# Patient Record
Sex: Female | Born: 1940 | Race: White | Hispanic: No | Marital: Married | Smoking: Former smoker
Health system: Southern US, Community
[De-identification: ages and names within clinical notes are randomized; demographics above are authoritative.]

## PROBLEM LIST (undated history)

## (undated) DIAGNOSIS — Z85819 Personal history of malignant neoplasm of unspecified site of lip, oral cavity, and pharynx: Secondary | ICD-10-CM

## (undated) DIAGNOSIS — T7840XA Allergy, unspecified, initial encounter: Secondary | ICD-10-CM

## (undated) DIAGNOSIS — M1710 Unilateral primary osteoarthritis, unspecified knee: Secondary | ICD-10-CM

## (undated) DIAGNOSIS — M1990 Unspecified osteoarthritis, unspecified site: Secondary | ICD-10-CM

## (undated) DIAGNOSIS — Z860100 Personal history of colon polyps, unspecified: Secondary | ICD-10-CM

## (undated) DIAGNOSIS — M179 Osteoarthritis of knee, unspecified: Secondary | ICD-10-CM

## (undated) DIAGNOSIS — Z8619 Personal history of other infectious and parasitic diseases: Secondary | ICD-10-CM

## (undated) DIAGNOSIS — C801 Malignant (primary) neoplasm, unspecified: Secondary | ICD-10-CM

## (undated) HISTORY — DX: Personal history of malignant neoplasm of unspecified site of lip, oral cavity, and pharynx: Z85.819

## (undated) HISTORY — DX: Personal history of other infectious and parasitic diseases: Z86.19

## (undated) HISTORY — DX: Unspecified osteoarthritis, unspecified site: M19.90

## (undated) HISTORY — PX: EYE SURGERY: SHX253

## (undated) HISTORY — DX: Personal history of colonic polyps: Z86.010

## (undated) HISTORY — PX: APPENDECTOMY: SHX54

## (undated) HISTORY — PX: TUBAL LIGATION: SHX77

## (undated) HISTORY — PX: DILATION AND CURETTAGE OF UTERUS: SHX78

## (undated) HISTORY — PX: JOINT REPLACEMENT: SHX530

## (undated) HISTORY — PX: KNEE ARTHROSCOPY W/ MENISCAL REPAIR: SHX1877

## (undated) HISTORY — PX: FRACTURE SURGERY: SHX138

## (undated) HISTORY — DX: Allergy, unspecified, initial encounter: T78.40XA

## (undated) HISTORY — DX: Personal history of colon polyps, unspecified: Z86.0100

---

## 1999-02-09 ENCOUNTER — Other Ambulatory Visit: Admission: RE | Admit: 1999-02-09 | Discharge: 1999-02-09 | Payer: Self-pay | Admitting: Obstetrics and Gynecology

## 2000-02-11 ENCOUNTER — Encounter: Payer: Self-pay | Admitting: Obstetrics and Gynecology

## 2000-02-11 ENCOUNTER — Encounter: Admission: RE | Admit: 2000-02-11 | Discharge: 2000-02-11 | Payer: Self-pay | Admitting: Obstetrics and Gynecology

## 2001-03-05 ENCOUNTER — Encounter: Admission: RE | Admit: 2001-03-05 | Discharge: 2001-03-05 | Payer: Self-pay | Admitting: Obstetrics and Gynecology

## 2001-03-05 ENCOUNTER — Encounter: Payer: Self-pay | Admitting: Obstetrics and Gynecology

## 2001-03-05 ENCOUNTER — Other Ambulatory Visit: Admission: RE | Admit: 2001-03-05 | Discharge: 2001-03-05 | Payer: Self-pay | Admitting: Obstetrics and Gynecology

## 2002-03-24 ENCOUNTER — Encounter: Payer: Self-pay | Admitting: Obstetrics and Gynecology

## 2002-03-24 ENCOUNTER — Other Ambulatory Visit: Admission: RE | Admit: 2002-03-24 | Discharge: 2002-03-24 | Payer: Self-pay | Admitting: Obstetrics and Gynecology

## 2002-03-24 ENCOUNTER — Encounter: Admission: RE | Admit: 2002-03-24 | Discharge: 2002-03-24 | Payer: Self-pay | Admitting: Obstetrics and Gynecology

## 2002-03-29 ENCOUNTER — Encounter: Admission: RE | Admit: 2002-03-29 | Discharge: 2002-03-29 | Payer: Self-pay | Admitting: Obstetrics and Gynecology

## 2002-03-29 ENCOUNTER — Encounter: Payer: Self-pay | Admitting: Obstetrics and Gynecology

## 2003-04-06 ENCOUNTER — Encounter: Payer: Self-pay | Admitting: Obstetrics and Gynecology

## 2003-04-06 ENCOUNTER — Encounter: Admission: RE | Admit: 2003-04-06 | Discharge: 2003-04-06 | Payer: Self-pay | Admitting: Obstetrics and Gynecology

## 2003-04-06 ENCOUNTER — Other Ambulatory Visit: Admission: RE | Admit: 2003-04-06 | Discharge: 2003-04-06 | Payer: Self-pay | Admitting: Obstetrics and Gynecology

## 2004-04-19 ENCOUNTER — Encounter: Admission: RE | Admit: 2004-04-19 | Discharge: 2004-04-19 | Payer: Self-pay | Admitting: Obstetrics and Gynecology

## 2004-06-08 ENCOUNTER — Encounter: Admission: RE | Admit: 2004-06-08 | Discharge: 2004-06-08 | Payer: Self-pay | Admitting: Obstetrics and Gynecology

## 2005-04-26 ENCOUNTER — Encounter: Admission: RE | Admit: 2005-04-26 | Discharge: 2005-04-26 | Payer: Self-pay | Admitting: Obstetrics and Gynecology

## 2006-05-02 ENCOUNTER — Encounter: Admission: RE | Admit: 2006-05-02 | Discharge: 2006-05-02 | Payer: Self-pay | Admitting: Obstetrics and Gynecology

## 2007-05-19 ENCOUNTER — Encounter: Admission: RE | Admit: 2007-05-19 | Discharge: 2007-05-19 | Payer: Self-pay | Admitting: Obstetrics and Gynecology

## 2008-05-25 ENCOUNTER — Encounter: Admission: RE | Admit: 2008-05-25 | Discharge: 2008-05-25 | Payer: Self-pay | Admitting: Obstetrics and Gynecology

## 2008-06-10 ENCOUNTER — Encounter: Admission: RE | Admit: 2008-06-10 | Discharge: 2008-06-10 | Payer: Self-pay | Admitting: Obstetrics and Gynecology

## 2009-06-09 ENCOUNTER — Encounter: Admission: RE | Admit: 2009-06-09 | Discharge: 2009-06-09 | Payer: Self-pay | Admitting: Obstetrics and Gynecology

## 2010-06-12 ENCOUNTER — Encounter: Admission: RE | Admit: 2010-06-12 | Discharge: 2010-06-12 | Payer: Self-pay | Admitting: Obstetrics and Gynecology

## 2010-06-15 ENCOUNTER — Ambulatory Visit (HOSPITAL_COMMUNITY): Admission: RE | Admit: 2010-06-15 | Discharge: 2010-06-15 | Payer: Self-pay | Admitting: Emergency Medicine

## 2010-11-26 ENCOUNTER — Other Ambulatory Visit: Payer: Self-pay

## 2010-11-26 DIAGNOSIS — D499 Neoplasm of unspecified behavior of unspecified site: Secondary | ICD-10-CM

## 2010-11-28 ENCOUNTER — Ambulatory Visit
Admission: RE | Admit: 2010-11-28 | Discharge: 2010-11-28 | Disposition: A | Discharge status: Home / Self Care | Payer: Medicare Other | Source: Ambulatory Visit

## 2010-11-28 DIAGNOSIS — D499 Neoplasm of unspecified behavior of unspecified site: Secondary | ICD-10-CM

## 2010-11-28 MED ORDER — IOHEXOL 300 MG/ML  SOLN
75.0000 mL | Freq: Once | INTRAMUSCULAR | Status: AC | PRN
  Administered 2010-11-28: 75 mL via INTRAVENOUS

## 2010-12-06 HISTORY — PX: EXCISION OF ORAL TUMOR: SHX6269

## 2010-12-18 ENCOUNTER — Encounter (HOSPITAL_COMMUNITY)
Admission: RE | Admit: 2010-12-18 | Discharge: 2010-12-18 | Disposition: A | Discharge status: Home / Self Care | Payer: Medicare Other | Source: Ambulatory Visit | Attending: Otolaryngology | Admitting: Otolaryngology

## 2010-12-18 DIAGNOSIS — Z01812 Encounter for preprocedural laboratory examination: Secondary | ICD-10-CM | POA: Insufficient documentation

## 2010-12-18 LAB — BASIC METABOLIC PANEL
BUN: 13 mg/dL (ref 6–23)
Chloride: 103 mEq/L (ref 96–112)
GFR calc Af Amer: >60 mL/min (ref >60)
GFR calc non Af Amer: >60 mL/min (ref >60)
Potassium: 4.1 mEq/L (ref 3.5–5.1)

## 2010-12-18 LAB — CBC
HCT: 40.8 % (ref 36.0–46.0)
Hemoglobin: 13.7 g/dL (ref 12.0–15.0)
MCHC: 33.6 g/dL (ref 30.0–36.0)
MCV: 87.6 fL (ref 78.0–100.0)

## 2010-12-18 LAB — SURGICAL PCR SCREEN: Staphylococcus aureus: NEGATIVE

## 2010-12-20 LAB — POCT I-STAT, CHEM 8
Calcium, Ion: 1.19 mmol/L (ref 1.12–1.32)
Creatinine, Ser: 0.8 mg/dL (ref 0.4–1.2)
HCT: 49 % — ABNORMAL HIGH (ref 36.0–46.0)
Potassium: 3.4 mEq/L — ABNORMAL LOW (ref 3.5–5.1)
Sodium: 139 mEq/L (ref 135–145)
TCO2: 29 mmol/L (ref 0–100)

## 2010-12-26 ENCOUNTER — Observation Stay (HOSPITAL_COMMUNITY)
Admission: RE | Admit: 2010-12-26 | Discharge: 2010-12-27 | Disposition: A | Discharge status: Home / Self Care | Payer: Medicare Other | Source: Ambulatory Visit | Attending: Otolaryngology | Admitting: Otolaryngology

## 2010-12-26 ENCOUNTER — Other Ambulatory Visit: Payer: Self-pay | Admitting: Otolaryngology

## 2010-12-26 DIAGNOSIS — Z87891 Personal history of nicotine dependence: Secondary | ICD-10-CM | POA: Insufficient documentation

## 2010-12-26 DIAGNOSIS — Z01812 Encounter for preprocedural laboratory examination: Secondary | ICD-10-CM | POA: Insufficient documentation

## 2010-12-26 DIAGNOSIS — C030 Malignant neoplasm of upper gum: Principal | ICD-10-CM | POA: Insufficient documentation

## 2011-01-04 ENCOUNTER — Observation Stay (HOSPITAL_COMMUNITY)
Admission: RE | Admit: 2011-01-04 | Discharge: 2011-01-04 | Disposition: A | Discharge status: Home / Self Care | Payer: Medicare Other | Source: Ambulatory Visit | Attending: Otolaryngology | Admitting: Otolaryngology

## 2011-01-04 ENCOUNTER — Other Ambulatory Visit: Payer: Self-pay | Admitting: Otolaryngology

## 2011-01-04 DIAGNOSIS — Z87891 Personal history of nicotine dependence: Secondary | ICD-10-CM | POA: Insufficient documentation

## 2011-01-04 DIAGNOSIS — Z01812 Encounter for preprocedural laboratory examination: Secondary | ICD-10-CM | POA: Insufficient documentation

## 2011-01-04 DIAGNOSIS — C030 Malignant neoplasm of upper gum: Principal | ICD-10-CM | POA: Insufficient documentation

## 2011-01-04 LAB — CBC
HCT: 38.9 % (ref 36.0–46.0)
Hemoglobin: 13.2 g/dL (ref 12.0–15.0)
Platelets: 161 10*3/uL (ref 150–400)
RBC: 4.41 MIL/uL (ref 3.87–5.11)
RDW: 12.6 % (ref 11.5–15.5)

## 2011-02-04 NOTE — Op Note (Signed)
[name], [name]                ACCOUNT NO.:  [id number]  MEDICAL RECORD NO.:  [id number]           PATIENT TYPE:  O  LOCATION:  SDSC                         FACILITY:  [facility]  PHYSICIAN:  Kinnie Scales. Annalee Genta, M.D.DATE OF BIRTH:  20-Oct-1940  DATE OF PROCEDURE:  01/04/2011 DATE OF DISCHARGE:                              OPERATIVE REPORT   PREOPERATIVE DIAGNOSES: 1. Right maxillary alveolar carcinoma. 2. Status post incomplete excision.  POSTOPERATIVE DIAGNOSES: 1. Right maxillary alveolar carcinoma. 2. Status post incomplete excision.  INDICATION FOR SURGERY: 1. Right maxillary alveolar carcinoma. 2. Status post incomplete excision.  SURGICAL PROCEDURE:  Excision, right alveolar carcinoma.  ANESTHESIA:  General endotracheal.  COMPLICATIONS:  None.  ESTIMATED BLOOD LOSS:  Approximately 50 mL.  The patient was transferred from the operating room to the recovery room in stable condition.  BRIEF HISTORY:  The patient is an otherwise healthy 70 year old white female who is a nonsmoker who noted a soft tissue mass involving the right maxillary alveolus underlying her upper denture.  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  Concerns were raised regarding possible squamous cell carcinoma and given her history and physical findings, CT scan was obtained, which showed no evidence of bone erosion.  I recommended to undertake excision of the mass under general anesthesia, which was performed on December 26, 2010.  The patient was stable and doing well postoperatively.  The pathology for resected tissue showed positive margins and given her history and examination with a final diagnosis of squamous cell carcinoma, I recommended that we undertake re-excision of the previous resection site under general anesthesia.  The risks, benefits, and possible complications of the procedure were discussed in detail with the patient and her husband. They understood and concurred to  our plan for surgery, which was scheduled on January 04, 2011.  PROCEDURE:  The patient was brought to the operating room at [facility], placed in supine position on the operating table.  General endotracheal anesthesia was established without difficulty.  When the patient was adequately anesthetized, she was positioned on the operating table, and prepped and draped in the sterile fashion.  Oral cavity was examined and the previous excision site was carefully visualized.  There was early healing after approximately 9 days.  There was no evidence of additional ulcer or tumor at the margins on gross visual inspection. Wide excision around the area was then undertaken with approximately 1 cm margins around the entire excision site.  The margins were then sent to Pathology for frozen section analysis, which was negative for any evidence of carcinoma.  The patient's wound was thoroughly irrigated and hemostasis was obtained with Bovie electrocautery.  The previously elevated buccal flap was taken down and readvanced and sutured into position with 4-0 Vicryl suture in interrupted horizontal mattress fashion as well as interrupted simple sutures along the margin.  The patient's oral cavity was then irrigated and suctioned.  Orogastric tube was passed.  She was awakened from anesthetic, extubated, and then transferred from the operating room to the recovery room in stable condition.  There were no complications and blood loss  was approximately 50 mL.          ______________________________ Kinnie Scales. Annalee Genta, M.D.     DLS/MEDQ  D:  04/54/0981  T:  01/04/2011  Job:  [id number]  Electronically Signed by Osborn Coho M.D. on 02/04/2011 10:18:09 AM

## 2011-02-04 NOTE — Op Note (Signed)
NAMEBENNIE, [name]                ACCOUNT NO.:  [id number]  MEDICAL RECORD NO.:  [id number]           PATIENT TYPE:  O  LOCATION:  5114                         FACILITY:  [facility]  PHYSICIAN:  Kinnie Scales. Annalee Genta, M.D.DATE OF BIRTH:  31-May-1941  DATE OF PROCEDURE:  12/26/2010 DATE OF DISCHARGE:                              OPERATIVE REPORT   REFERRING PHYSICIAN:  Gwendlyn Deutscher, DDS  LOCATION:  [facility] OR.  PREOPERATIVE DIAGNOSIS:  Right maxillary alveolar lesion.  POSTOPERATIVE DIAGNOSIS:  Right maxillary alveolar lesion.  INDICATIONS FOR SURGERY:  Right maxillary alveolar lesion.  SURGICAL PROCEDURES: 1. A 2 x 4-cm soft tissue resection of alveolar lesion. 2. Right maxillary alveoloplasty. 3. Local buccal rotation flap.  SURGEON:  Kinnie Scales. Annalee Genta, MD  ANESTHESIA:  General.  COMPLICATIONS:  None.  ESTIMATED BLOOD LOSS:  Minimal.  The patient was transferred from the operating room to recovery room in stable condition.  BRIEF HISTORY:  The patient is a 70 year old white female referred by Dr. Manson Passey on the Oral Surgery Service for evaluation of a soft tissue lesion involving the right maxillary alveolar mucosa.  The patient had undergone complete dental extraction and wore an upper and lower denture plate.  Under the upper denture along the alveolar ridge, the patient was noted to have a 1-cm ulcerated-appearing mass.  Biopsy of the mass was consistent with dysplastic changes.  No evidence of invasive carcinoma.  Given the patient's history, examination, and physical findings, CT scan of the area was obtained.  There is no evidence of bone erosion and no lymphadenopathy.  Given her history and findings, I recommended wide local excision of the lesion under anesthesia with reconstruction.  The risks, benefits, and possible complications of the procedure were discussed in detail with the patient and her family. They understood and concurred with our  plan for surgery which was scheduled at [facility] OR with intended overnight observation.  PROCEDURE:  The patient was brought to the operating room on December 26, 2010, placed in supine position on the operating table.  General endotracheal anesthesia was established without difficulty.  When the patient was adequately anesthetized, she was positioned on the operating table and prepped and draped in sterile fashion.  She was injected with local anesthetic consisting of 1% lidocaine and 1:100,000 solution of epinephrine which was injected in the oral cavity at the proposed excision site as well as in the right postauricular crease with intention of a possible full-thickness skin graft harvested from the postauricular sulcus.  The patient was then prepped and draped in a sterile fashion, positioned, and surgery proceeded.  With the patient prepared for surgery, the oral cavity was inspected. An area of 0.5-cm demarcation around the mucosal lesion was carefully inscribed in the mucosa.  Then using Bovie electrocautery, resection was undertaken creating an approximately 2 x 4-cm defect along the right alveolus.  The overlying soft tissue, including the lesion, was then resected in its entirety and marked and then sent to Pathology for gross microscopic evaluation.  The underlying alveolus was then carefully inspected.  There was an area of bony  dehiscence with intact underlying maxillary sinus mucosa.  Using a diamond bur on a dental drill, an alveoloplasty was then performed.  The alveolar ridge was taken down from anterior to posterior in order to allow closure and to remove bone underlying the lesion to reduce the risk of possible recurrent tumor. With the bony ridge removed, a Kerrison rongeur was then used to remove a small amount of underlying the bone adjacent to the dehiscing aspect of the maxilla.  There is no evidence of bone erosion or abnormal bony tissue in this  area but given the overlying lesion, I opted to remove an additional portion of bone.  With the wound thoroughly dissected and area of concern addressed, a local regional flap was then carefully created, the periosteum overlying the alveolus was elevated with a Therapist, nutritional on the lateral superior buccal wall at the buccal sulcus.  A soft tissue flap was developed using Bovie electrocautery and blunt and sharp dissection.  The flap was then rotated into position with closure beginning with interrupted 3-0 Vicryl suture on a tapered needle to create horizontal mattressing sutures to approximate the medial alveolar mucosa to the buccal flap.  The skin edges were then closed with interrupted 3-0 Vicryl suture along the same closure line creating excellent closure of the defect as well as protecting the area of maxillary bony dehiscence.  The patient's oral cavity and oropharynx were irrigated and suctioned.  Orogastric tube was passed, stomach contents aspirated.  She was then awakened from her anesthetic, extubated, and was transferred from the operating room to recovery room in stable condition.  There were no complications and blood loss was minimal.          ______________________________ Kinnie Scales. Annalee Genta, M.D.     DLS/MEDQ  D:  95/62/1308  T:  12/27/2010  Job:  [id number]  cc:   Grant Ruts., D.D.S.  Electronically Signed by Osborn Coho M.D. on 02/04/2011 10:17:37 AM

## 2011-05-20 ENCOUNTER — Other Ambulatory Visit: Payer: Self-pay | Admitting: Obstetrics and Gynecology

## 2011-05-20 DIAGNOSIS — Z1231 Encounter for screening mammogram for malignant neoplasm of breast: Secondary | ICD-10-CM

## 2011-06-21 ENCOUNTER — Ambulatory Visit
Admission: RE | Admit: 2011-06-21 | Discharge: 2011-06-21 | Disposition: A | Discharge status: Home / Self Care | Payer: Medicare Other | Source: Ambulatory Visit | Attending: Obstetrics and Gynecology | Admitting: Obstetrics and Gynecology

## 2011-06-21 DIAGNOSIS — Z1231 Encounter for screening mammogram for malignant neoplasm of breast: Secondary | ICD-10-CM

## 2011-06-28 NOTE — Discharge Summary (Signed)
  NAMEVILMA, [name]                ACCOUNT NO.:  [id number]  MEDICAL RECORD NO.:  [id number]  LOCATION:  5125                         FACILITY:  [facility]  PHYSICIAN:  Kinnie Scales. Annalee Genta, M.D.DATE OF BIRTH:  [birth year]  DATE OF ADMISSION:  01/04/2011 DATE OF DISCHARGE:  01/04/2011                              DISCHARGE SUMMARY   ADMISSION DIAGNOSIS:  Right maxillary alveolar squamous cell carcinoma.  DISCHARGE DIAGNOSIS:  Right maxillary alveolar squamous cell carcinoma.  SURGICAL PROCEDURE:  Re-excision of right maxillary alveolar squamous cell carcinoma.  CONDITION ON DISCHARGE:  The patient is discharged home in stable condition.  DISCHARGE MEDICATIONS:  As indicated.  FOLLOWUP:  The patient [name] follow up as an outpatient in 10 days for recheck.  BRIEF HISTORY:  The patient is a 70 year old female who underwent excision of a right maxillary alveolar mass on December 26, 2010. Pathology for resected tissue showed invasive squamous cell carcinoma and there were concerns regarding positive margins.  Given the patient's history and examination, I recommended repeating the surgical excision with wider margins and local reconstruction.  Risks, benefits, and possible complications of these procedures were discussed and the plan was to perform the surgery, observe the patient overnight, and discharge the following day.  HOSPITAL COURSE:  The patient was admitted to the ENT service under Dr. Thurmon Fair care on December 25, 2010, taken to the operating room and a wide local excision of the previous surgical site was undertaken.  She tolerated the surgical procedure without difficulty and was transferred from the operating room to the recovery room and from the recovery to unit 5100 for postoperative care.  The patient was stable on the first postoperative morning tolerating oral diet and medications and was discharged home in stable condition.  No complications.  No blood loss. The  patient was stable.          ______________________________ Kinnie Scales. Annalee Genta, M.D.     DLS/MEDQ  D:  16/07/9603  T:  04/30/2011  Job:  [id number]  Electronically Signed by Osborn Coho M.D. on 06/28/2011 12:05:05 PM

## 2011-09-16 ENCOUNTER — Ambulatory Visit (INDEPENDENT_AMBULATORY_CARE_PROVIDER_SITE_OTHER): Payer: Medicare Other

## 2011-09-16 DIAGNOSIS — R059 Cough, unspecified: Secondary | ICD-10-CM

## 2011-09-16 DIAGNOSIS — R05 Cough: Secondary | ICD-10-CM

## 2011-09-16 DIAGNOSIS — J40 Bronchitis, not specified as acute or chronic: Secondary | ICD-10-CM

## 2011-09-23 ENCOUNTER — Ambulatory Visit (INDEPENDENT_AMBULATORY_CARE_PROVIDER_SITE_OTHER): Payer: Medicare Other

## 2011-09-23 DIAGNOSIS — J40 Bronchitis, not specified as acute or chronic: Secondary | ICD-10-CM

## 2011-09-23 DIAGNOSIS — J329 Chronic sinusitis, unspecified: Secondary | ICD-10-CM

## 2011-09-23 DIAGNOSIS — H6120 Impacted cerumen, unspecified ear: Secondary | ICD-10-CM

## 2011-12-14 ENCOUNTER — Ambulatory Visit (INDEPENDENT_AMBULATORY_CARE_PROVIDER_SITE_OTHER): Payer: Medicare Other | Admitting: Family Medicine

## 2011-12-14 VITALS — BP 133/78 | HR 73 | Temp 98.1°F | Resp 16 | Ht 61.5 in | Wt 168.0 lb

## 2011-12-14 DIAGNOSIS — R05 Cough: Secondary | ICD-10-CM

## 2011-12-14 DIAGNOSIS — R059 Cough, unspecified: Secondary | ICD-10-CM

## 2011-12-14 MED ORDER — PREDNISONE 20 MG PO TABS: ORAL_TABLET | ORAL | Status: AC

## 2011-12-14 MED ORDER — HYDROCODONE-HOMATROPINE 5-1.5 MG/5ML PO SYRP: 5.0000 mL | ORAL_SOLUTION | Freq: Three times a day (TID) | ORAL | Status: AC | PRN

## 2011-12-14 NOTE — Progress Notes (Signed)
  Subjective:    Patient ID: [name], female    DOB: 1941-05-11, 71 y.o.   MRN: [id number]  HPI 71 yo female presenting with cough for 1 week.  Worsening.  Dry.  Feels tickly throat.  Some nasal discharge/stuffiness - clear.  No ear pain, no fever.  Cough same day and night.  Does keep her awake some.     Review of Systems    Negative except as per HPI  Objective:   Physical Exam  Constitutional: She appears well-developed. No distress.  HENT:  Right Ear: Tympanic membrane, external ear and ear canal normal. Tympanic membrane is not injected, not scarred, not perforated, not erythematous, not retracted and not bulging.  Left Ear: Tympanic membrane, external ear and ear canal normal. Tympanic membrane is not injected, not scarred, not perforated, not erythematous, not retracted and not bulging.  Nose: No mucosal edema or rhinorrhea. Right sinus exhibits no maxillary sinus tenderness and no frontal sinus tenderness. Left sinus exhibits no maxillary sinus tenderness and no frontal sinus tenderness.  Mouth/Throat: Uvula is midline, oropharynx is clear and moist and mucous membranes are normal. No oropharyngeal exudate or tonsillar abscesses.  Cardiovascular: Normal rate, regular rhythm, normal heart sounds and intact distal pulses.   No murmur heard. Pulmonary/Chest: Effort normal and breath sounds normal. No respiratory distress. She has no wheezes. She has no rales.  Lymphadenopathy:       Head (right side): No submandibular and no preauricular adenopathy present.       Head (left side): No submandibular and no preauricular adenopathy present.       Right cervical: No superficial cervical and no posterior cervical adenopathy present.      Left cervical: No superficial cervical and no posterior cervical adenopathy present.       Right: No supraclavicular adenopathy present.       Left: No supraclavicular adenopathy present.  Skin: Skin is warm and dry.          Assessment &  Plan:  Cough/early bronchitis - pred taper and hycodan.

## 2011-12-22 ENCOUNTER — Ambulatory Visit (INDEPENDENT_AMBULATORY_CARE_PROVIDER_SITE_OTHER): Payer: Medicare Other | Admitting: Family Medicine

## 2011-12-22 VITALS — BP 142/77 | HR 79 | Temp 98.0°F | Resp 16 | Ht 61.5 in | Wt 168.0 lb

## 2011-12-22 DIAGNOSIS — J309 Allergic rhinitis, unspecified: Secondary | ICD-10-CM | POA: Insufficient documentation

## 2011-12-22 DIAGNOSIS — J3489 Other specified disorders of nose and nasal sinuses: Secondary | ICD-10-CM

## 2011-12-22 DIAGNOSIS — R0981 Nasal congestion: Secondary | ICD-10-CM

## 2011-12-22 DIAGNOSIS — N393 Stress incontinence (female) (male): Secondary | ICD-10-CM

## 2011-12-22 MED ORDER — MOMETASONE FURO-FORMOTEROL FUM 200-5 MCG/ACT IN AERO: 2.0000 | INHALATION_SPRAY | Freq: Two times a day (BID) | RESPIRATORY_TRACT | Status: DC

## 2011-12-22 MED ORDER — ESOMEPRAZOLE MAGNESIUM 40 MG PO CPDR: 40.0000 mg | DELAYED_RELEASE_CAPSULE | Freq: Every day | ORAL | Status: DC

## 2011-12-22 MED ORDER — HYDROCODONE-HOMATROPINE 5-1.5 MG/5ML PO SYRP: 5.0000 mL | ORAL_SOLUTION | Freq: Three times a day (TID) | ORAL | Status: DC | PRN

## 2011-12-22 NOTE — Progress Notes (Signed)
71 yo woman with recurrent dry hacky cough last December, and again last week.  It improved a bit with the prednisone (which keeps her awake), but the symptoms are coming back again.  Cough is nonproductive.  No wheezing or h/o asthma.  Remote h/o smoking many years ago.  1 year ago she had an oral cancer removed last March 2012 (Dr. 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   HEENT:  Edentulous upper, normal post pharynx Neck:  No adenopathy or thyromeg, supple Chest:  Clear to auscultation   A: persistent cough  P:  Nexium 40 qd x 7 Dulera 200 mg bid Hydromet q6 h prn

## 2011-12-24 ENCOUNTER — Other Ambulatory Visit: Payer: Self-pay

## 2011-12-24 MED ORDER — ESOMEPRAZOLE MAGNESIUM 40 MG PO CPDR: 40.0000 mg | DELAYED_RELEASE_CAPSULE | Freq: Every day | ORAL | Status: DC

## 2011-12-27 ENCOUNTER — Telehealth: Payer: Self-pay

## 2011-12-27 NOTE — Telephone Encounter (Signed)
Pt states she has white spots in her mouth. Is it from the medicines?  Please call

## 2011-12-28 ENCOUNTER — Ambulatory Visit (INDEPENDENT_AMBULATORY_CARE_PROVIDER_SITE_OTHER): Payer: Medicare Other | Admitting: Family Medicine

## 2011-12-28 VITALS — BP 157/81 | HR 82 | Temp 98.6°F | Resp 20 | Ht 61.5 in | Wt 170.0 lb

## 2011-12-28 DIAGNOSIS — B370 Candidal stomatitis: Secondary | ICD-10-CM

## 2011-12-28 MED ORDER — NYSTATIN 100000 UNIT/ML MT SUSP: 500000.0000 [IU] | Freq: Four times a day (QID) | OROMUCOSAL | Status: AC

## 2011-12-28 NOTE — Progress Notes (Signed)
  Subjective:    Patient ID: [name], female    DOB: 1941/05/20, 71 y.o.   MRN: [id number]  HPI 71yo female with mouth sores. Thursday felt rough, yesterday noticed white spots.  They are sore.  No pain to swallow.  Using dulera BID but not rinsing mouth.    Seen 3/9 and 3/17 for cough.  First visit given pred taper and hycodan.  Second given dulera and hydromet. Also, Nexium.  Cough is better   Review of Systems Negative except as per HPI     Objective:   Physical Exam  Constitutional: She appears well-developed.  Pulmonary/Chest: Effort normal.  Neurological: She is alert.   Tongue and oral mucosa with white plaques.  Can be scraped with tongue depressor.        Assessment & Plan:  Thrush from inhaled corticosteroid.  Nystation solution.  Rinse and spit after dulera use.

## 2011-12-28 NOTE — Telephone Encounter (Signed)
PT CAME IN TO BE SEEN °

## 2011-12-31 ENCOUNTER — Other Ambulatory Visit: Payer: Self-pay | Admitting: Family Medicine

## 2011-12-31 NOTE — Telephone Encounter (Signed)
Rx called into [facility] in [place], [place]

## 2011-12-31 NOTE — Telephone Encounter (Signed)
Pts husband called stating pt is not getting any better with her bronchitis. States every time she finishes her cough syrup (hydrocodone) the cough comes back. States she coughs a lot at night. States he's called [facility] multiple times today to get a refill sent to [place] and doesn't believe they are sending it. I explained our refill policy to pts husband based on our flow of pts and he states she needs something today. I offered our hours for re-evaluation if shes not getting any better.   [facility] in [place] on [address]  [phone]    [phone]  bf

## 2012-04-30 ENCOUNTER — Encounter: Payer: Self-pay | Admitting: Emergency Medicine

## 2012-05-13 ENCOUNTER — Other Ambulatory Visit: Payer: Self-pay | Admitting: Obstetrics and Gynecology

## 2012-05-13 DIAGNOSIS — Z1231 Encounter for screening mammogram for malignant neoplasm of breast: Secondary | ICD-10-CM

## 2012-05-13 DIAGNOSIS — Z780 Asymptomatic menopausal state: Secondary | ICD-10-CM

## 2012-05-23 ENCOUNTER — Ambulatory Visit (INDEPENDENT_AMBULATORY_CARE_PROVIDER_SITE_OTHER): Payer: Medicare Other | Admitting: Emergency Medicine

## 2012-05-23 VITALS — BP 154/80 | HR 67 | Temp 97.5°F | Resp 18 | Ht 61.75 in | Wt 170.6 lb

## 2012-05-23 DIAGNOSIS — IMO0001 Reserved for inherently not codable concepts without codable children: Secondary | ICD-10-CM

## 2012-05-23 DIAGNOSIS — K219 Gastro-esophageal reflux disease without esophagitis: Secondary | ICD-10-CM

## 2012-05-23 DIAGNOSIS — J309 Allergic rhinitis, unspecified: Secondary | ICD-10-CM

## 2012-05-23 DIAGNOSIS — N3281 Overactive bladder: Secondary | ICD-10-CM

## 2012-05-23 DIAGNOSIS — Z9109 Other allergy status, other than to drugs and biological substances: Secondary | ICD-10-CM

## 2012-05-23 DIAGNOSIS — N318 Other neuromuscular dysfunction of bladder: Secondary | ICD-10-CM

## 2012-05-23 DIAGNOSIS — B351 Tinea unguium: Secondary | ICD-10-CM

## 2012-05-23 LAB — POCT CBC
Granulocyte percent: 56.7 %G (ref 37–80)
MID (cbc): 0.4 (ref 0–0.9)
MPV: 8.1 fL (ref 0–99.8)
POC MID %: 7.3 %M (ref 0–12)
Platelet Count, POC: 211 10*3/uL (ref 142–424)
RBC: 4.95 M/uL (ref 4.04–5.48)

## 2012-05-23 LAB — LIPID PANEL
HDL: 54 mg/dL (ref >39)
LDL Cholesterol: 140 mg/dL — ABNORMAL HIGH (ref 0–99)

## 2012-05-23 LAB — COMPREHENSIVE METABOLIC PANEL
ALT: 24 U/L (ref 0–35)
AST: 22 U/L (ref 0–37)
Albumin: 4.7 g/dL (ref 3.5–5.2)
Calcium: 10.2 mg/dL (ref 8.4–10.5)
Chloride: 102 mEq/L (ref 96–112)
Creat: 0.67 mg/dL (ref 0.50–1.10)
Potassium: 4.1 mEq/L (ref 3.5–5.3)
Sodium: 140 mEq/L (ref 135–145)
Total Protein: 7.3 g/dL (ref 6.0–8.3)

## 2012-05-23 MED ORDER — CICLOPIROX 8 % EX SOLN: Freq: Every day | CUTANEOUS | Status: AC

## 2012-05-23 NOTE — Progress Notes (Signed)
  Subjective:    Patient ID: [name], female    DOB: 11/20/1940, 71 y.o.   MRN: [id number]  HPI patient enters with problems with both her great toenail is ingrown on the past few months. History this patient is a history of alveolar carcinoma. She'll surgery by Dr. Annalee Genta and has been having regular checks for this. He underwent colonoscopy by Dr. Audley Hose in 2011 and from the report it appears she needs repeat colonoscopy in 5 years.    Review of Systems     Objective:   Physical Exam  Constitutional: She appears well-developed.  HENT:  Head: Atraumatic.  Eyes: Pupils are equal, round, and reactive to light.  Neck: No thyromegaly present.  Cardiovascular: Normal rate and regular rhythm.   Pulmonary/Chest: Effort normal and breath sounds normal. No respiratory distress. She has no wheezes.  Abdominal: Soft. She exhibits no distension. There is no tenderness. There is no rebound.   Results for orders placed in visit on 05/23/12  POCT SKIN KOH      Component Value Range   Skin KOH, POC Negative    POCT CBC      Component Value Range   WBC 5.8  4.6 - 10.2 K/uL   Lymph, poc 2.1  0.6 - 3.4   POC LYMPH PERCENT 36.0  10 - 50 %L   MID (cbc) 0.4  0 - 0.9   POC MID % 7.3  0 - 12 %M   POC Granulocyte 3.3  2 - 6.9   Granulocyte percent 56.7  37 - 80 %G   RBC 4.95  4.04 - 5.48 M/uL   Hemoglobin 14.4  12.2 - 16.2 g/dL   HCT, POC 95.2  84.1 - 47.9 %   MCV 94.1  80 - 97 fL   MCH, POC 29.1  27 - 31.2 pg   MCHC 30.9 (*) 31.8 - 35.4 g/dL   RDW, POC 32.4     Platelet Count, POC 211  142 - 424 K/uL   MPV 8.1  0 - 99.8 fL         Assessment & Plan:    Will  Go ahead and check routine labs. No change in meds.

## 2012-05-23 NOTE — Addendum Note (Signed)
Addended by: Lesle Chris A on: 05/23/2012 08:49 AM   Modules accepted: Orders

## 2012-05-23 NOTE — Progress Notes (Signed)
  Subjective:    Patient ID: [name], female    DOB: April 07, 1941, 71 y.o.   MRN: [id number]  HPI patient has noticed over the last few weeks discoloration of both of her great toenails. She also is noticing that she appears to be losing nail itself.    Review of Systems     Objective:   Physical Exam physical exam reveals discoloration over the nails of both great toes. These have the appearance of onychomycosis Results for orders placed in visit on 05/23/12  POCT SKIN KOH      Component Value Range   Skin KOH, POC Negative                   Since I did not see any fungus on KOH I think we should treat this with Penlac she is advised this may take up to a year to resolve.

## 2012-05-23 NOTE — Patient Instructions (Signed)
Ringworm, Nail  A fungal infection of the nail (tinea unguium/onychomycosis) is common. It is common as the visible part of the nail is composed of dead cells which have no blood supply to help prevent infection. It occurs because fungi are everywhere and will pick any opportunity to grow on any dead material.  Because nails are very slow growing they require up to 2 years of treatment with anti-fungal medications. The entire nail back to the base is infected. This includes approximately ? of the nail which you cannot see.  If your caregiver has prescribed a medication by mouth, take it every day and as directed. No progress will be seen for at least 6 to 9 months. Do not be disappointed! Because fungi live on dead cells with little or no exposure to blood supply, medication delivery to the infection is slow; thus the cure is slow. It is also why you can observe no progress in the first 6 months. The nail becoming cured is the base of the nail, as it has the blood supply. Topical medication such as creams and ointments are usually not effective. Important in successful treatment of nail fungus is closely following the medication regimen that your doctor prescribes.  Sometimes you and your caregiver may elect to speed up this process by surgical removal of all the nails. Even this may still require 6 to 9 months of additional oral medications.  See your caregiver as directed. Remember there will be no visible improvement for at least 6 months. See your caregiver sooner if other signs of infection (redness and swelling) develop.  Document Released: 09/20/2000 Document Revised: 09/12/2011 Document Reviewed: 11/29/2008  [employer] Patient Information 2012 [employer].

## 2012-06-22 ENCOUNTER — Ambulatory Visit
Admission: RE | Admit: 2012-06-22 | Discharge: 2012-06-22 | Disposition: A | Discharge status: Home / Self Care | Payer: Medicare Other | Source: Ambulatory Visit | Attending: Obstetrics and Gynecology | Admitting: Obstetrics and Gynecology

## 2012-06-22 ENCOUNTER — Ambulatory Visit: Payer: Medicare Other

## 2012-06-22 DIAGNOSIS — Z780 Asymptomatic menopausal state: Secondary | ICD-10-CM

## 2012-06-22 DIAGNOSIS — Z1231 Encounter for screening mammogram for malignant neoplasm of breast: Secondary | ICD-10-CM

## 2012-09-19 ENCOUNTER — Ambulatory Visit (INDEPENDENT_AMBULATORY_CARE_PROVIDER_SITE_OTHER): Payer: Medicare Other | Admitting: Emergency Medicine

## 2012-09-19 VITALS — BP 111/74 | HR 80 | Temp 98.3°F | Resp 18 | Wt 171.0 lb

## 2012-09-19 DIAGNOSIS — M79609 Pain in unspecified limb: Secondary | ICD-10-CM

## 2012-09-19 DIAGNOSIS — M79676 Pain in unspecified toe(s): Secondary | ICD-10-CM

## 2012-09-19 DIAGNOSIS — E785 Hyperlipidemia, unspecified: Secondary | ICD-10-CM | POA: Insufficient documentation

## 2012-09-19 LAB — LIPID PANEL
Cholesterol: 170 mg/dL (ref 0–200)
LDL Cholesterol: 95 mg/dL (ref 0–99)
VLDL: 26 mg/dL (ref 0–40)

## 2012-09-19 NOTE — Progress Notes (Signed)
  Subjective:    Patient ID: [name], female    DOB: 25-Jul-1941, 71 y.o.   MRN: [id number]  HPI patient here to followup high cholesterol. She's been treating this with diet. She does not 1 take medications for this. She's also asking about phentermine for diet advised her that at her age that would not be a safe medication for her to take. She also has been having some pain in the lateral portion of her right great toe    Review of Systems     Objective:   Physical Exam patient is alert cooperative not in any distress. Examination of the right great toe reveals some mild tenderness laterally. There is no redness. Pulses are 2+ and symmetrical. 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 I do not want to put her on Lamisil since she is 87. I also told her I cannot write for him to me because of her age. She is advised to sign up with [employer] continue her exercise program and we will do a lipid panel today to check on the status of her cholesterol.

## 2012-09-20 ENCOUNTER — Telehealth: Payer: Self-pay

## 2012-09-20 NOTE — Telephone Encounter (Signed)
Pt calling for lab results  Received message yesterday  Best: [phone]  bf

## 2012-09-21 NOTE — Telephone Encounter (Signed)
Lab still trying to contact her.

## 2012-09-22 ENCOUNTER — Encounter: Payer: Self-pay | Admitting: Family Medicine

## 2012-09-22 DIAGNOSIS — Z85819 Personal history of malignant neoplasm of unspecified site of lip, oral cavity, and pharynx: Secondary | ICD-10-CM | POA: Insufficient documentation

## 2012-10-13 ENCOUNTER — Telehealth: Payer: Self-pay

## 2012-10-13 NOTE — Telephone Encounter (Signed)
Last two office notes and labs sent to surgical center thru Epic.

## 2012-10-13 NOTE — Telephone Encounter (Signed)
SYLVIA FROM [facility] STATES THEY NEED LAST LABS/EKG/NOTES FAXED ON PT PLEASE FAX TO [phone] AND THE PHONE NUMBER  IS [phone]

## 2013-01-18 ENCOUNTER — Ambulatory Visit (INDEPENDENT_AMBULATORY_CARE_PROVIDER_SITE_OTHER): Payer: Medicare Other | Admitting: Emergency Medicine

## 2013-01-18 VITALS — BP 151/79 | HR 80 | Temp 98.5°F | Resp 16 | Ht 61.0 in | Wt 173.0 lb

## 2013-01-18 DIAGNOSIS — S8012XA Contusion of left lower leg, initial encounter: Secondary | ICD-10-CM

## 2013-01-18 DIAGNOSIS — S8010XA Contusion of unspecified lower leg, initial encounter: Secondary | ICD-10-CM

## 2013-01-18 NOTE — Progress Notes (Signed)
[facility] [address] [place] [address] [phone]  Date:  01/18/2013   Name:  [name]   DOB:  Dec 10, 1940   MRN:  [id number]  PCP:  Lucilla Edin, MD    Chief Complaint: Rash   History of Present Illness:  [name] is a 72 y.o. very pleasant female patient who presents with the following:  Noticed a contusion on the back of her left lower leg above the ankle.  No recollection of injury.  Denies pain.  Not pruritic.  No fever or chills.  No infestation.  No improvement with over the counter medications or other home remedies. Denies other complaint or health concern today.   Patient Active Problem List  Diagnosis  . 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 Chronic nasal congestion  . Hyperlipidemia  . Carcinoma of nasal cavity    History reviewed. No pertinent past medical history.  Past Surgical History  Procedure Laterality Date  . Appendectomy    . Knee arthroscopy w/ meniscal repair Left     History  Substance Use Topics  . Smoking status: Former Smoker    Quit date: 12/28/1986  . Smokeless tobacco: Not on file  . Alcohol Use: Not on file    No family history on file.  No Known Allergies  Medication list has been reviewed and updated.  Current Outpatient Prescriptions on File Prior to Visit  Medication Sig Dispense Refill  . calcium carbonate 200 MG capsule Take 600 mg by mouth 2 (two) times daily with a meal.      . fluticasone (FLONASE) 50 MCG/ACT nasal spray Place 2 sprays into the nose daily.      . Mometasone Furo-Formoterol Fum 200-5 MCG/ACT AERO Inhale 2 puffs into the lungs 2 (two) times daily.  1 Inhaler  3  . Multiple Vitamins-Minerals (MULTIVITAMIN WITH MINERALS) tablet Take 1 tablet by mouth daily.      Marland Kitchen oxybutynin (DITROPAN) 5 MG tablet Take 5 mg by mouth 3 (three) times daily.      Marland Kitchen HYDROcodone-homatropine (HYCODAN) 5-1.5 MG/5ML syrup TAKE ONE TEASPOONFUL BY MOUTH EVERY 8 HOURS AS NEEDED FOR COUGH   120 mL  0   No current facility-administered medications on file prior to visit.    Review of Systems:  As per HPI, otherwise negative.    Physical Examination: Filed Vitals:   01/18/13 1928  BP: 151/79  Pulse: 80  Temp: 98.5 F (36.9 C)  Resp: 16   Filed Vitals:   01/18/13 1928  Height: 5\' 1"  (1.549 m)  Weight: 173 lb (78.472 kg)   Body mass index is 32.7 kg/(m^2). Ideal Body Weight: Weight in (lb) to have BMI = 25: 132   GEN: WDWN, NAD, Non-toxic, Alert & Oriented x 3 HEENT: Atraumatic, Normocephalic.  Ears and Nose: No external deformity. EXTR: No clubbing/cyanosis/edema NEURO: Normal gait.  PSYCH: Normally interactive. Conversant. Not depressed or anxious appearing.  Calm demeanor.  Left leg:  Contusion posterior lower leg   Assessment and Plan: Contusion leg Elevate Follow up as needed   Signed,  Phillips Odor, MD

## 2013-01-18 NOTE — Patient Instructions (Signed)

## 2013-05-27 ENCOUNTER — Other Ambulatory Visit: Payer: Self-pay

## 2013-05-27 DIAGNOSIS — Z1231 Encounter for screening mammogram for malignant neoplasm of breast: Secondary | ICD-10-CM

## 2013-06-23 ENCOUNTER — Ambulatory Visit
Admission: RE | Admit: 2013-06-23 | Discharge: 2013-06-23 | Disposition: A | Discharge status: Home / Self Care | Payer: Medicare Other | Source: Ambulatory Visit

## 2013-06-23 DIAGNOSIS — Z1231 Encounter for screening mammogram for malignant neoplasm of breast: Secondary | ICD-10-CM

## 2013-06-28 ENCOUNTER — Ambulatory Visit (INDEPENDENT_AMBULATORY_CARE_PROVIDER_SITE_OTHER): Payer: Medicare Other | Admitting: Emergency Medicine

## 2013-06-28 VITALS — BP 148/78 | HR 93 | Temp 97.8°F | Resp 16 | Ht 61.75 in | Wt 168.6 lb

## 2013-06-28 DIAGNOSIS — E785 Hyperlipidemia, unspecified: Secondary | ICD-10-CM

## 2013-06-28 DIAGNOSIS — Z23 Encounter for immunization: Secondary | ICD-10-CM

## 2013-06-28 DIAGNOSIS — C069 Malignant neoplasm of mouth, unspecified: Secondary | ICD-10-CM

## 2013-06-28 LAB — POCT CBC
HCT, POC: 47 % (ref 37.7–47.9)
Hemoglobin: 15.1 g/dL (ref 12.2–16.2)
Lymph, poc: 2.8 (ref 0.6–3.4)
MCH, POC: 30 pg (ref 27–31.2)
MCHC: 32.1 g/dL (ref 31.8–35.4)
MCV: 93.3 fL (ref 80–97)
POC Granulocyte: 2.9 (ref 2–6.9)
POC LYMPH PERCENT: 44.4 %L (ref 10–50)
RDW, POC: 14.3 %
WBC: 6.2 10*3/uL (ref 4.6–10.2)

## 2013-06-28 LAB — LIPID PANEL
Cholesterol: 188 mg/dL (ref 0–200)
HDL: 44 mg/dL (ref >39)
LDL Cholesterol: 107 mg/dL — ABNORMAL HIGH (ref 0–99)
Total CHOL/HDL Ratio: 4.3 Ratio
Triglycerides: 185 mg/dL — ABNORMAL HIGH (ref <150)
VLDL: 37 mg/dL (ref 0–40)

## 2013-06-28 LAB — COMPREHENSIVE METABOLIC PANEL
Alkaline Phosphatase: 71 U/L (ref 39–117)
BUN: 16 mg/dL (ref 6–23)
CO2: 25 mEq/L (ref 19–32)
Creat: 0.64 mg/dL (ref 0.50–1.10)
Glucose, Bld: 98 mg/dL (ref 70–99)
Sodium: 138 mEq/L (ref 135–145)
Total Bilirubin: 0.4 mg/dL (ref 0.3–1.2)

## 2013-06-28 NOTE — Progress Notes (Signed)
  Subjective:    Patient ID: [name], female    DOB: [birth year], 72 y.o.   MRN: [id number]  HPI patient in followup hyperlipidemia. 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 She sees a Dr. Manson Passey who is an oral surgeon and had surgery by Dr. Annalee Genta. She does have an inflamed area on her bottom lip that she is concerned about his been present the last few weeks. She has not noticed any swelling in her neck or other problems    Review of Systems     Objective:   Physical Exam HEENT exam reveals dental implants the area where she had her mouth cancer removed is healing well there is a whitish area on the bottom lip with some surrounding erythema her neck is otherwise supple her chest is clear to auscultation and percussion  Results for orders placed in visit on 06/28/13  POCT CBC      Result Value Range   WBC 6.2  4.6 - 10.2 K/uL   Lymph, poc 2.8  0.6 - 3.4   POC LYMPH PERCENT 44.4  10 - 50 %L   MID (cbc) 0.5  0 - 0.9   POC MID % 8.5  0 - 12 %M   POC Granulocyte 2.9  2 - 6.9   Granulocyte percent 47.1  37 - 80 %G   RBC 5.04  4.04 - 5.48 M/uL   Hemoglobin 15.1  12.2 - 16.2 g/dL   HCT, POC 40.9  81.1 - 47.9 %   MCV 93.3  80 - 97 fL   MCH, POC 30.0  27 - 31.2 pg   MCHC 32.1  31.8 - 35.4 g/dL   RDW, POC 91.4     Platelet Count, POC 140 (*) 142 - 424 K/uL   MPV 9.3  0 - 99.8 fL        Assessment & Plan:  Routine labs were done today referral made to Dr. 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

## 2013-08-18 ENCOUNTER — Encounter: Payer: Self-pay | Admitting: Radiology

## 2013-08-24 ENCOUNTER — Telehealth: Payer: Self-pay

## 2013-08-24 NOTE — Telephone Encounter (Signed)
Patient has questions about when her next colonscopy should be, her insurance is saying it is time for her but the last time she saw one of our doctors they said it was not time please call her at [phone]

## 2013-08-25 ENCOUNTER — Encounter: Payer: Self-pay | Admitting: Radiology

## 2013-08-25 NOTE — Telephone Encounter (Signed)
She states [facility] has told her it was 2011. She is not due until 2016. Have abstracted.

## 2013-08-25 NOTE — Telephone Encounter (Signed)
Dr Cleta Alberts had me send letter, we had notice from insurance company colonoscopy due.

## 2013-09-25 ENCOUNTER — Ambulatory Visit (INDEPENDENT_AMBULATORY_CARE_PROVIDER_SITE_OTHER): Payer: Medicare Other | Admitting: Family Medicine

## 2013-09-25 VITALS — BP 122/66 | HR 106 | Temp 99.8°F | Resp 16 | Ht 62.0 in | Wt 128.0 lb

## 2013-09-25 DIAGNOSIS — J209 Acute bronchitis, unspecified: Secondary | ICD-10-CM

## 2013-09-25 MED ORDER — HYDROCODONE-HOMATROPINE 5-1.5 MG/5ML PO SYRP: 5.0000 mL | ORAL_SOLUTION | Freq: Four times a day (QID) | ORAL | Status: DC | PRN

## 2013-09-25 MED ORDER — AZITHROMYCIN 250 MG PO TABS: ORAL_TABLET | ORAL | Status: DC

## 2013-09-25 NOTE — Progress Notes (Signed)
This chart was scribed for Angela Sidle, MD by Arlan Organ, ED Scribe. This patient was seen in room Room 5 and the patient's care was started 10:07 AM.    WELLS GERDEMAN MRN: [id number], DOB: November 18, 1940, 72 y.o. Date of Encounter: 09/25/2013, 10:00 AM  Primary Physician: Lucilla Edin, MD  Chief Complaint:  Chief Complaint  Patient presents with  . Cough    x 3 days    HPI: 72 y.o. year old female presents with a 2 day history of gradual onset, unchanged, constant dry cough. Mild sinus pressure. She has tried OTC cold preps without success. No GI complaints. Appetite normal. She denies fever or chills. She denies any sick contacts, recent antibiotics, or recent travels. She denies leg trauma or sedentary periods. Denies h/o cancer. She denies any tobacco use. She denies a h/o asthma.  History reviewed. No pertinent past medical history.   Home Meds: Prior to Admission medications   Medication Sig Start Date End Date Taking? Authorizing Provider  calcium carbonate 200 MG capsule Take 600 mg by mouth 2 (two) times daily with a meal.   Yes Historical Provider, MD  fluticasone (FLONASE) 50 MCG/ACT nasal spray Place 2 sprays into the nose daily.   Yes Historical Provider, MD  Multiple Vitamins-Minerals (MULTIVITAMIN WITH MINERALS) tablet Take 1 tablet by mouth daily.   Yes Historical Provider, MD  oxybutynin (DITROPAN) 5 MG tablet Take 5 mg by mouth 3 (three) times daily.   Yes Historical Provider, MD  HYDROcodone-homatropine (HYCODAN) 5-1.5 MG/5ML syrup TAKE ONE TEASPOONFUL BY MOUTH EVERY 8 HOURS AS NEEDED FOR COUGH 12/31/11   Ryan M Dunn, PA-C  Mometasone Furo-Formoterol Fum 200-5 MCG/ACT AERO Inhale 2 puffs into the lungs 2 (two) times daily. 12/22/11   Angela Sidle, MD    Allergies: No Known Allergies  History   Social History  . Marital Status: Married    Spouse Name: N/A    Number of Children: N/A  . Years of Education: N/A   Occupational History  . Not on file.    Social History Main Topics  . Smoking status: Former Smoker    Quit date: 12/28/1986  . Smokeless tobacco: Not on file  . Alcohol Use: Not on file  . Drug Use: Not on file  . Sexual Activity: Not on file   Other Topics Concern  . Not on file   Social History Narrative  . No narrative on file     Review of Systems: Constitutional: negative for chills, fever, night sweats or weight changes Cardiovascular: negative for chest pain or palpitations Respiratory: negative for hemoptysis, wheezing, or shortness of breath Abdominal: negative for abdominal pain, nausea, vomiting or diarrhea Dermatological: negative for rash Neurologic: negative for headache   Physical Exam: Blood pressure 122/66, pulse 106, temperature 99.8 F (37.7 C), temperature source Oral, resp. rate 16, height 5\' 2"  (1.575 m), weight 128 lb (58.06 kg), SpO2 96.00%., Body mass index is 23.41 kg/(m^2). General: Well developed, well nourished, in no acute distress. Head: Normocephalic, atraumatic, eyes without discharge, sclera non-icteric, nares are congested. Bilateral auditory canals clear, TM's are without perforation, pearly grey with reflective cone of light bilaterally. No sinus TTP. Oral cavity moist, dentition normal. Posterior pharynx with post nasal drip and mild erythema. No peritonsillar abscess or tonsillar exudate. Neck: Supple. No thyromegaly. Full ROM. No lymphadenopathy. Lungs: Coarse breath sounds bilaterally without wheezes, rales, or rhonchi. Breathing is unlabored.  Heart: RRR with S1 S2. No murmurs, rubs, or gallops appreciated. Msk:  Strength and tone normal for age. Extremities: No clubbing or cyanosis. No edema. Neuro: Alert and oriented X 3. Moves all extremities spontaneously. CNII-XII grossly in tact. Psych:  Responds to questions appropriately with a normal affect.    ASSESSMENT AND PLAN:  72 y.o. year old female with bronchitis. Acute bronchitis - Plan: azithromycin (ZITHROMAX Z-PAK)  250 MG tablet, HYDROcodone-homatropine (HYCODAN) 5-1.5 MG/5ML syrup   - -Tylenol/Motrin prn -Rest/fluids -RTC precautions -RTC 3-5 days if no improvement  Signed, Angela Sidle, MD 09/25/2013 10:00 AM

## 2013-09-25 NOTE — Patient Instructions (Signed)

## 2014-02-25 ENCOUNTER — Encounter: Payer: Self-pay | Admitting: Podiatry

## 2014-02-25 ENCOUNTER — Ambulatory Visit (INDEPENDENT_AMBULATORY_CARE_PROVIDER_SITE_OTHER): Payer: Medicare HMO | Admitting: Podiatry

## 2014-02-25 VITALS — BP 121/75 | HR 81 | Resp 16

## 2014-02-25 DIAGNOSIS — L600 Ingrowing nail: Secondary | ICD-10-CM

## 2014-02-25 NOTE — Progress Notes (Signed)
Subjective:     Patient ID: [name], female   DOB: 11-Oct-1940, 73 y.o.   MRN: [id number]  HPI patient presents with a painful loose hallux nail right of approximate 8 month duration with some vague history of trauma   Review of Systems     Objective:   Physical Exam Neurovascular status intact with patient well oriented x3 and found to have a painful hallux nailbeds thickened and loose and gets sore as it   presses against the nailbed Assessment:     Damage hallux nail right with chronic discomfort associated with it    Plan:     Reviewed condition and recommended removal of the nail permanently. Patient wants this procedure and explained the procedure and risk and today infiltrated 60 mg Xylocaine Marcaine mixture and applied sterile prep to the nail. I then removed the nail plate exposed the matrix and applied chemical phenol 5 applications 30 seconds followed by alcohol lavaged and sterile dressing. Gave instructions on soaks and reappoint as needed

## 2014-02-25 NOTE — Progress Notes (Signed)
Right great toenail has a fungus , it gets sore some

## 2014-02-25 NOTE — Patient Instructions (Signed)

## 2014-03-08 ENCOUNTER — Ambulatory Visit: Payer: Medicare Other | Admitting: Podiatry

## 2014-03-29 ENCOUNTER — Ambulatory Visit (INDEPENDENT_AMBULATORY_CARE_PROVIDER_SITE_OTHER): Payer: Medicare HMO | Admitting: Podiatry

## 2014-03-29 VITALS — BP 128/72 | HR 82 | Resp 16

## 2014-03-29 DIAGNOSIS — L03039 Cellulitis of unspecified toe: Secondary | ICD-10-CM

## 2014-03-29 NOTE — Progress Notes (Signed)
Subjective:     Patient ID: [name], female   DOB: 1941-05-19, 73 y.o.   MRN: [id number]  HPI patient presents stating my right big toenail has some redness and I was concerned it was infected. It was fixed around one month ago   Review of Systems     Objective:   Physical Exam Neurovascular status is intact with no change in health history and noted to have a localized erythema edema in the proximal nail fold medial side with minimal discomfort when pressed. No proximal edema erythema or drainage is noted    Assessment:     Paronychia infection right hallux localized in nature    Plan:     Instructed on continuing soaks and allowing it to air dry a night with bandage usage during the day. If it should change at all we may need to consider antibiotics hold off at the current time

## 2014-06-09 ENCOUNTER — Other Ambulatory Visit: Payer: Self-pay

## 2014-06-09 DIAGNOSIS — Z1231 Encounter for screening mammogram for malignant neoplasm of breast: Secondary | ICD-10-CM

## 2014-06-14 ENCOUNTER — Other Ambulatory Visit: Payer: Self-pay | Admitting: Obstetrics and Gynecology

## 2014-06-14 DIAGNOSIS — Z780 Asymptomatic menopausal state: Secondary | ICD-10-CM

## 2014-07-06 ENCOUNTER — Ambulatory Visit
Admission: RE | Admit: 2014-07-06 | Discharge: 2014-07-06 | Disposition: A | Discharge status: Home / Self Care | Payer: Medicare HMO | Source: Ambulatory Visit | Attending: Obstetrics and Gynecology | Admitting: Obstetrics and Gynecology

## 2014-07-06 ENCOUNTER — Ambulatory Visit
Admission: RE | Admit: 2014-07-06 | Discharge: 2014-07-06 | Disposition: A | Discharge status: Home / Self Care | Payer: Medicare HMO | Source: Ambulatory Visit

## 2014-07-06 DIAGNOSIS — Z1231 Encounter for screening mammogram for malignant neoplasm of breast: Secondary | ICD-10-CM

## 2014-07-06 DIAGNOSIS — Z780 Asymptomatic menopausal state: Secondary | ICD-10-CM

## 2014-07-13 ENCOUNTER — Ambulatory Visit (INDEPENDENT_AMBULATORY_CARE_PROVIDER_SITE_OTHER): Payer: Medicare HMO | Admitting: Emergency Medicine

## 2014-07-13 VITALS — BP 126/78 | HR 98 | Temp 98.2°F | Resp 17 | Ht 62.0 in | Wt 168.0 lb

## 2014-07-13 DIAGNOSIS — E785 Hyperlipidemia, unspecified: Secondary | ICD-10-CM

## 2014-07-13 DIAGNOSIS — Z23 Encounter for immunization: Secondary | ICD-10-CM

## 2014-07-13 LAB — COMPREHENSIVE METABOLIC PANEL
ALK PHOS: 68 U/L (ref 39–117)
ALT: 18 U/L (ref 0–35)
AST: 17 U/L (ref 0–37)
Albumin: 4 g/dL (ref 3.5–5.2)
BILIRUBIN TOTAL: 0.5 mg/dL (ref 0.2–1.2)
BUN: 17 mg/dL (ref 6–23)
CO2: 25 mEq/L (ref 19–32)
Calcium: 9.3 mg/dL (ref 8.4–10.5)
Chloride: 105 mEq/L (ref 96–112)
Creat: 0.63 mg/dL (ref 0.50–1.10)
Glucose, Bld: 106 mg/dL — ABNORMAL HIGH (ref 70–99)
Potassium: 4.2 mEq/L (ref 3.5–5.3)
Sodium: 140 mEq/L (ref 135–145)
TOTAL PROTEIN: 6.8 g/dL (ref 6.0–8.3)

## 2014-07-13 LAB — POCT CBC
Granulocyte percent: 61.4 %G (ref 37–80)
HCT, POC: 47.3 % (ref 37.7–47.9)
HEMOGLOBIN: 15.3 g/dL (ref 12.2–16.2)
LYMPH, POC: 3 (ref 0.6–3.4)
MCH, POC: 29.5 pg (ref 27–31.2)
MCHC: 32.3 g/dL (ref 31.8–35.4)
MCV: 91.2 fL (ref 80–97)
MID (cbc): 0.4 (ref 0–0.9)
MPV: 7.4 fL (ref 0–99.8)
POC Granulocyte: 5.4 (ref 2–6.9)
POC LYMPH %: 33.7 % (ref 10–50)
POC MID %: 4.9 %M (ref 0–12)
Platelet Count, POC: 188 10*3/uL (ref 142–424)
RBC: 5.18 M/uL (ref 4.04–5.48)
RDW, POC: 14.6 %
WBC: 8.8 10*3/uL (ref 4.6–10.2)

## 2014-07-13 LAB — LIPID PANEL
Cholesterol: 189 mg/dL (ref 0–200)
HDL: 61 mg/dL (ref >39)
LDL Cholesterol: 104 mg/dL — ABNORMAL HIGH (ref 0–99)
TRIGLYCERIDES: 122 mg/dL (ref <150)
Total CHOL/HDL Ratio: 3.1 Ratio
VLDL: 24 mg/dL (ref 0–40)

## 2014-07-13 NOTE — Progress Notes (Signed)
Subjective:    Patient ID: [name], female    DOB: [birth year], 73 y.o.   MRN: [id number] This chart was scribed for Arlyss Queen, MD by Marti Sleigh, Medical Scribe. This patient was seen in Room 11 and the patient's care was started at 8:46 AM.  HPI HPI Comments: [name] is a 73 y.o. female who presents to [facility] needing annual blood work and immunizations. Pt states she wants a pneumonia shot, a flu shot and lab work. Pt states she is experiencing back problems, but she is treated at [facility] by Dr. Ernestina Patches and is satisfied with her care. Pt denies CP, SOB, digestive issues or any other health problems. Pt states that she has a past hx of mouth cancer, and sees Dr. Wilburn Cornelia every 6 months for a checkup. Pt states she has her bi-annual check up in two weeks.    Review of Systems  Constitutional: Negative for appetite change and fatigue.  HENT: Negative for congestion, ear discharge and sinus pressure.   Eyes: Negative for discharge.  Respiratory: Negative for cough.   Cardiovascular: Negative for chest pain.  Gastrointestinal: Negative for abdominal pain and diarrhea.  Genitourinary: Negative for frequency and hematuria.  Musculoskeletal: Positive for back pain.  Skin: Negative for rash.  Neurological: Negative for seizures and headaches.  Psychiatric/Behavioral: Negative for hallucinations.       Objective:   Physical Exam  Constitutional: She is oriented to person, place, and time. She appears well-developed and well-nourished.  HENT:  Head: Normocephalic and atraumatic.  Eyes: Conjunctivae and EOM are normal. No scleral icterus.  Neck: Neck supple. No thyromegaly present.  Cardiovascular: Normal rate and regular rhythm.  Exam reveals no gallop and no friction rub.   No murmur heard. Pulmonary/Chest: No stridor. She has no wheezes. She has no rales. She exhibits no tenderness.  Abdominal: She exhibits no distension. There is no tenderness. There is no  rebound.  Musculoskeletal: Normal range of motion. She exhibits no edema.  Lymphadenopathy:    She has no cervical adenopathy.  Neurological: She is alert and oriented to person, place, and time. She exhibits normal muscle tone. Coordination normal.  Skin: No rash noted. No erythema.  Psychiatric: She has a normal mood and affect. Her behavior is normal.    Results for orders placed in visit on 07/13/14  POCT CBC      Result Value Ref Range   WBC 8.8  4.6 - 10.2 K/uL   Lymph, poc 3.0  0.6 - 3.4   POC LYMPH PERCENT 33.7  10 - 50 %L   MID (cbc) 0.4  0 - 0.9   POC MID % 4.9  0 - 12 %M   POC Granulocyte 5.4  2 - 6.9   Granulocyte percent 61.4  37 - 80 %G   RBC 5.18  4.04 - 5.48 M/uL   Hemoglobin 15.3  12.2 - 16.2 g/dL   HCT, POC 47.3  37.7 - 47.9 %   MCV 91.2  80 - 97 fL   MCH, POC 29.5  27 - 31.2 pg   MCHC 32.3  31.8 - 35.4 g/dL   RDW, POC 14.6     Platelet Count, POC 188  142 - 424 K/uL   MPV 7.4  0 - 99.8 fL        Assessment & Plan:  Routine labs were done. She will continue her close followup with Dr. Wilburn Cornelia. No change in medications. She was given a flu shot  and Prevnar vaccine. I personally performed the services described in this documentation, which was scribed in my presence. The recorded information has been reviewed and is accurate.

## 2014-07-27 ENCOUNTER — Telehealth: Payer: Self-pay

## 2014-07-27 NOTE — Telephone Encounter (Signed)
Pt called in and states her PNA vaccine is not showing up on my chart. Pt also wants [place] to contact Dr Ronita Hipps [facility] and have them fax over her tetnus result so we can enter it into her my chart also  . Thank you

## 2014-08-23 ENCOUNTER — Telehealth: Payer: Self-pay

## 2014-08-23 NOTE — Telephone Encounter (Signed)
Spoke to pt. She did not understand a couple of issues on her problem list. I tried my best to clarify why those issues were on her problem list. She seemed to understand.

## 2014-08-23 NOTE — Telephone Encounter (Signed)
Contacted IT and they stated that prevnar 13 is not listed yet and is unable to note that in chart.  They are working on this issue.  Called Dr. Ellwood Sayers left message for them to send over tetanus

## 2014-08-23 NOTE — Telephone Encounter (Signed)
Patient's husband called. States he and his wife were looking on mychart and they want to know where some of the information came from. Please return call and advise. CB # [phone]

## 2014-11-09 ENCOUNTER — Other Ambulatory Visit: Payer: Self-pay | Admitting: Orthopaedic Surgery

## 2014-11-09 DIAGNOSIS — M25562 Pain in left knee: Secondary | ICD-10-CM

## 2014-11-19 ENCOUNTER — Ambulatory Visit
Admission: RE | Admit: 2014-11-19 | Discharge: 2014-11-19 | Disposition: A | Discharge status: Home / Self Care | Payer: Medicare HMO | Source: Ambulatory Visit | Attending: Orthopaedic Surgery | Admitting: Orthopaedic Surgery

## 2014-11-19 DIAGNOSIS — M25562 Pain in left knee: Secondary | ICD-10-CM

## 2015-02-10 ENCOUNTER — Ambulatory Visit (INDEPENDENT_AMBULATORY_CARE_PROVIDER_SITE_OTHER): Payer: PPO | Admitting: Family Medicine

## 2015-02-10 VITALS — BP 126/74 | HR 81 | Temp 98.1°F | Resp 18 | Ht 62.0 in | Wt 174.4 lb

## 2015-02-10 DIAGNOSIS — R238 Other skin changes: Secondary | ICD-10-CM

## 2015-02-10 DIAGNOSIS — R059 Cough, unspecified: Secondary | ICD-10-CM

## 2015-02-10 DIAGNOSIS — R05 Cough: Secondary | ICD-10-CM | POA: Diagnosis not present

## 2015-02-10 DIAGNOSIS — R233 Spontaneous ecchymoses: Secondary | ICD-10-CM

## 2015-02-10 LAB — POCT CBC
Granulocyte percent: 55.5 %G (ref 37–80)
HCT, POC: 45.4 % (ref 37.7–47.9)
Hemoglobin: 14.7 g/dL (ref 12.2–16.2)
LYMPH, POC: 2.5 (ref 0.6–3.4)
MCH: 28.9 pg (ref 27–31.2)
MCHC: 32.4 g/dL (ref 31.8–35.4)
MCV: 89.3 fL (ref 80–97)
MID (CBC): 0.4 (ref 0–0.9)
MPV: 7.3 fL (ref 0–99.8)
PLATELET COUNT, POC: 220 10*3/uL (ref 142–424)
POC Granulocyte: 3.6 (ref 2–6.9)
POC LYMPH PERCENT: 38.5 %L (ref 10–50)
POC MID %: 6 % (ref 0–12)
RBC: 5.08 M/uL (ref 4.04–5.48)
RDW, POC: 14.6 %
WBC: 6.4 10*3/uL (ref 4.6–10.2)

## 2015-02-10 MED ORDER — BENZONATATE 100 MG PO CAPS: 100.0000 mg | ORAL_CAPSULE | Freq: Three times a day (TID) | ORAL | Status: DC | PRN

## 2015-02-10 MED ORDER — MONTELUKAST SODIUM 10 MG PO TABS: 10.0000 mg | ORAL_TABLET | Freq: Every day | ORAL | Status: DC

## 2015-02-10 NOTE — Patient Instructions (Signed)
Your blood counts are normal- I think that the aspirin is probably causing your bruising but it is not anything to worry about unless it gets worse For your cough, use the tessalon perles as needed and the singulair once a day.   Let me know if you are not improving over the next week or so- Sooner if worse.    Stop using the singulair in about one month and see if your cough returns.

## 2015-02-10 NOTE — Progress Notes (Signed)
[facility] [address], [place] [address] [phone]  Date:  02/10/2015   Name:  [name]   DOB:  18-Dec-1940   MRN:  [id number]  PCP:  Jenny Reichmann, MD    Chief Complaint: Cough   History of Present Illness:  [name] is a 74 y.o. very pleasant female patient who presents with the following:  Here today with a cough "off an on for 3-4 weeks but worse the last few days."  The cough is non- productive.   No fever, chills or aches.  OW she feels pretty well No ST, she has a mild runny nose.  No GI symptoms The cough can be worse when she is exposed to pollen.   She does use flonase spray She has noted easier bruising mostly on her arms for the last year or so.  She takes a baby asa daily but no other blood thinner medications.  She has not noted any other bleeding   Patient Active Problem List   Diagnosis Date Noted  . Carcinoma of nasal cavity 09/22/2012  . Hyperlipidemia 09/19/2012  . 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 Chronic nasal congestion 12/22/2011    History reviewed. No pertinent past medical history.  Past Surgical History  Procedure Laterality Date  . Appendectomy    . Knee arthroscopy w/ meniscal repair Left     History  Substance Use Topics  . Smoking status: Former Smoker    Quit date: 12/28/1986  . Smokeless tobacco: Not on file  . Alcohol Use: Not on file    History reviewed. No pertinent family history.  No Known Allergies  Medication list has been reviewed and updated.  Current Outpatient Prescriptions on File Prior to Visit  Medication Sig Dispense Refill  . aspirin 81 MG tablet Take 81 mg by mouth daily.    . calcium carbonate 200 MG capsule Take 600 mg by mouth 2 (two) times daily with a meal.    . chlorhexidine (PERIDEX) 0.12 % solution     . fluticasone (FLONASE) 50 MCG/ACT nasal spray Place 2 sprays into the nose daily.    . Multiple Vitamins-Minerals (MULTIVITAMIN WITH  MINERALS) tablet Take 1 tablet by mouth daily.    Marland Kitchen oxybutynin (DITROPAN) 5 MG tablet Take 5 mg by mouth 2 (two) times daily.     . traMADol (ULTRAM) 50 MG tablet Take by mouth every 6 (six) hours as needed.    . Mometasone Furo-Formoterol Fum 200-5 MCG/ACT AERO Inhale 2 puffs into the lungs 2 (two) times daily. (Patient not taking: Reported on 02/10/2015) 1 Inhaler 3   No current facility-administered medications on file prior to visit.    Review of Systems:  As per HPI- otherwise negative.   Physical Examination: Filed Vitals:   02/10/15 1305  BP: 126/74  Pulse: 81  Temp: 98.1 F (36.7 C)  Resp: 18   Filed Vitals:   02/10/15 1305  Height: 5\' 2"  (1.575 m)  Weight: 174 lb 5.8 oz (79.089 kg)   Body mass index is 31.88 kg/(m^2). Ideal Body Weight: Weight in (lb) to have BMI = 25: 136.4  GEN: WDWN, NAD, Non-toxic, A & O x 3, oberweight, looks well HEENT: Atraumatic, Normocephalic. Neck supple. No masses, No LAD.  Bilateral TM wnl, oropharynx normal.  PEERL,EOMI.   Ears and Nose: No external deformity. CV: RRR, No M/G/R. No JVD. No thrill. No extra heart sounds. PULM: CTA B, no wheezes, crackles, rhonchi.  No retractions. No resp. distress. No accessory muscle use. EXTR: No c/c/e NEURO Normal gait.  PSYCH: Normally interactive. Conversant. Not depressed or anxious appearing.  Calm demeanor.  Small bruise on her right arm  Results for orders placed or performed in visit on 02/10/15  POCT CBC  Result Value Ref Range   WBC 6.4 4.6 - 10.2 K/uL   Lymph, poc 2.5 0.6 - 3.4   POC LYMPH PERCENT 38.5 10 - 50 %L   MID (cbc) 0.4 0 - 0.9   POC MID % 6.0 0 - 12 %M   POC Granulocyte 3.6 2 - 6.9   Granulocyte percent 55.5 37 - 80 %G   RBC 5.08 4.04 - 5.48 M/uL   Hemoglobin 14.7 12.2 - 16.2 g/dL   HCT, POC 45.4 37.7 - 47.9 %   MCV 89.3 80 - 97 fL   MCH, POC 28.9 27 - 31.2 pg   MCHC 32.4 31.8 - 35.4 g/dL   RDW, POC 14.6 %   Platelet Count, POC 220 142 - 424 K/uL   MPV 7.3 0 - 99.8 fL     Assessment and Plan: Cough - Plan: benzonatate (TESSALON) 100 MG capsule, montelukast (SINGULAIR) 10 MG tablet  Easy bruising - Plan: POCT CBC  Suspect her cough is due to allergies or another benign process, not infection.   Tessalon perles prn, singulair as needed reasured that aspirin therapy can trigger bruising but OW no sign of concern  Signed Lamar Blinks, MD

## 2015-02-13 ENCOUNTER — Telehealth: Payer: Self-pay

## 2015-02-13 DIAGNOSIS — R053 Chronic cough: Secondary | ICD-10-CM

## 2015-02-13 DIAGNOSIS — R05 Cough: Secondary | ICD-10-CM

## 2015-02-13 NOTE — Telephone Encounter (Signed)
Pt seen Friday and given singulair which she takes at bedtime. She sleeps pretty good, after getting past a coughing spell when she first lays down, but cough during the day is still bad. States tessalon perles do not help cough.

## 2015-02-13 NOTE — Telephone Encounter (Signed)
Pt states she is worse on the new medications she was prescribed,please advise   Best phone for pt i [place])

## 2015-02-14 MED ORDER — DOXYCYCLINE HYCLATE 100 MG PO CAPS: 100.0000 mg | ORAL_CAPSULE | Freq: Two times a day (BID) | ORAL | Status: DC

## 2015-02-14 MED ORDER — ACETAMINOPHEN-CODEINE #3 300-30 MG PO TABS: 1.0000 | ORAL_TABLET | Freq: Four times a day (QID) | ORAL | Status: DC | PRN

## 2015-02-14 NOTE — Telephone Encounter (Signed)
Pt calling again very upset that we haven't responded to her message from yesterday. Can we do something to help her?

## 2015-02-14 NOTE — Telephone Encounter (Signed)
Todd forwarded message to Dr Lorelei Pont. Per Sherren Mocha, call pt in the mean time and make sure she is taking flonase, tessalon perles bid and an singulair. I spoke with pt-says she is taking all of this. Will wait on Dr Copland's response.

## 2015-02-14 NOTE — Telephone Encounter (Signed)
Called and spoke with pt.  Except for the cough she really feels ok.  She has been coughing now for about a month Will rx doxycycline.  For her cough will use a tylenol #3 as needed- she will not combine this with the tramadol that she uses on occasion.  Apologized for not responding to her sooner- I just received her message today

## 2015-02-25 ENCOUNTER — Ambulatory Visit (INDEPENDENT_AMBULATORY_CARE_PROVIDER_SITE_OTHER): Payer: PPO | Admitting: Family Medicine

## 2015-02-25 VITALS — BP 120/80 | HR 74 | Temp 98.4°F | Ht 62.5 in | Wt 174.0 lb

## 2015-02-25 DIAGNOSIS — J209 Acute bronchitis, unspecified: Secondary | ICD-10-CM | POA: Diagnosis not present

## 2015-02-25 MED ORDER — HYDROCODONE-HOMATROPINE 5-1.5 MG/5ML PO SYRP: 5.0000 mL | ORAL_SOLUTION | Freq: Three times a day (TID) | ORAL | Status: DC | PRN

## 2015-02-25 MED ORDER — PREDNISONE 20 MG PO TABS: ORAL_TABLET | ORAL | Status: DC

## 2015-02-25 NOTE — Patient Instructions (Signed)

## 2015-02-25 NOTE — Progress Notes (Signed)
° °  Subjective:  This chart was scribed for Robyn Haber, MD by Leandra Kern, Medical Scribe. This patient was seen in Room 14 and the patient's care was started at 11:18 AM.   Patient ID: [name], female    DOB: June 04, 1941, 74 y.o.   MRN: [id number]  HPI HPI Comments: [name] is a 74 y.o. female who [facility] complaining of intermittent cough, gradual onset few weeks ago.  Patient reports being on a previous medication that was not effective for her. Therefore she came back to [facility] about two weeks ago, and she was prescribed Tessalon and Singular, she had mild relief from that. However, she still suffers symptoms of that. She denies vomiting. No PMHx of Asthma.  She indicates still doing physical exercise such as work around the house and the yard.  Patient has a previous occupation of being a Regulatory affairs officer.    Review of Systems  Respiratory: Positive for cough.   Gastrointestinal: Negative for vomiting.      Objective:   Physical Exam  Constitutional: She is oriented to person, place, and time. She appears well-developed and well-nourished. No distress.  HENT:  Head: Normocephalic and atraumatic.  Mouth/Throat: Oropharynx is clear and moist. No oropharyngeal exudate.  Eyes: Pupils are equal, round, and reactive to light.  Neck: Neck supple.  Cardiovascular: Normal rate.   Pulmonary/Chest: Effort normal.  Musculoskeletal: She exhibits no edema.  Neurological: She is alert and oriented to person, place, and time. No cranial nerve deficit.  Skin: Skin is warm and dry. No rash noted.  Psychiatric: She has a normal mood and affect. Her behavior is normal.  Nursing note and vitals reviewed.     Assessment & Plan:   This chart was scribed in my presence and reviewed by me personally.    ICD-9-CM ICD-10-CM   1. Acute bronchitis, unspecified organism 466.0 J20.9 predniSONE (DELTASONE) 20 MG tablet     HYDROcodone-homatropine (HYCODAN)  5-1.5 MG/5ML syrup     Signed, Robyn Haber, MD

## 2015-02-28 ENCOUNTER — Encounter: Payer: Self-pay | Admitting: Family Medicine

## 2015-06-16 ENCOUNTER — Other Ambulatory Visit: Payer: Self-pay

## 2015-06-16 DIAGNOSIS — Z1231 Encounter for screening mammogram for malignant neoplasm of breast: Secondary | ICD-10-CM

## 2015-07-03 ENCOUNTER — Ambulatory Visit (INDEPENDENT_AMBULATORY_CARE_PROVIDER_SITE_OTHER): Payer: PPO | Admitting: Emergency Medicine

## 2015-07-03 ENCOUNTER — Ambulatory Visit (INDEPENDENT_AMBULATORY_CARE_PROVIDER_SITE_OTHER): Payer: PPO

## 2015-07-03 VITALS — BP 138/68 | HR 87 | Temp 97.5°F | Resp 16 | Ht 62.0 in | Wt 146.0 lb

## 2015-07-03 DIAGNOSIS — M545 Low back pain, unspecified: Secondary | ICD-10-CM

## 2015-07-03 DIAGNOSIS — Z23 Encounter for immunization: Secondary | ICD-10-CM

## 2015-07-03 DIAGNOSIS — E785 Hyperlipidemia, unspecified: Secondary | ICD-10-CM

## 2015-07-03 LAB — LIPID PANEL
CHOL/HDL RATIO: 3.2 ratio (ref <=5.0)
Cholesterol: 161 mg/dL (ref 125–200)
HDL: 51 mg/dL (ref >=46)
LDL CALC: 90 mg/dL (ref <130)
Triglycerides: 101 mg/dL (ref <150)
VLDL: 20 mg/dL (ref <30)

## 2015-07-03 LAB — COMPLETE METABOLIC PANEL WITH GFR
ALT: 13 U/L (ref 6–29)
AST: 15 U/L (ref 10–35)
Albumin: 4.1 g/dL (ref 3.6–5.1)
Alkaline Phosphatase: 65 U/L (ref 33–130)
BUN: 18 mg/dL (ref 7–25)
CHLORIDE: 103 mmol/L (ref 98–110)
CO2: 29 mmol/L (ref 20–31)
CREATININE: 0.72 mg/dL (ref 0.60–0.93)
Calcium: 9.2 mg/dL (ref 8.6–10.4)
GFR, Est African American: >89 mL/min (ref >=60)
GFR, Est Non African American: 83 mL/min (ref >=60)
Glucose, Bld: 112 mg/dL — ABNORMAL HIGH (ref 65–99)
Potassium: 4.4 mmol/L (ref 3.5–5.3)
Sodium: 141 mmol/L (ref 135–146)
Total Bilirubin: 0.6 mg/dL (ref 0.2–1.2)
Total Protein: 6.6 g/dL (ref 6.1–8.1)

## 2015-07-03 LAB — POCT CBC
Granulocyte percent: 54.4 %G (ref 37–80)
HCT, POC: 46.8 % (ref 37.7–47.9)
Hemoglobin: 14.9 g/dL (ref 12.2–16.2)
Lymph, poc: 2.3 (ref 0.6–3.4)
MCH: 27.7 pg (ref 27–31.2)
MCHC: 31.8 g/dL (ref 31.8–35.4)
MCV: 87.3 fL (ref 80–97)
MID (cbc): 0.4 (ref 0–0.9)
MPV: 7.3 fL (ref 0–99.8)
PLATELET COUNT, POC: 208 10*3/uL (ref 142–424)
POC Granulocyte: 3.3 (ref 2–6.9)
POC LYMPH PERCENT: 39.1 %L (ref 10–50)
POC MID %: 6.5 % (ref 0–12)
RBC: 5.36 M/uL (ref 4.04–5.48)
RDW, POC: 14 %
WBC: 6 10*3/uL (ref 4.6–10.2)

## 2015-07-03 LAB — POCT URINALYSIS DIP (MANUAL ENTRY)
BILIRUBIN UA: NEGATIVE
BILIRUBIN UA: NEGATIVE
Glucose, UA: NEGATIVE
NITRITE UA: NEGATIVE
PH UA: 5.5
Protein Ur, POC: NEGATIVE
SPEC GRAV UA: 1.025
Urobilinogen, UA: 0.2

## 2015-07-03 LAB — POC MICROSCOPIC URINALYSIS (UMFC)

## 2015-07-03 MED ORDER — TRAMADOL HCL 50 MG PO TABS: 50.0000 mg | ORAL_TABLET | Freq: Four times a day (QID) | ORAL | Status: DC | PRN

## 2015-07-03 NOTE — Progress Notes (Signed)
This chart was scribed for Nena Jordan, MD by [facility], medical scribe at [facility].The patient was seen in exam room 10 and the patient's care was started at 8:32 AM.  Chief Complaint:  Chief Complaint  Patient presents with  . Back Pain    LBP  . Flu Vaccine  . blood work   HPI: [name] is a 74 y.o. female who reports to [facility] today complaining of back pain. No known injury. Also wants yearly blood work and her flu shot today. Hx of mouth cancer, diagnosed 4-5 years ago. Followed by Dr. Wilburn Cornelia. Seen every 6 months for this.  History reviewed. No pertinent past medical history. Past Surgical History  Procedure Laterality Date  . Appendectomy    . Knee arthroscopy w/ meniscal repair Left    Social History   Social History  . Marital Status: Married    Spouse Name: N/A  . Number of Children: N/A  . Years of Education: N/A   Social History Main Topics  . Smoking status: Former Smoker    Quit date: 12/28/1986  . Smokeless tobacco: None  . Alcohol Use: None  . Drug Use: None  . Sexual Activity: Not Asked   Other Topics Concern  . None   Social History Narrative   History reviewed. No pertinent family history. No Known Allergies Prior to Admission medications   Medication Sig Start Date End Date Taking? Authorizing Provider  aspirin 81 MG tablet Take 81 mg by mouth daily.   Yes Historical Provider, MD  calcium carbonate 200 MG capsule Take 600 mg by mouth 2 (two) times daily with a meal.   Yes Historical Provider, MD  chlorhexidine (PERIDEX) 0.12 % solution  01/24/14  Yes Historical Provider, MD  fluticasone (FLONASE) 50 MCG/ACT nasal spray Place 2 sprays into the nose daily.   Yes Historical Provider, MD  Multiple Vitamins-Minerals (MULTIVITAMIN WITH MINERALS) tablet Take 1 tablet by mouth daily.   Yes Historical Provider, MD  oxybutynin (DITROPAN) 5 MG tablet Take 5 mg by mouth 2 (two) times daily.    Yes Historical Provider, MD    traMADol (ULTRAM) 50 MG tablet Take by mouth every 6 (six) hours as needed.   Yes Historical Provider, MD  acetaminophen-codeine (TYLENOL #3) 300-30 MG per tablet Take 1 tablet by mouth every 6 (six) hours as needed for moderate pain. Or cough Patient not taking: Reported on 07/03/2015 02/14/15   Darreld Mclean, MD  benzonatate (TESSALON) 100 MG capsule Take 1 capsule (100 mg total) by mouth 3 (three) times daily as needed for cough. Patient not taking: Reported on 02/25/2015 02/10/15   Darreld Mclean, MD  HYDROcodone-homatropine (HYCODAN) 5-1.5 MG/5ML syrup Take 5 mLs by mouth every 8 (eight) hours as needed for cough. Patient not taking: Reported on 07/03/2015 02/25/15   Robyn Haber, MD  Mometasone Furo-Formoterol Fum 200-5 MCG/ACT AERO Inhale 2 puffs into the lungs 2 (two) times daily. Patient not taking: Reported on 07/03/2015 12/22/11   Robyn Haber, MD  montelukast (SINGULAIR) 10 MG tablet Take 1 tablet (10 mg total) by mouth at bedtime. Patient not taking: Reported on 07/03/2015 02/10/15   Darreld Mclean, MD  predniSONE (DELTASONE) 20 MG tablet Two daily with food Patient not taking: Reported on 07/03/2015 02/25/15   Robyn Haber, MD    ROS: The patient denies fevers, chills, night sweats, unintentional weight loss, chest pain, palpitations, wheezing, dyspnea on exertion, nausea, vomiting, abdominal pain, dysuria,  hematuria, melena, numbness, weakness, or tingling.  All other systems have been reviewed and were otherwise negative with the exception of those mentioned in the HPI and as above.    PHYSICAL EXAM: Filed Vitals:   07/03/15 0821  BP: 138/68  Pulse: 87  Temp: 97.5 F (36.4 C)  Resp: 16   Body mass index is 26.7 kg/(m^2).  General: Alert, no acute distress HEENT:  Normocephalic, atraumatic, oropharynx patent. Upper and lower dentures  Eye: EOMI, [facility] Cardiovascular:  Regular rate and rhythm, no rubs murmurs or gallops.  No Carotid bruits, radial pulse intact.  No pedal edema.  Respiratory: Clear to auscultation bilaterally.  No wheezes, rales, or rhonchi.  No cyanosis, no use of accessory musculature Abdominal: No organomegaly, abdomen is soft and non-tender, positive bowel sounds.  No masses. Musculoskeletal: Gait intact. No edema, tenderness Skin: No rashes. Neurologic: Facial musculature symmetric. Psychiatric: Patient acts appropriately throughout our interaction. Lymphatic: No cervical or submandibular lymphadenopathy  LABS: Results for orders placed or performed in visit on 02/10/15  POCT CBC  Result Value Ref Range   WBC 6.4 4.6 - 10.2 K/uL   Lymph, poc 2.5 0.6 - 3.4   POC LYMPH PERCENT 38.5 10 - 50 %L   MID (cbc) 0.4 0 - 0.9   POC MID % 6.0 0 - 12 %M   POC Granulocyte 3.6 2 - 6.9   Granulocyte percent 55.5 37 - 80 %G   RBC 5.08 4.04 - 5.48 M/uL   Hemoglobin 14.7 12.2 - 16.2 g/dL   HCT, POC 45.4 37.7 - 47.9 %   MCV 89.3 80 - 97 fL   MCH, POC 28.9 27 - 31.2 pg   MCHC 32.4 31.8 - 35.4 g/dL   RDW, POC 14.6 %   Platelet Count, POC 220 142 - 424 K/uL   MPV 7.3 0 - 99.8 fL   EKG/XRAY:   Primary read interpreted by Dr. [facility]lene Farrier at [facility]. There is vascular calcification. There is osteoporosis noted. There are no bony lesions identified.  ASSESSMENT/PLAN: I did give her some Ultram. She has taken it before for knee problems. Routine labs were done. She had a bone density study done a year ago which was normal. Gross sideeffects, risk and benefits, and alternatives of medications d/w patient. Patient is aware that all medications have potential sideeffects and we are unable to predict every sideeffect or drug-drug interaction that may occur.  By signing my name below, I, Nadim Abuhashem, attest that this documentation has been prepared under the direction and in the presence of Nena Jordan, MD.  Electronically Signed: Lora Havens, medical scribe. 07/03/2015, 8:29 AM.  Arlyss Queen MD 07/03/2015 8:29 AM

## 2015-07-03 NOTE — Patient Instructions (Signed)
Back Pain, Adult Low back pain is very common. About 1 in 5 people have back pain.The cause of low back pain is rarely dangerous. The pain often gets better over time.About half of people with a sudden onset of back pain feel better in just 2 weeks. About 8 in 10 people feel better by 6 weeks.  CAUSES Some common causes of back pain include:  Strain of the muscles or ligaments supporting the spine.  Wear and tear (degeneration) of the spinal discs.  Arthritis.  Direct injury to the back. DIAGNOSIS Most of the time, the direct cause of low back pain is not known.However, back pain can be treated effectively even when the exact cause of the pain is unknown.Answering your caregiver's questions about your overall health and symptoms is one of the most accurate ways to make sure the cause of your pain is not dangerous. If your caregiver needs more information, he or she may order lab work or imaging tests (X-rays or MRIs).However, even if imaging tests show changes in your back, this usually does not require surgery. HOME CARE INSTRUCTIONS For many people, back pain returns.Since low back pain is rarely dangerous, it is often a condition that people can learn to manageon their own.   Remain active. It is stressful on the back to sit or stand in one place. Do not sit, drive, or stand in one place for more than 30 minutes at a time. Take short walks on level surfaces as soon as pain allows.Try to increase the length of time you walk each day.  Do not stay in bed.Resting more than 1 or 2 days can delay your recovery.  Do not avoid exercise or work.Your body is made to move.It is not dangerous to be active, even though your back may hurt.Your back will likely heal faster if you return to being active before your pain is gone.  Pay attention to your body when you bend and lift. Many people have less discomfortwhen lifting if they bend their knees, keep the load close to their bodies,and  avoid twisting. Often, the most comfortable positions are those that put less stress on your recovering back.  Find a comfortable position to sleep. Use a firm mattress and lie on your side with your knees slightly bent. If you lie on your back, put a pillow under your knees.  Only take over-the-counter or prescription medicines as directed by your caregiver. Over-the-counter medicines to reduce pain and inflammation are often the most helpful.Your caregiver may prescribe muscle relaxant drugs.These medicines help dull your pain so you can more quickly return to your normal activities and healthy exercise.  Put ice on the injured area.  Put ice in a plastic bag.  Place a towel between your skin and the bag.  Leave the ice on for 15-20 minutes, 03-04 times a day for the first 2 to 3 days. After that, ice and heat may be alternated to reduce pain and spasms.  Ask your caregiver about trying back exercises and gentle massage. This may be of some benefit.  Avoid feeling anxious or stressed.Stress increases muscle tension and can worsen back pain.It is important to recognize when you are anxious or stressed and learn ways to manage it.Exercise is a great option. SEEK MEDICAL CARE IF:  You have pain that is not relieved with rest or medicine.  You have pain that does not improve in 1 week.  You have new symptoms.  You are generally not feeling well. SEEK   IMMEDIATE MEDICAL CARE IF:   You have pain that radiates from your back into your legs.  You develop new bowel or bladder control problems.  You have unusual weakness or numbness in your arms or legs.  You develop nausea or vomiting.  You develop abdominal pain.  You feel faint. Document Released: 09/23/2005 Document Revised: 03/24/2012 Document Reviewed: 01/25/2014 ExitCare Patient Information 2015 [employer]. This information is not intended to replace advice given to you by your health care provider. Make sure you  discuss any questions you have with your health care provider.  

## 2015-07-05 ENCOUNTER — Ambulatory Visit (INDEPENDENT_AMBULATORY_CARE_PROVIDER_SITE_OTHER): Payer: PPO | Admitting: Emergency Medicine

## 2015-07-05 VITALS — BP 122/72 | HR 85 | Temp 98.3°F | Resp 17 | Ht 61.5 in | Wt 145.0 lb

## 2015-07-05 DIAGNOSIS — R002 Palpitations: Secondary | ICD-10-CM

## 2015-07-05 LAB — POCT CBC
Granulocyte percent: 60.8 %G (ref 37–80)
HEMATOCRIT: 45.3 % (ref 37.7–47.9)
HEMOGLOBIN: 14.2 g/dL (ref 12.2–16.2)
LYMPH, POC: 1.8 (ref 0.6–3.4)
MCH: 27.4 pg (ref 27–31.2)
MCHC: 31.5 g/dL — AB (ref 31.8–35.4)
MCV: 87.2 fL (ref 80–97)
MID (cbc): 0.3 (ref 0–0.9)
MPV: 7.3 fL (ref 0–99.8)
POC GRANULOCYTE: 3.3 (ref 2–6.9)
POC LYMPH PERCENT: 33.2 %L (ref 10–50)
POC MID %: 6 %M (ref 0–12)
Platelet Count, POC: 211 10*3/uL (ref 142–424)
RBC: 5.19 M/uL (ref 4.04–5.48)
RDW, POC: 13.9 %
WBC: 5.5 10*3/uL (ref 4.6–10.2)

## 2015-07-05 LAB — BASIC METABOLIC PANEL WITH GFR
BUN: 11 mg/dL (ref 7–25)
CHLORIDE: 102 mmol/L (ref 98–110)
CO2: 29 mmol/L (ref 20–31)
CREATININE: 0.6 mg/dL (ref 0.60–0.93)
Calcium: 9.6 mg/dL (ref 8.6–10.4)
GFR, Est Non African American: >89 mL/min (ref >=60)
Glucose, Bld: 110 mg/dL — ABNORMAL HIGH (ref 65–99)
Potassium: 4.2 mmol/L (ref 3.5–5.3)
SODIUM: 140 mmol/L (ref 135–146)

## 2015-07-05 LAB — MAGNESIUM: MAGNESIUM: 1.9 mg/dL (ref 1.5–2.5)

## 2015-07-05 NOTE — Patient Instructions (Signed)
Palpitations °A palpitation is the feeling that your heartbeat is irregular or is faster than normal. It may feel like your heart is fluttering or skipping a beat. Palpitations are usually not a serious problem. However, in some cases, you may need further medical evaluation. °CAUSES  °Palpitations can be caused by: °· Smoking. °· Caffeine or other stimulants, such as diet pills or energy drinks. °· Alcohol. °· Stress and anxiety. °· Strenuous physical activity. °· Fatigue. °· Certain medicines. °· Heart disease, especially if you have a history of irregular heart rhythms (arrhythmias), such as atrial fibrillation, atrial flutter, or supraventricular tachycardia. °· An improperly working pacemaker or defibrillator. °DIAGNOSIS  °To find the cause of your palpitations, your health care Hieu Herms will take your medical history and perform a physical exam. Your health care Ramello Cordial may also have you take a test called an ambulatory electrocardiogram (ECG). An ECG records your heartbeat patterns over a 24-hour period. You may also have other tests, such as: °· Transthoracic echocardiogram (TTE). During echocardiography, sound waves are used to evaluate how blood flows through your heart. °· Transesophageal echocardiogram (TEE). °· Cardiac monitoring. This allows your health care Twain Stenseth to monitor your heart rate and rhythm in real time. °· Holter monitor. This is a portable device that records your heartbeat and can help diagnose heart arrhythmias. It allows your health care Hussein Macdougal to track your heart activity for several days, if needed. °· Stress tests by exercise or by giving medicine that makes the heart beat faster. °TREATMENT  °Treatment of palpitations depends on the cause of your symptoms and can vary greatly. Most cases of palpitations do not require any treatment other than time, relaxation, and monitoring your symptoms. Other causes, such as atrial fibrillation, atrial flutter, or supraventricular  tachycardia, usually require further treatment. °HOME CARE INSTRUCTIONS  °· Avoid: °¨ Caffeinated coffee, tea, soft drinks, diet pills, and energy drinks. °¨ Chocolate. °¨ Alcohol. °· Stop smoking if you smoke. °· Reduce your stress and anxiety. Things that can help you relax include: °¨ A method of controlling things in your body, such as your heartbeats, with your mind (biofeedback). °¨ Yoga. °¨ Meditation. °¨ Physical activity such as swimming, jogging, or walking. °· Get plenty of rest and sleep. °SEEK MEDICAL CARE IF:  °· You continue to have a fast or irregular heartbeat beyond 24 hours. °· Your palpitations occur more often. °SEEK IMMEDIATE MEDICAL CARE IF: °· You have chest pain or shortness of breath. °· You have a severe headache. °· You feel dizzy or you faint. °MAKE SURE YOU: °· Understand these instructions. °· Will watch your condition. °· Will get help right away if you are not doing well or get worse. °Document Released: 09/20/2000 Document Revised: 09/28/2013 Document Reviewed: 11/22/2011 °ExitCare® Patient Information ©2015 [employer]. This information is not intended to replace advice given to you by your health care Taaliyah Delpriore. Make sure you discuss any questions you have with your health care Jaylen Knope. ° °

## 2015-07-05 NOTE — Progress Notes (Addendum)
This chart was scribed for Darlyne Russian, MD by Ladene Artist, ED Scribe. The patient was seen in room 9. Patient's care was started at 8:41 AM.  Chief Complaint:  Chief Complaint  Patient presents with  . Palpitations    HPI: [name] is a 74 y.o. female who reports to [facility] today complaining of heart palpations yesterday. Pt states that she had a colonoscopy done yesterday and the physician noticed irregular skipped beats. She was advised to follow-up with her PCP for an EKG. Pt states that she did not notice heart palpitations or any other symptoms. No h/o cardiac related illnesses or BP medications. She denies caffeine consumption. Pt's BP on examination is 138/76 on the left.   No past medical history on file. Past Surgical History  Procedure Laterality Date  . Appendectomy    . Knee arthroscopy w/ meniscal repair Left    Social History   Social History  . Marital Status: Married    Spouse Name: N/A  . Number of Children: N/A  . Years of Education: N/A   Social History Main Topics  . Smoking status: Former Smoker    Quit date: 12/28/1986  . Smokeless tobacco: None  . Alcohol Use: None  . Drug Use: None  . Sexual Activity: Not Asked   Other Topics Concern  . None   Social History Narrative   No family history on file. No Known Allergies Prior to Admission medications   Medication Sig Start Date End Date Taking? Authorizing Provider  acetaminophen-codeine (TYLENOL #3) 300-30 MG per tablet Take 1 tablet by mouth every 6 (six) hours as needed for moderate pain. Or cough 02/14/15  Yes Gay Filler Copland, MD  aspirin 81 MG tablet Take 81 mg by mouth daily.   Yes Historical Provider, MD  calcium carbonate 200 MG capsule Take 600 mg by mouth 2 (two) times daily with a meal.   Yes Historical Provider, MD  chlorhexidine (PERIDEX) 0.12 % solution  01/24/14  Yes Historical Provider, MD  fluticasone (FLONASE) 50 MCG/ACT nasal spray Place 2 sprays into the nose daily.    Yes Historical Provider, MD  Multiple Vitamins-Minerals (MULTIVITAMIN WITH MINERALS) tablet Take 1 tablet by mouth daily.   Yes Historical Provider, MD  oxybutynin (DITROPAN) 5 MG tablet Take 5 mg by mouth 2 (two) times daily.    Yes Historical Provider, MD  traMADol (ULTRAM) 50 MG tablet Take 1 tablet (50 mg total) by mouth every 6 (six) hours as needed. 07/03/15  Yes Darlyne Russian, MD     ROS: The patient denies fevers, chills, night sweats, unintentional weight loss, chest pain, wheezing, dyspnea on exertion, nausea, vomiting, abdominal pain, dysuria, hematuria, melena, numbness, weakness, or tingling. + palpitations  All other systems have been reviewed and were otherwise negative with the exception of those mentioned in the HPI and as above.    PHYSICAL EXAM: Filed Vitals:   07/05/15 0825  BP: 122/72  Pulse: 85  Temp: 98.3 F (36.8 C)  Resp: 17   Body mass index is 26.96 kg/(m^2).   BP on examination: 138/76 on the left   General: Alert, no acute distress HEENT:  Normocephalic, atraumatic, oropharynx patent. Eye: EOMI, [facility] Cardiovascular: Fairly frequent skipped beats. Regular rhythm. No rubs, murmurs or gallops.  No Carotid bruits, radial pulse intact. No pedal edema.  Respiratory: Clear to auscultation bilaterally.  No wheezes, rales, or rhonchi.  No cyanosis, no use of accessory musculature Abdominal: No organomegaly, abdomen is  soft and non-tender, positive bowel sounds.  No masses. Musculoskeletal: Gait intact. No edema, tenderness Skin: No rashes. Neurologic: Facial musculature symmetric. Psychiatric: Patient acts appropriately throughout our interaction. Lymphatic: No cervical or submandibular lymphadenopathy  LABS:   EKG/XRAY:   Primary read interpreted by Dr. Everlene Farrier at [facility]. EKG is normal sinus rhythm.   ASSESSMENT/PLan By signing my name below, I, Essence Howell, attest that this documentation has been prepared under the direction and in the presence of  Darlyne Russian, MD. Electronically Signed: Ladene Artist, Medical Scribe 07/05/2015 at 8:41 AM. Baseline EKG is normal. Referral made to cardiology. I asked her to go by the GI office and get me copies of the strips where they saw her cardiac irregularity.I personally performed the services described in this documentation, which was scribed in my presence. The recorded information has been reviewed and is accurate.    Gross sideeffects, risk and benefits, and alternatives of medications d/w patient. Patient is aware that all medications have potential sideeffects and we are unable to predict every sideeffect or drug-drug interaction that may occur.  Arlyss Queen MD 07/05/2015 8:41 AM

## 2015-07-06 ENCOUNTER — Encounter: Payer: Self-pay | Admitting: Family Medicine

## 2015-07-11 ENCOUNTER — Encounter: Payer: Self-pay | Admitting: Emergency Medicine

## 2015-07-12 ENCOUNTER — Telehealth: Payer: Self-pay

## 2015-07-12 NOTE — Telephone Encounter (Signed)
Her last blood sugar was 110. We can associate a order for test strips and a glucometer with hyperglycemia. Please place that order.

## 2015-07-12 NOTE — Telephone Encounter (Signed)
Pt was seen recently and told that her blood sugar was 150 but when she got her avs and the copy of her labwork in the mail it was listed as 110  She is wanting to know can dr Everlene Farrier prescribe patient a meter and test strips so that she can monitor the levels

## 2015-07-13 ENCOUNTER — Ambulatory Visit: Payer: PPO

## 2015-07-13 MED ORDER — BLOOD GLUCOSE METER KIT: PACK | Status: DC

## 2015-07-13 NOTE — Telephone Encounter (Signed)
Done

## 2015-07-19 ENCOUNTER — Ambulatory Visit
Admission: RE | Admit: 2015-07-19 | Discharge: 2015-07-19 | Disposition: A | Discharge status: Home / Self Care | Payer: PPO | Source: Ambulatory Visit

## 2015-07-19 DIAGNOSIS — Z1231 Encounter for screening mammogram for malignant neoplasm of breast: Secondary | ICD-10-CM

## 2015-08-04 ENCOUNTER — Other Ambulatory Visit: Payer: Self-pay | Admitting: Urgent Care

## 2015-08-04 NOTE — Progress Notes (Signed)
Thank you! I had gotten a correspondence for Dr. Everlene Farrier recommending to proceed with this. Good to know it's done.

## 2015-08-04 NOTE — Progress Notes (Signed)
Please check with patient to see if she has referral to cardiology for atrial tachycardia as seen by Dr. Irven Shelling office. Correspondence was sent to Dr. Everlene Farrier recently. Thank you!

## 2015-08-04 NOTE — Progress Notes (Signed)
[name],   Yes she has been to the cardiology office, her last visit there was last Thursday or Friday.

## 2015-09-07 ENCOUNTER — Encounter: Payer: Self-pay | Admitting: *Deleted

## 2015-10-11 DIAGNOSIS — M25561 Pain in right knee: Secondary | ICD-10-CM | POA: Diagnosis not present

## 2015-11-09 ENCOUNTER — Other Ambulatory Visit: Payer: Self-pay | Admitting: Orthopaedic Surgery

## 2015-11-09 DIAGNOSIS — M25561 Pain in right knee: Secondary | ICD-10-CM

## 2015-11-15 ENCOUNTER — Ambulatory Visit
Admission: RE | Admit: 2015-11-15 | Discharge: 2015-11-15 | Disposition: A | Discharge status: Home / Self Care | Payer: PPO | Source: Ambulatory Visit | Attending: Orthopaedic Surgery | Admitting: Orthopaedic Surgery

## 2015-11-15 DIAGNOSIS — S83241A Other tear of medial meniscus, current injury, right knee, initial encounter: Secondary | ICD-10-CM | POA: Diagnosis not present

## 2015-11-15 DIAGNOSIS — M25561 Pain in right knee: Secondary | ICD-10-CM

## 2015-11-20 DIAGNOSIS — M25561 Pain in right knee: Secondary | ICD-10-CM | POA: Diagnosis not present

## 2015-11-30 DIAGNOSIS — S83241D Other tear of medial meniscus, current injury, right knee, subsequent encounter: Secondary | ICD-10-CM | POA: Diagnosis not present

## 2015-11-30 DIAGNOSIS — G8918 Other acute postprocedural pain: Secondary | ICD-10-CM | POA: Diagnosis not present

## 2015-11-30 DIAGNOSIS — S83241A Other tear of medial meniscus, current injury, right knee, initial encounter: Secondary | ICD-10-CM | POA: Diagnosis not present

## 2015-12-29 DIAGNOSIS — H2513 Age-related nuclear cataract, bilateral: Secondary | ICD-10-CM | POA: Diagnosis not present

## 2016-01-23 ENCOUNTER — Ambulatory Visit (INDEPENDENT_AMBULATORY_CARE_PROVIDER_SITE_OTHER): Payer: PPO | Admitting: Family Medicine

## 2016-01-23 ENCOUNTER — Ambulatory Visit (INDEPENDENT_AMBULATORY_CARE_PROVIDER_SITE_OTHER): Payer: PPO

## 2016-01-23 VITALS — BP 124/70 | HR 74 | Temp 97.9°F | Resp 18 | Ht 62.0 in | Wt 153.0 lb

## 2016-01-23 DIAGNOSIS — M5442 Lumbago with sciatica, left side: Secondary | ICD-10-CM

## 2016-01-23 DIAGNOSIS — M545 Low back pain: Secondary | ICD-10-CM | POA: Diagnosis not present

## 2016-01-23 MED ORDER — CYCLOBENZAPRINE HCL 5 MG PO TABS: 5.0000 mg | ORAL_TABLET | Freq: Every day | ORAL | Status: DC

## 2016-01-23 MED ORDER — DICLOFENAC SODIUM 75 MG PO TBEC: 75.0000 mg | DELAYED_RELEASE_TABLET | Freq: Two times a day (BID) | ORAL | Status: DC

## 2016-01-23 NOTE — Progress Notes (Signed)
This is 75 year old woman who comes in with left lower back, left buttock, and left thigh pain that began last Friday. She was exiting her car and as she twisted she felt a sudden pain which is really been present since then. She's had no giving way of her left leg but the pain is been rather persistent.  Patient's had no fever, no urinary symptoms, and no bowel symptoms. She's had sciatica in her right leg but never in her left leg.  Objective:BP 124/70 mmHg  Pulse 74  Temp(Src) 97.9 F (36.6 C) (Oral)  Resp 18  Ht 5\' 2"  (1.575 m)  Wt 153 lb (69.4 kg)  BMI 27.98 kg/m2  SpO2 98% Patient alert and cooperative sitting in exam room. She is in no acute distress Straight-leg raising on the left is normal Palpation of the entire left flank and left lumbar area is nontender. There is no scoliosis or bony abnormality noted, there is no rash on her back. 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 There is a calcified aorta which is not enlarged.  Assessment: Patient has symptoms consistent with sciatica syndrome. This is not severe, but it obviously is bothering her.  Left-sided low back pain with left-sided sciatica - Plan: DG Lumbar Spine 2-3 Views, diclofenac (VOLTAREN) 75 MG EC tablet, cyclobenzaprine (FLEXERIL) 5 MG tablet  Signed, Carola Frost.D.

## 2016-01-23 NOTE — Patient Instructions (Addendum)
Your x-ray shows very mild arthritis but no obvious disc disease or osteoporosis. Therefore we are going with a strong anti-inflammatory and muscle relaxer at night. If you're not getting better in 3 days, please return and let us reexamine situation    IF you received an x-ray today, you will receive an invoice from [facility]. Please contact [facility] at [phone] with questions or concerns regarding your invoice.   IF you received labwork today, you will receive an invoice from [employer]. Please contact [facility] at [phone] with questions or concerns regarding your invoice.   Our billing staff will not be able to assist you with questions regarding bills from these companies.  You will be contacted with the lab results as soon as they are available. The fastest way to get your results is to activate your My Chart account. Instructions are located on the last page of this paperwork. If you have not heard from [place] regarding the results in 2 weeks, please contact this office.

## 2016-02-06 DIAGNOSIS — J302 Other seasonal allergic rhinitis: Secondary | ICD-10-CM | POA: Diagnosis not present

## 2016-02-06 DIAGNOSIS — Z85819 Personal history of malignant neoplasm of unspecified site of lip, oral cavity, and pharynx: Secondary | ICD-10-CM | POA: Diagnosis not present

## 2016-02-19 DIAGNOSIS — M7051 Other bursitis of knee, right knee: Secondary | ICD-10-CM | POA: Diagnosis not present

## 2016-02-24 ENCOUNTER — Ambulatory Visit (INDEPENDENT_AMBULATORY_CARE_PROVIDER_SITE_OTHER): Payer: PPO | Admitting: Family Medicine

## 2016-02-24 VITALS — BP 134/66 | HR 86 | Temp 98.7°F | Resp 16 | Ht 62.0 in | Wt 149.0 lb

## 2016-02-24 DIAGNOSIS — H6123 Impacted cerumen, bilateral: Secondary | ICD-10-CM

## 2016-02-24 DIAGNOSIS — R059 Cough, unspecified: Secondary | ICD-10-CM

## 2016-02-24 DIAGNOSIS — R05 Cough: Secondary | ICD-10-CM | POA: Diagnosis not present

## 2016-02-24 MED ORDER — AZITHROMYCIN 250 MG PO TABS: ORAL_TABLET | ORAL | Status: DC

## 2016-02-24 MED ORDER — HYDROCODONE-HOMATROPINE 5-1.5 MG/5ML PO SYRP: 5.0000 mL | ORAL_SOLUTION | Freq: Three times a day (TID) | ORAL | Status: DC | PRN

## 2016-02-24 NOTE — Patient Instructions (Addendum)

## 2016-02-24 NOTE — Progress Notes (Signed)
This is a 75 year old woman who comes in with 3 days of cough which is nonproductive. She's had no fever. She's had no sore throat.  In addition patient has some decreased hearing with history of cerumen impaction.  Objective:BP 134/66 mmHg  Pulse 86  Temp(Src) 98.7 F (37.1 C)  Resp 16  Ht 5\' 2"  (1.575 m)  Wt 149 lb (67.586 kg)  BMI 27.25 kg/m2  SpO2 98% Chest exam shows coarse breath sounds on expiration Heart: Regular no murmur HEENT: Unremarkable except for bilateral cerumen impaction  The ears were irrigated and patient experienced some pain with her right ear irrigated. Most of the wax was removed from both ears.  Assessment:Cough - Plan: HYDROcodone-homatropine (HYCODAN) 5-1.5 MG/5ML syrup, azithromycin (ZITHROMAX) 250 MG tablet  Cerumen impaction, bilateral - Plan: Ear wax removal  Carola Frost.D.

## 2016-02-26 ENCOUNTER — Ambulatory Visit (INDEPENDENT_AMBULATORY_CARE_PROVIDER_SITE_OTHER): Payer: PPO | Admitting: Family Medicine

## 2016-02-26 ENCOUNTER — Encounter: Payer: Self-pay | Admitting: Family Medicine

## 2016-02-26 VITALS — BP 140/60 | HR 76 | Temp 97.6°F | Resp 16 | Wt 150.2 lb

## 2016-02-26 DIAGNOSIS — H6123 Impacted cerumen, bilateral: Secondary | ICD-10-CM

## 2016-02-26 NOTE — Progress Notes (Signed)
This a 75 year old woman that was in 2 days ago for a cerumen impaction. She had some pain in the right ear so he stopped lavaging the ear and told her to use Debrox. She followed directions but ended up having a stopped up ear. She has no pain now but can't hear out of her right ear.  Objective:BP 140/60 mmHg  Pulse 76  Temp(Src) 97.6 F (36.4 C) (Oral)  Resp 16  Wt 150 lb 3.2 oz (68.13 kg)  SpO2 99% I lavaging the ear clear of cerumen and she can hear now.  Assessment: Cerumen impaction, right ear-resolved  Signed, Carola Frost.D.

## 2016-04-10 DIAGNOSIS — M25561 Pain in right knee: Secondary | ICD-10-CM | POA: Diagnosis not present

## 2016-04-16 DIAGNOSIS — M25561 Pain in right knee: Secondary | ICD-10-CM | POA: Diagnosis not present

## 2016-04-18 DIAGNOSIS — M25561 Pain in right knee: Secondary | ICD-10-CM | POA: Diagnosis not present

## 2016-04-22 DIAGNOSIS — M25561 Pain in right knee: Secondary | ICD-10-CM | POA: Diagnosis not present

## 2016-04-25 DIAGNOSIS — M25561 Pain in right knee: Secondary | ICD-10-CM | POA: Diagnosis not present

## 2016-04-29 DIAGNOSIS — M25561 Pain in right knee: Secondary | ICD-10-CM | POA: Diagnosis not present

## 2016-05-01 DIAGNOSIS — M25561 Pain in right knee: Secondary | ICD-10-CM | POA: Diagnosis not present

## 2016-05-03 DIAGNOSIS — M545 Low back pain: Secondary | ICD-10-CM | POA: Diagnosis not present

## 2016-05-06 DIAGNOSIS — M25561 Pain in right knee: Secondary | ICD-10-CM | POA: Diagnosis not present

## 2016-05-09 DIAGNOSIS — M25561 Pain in right knee: Secondary | ICD-10-CM | POA: Diagnosis not present

## 2016-05-15 DIAGNOSIS — M25561 Pain in right knee: Secondary | ICD-10-CM | POA: Diagnosis not present

## 2016-05-17 DIAGNOSIS — M25561 Pain in right knee: Secondary | ICD-10-CM | POA: Diagnosis not present

## 2016-06-07 ENCOUNTER — Telehealth: Payer: Self-pay

## 2016-06-07 NOTE — Telephone Encounter (Signed)
Patient stated she received a message through [facility] stating she need a Shingles vaccine. Patient stated she got her shingles vaccine at [facility] in [place]. They are going fax over a copy of the vaccine.

## 2016-06-07 NOTE — Telephone Encounter (Signed)
Medical records updated with [employer]

## 2016-06-18 ENCOUNTER — Other Ambulatory Visit: Payer: Self-pay | Admitting: Obstetrics and Gynecology

## 2016-06-18 DIAGNOSIS — Z1231 Encounter for screening mammogram for malignant neoplasm of breast: Secondary | ICD-10-CM

## 2016-06-24 ENCOUNTER — Ambulatory Visit (INDEPENDENT_AMBULATORY_CARE_PROVIDER_SITE_OTHER): Payer: PPO | Admitting: Emergency Medicine

## 2016-06-24 VITALS — BP 124/80 | HR 70 | Temp 98.9°F | Resp 18 | Ht 62.0 in | Wt 160.0 lb

## 2016-06-24 DIAGNOSIS — R252 Cramp and spasm: Secondary | ICD-10-CM

## 2016-06-24 DIAGNOSIS — E785 Hyperlipidemia, unspecified: Secondary | ICD-10-CM

## 2016-06-24 DIAGNOSIS — Z23 Encounter for immunization: Secondary | ICD-10-CM | POA: Diagnosis not present

## 2016-06-24 DIAGNOSIS — Z0000 Encounter for general adult medical examination without abnormal findings: Secondary | ICD-10-CM

## 2016-06-24 LAB — POCT CBC
Granulocyte percent: 57.2 %G (ref 37–80)
HCT, POC: 40 % (ref 37.7–47.9)
Hemoglobin: 14.4 g/dL (ref 12.2–16.2)
LYMPH, POC: 1.9 (ref 0.6–3.4)
MCH: 31.1 pg (ref 27–31.2)
MCHC: 35.9 g/dL — AB (ref 31.8–35.4)
MCV: 86.6 fL (ref 80–97)
MID (CBC): 0.6 (ref 0–0.9)
MPV: 7.1 fL (ref 0–99.8)
PLATELET COUNT, POC: 163 10*3/uL (ref 142–424)
POC GRANULOCYTE: 3.3 (ref 2–6.9)
POC LYMPH PERCENT: 32.8 %L (ref 10–50)
POC MID %: 10 % (ref 0–12)
RBC: 4.62 M/uL (ref 4.04–5.48)
RDW, POC: 13.1 %
WBC: 5.7 10*3/uL (ref 4.6–10.2)

## 2016-06-24 LAB — COMPLETE METABOLIC PANEL WITH GFR
ALT: 14 U/L (ref 6–29)
AST: 19 U/L (ref 10–35)
Albumin: 4.2 g/dL (ref 3.6–5.1)
Alkaline Phosphatase: 64 U/L (ref 33–130)
BILIRUBIN TOTAL: 0.4 mg/dL (ref 0.2–1.2)
BUN: 17 mg/dL (ref 7–25)
CALCIUM: 9.5 mg/dL (ref 8.6–10.4)
CO2: 26 mmol/L (ref 20–31)
CREATININE: 0.59 mg/dL — AB (ref 0.60–0.93)
Chloride: 102 mmol/L (ref 98–110)
GFR, Est Non African American: >89 mL/min (ref >=60)
Glucose, Bld: 98 mg/dL (ref 65–99)
Potassium: 4.2 mmol/L (ref 3.5–5.3)
Sodium: 139 mmol/L (ref 135–146)
TOTAL PROTEIN: 6.7 g/dL (ref 6.1–8.1)

## 2016-06-24 LAB — LIPID PANEL
CHOLESTEROL: 178 mg/dL (ref 125–200)
HDL: 59 mg/dL (ref >=46)
LDL CALC: 98 mg/dL (ref <130)
TRIGLYCERIDES: 104 mg/dL (ref <150)
Total CHOL/HDL Ratio: 3 Ratio (ref <=5.0)
VLDL: 21 mg/dL (ref <30)

## 2016-06-24 LAB — POCT URINALYSIS DIP (MANUAL ENTRY)
BILIRUBIN UA: NEGATIVE
BILIRUBIN UA: NEGATIVE
Blood, UA: NEGATIVE
Glucose, UA: NEGATIVE
LEUKOCYTES UA: NEGATIVE
Nitrite, UA: NEGATIVE
Protein Ur, POC: NEGATIVE
Spec Grav, UA: 1.015
Urobilinogen, UA: 0.2
pH, UA: 6.5

## 2016-06-24 LAB — MAGNESIUM: Magnesium: 1.9 mg/dL (ref 1.5–2.5)

## 2016-06-24 NOTE — Patient Instructions (Addendum)
   IF you received an x-ray today, you will receive an invoice from [facility]. Please contact [facility] at [phone] with questions or concerns regarding your invoice.   IF you received labwork today, you will receive an invoice from [facility]. Please contact [facility] at [phone] with questions or concerns regarding your invoice.   Our billing staff will not be able to assist you with questions regarding bills from these companies.  You will be contacted with the lab results as soon as they are available. The fastest way to get your results is to activate your My Chart account. Instructions are located on the last page of this paperwork. If you have not heard from us regarding the results in 2 weeks, please contact this office.    Influenza (Flu) Vaccine (Inactivated or Recombinant):  1. Why get vaccinated? Influenza ("flu") is a contagious disease that spreads around the [place] every year, usually between October and May. Flu is caused by influenza viruses, and is spread mainly by coughing, sneezing, and close contact. Anyone can get flu. Flu strikes suddenly and can last several days. Symptoms vary by age, but can include:  fever/chills  sore throat  muscle aches  fatigue  cough  headache  runny or stuffy nose Flu can also lead to pneumonia and blood infections, and cause diarrhea and seizures in children. If you have a medical condition, such as heart or lung disease, flu can make it worse. Flu is more dangerous for some people. Infants and young children, people 65 years of age and older, pregnant women, and people with certain health conditions or a weakened immune system are at greatest risk. Each year thousands of people in the [place] die from flu, and many more are hospitalized. Flu vaccine can:  keep you from getting flu,  make flu less severe if you do get it, and  keep you from spreading flu to  your family and other people. 2. Inactivated and recombinant flu vaccines A dose of flu vaccine is recommended every flu season. Children 6 months through 8 years of age may need two doses during the same flu season. Everyone else needs only one dose each flu season. Some inactivated flu vaccines contain a very small amount of a mercury-based preservative called thimerosal. Studies have not shown thimerosal in vaccines to be harmful, but flu vaccines that do not contain thimerosal are available. There is no live flu virus in flu shots. They cannot cause the flu. There are many flu viruses, and they are always changing. Each year a new flu vaccine is made to protect against three or four viruses that are likely to cause disease in the upcoming flu season. But even when the vaccine doesn't exactly match these viruses, it may still provide some protection. Flu vaccine cannot prevent:  flu that is caused by a virus not covered by the vaccine, or  illnesses that look like flu but are not. It takes about 2 weeks for protection to develop after vaccination, and protection lasts through the flu season. 3. Some people should not get this vaccine Tell the person who is giving you the vaccine:  If you have any severe, life-threatening allergies. If you ever had a life-threatening allergic reaction after a dose of flu vaccine, or have a severe allergy to any part of this vaccine, you may be advised not to get vaccinated. Most, but not all, types of flu vaccine contain a small amount of egg protein.    If you ever had Guillain-Barre Syndrome (also called GBS). Some people with a history of GBS should not get this vaccine. This should be discussed with your doctor.  If you are not feeling well. It is usually okay to get flu vaccine when you have a mild illness, but you might be asked to come back when you feel better. 4. Risks of a vaccine reaction With any medicine, including vaccines, there is a chance of  reactions. These are usually mild and go away on their own, but serious reactions are also possible. Most people who get a flu shot do not have any problems with it. Minor problems following a flu shot include:  soreness, redness, or swelling where the shot was given  hoarseness  sore, red or itchy eyes  cough  fever  aches  headache  itching  fatigue If these problems occur, they usually begin soon after the shot and last 1 or 2 days. More serious problems following a flu shot can include the following:  There may be a small increased risk of Guillain-Barre Syndrome (GBS) after inactivated flu vaccine. This risk has been estimated at 1 or 2 additional cases per million people vaccinated. This is much lower than the risk of severe complications from flu, which can be prevented by flu vaccine.  Young children who get the flu shot along with pneumococcal vaccine (PCV13) and/or DTaP vaccine at the same time might be slightly more likely to have a seizure caused by fever. Ask your doctor for more information. Tell your doctor if a child who is getting flu vaccine has ever had a seizure. Problems that could happen after any injected vaccine:  People sometimes faint after a medical procedure, including vaccination. Sitting or lying down for about 15 minutes can help prevent fainting, and injuries caused by a fall. Tell your doctor if you feel dizzy, or have vision changes or ringing in the ears.  Some people get severe pain in the shoulder and have difficulty moving the arm where a shot was given. This happens very rarely.  Any medication can cause a severe allergic reaction. Such reactions from a vaccine are very rare, estimated at about 1 in a million doses, and would happen within a few minutes to a few hours after the vaccination. As with any medicine, there is a very remote chance of a vaccine causing a serious injury or death. The safety of vaccines is always being monitored. For  more information, visit: www.cdc.gov/vaccinesafety/ 5. What if there is a serious reaction? What should I look for?  Look for anything that concerns you, such as signs of a severe allergic reaction, very high fever, or unusual behavior. Signs of a severe allergic reaction can include hives, swelling of the face and throat, difficulty breathing, a fast heartbeat, dizziness, and weakness. These would start a few minutes to a few hours after the vaccination. What should I do?  If you think it is a severe allergic reaction or other emergency that can't wait, call 9-1-1 and get the person to the nearest hospital. Otherwise, call your doctor.  Reactions should be reported to the Vaccine Adverse Event Reporting System (VAERS). Your doctor should file this report, or you can do it yourself through the VAERS web site at www.vaers.hhs.gov, or by calling 1-800-822-7967. VAERS does not give medical advice. 6. The National Vaccine Injury Compensation Program The National Vaccine Injury Compensation Program (VICP) is a federal program that was created to compensate people who may have been   injured by certain vaccines. Persons who believe they may have been injured by a vaccine can learn about the program and about filing a claim by calling 1-800-338-2382 or visiting the VICP website at www.hrsa.gov/vaccinecompensation. There is a time limit to file a claim for compensation. 7. How can I learn more?  Ask your healthcare provider. He or she can give you the vaccine package insert or suggest other sources of information.  Call your local or state health department.  Contact the Centers for Disease Control and Prevention (CDC):  Call 1-800-232-4636 (1-800-CDC-INFO) or  Visit CDC's website at www.cdc.gov/flu Vaccine Information Statement Inactivated Influenza Vaccine (05/13/2014)   This information is not intended to replace advice given to you by your health care provider. Make sure you discuss any  questions you have with your health care provider.   Document Released: 07/18/2006 Document Revised: 10/14/2014 Document Reviewed: 05/16/2014 Elsevier Interactive Patient Education 2016 [employer] Menopause is a normal process in which your reproductive ability comes to an end. This process happens gradually over a span of months to years, usually between the ages of 48 and 55. Menopause is complete when you have missed 12 consecutive menstrual periods. It is important to talk with your health care provider about some of the most common conditions that affect postmenopausal women, such as heart disease, cancer, and bone loss (osteoporosis). Adopting a healthy lifestyle and getting preventive care can help to promote your health and wellness. Those actions can also lower your chances of developing some of these common conditions. WHAT SHOULD I KNOW ABOUT MENOPAUSE? During menopause, you may experience a number of symptoms, such as:  Moderate-to-severe hot flashes.  Night sweats.  Decrease in sex drive.  Mood swings.  Headaches.  Tiredness.  Irritability.  Memory problems.  Insomnia. Choosing to treat or not to treat menopausal changes is an individual decision that you make with your health care provider. WHAT SHOULD I KNOW ABOUT HORMONE REPLACEMENT THERAPY AND SUPPLEMENTS? Hormone therapy products are effective for treating symptoms that are associated with menopause, such as hot flashes and night sweats. Hormone replacement carries certain risks, especially as you become older. If you are thinking about using estrogen or estrogen with progestin treatments, discuss the benefits and risks with your health care provider. WHAT SHOULD I KNOW ABOUT HEART DISEASE AND STROKE? Heart disease, heart attack, and stroke become more likely as you age. This may be due, in part, to the hormonal changes that your body experiences during menopause. These can affect how your body processes dietary  fats, triglycerides, and cholesterol. Heart attack and stroke are both medical emergencies. There are many things that you can do to help prevent heart disease and stroke:  Have your blood pressure checked at least every 1-2 years. High blood pressure causes heart disease and increases the risk of stroke.  If you are 55-79 years old, ask your health care provider if you should take aspirin to prevent a heart attack or a stroke.  Do not use any tobacco products, including cigarettes, chewing tobacco, or electronic cigarettes. If you need help quitting, ask your health care provider.  It is important to eat a healthy diet and maintain a healthy weight.  Be sure to include plenty of vegetables, fruits, low-fat dairy products, and lean protein.  Avoid eating foods that are high in solid fats, added sugars, or salt (sodium).  Get regular exercise. This is one of the most important things that you can do for your health.  Try to   exercise for at least 150 minutes each week. The type of exercise that you do should increase your heart rate and make you sweat. This is known as moderate-intensity exercise.  Try to do strengthening exercises at least twice each week. Do these in addition to the moderate-intensity exercise.  Know your numbers.Ask your health care provider to check your cholesterol and your blood glucose. Continue to have your blood tested as directed by your health care provider. WHAT SHOULD I KNOW ABOUT CANCER SCREENING? There are several types of cancer. Take the following steps to reduce your risk and to catch any cancer development as early as possible. Breast Cancer  Practice breast self-awareness.  This means understanding how your breasts normally appear and feel.  It also means doing regular breast self-exams. Let your health care provider know about any changes, no matter how small.  If you are 40 or older, have a clinician do a breast exam (clinical breast exam or CBE)  every year. Depending on your age, family history, and medical history, it may be recommended that you also have a yearly breast X-ray (mammogram).  If you have a family history of breast cancer, talk with your health care provider about genetic screening.  If you are at high risk for breast cancer, talk with your health care provider about having an MRI and a mammogram every year.  Breast cancer (BRCA) gene test is recommended for women who have family members with BRCA-related cancers. Results of the assessment will determine the need for genetic counseling and BRCA1 and for BRCA2 testing. BRCA-related cancers include these types:  Breast. This occurs in males or females.  Ovarian.  Tubal. This may also be called fallopian tube cancer.  Cancer of the abdominal or pelvic lining (peritoneal cancer).  Prostate.  Pancreatic. Cervical, Uterine, and Ovarian Cancer Your health care provider may recommend that you be screened regularly for cancer of the pelvic organs. These include your ovaries, uterus, and vagina. This screening involves a pelvic exam, which includes checking for microscopic changes to the surface of your cervix (Pap test).  For women ages 21-65, health care providers may recommend a pelvic exam and a Pap test every three years. For women ages 30-65, they may recommend the Pap test and pelvic exam, combined with testing for human papilloma virus (HPV), every five years. Some types of HPV increase your risk of cervical cancer. Testing for HPV may also be done on women of any age who have unclear Pap test results.  Other health care providers may not recommend any screening for nonpregnant women who are considered low risk for pelvic cancer and have no symptoms. Ask your health care provider if a screening pelvic exam is right for you.  If you have had past treatment for cervical cancer or a condition that could lead to cancer, you need Pap tests and screening for cancer for at  least 20 years after your treatment. If Pap tests have been discontinued for you, your risk factors (such as having a new sexual partner) need to be reassessed to determine if you should start having screenings again. Some women have medical problems that increase the chance of getting cervical cancer. In these cases, your health care provider may recommend that you have screening and Pap tests more often.  If you have a family history of uterine cancer or ovarian cancer, talk with your health care provider about genetic screening.  If you have vaginal bleeding after reaching menopause, tell your health care   provider.  There are currently no reliable tests available to screen for ovarian cancer. Lung Cancer Lung cancer screening is recommended for adults 55-80 years old who are at high risk for lung cancer because of a history of smoking. A yearly low-dose CT scan of the lungs is recommended if you:  Currently smoke.  Have a history of at least 30 pack-years of smoking and you currently smoke or have quit within the past 15 years. A pack-year is smoking an average of one pack of cigarettes per day for one year. Yearly screening should:  Continue until it has been 15 years since you quit.  Stop if you develop a health problem that would prevent you from having lung cancer treatment. Colorectal Cancer  This type of cancer can be detected and can often be prevented.  Routine colorectal cancer screening usually begins at age 50 and continues through age 75.  If you have risk factors for colon cancer, your health care provider may recommend that you be screened at an earlier age.  If you have a family history of colorectal cancer, talk with your health care provider about genetic screening.  Your health care provider may also recommend using home test kits to check for hidden blood in your stool.  A small camera at the end of a tube can be used to examine your colon directly (sigmoidoscopy  or colonoscopy). This is done to check for the earliest forms of colorectal cancer.  Direct examination of the colon should be repeated every 5-10 years until age 75. However, if early forms of precancerous polyps or small growths are found or if you have a family history or genetic risk for colorectal cancer, you may need to be screened more often. Skin Cancer  Check your skin from head to toe regularly.  Monitor any moles. Be sure to tell your health care provider:  About any new moles or changes in moles, especially if there is a change in a mole's shape or color.  If you have a mole that is larger than the size of a pencil eraser.  If any of your family members has a history of skin cancer, especially at a young age, talk with your health care provider about genetic screening.  Always use sunscreen. Apply sunscreen liberally and repeatedly throughout the day.  Whenever you are outside, protect yourself by wearing long sleeves, pants, a wide-brimmed hat, and sunglasses. WHAT SHOULD I KNOW ABOUT OSTEOPOROSIS? Osteoporosis is a condition in which bone destruction happens more quickly than new bone creation. After menopause, you may be at an increased risk for osteoporosis. To help prevent osteoporosis or the bone fractures that can happen because of osteoporosis, the following is recommended:  If you are 19-50 years old, get at least 1,000 mg of calcium and at least 600 mg of vitamin D per day.  If you are older than age 50 but younger than age 70, get at least 1,200 mg of calcium and at least 600 mg of vitamin D per day.  If you are older than age 70, get at least 1,200 mg of calcium and at least 800 mg of vitamin D per day. Smoking and excessive alcohol intake increase the risk of osteoporosis. Eat foods that are rich in calcium and vitamin D, and do weight-bearing exercises several times each week as directed by your health care provider. WHAT SHOULD I KNOW ABOUT HOW MENOPAUSE AFFECTS  MY MENTAL HEALTH? Depression may occur at any age, but it is   more common as you become older. Common symptoms of depression include:  Low or sad mood.  Changes in sleep patterns.  Changes in appetite or eating patterns.  Feeling an overall lack of motivation or enjoyment of activities that you previously enjoyed.  Frequent crying spells. Talk with your health care provider if you think that you are experiencing depression. WHAT SHOULD I KNOW ABOUT IMMUNIZATIONS? It is important that you get and maintain your immunizations. These include:  Tetanus, diphtheria, and pertussis (Tdap) booster vaccine.  Influenza every year before the flu season begins.  Pneumonia vaccine.  Shingles vaccine. Your health care provider may also recommend other immunizations.   This information is not intended to replace advice given to you by your health care provider. Make sure you discuss any questions you have with your health care provider.   Document Released: 11/15/2005 Document Revised: 10/14/2014 Document Reviewed: 05/26/2014 Elsevier Interactive Patient Education 2016 [employer]  

## 2016-06-24 NOTE — Progress Notes (Addendum)
Patient ID: [name], female   DOB: 11-Aug-1941, 75 y.o.   MRN: [id number]    By signing my name below, I, Essence Howell, attest that this documentation has been prepared under the direction and in the presence of Darlyne Russian, MD Electronically Signed: Ladene Artist, ED Scribe 06/24/2016 at 8:20 AM.  Chief Complaint:  Chief Complaint  Patient presents with  . Annual Exam    HPI: [name] is a 75 y.o. female who reports to [facility] today for an annual exam. Pt states that she has been doing well overall. She has followed up with ENT every 6 months-once a year; states that it has been 5 years since her CA. Pt's colonoscopy revealed benign polyps which were removed; she is scheduled to return in 5 years. Pt's GYN is Brien Few, MD. Pt does not follow-up with dermatology. She denies h/o skin CA or suspicious lesions.   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 She states that she has had a tear repaired in both knees by [name]. Pt walks and rides a stationary bicycle for exercise. She reports intermittent leg cramping in the mornings and expresses concerns regarding this, however, she states that she has not experienced leg cramping within the past 2-3 weeks.   Immunizations Pt has had the shingles vaccine several years ago. She agreed to receive the influenza vaccine today.   History reviewed. No pertinent past medical history. Past Surgical History:  Procedure Laterality Date  . APPENDECTOMY    . KNEE ARTHROSCOPY W/ MENISCAL REPAIR Left    Social History   Social History  . Marital status: Married    Spouse name: N/A  . Number of children: N/A  . Years of education: N/A   Social History Main Topics  . Smoking status: Former Smoker    Quit date: 12/28/1986  . Smokeless tobacco: Never Used  . Alcohol use None  . Drug use: Unknown  . Sexual activity: Not Asked   Other Topics Concern  . None   Social History  Narrative  . None   History reviewed. No pertinent family history. No Known Allergies Prior to Admission medications   Medication Sig Start Date End Date Taking? Authorizing Provider  aspirin 81 MG tablet Take 81 mg by mouth daily.   Yes Historical Provider, MD  calcium carbonate 200 MG capsule Take 600 mg by mouth 2 (two) times daily with a meal.   Yes Historical Provider, MD  fluticasone (FLONASE) 50 MCG/ACT nasal spray Place 2 sprays into the nose daily.   Yes Historical Provider, MD  meloxicam (MOBIC) 7.5 MG tablet Take 7.5 mg by mouth 2 (two) times daily.   Yes Historical Provider, MD  Multiple Vitamins-Minerals (MULTIVITAMIN WITH MINERALS) tablet Take 1 tablet by mouth daily.   Yes Historical Provider, MD  oxybutynin (DITROPAN) 5 MG tablet Take 5 mg by mouth 2 (two) times daily.    Yes Historical Provider, MD  blood glucose meter kit and supplies Dispense based on patient and insurance preference. Use up to four times daily as directed. (FOR ICD-10  R73.9). Patient not taking: Reported on 06/24/2016 07/13/15   Darlyne Russian, MD  chlorhexidine (PERIDEX) 0.12 % solution Reported on 02/24/2016 01/24/14   Historical Provider, MD    ROS: The patient denies fevers, chills, night sweats, unintentional weight loss, chest pain, palpitations, wheezing, dyspnea on exertion, nausea, vomiting, abdominal pain, dysuria, hematuria, melena, numbness, weakness, or  tingling.  All other systems have been reviewed and were otherwise negative with the exception of those mentioned in the HPI and as above.    PHYSICAL EXAM: Vitals:   06/24/16 0816  BP: 124/80  Pulse: 70  Resp: 18  Temp: 98.9 F (37.2 C)   Body mass index is 29.26 kg/m.   General: Alert, no acute distress HEENT:  Normocephalic, atraumatic, oropharynx patent. Upper and lower dentures.  Eye: Juliette Mangle [facility] Cardiovascular:  Regular rate and rhythm, no rubs murmurs or gallops.  No Carotid bruits, radial pulse intact. No pedal edema.    Respiratory: Clear to auscultation bilaterally.  No wheezes, rales, or rhonchi.  No cyanosis, no use of accessory musculature Abdominal: No organomegaly, abdomen is soft and non-tender, positive bowel sounds.  No masses. GU: Breast and pelvic exam deferred, performed by Dr. Ronita Hipps.  Musculoskeletal: Gait intact. No edema. Pt reports that she intermittently experiences LE cramping.  Skin: No rashes. Neurologic: Facial musculature symmetric. Psychiatric: Patient acts appropriately throughout our interaction. Lymphatic: No cervical or submandibular lymphadenopathy  LABS: Results for orders placed or performed in visit on 06/24/16  POCT CBC  Result Value Ref Range   WBC 5.7 4.6 - 10.2 K/uL   Lymph, poc 1.9 0.6 - 3.4   POC LYMPH PERCENT 32.8 10 - 50 %L   MID (cbc) 0.6 0 - 0.9   POC MID % 10.0 0 - 12 %M   POC Granulocyte 3.3 2 - 6.9   Granulocyte percent 57.2 37 - 80 %G   RBC 4.62 4.04 - 5.48 M/uL   Hemoglobin 14.4 12.2 - 16.2 g/dL   HCT, POC 40.0 37.7 - 47.9 %   MCV 86.6 80 - 97 fL   MCH, POC 31.1 27 - 31.2 pg   MCHC 35.9 (A) 31.8 - 35.4 g/dL   RDW, POC 13.1 %   Platelet Count, POC 163 142 - 424 K/uL   MPV 7.1 0 - 99.8 fL  POCT urinalysis dipstick  Result Value Ref Range   Color, UA yellow yellow   Clarity, UA clear clear   Glucose, UA negative negative   Bilirubin, UA negative negative   Ketones, POC UA negative negative   Spec Grav, UA 1.015    Blood, UA negative negative   pH, UA 6.5    Protein Ur, POC negative negative   Urobilinogen, UA 0.2    Nitrite, UA Negative Negative   Leukocytes, UA Negative Negative    EKG/XRAY:   Primary read interpreted by Dr. Everlene Farrier at [facility].   ASSESSMENT/PLAN: Patient maintains a healthy line style. She is physically active does not smoke or drink and exercises regularly. She does have some trouble with night cramps. Which have a magnesium, calcium, potassium level.Marland Kitchen She will follow-up at [facility] office. She states she has had  the pneumonia 23 vaccine but it is not documented she states she also has had the shingles vaccine and this is not documented. We'll continue to try and get documentation for this. She was given her flu shot today.   Gross sideeffects, risk and benefits, and alternatives of medications d/w patient. Patient is aware that all medications have potential sideeffects and we are unable to predict every sideeffect or drug-drug interaction that may occur.  Arlyss Queen MD 06/24/2016 8:19 AM

## 2016-07-10 ENCOUNTER — Ambulatory Visit (INDEPENDENT_AMBULATORY_CARE_PROVIDER_SITE_OTHER): Payer: PPO | Admitting: Physician Assistant

## 2016-07-10 DIAGNOSIS — M7051 Other bursitis of knee, right knee: Secondary | ICD-10-CM

## 2016-07-11 ENCOUNTER — Encounter: Payer: Self-pay | Admitting: Internal Medicine

## 2016-07-11 ENCOUNTER — Ambulatory Visit (INDEPENDENT_AMBULATORY_CARE_PROVIDER_SITE_OTHER): Payer: PPO | Admitting: Internal Medicine

## 2016-07-11 DIAGNOSIS — M170 Bilateral primary osteoarthritis of knee: Secondary | ICD-10-CM | POA: Diagnosis not present

## 2016-07-11 DIAGNOSIS — J301 Allergic rhinitis due to pollen: Secondary | ICD-10-CM

## 2016-07-11 DIAGNOSIS — N393 Stress incontinence (female) (male): Secondary | ICD-10-CM

## 2016-07-11 DIAGNOSIS — Z85819 Personal history of malignant neoplasm of unspecified site of lip, oral cavity, and pharynx: Secondary | ICD-10-CM | POA: Diagnosis not present

## 2016-07-11 NOTE — Assessment & Plan Note (Signed)
Continue Ditropan Will monitor

## 2016-07-11 NOTE — Assessment & Plan Note (Signed)
Continue Diclofenac Will monitor

## 2016-07-11 NOTE — Progress Notes (Signed)
HPI  Pt presents to the clinic today to establish care and for management of the conditions listed below. She is transferring care from Dr. Everlene Farrier.  Arthritis: Mainly in her knees. She takes Diclofenac with good relief.  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 She did not have to have chemo or radiation. She follows with ENT, Dr. Wilburn Cornelia.  Seasonal Allergies: Worse in fall. She takes Flonase daily with good relief.  OAB: She takes Ditropan with fair relief.  Flu: 06/2016 Tetanus: 07/2012 Pneumovax: 2008 Prevnar: 07/2014 Zostovax: 2009 Pap smear: no longer screening Mammogram: 07/2015 Dexa: 2015, normal Colonoscopy: 07/2015 at [place] Screening: 11/2015, [place] Dentist: biannually  Past Medical History:  Diagnosis Date  . Arthritis   . History of chicken pox   . History of colon polyps   . History of mouth cancer     Current Outpatient Prescriptions  Medication Sig Dispense Refill  . aspirin 81 MG tablet Take 81 mg by mouth daily.    . Calcium Carb-Cholecalciferol (CALCIUM 600/VITAMIN D3) 600-800 MG-UNIT TABS Take 2 capsules by mouth daily.    . diclofenac (VOLTAREN) 75 MG EC tablet Take 1 tablet by mouth 2 (two) times daily.    . fluticasone (FLONASE) 50 MCG/ACT nasal spray Place 2 sprays into the nose daily.    . Glucosamine-Chondroit-Vit C-Mn (GLUCOSAMINE 1500 COMPLEX) CAPS Take 2 capsules by mouth daily.    . Multiple Vitamins-Minerals (MULTIVITAMIN WITH MINERALS) tablet Take 1 tablet by mouth daily.    Marland Kitchen oxybutynin (DITROPAN) 5 MG tablet Take 5 mg by mouth 2 (two) times daily.      No current facility-administered medications for this visit.     No Known Allergies  No family history on file.  Social History   Social History  . Marital status: Married    Spouse name: N/A  . Number of children: N/A  . Years of education: N/A   Occupational History  . Not on file.   Social History Main Topics  . Smoking status: Former  Smoker    Quit date: 12/28/1986  . Smokeless tobacco: Never Used  . Alcohol use Not on file  . Drug use: Unknown  . Sexual activity: Not on file   Other Topics Concern  . Not on file   Social History Narrative  . No narrative on file    ROS:  Constitutional: Denies fever, malaise, fatigue, headache or abrupt weight changes.  HEENT: Denies eye pain, eye redness, ear pain, ringing in the ears, wax buildup, runny nose, nasal congestion, bloody nose, or sore throat. Respiratory: Denies difficulty breathing, shortness of breath, cough or sputum production.   Cardiovascular: Denies chest pain, chest tightness, palpitations or swelling in the hands or feet.  Gastrointestinal: Denies abdominal pain, bloating, constipation, diarrhea or blood in the stool.  GU: Denies frequency, urgency, pain with urination, blood in urine, odor or discharge. Musculoskeletal: Pt reports bilateral knee pain. Denies decrease in range of motion, difficulty with gait, muscle pain or joint swelling.  Skin: Denies redness, rashes, lesions or ulcercations.  Neurological: Denies dizziness, difficulty with memory, difficulty with speech or problems with balance and coordination.  Psych: Denies anxiety, depression, SI/HI.  No other specific complaints in a complete review of systems (except as listed in HPI above).  PE:  BP 120/78   Pulse 74   Temp 97.8 F (36.6 C) (Oral)   Ht 5\' 2"  (1.575 m)   Wt 161 lb 8 oz (73.3 kg)  SpO2 97%   BMI 29.54 kg/m  Wt Readings from Last 3 Encounters:  07/11/16 161 lb 8 oz (73.3 kg)  06/24/16 160 lb (72.6 kg)  02/26/16 150 lb 3.2 oz (68.1 kg)    General: Appears her stated age, well developed, well nourished in NAD. HEENT: Throat/Mouth: Teeth present, mucosa pink and moist, no lesions or ulcerations noted.   Cardiovascular: Normal rate and rhythm. S1,S2 noted.  No murmur, rubs or gallops noted.  Pulmonary/Chest: Normal effort and positive vesicular breath sounds. No  respiratory distress. No wheezes, rales or ronchi noted.  Musculoskeletal:  No signs of joint swelling. No difficulty with gait.  Neurological: Alert and oriented.  Psychiatric: Mood and affect normal. Behavior is normal. Judgment and thought content normal.     BMET    Component Value Date/Time   NA 139 06/24/2016 0853   K 4.2 06/24/2016 0853   CL 102 06/24/2016 0853   CO2 26 06/24/2016 0853   GLUCOSE 98 06/24/2016 0853   BUN 17 06/24/2016 0853   CREATININE 0.59 (L) 06/24/2016 0853   CALCIUM 9.5 06/24/2016 0853   GFRNONAA >89 06/24/2016 0853   GFRAA >89 06/24/2016 0853    Lipid Panel     Component Value Date/Time   CHOL 178 06/24/2016 0853   TRIG 104 06/24/2016 0853   HDL 59 06/24/2016 0853   CHOLHDL 3.0 06/24/2016 0853   VLDL 21 06/24/2016 0853   LDLCALC 98 06/24/2016 0853    CBC    Component Value Date/Time   WBC 5.7 06/24/2016 0909   WBC 5.0 01/04/2011 0641   RBC 4.62 06/24/2016 0909   RBC 4.41 01/04/2011 0641   HGB 14.4 06/24/2016 0909   HGB 13.2 01/04/2011 0641   HCT 40.0 06/24/2016 0909   HCT 38.9 01/04/2011 0641   PLT 161 01/04/2011 0641   MCV 86.6 06/24/2016 0909   MCH 31.1 06/24/2016 0909   MCH 29.9 01/04/2011 0641   MCHC 35.9 (A) 06/24/2016 0909   MCHC 33.9 01/04/2011 0641   RDW 12.6 01/04/2011 0641    Hgb A1C No results found for: HGBA1C   Assessment and Plan:  RTC in 1 year for your annual exam Webb Silversmith, NP

## 2016-07-11 NOTE — Assessment & Plan Note (Signed)
Continue daily Flonase

## 2016-07-11 NOTE — Assessment & Plan Note (Signed)
No reoccurrence  Will monitor 

## 2016-07-11 NOTE — Patient Instructions (Signed)
Generic Knee Exercises EXERCISES RANGE OF MOTION (ROM) AND STRETCHING EXERCISES These exercises may help you when beginning to rehabilitate your injury. Your symptoms may resolve with or without further involvement from your physician, physical therapist, or athletic trainer. While completing these exercises, remember:   Restoring tissue flexibility helps normal motion to return to the joints. This allows healthier, less painful movement and activity.  An effective stretch should be held for at least 30 seconds.  A stretch should never be painful. You should only feel a gentle lengthening or release in the stretched tissue. STRETCH - Knee Extension, Prone  Lie on your stomach on a firm surface, such as a bed or countertop. Place your right / left knee and leg just beyond the edge of the surface. You may wish to place a towel under the far end of your right / left thigh for comfort.  Relax your leg muscles and allow gravity to straighten your knee. Your clinician may advise you to add an ankle weight if more resistance is helpful for you.  You should feel a stretch in the back of your right / left knee. Hold this position for __________ seconds. Repeat __________ times. Complete this stretch __________ times per day. * Your physician, physical therapist, or athletic trainer may ask you to add ankle weight to enhance your stretch.  RANGE OF MOTION - Knee Flexion, Active  Lie on your back with both knees straight. (If this causes back discomfort, bend your opposite knee, placing your foot flat on the floor.)  Slowly slide your heel back toward your buttocks until you feel a gentle stretch in the front of your knee or thigh.  Hold for __________ seconds. Slowly slide your heel back to the starting position. Repeat __________ times. Complete this exercise __________ times per day.  STRETCH - Quadriceps, Prone   Lie on your stomach on a firm surface, such as a bed or padded floor.  Bend your  right / left knee and grasp your ankle. If you are unable to reach your ankle or pant leg, use a belt around your foot to lengthen your reach.  Gently pull your heel toward your buttocks. Your knee should not slide out to the side. You should feel a stretch in the front of your thigh and/or knee.  Hold this position for __________ seconds. Repeat __________ times. Complete this stretch __________ times per day.  STRETCH - Hamstrings, Supine   Lie on your back. Loop a belt or towel over the ball of your right / left foot.  Straighten your right / left knee and slowly pull on the belt to raise your leg. Do not allow the right / left knee to bend. Keep your opposite leg flat on the floor.  Raise the leg until you feel a gentle stretch behind your right / left knee or thigh. Hold this position for __________ seconds. Repeat __________ times. Complete this stretch __________ times per day.  STRENGTHENING EXERCISES These exercises may help you when beginning to rehabilitate your injury. They may resolve your symptoms with or without further involvement from your physician, physical therapist, or athletic trainer. While completing these exercises, remember:   Muscles can gain both the endurance and the strength needed for everyday activities through controlled exercises.  Complete these exercises as instructed by your physician, physical therapist, or athletic trainer. Progress the resistance and repetitions only as guided.  You may experience muscle soreness or fatigue, but the pain or discomfort you are trying to   eliminate should never worsen during these exercises. If this pain does worsen, stop and make certain you are following the directions exactly. If the pain is still present after adjustments, discontinue the exercise until you can discuss the trouble with your clinician. STRENGTH - Quadriceps, Isometrics  Lie on your back with your right / left leg extended and your opposite knee  bent.  Gradually tense the muscles in the front of your right / left thigh. You should see either your knee cap slide up toward your hip or increased dimpling just above the knee. This motion will push the back of the knee down toward the floor/mat/bed on which you are lying.  Hold the muscle as tight as you can without increasing your pain for __________ seconds.  Relax the muscles slowly and completely in between each repetition. Repeat __________ times. Complete this exercise __________ times per day.  STRENGTH - Quadriceps, Short Arcs   Lie on your back. Place a __________ inch towel roll under your knee so that the knee slightly bends.  Raise only your lower leg by tightening the muscles in the front of your thigh. Do not allow your thigh to rise.  Hold this position for __________ seconds. Repeat __________ times. Complete this exercise __________ times per day.  OPTIONAL ANKLE WEIGHTS: Begin with ____________________, but DO NOT exceed ____________________. Increase in 1 pound/0.5 kilogram increments.  STRENGTH - Quadriceps, Straight Leg Raises  Quality counts! Watch for signs that the quadriceps muscle is working to insure you are strengthening the correct muscles and not "cheating" by substituting with healthier muscles.  Lay on your back with your right / left leg extended and your opposite knee bent.  Tense the muscles in the front of your right / left thigh. You should see either your knee cap slide up or increased dimpling just above the knee. Your thigh may even quiver.  Tighten these muscles even more and raise your leg 4 to 6 inches off the floor. Hold for __________ seconds.  Keeping these muscles tense, lower your leg.  Relax the muscles slowly and completely in between each repetition. Repeat __________ times. Complete this exercise __________ times per day.  STRENGTH - Hamstring, Curls  Lay on your stomach with your legs extended. (If you lay on a bed, your feet  may hang over the edge.)  Tighten the muscles in the back of your thigh to bend your right / left knee up to 90 degrees. Keep your hips flat on the bed/floor.  Hold this position for __________ seconds.  Slowly lower your leg back to the starting position. Repeat __________ times. Complete this exercise __________ times per day.  OPTIONAL ANKLE WEIGHTS: Begin with ____________________, but DO NOT exceed ____________________. Increase in 1 pound/0.5 kilogram increments.  STRENGTH - Quadriceps, Squats  Stand in a door frame so that your feet and knees are in line with the frame.  Use your hands for balance, not support, on the frame.  Slowly lower your weight, bending at the hips and knees. Keep your lower legs upright so that they are parallel with the door frame. Squat only within the range that does not increase your knee pain. Never let your hips drop below your knees.  Slowly return upright, pushing with your legs, not pulling with your hands. Repeat __________ times. Complete this exercise __________ times per day.  STRENGTH - Quadriceps, Wall Slides  Follow guidelines for form closely. Increased knee pain often results from poorly placed feet or knees.    Lean against a smooth wall or door and walk your feet out 18-24 inches. Place your feet hip-width apart.  Slowly slide down the wall or door until your knees bend __________ degrees.* Keep your knees over your heels, not your toes, and in line with your hips, not falling to either side.  Hold for __________ seconds. Stand up to rest for __________ seconds in between each repetition. Repeat __________ times. Complete this exercise __________ times per day. * Your physician, physical therapist, or athletic trainer will alter this angle based on your symptoms and progress.   This information is not intended to replace advice given to you by your health care provider. Make sure you discuss any questions you have with your health care  provider.   Document Released: 08/07/2005 Document Revised: 10/14/2014 Document Reviewed: 01/05/2009 Elsevier Interactive Patient Education 2016 [employer] Inc[employer]  

## 2016-07-19 ENCOUNTER — Ambulatory Visit
Admission: RE | Admit: 2016-07-19 | Discharge: 2016-07-19 | Disposition: A | Discharge status: Home / Self Care | Payer: PPO | Source: Ambulatory Visit | Attending: Obstetrics and Gynecology | Admitting: Obstetrics and Gynecology

## 2016-07-19 DIAGNOSIS — Z1231 Encounter for screening mammogram for malignant neoplasm of breast: Secondary | ICD-10-CM | POA: Diagnosis not present

## 2016-07-22 ENCOUNTER — Ambulatory Visit: Payer: PPO | Admitting: Internal Medicine

## 2016-08-12 DIAGNOSIS — Z6829 Body mass index (BMI) 29.0-29.9, adult: Secondary | ICD-10-CM | POA: Diagnosis not present

## 2016-08-12 DIAGNOSIS — Z01419 Encounter for gynecological examination (general) (routine) without abnormal findings: Secondary | ICD-10-CM | POA: Diagnosis not present

## 2016-08-17 DIAGNOSIS — K5900 Constipation, unspecified: Secondary | ICD-10-CM | POA: Diagnosis not present

## 2016-08-17 DIAGNOSIS — K120 Recurrent oral aphthae: Secondary | ICD-10-CM | POA: Diagnosis not present

## 2016-08-17 DIAGNOSIS — R682 Dry mouth, unspecified: Secondary | ICD-10-CM | POA: Diagnosis not present

## 2016-08-21 ENCOUNTER — Other Ambulatory Visit: Payer: Self-pay | Admitting: Obstetrics and Gynecology

## 2016-08-21 DIAGNOSIS — E2839 Other primary ovarian failure: Secondary | ICD-10-CM

## 2016-08-27 ENCOUNTER — Ambulatory Visit
Admission: RE | Admit: 2016-08-27 | Discharge: 2016-08-27 | Disposition: A | Discharge status: Home / Self Care | Payer: PPO | Source: Ambulatory Visit | Attending: Obstetrics and Gynecology | Admitting: Obstetrics and Gynecology

## 2016-08-27 DIAGNOSIS — Z780 Asymptomatic menopausal state: Secondary | ICD-10-CM | POA: Diagnosis not present

## 2016-08-27 DIAGNOSIS — E2839 Other primary ovarian failure: Secondary | ICD-10-CM

## 2016-08-27 DIAGNOSIS — Z13820 Encounter for screening for osteoporosis: Secondary | ICD-10-CM | POA: Diagnosis not present

## 2016-09-02 DIAGNOSIS — K1370 Unspecified lesions of oral mucosa: Secondary | ICD-10-CM | POA: Insufficient documentation

## 2016-09-02 DIAGNOSIS — Z85819 Personal history of malignant neoplasm of unspecified site of lip, oral cavity, and pharynx: Secondary | ICD-10-CM | POA: Diagnosis not present

## 2016-11-08 DIAGNOSIS — Z85819 Personal history of malignant neoplasm of unspecified site of lip, oral cavity, and pharynx: Secondary | ICD-10-CM | POA: Diagnosis not present

## 2016-11-08 DIAGNOSIS — K1370 Unspecified lesions of oral mucosa: Secondary | ICD-10-CM | POA: Diagnosis not present

## 2016-11-08 DIAGNOSIS — R040 Epistaxis: Secondary | ICD-10-CM | POA: Insufficient documentation

## 2016-12-25 ENCOUNTER — Other Ambulatory Visit (INDEPENDENT_AMBULATORY_CARE_PROVIDER_SITE_OTHER): Payer: Self-pay

## 2016-12-25 ENCOUNTER — Ambulatory Visit (INDEPENDENT_AMBULATORY_CARE_PROVIDER_SITE_OTHER): Payer: PPO | Admitting: Orthopaedic Surgery

## 2016-12-25 DIAGNOSIS — G5603 Carpal tunnel syndrome, bilateral upper limbs: Secondary | ICD-10-CM

## 2016-12-25 DIAGNOSIS — G8929 Other chronic pain: Secondary | ICD-10-CM

## 2016-12-25 DIAGNOSIS — M25561 Pain in right knee: Secondary | ICD-10-CM | POA: Diagnosis not present

## 2016-12-25 DIAGNOSIS — M79642 Pain in left hand: Principal | ICD-10-CM

## 2016-12-25 DIAGNOSIS — M79641 Pain in right hand: Secondary | ICD-10-CM

## 2016-12-25 MED ORDER — METHYLPREDNISOLONE ACETATE 40 MG/ML IJ SUSP
40.0000 mg | INTRAMUSCULAR | Status: AC | PRN
  Administered 2016-12-25: 40 mg via INTRA_ARTICULAR

## 2016-12-25 NOTE — Progress Notes (Signed)
Office Visit Note   Patient: [name]           Date of Birth: December 16, 1940           MRN: [id number] Visit Date: 12/25/2016              Requested by: Jearld Fenton, NP [place], [place] [address] PCP: Webb Silversmith, NP   Assessment & Plan: Visit Diagnoses:  1. Chronic pain of right knee   2. Bilateral carpal tunnel syndrome     Plan: Per her request we did place a steroid injection in her right knee and she tolerated this well. She would like bilateral nerve conduction studies to assess whether or not she is developing carpal tunnel syndrome and I do feel that this is helpful with determining the best treatment plan for her. We will see her back after the nerve conduction studies.  Follow-Up Instructions: Return in about 4 weeks (around 01/22/2017).   Orders:  Orders Placed This Encounter  Procedures  . Large Joint Injection/Arthrocentesis   No orders of the defined types were placed in this encounter.     Procedures: Large Joint Inj Date/Time: 12/25/2016 9:24 AM Performed by: Mcarthur Rossetti Authorized by: Mcarthur Rossetti   Location:  Knee Site:  R knee Ultrasound Guidance: No   Fluoroscopic Guidance: No   Arthrogram: No   Medications:  40 mg methylPREDNISolone acetate 40 MG/ML     Clinical Data: No additional findings.   Subjective: No chief complaint on file. The patient is well-known to me. She does have chronic right knee issues and has had an arthroscopic intervention. She does not have severe arthritis the knee but is been enough pain over the years to get a steroid injection from time to time. It's been a long time since she's had a injection in her knee. She like to have a steroid injection today. She also has another chief complaint of bilateral hand numbness and tingling with left worse than right. She points to the thumb index and middle fingers on both hands as source of the numbness and tingling. There is  occasional pain with this as well. Is recently started to wake up occasionally at night. It mainly occurs during the day. She does a lot of needlepoint and crocheting as well. She denies any diabetes history and denies any history of peripheral neuropathy in the past. She denies any neck pain. She denies any weakness.  HPI  Review of Systems She currently denies any active medical problems. She denies any headache, shortness of breath, chest pain, fever, chills, nausea, vomiting.  Objective: Vital Signs: There were no vitals taken for this visit.  Physical Exam She is alert and oriented 3 and in no acute distress Ortho Exam Examination of her right knee shows no significant effusion. She has a slight medial joint line tenderness and some global pain but good range of motion. Her knee is ligamentously stable. She has a negative McMurray exam. Examination of both upper extremities showed just slightly weak tension grip strength. She has subjective numbness in the median nerve distribution. She does not have a true Phalen's or Tinel's sign. Specialty Comments:  No specialty comments available.  Imaging: No results found.   PMFS History: Patient Active Problem List   Diagnosis Date Noted  . 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 Chronic pain of right knee 12/25/2016  . Degenerative arthritis of knee, bilateral 07/11/2016  . 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 Allergic rhinitis 12/22/2011   Past Medical History:  Diagnosis Date  . Arthritis   . History of chicken pox   . History of colon polyps   . History of mouth cancer     No family history on file.  Past Surgical History:  Procedure Laterality Date  . APPENDECTOMY    . KNEE ARTHROSCOPY W/ MENISCAL REPAIR Left    Social History   Occupational History  . Not on file.   Social History Main Topics  . Smoking status: Former Smoker    Quit date: 12/28/1986  .  Smokeless tobacco: Never Used  . Alcohol use No  . Drug use: No  . Sexual activity: Not Currently

## 2017-01-02 ENCOUNTER — Ambulatory Visit (INDEPENDENT_AMBULATORY_CARE_PROVIDER_SITE_OTHER): Payer: PPO | Admitting: Physical Medicine and Rehabilitation

## 2017-01-02 ENCOUNTER — Encounter (INDEPENDENT_AMBULATORY_CARE_PROVIDER_SITE_OTHER): Payer: Self-pay | Admitting: Physical Medicine and Rehabilitation

## 2017-01-02 DIAGNOSIS — R202 Paresthesia of skin: Secondary | ICD-10-CM | POA: Diagnosis not present

## 2017-01-02 NOTE — Progress Notes (Addendum)
[name] - 76 y.o. female MRN [id number]  Date of birth: 1941/07/02  Office Visit Note: Visit Date: 01/02/2017 PCP: Webb Silversmith, NP Referred by: Jearld Fenton, NP  Subjective: Chief Complaint  Patient presents with  . Right Hand - Numbness  . Left Hand - Numbness   HPI: [name] is a very pleasant 76 year old right-hand dominant female with chronic worsening pain numbness and tingling in both hands particularly left worse than right. She endorses more the numbness and tingling in the second third and fourth digits on the left hand. She reports that now the symptoms are constant in the used to come and go. She does use a lot of knitting and she's having difficulty at this point gripping the needle. She does report weakness. She has not had prior electrodiagnostic studies. She's had no recent cervical spine MRI. She doesn't really endorse pain down the arms.    ROS Otherwise per HPI.  Assessment & Plan: Visit Diagnoses:  1. Paresthesia of skin     Plan: No additional findings.  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 The bilateral amplitude loss of the median motor conduction measured at the APB could be technical artifact.   There is no significant electrodiagnostic evidence of any other focal nerve entrapment, brachial plexopathy, cervical radiculopathy or generalized peripheral neuropathy.   **As you know, this particular electrodiagnostic study cannot rule out chemical radiculitis or sensory only radiculopathy.  Recommendations: 1.  Follow-up with referring physician. 2.  Continue current management of symptoms. Diagnostic carpal tunnel injection on left may help diagnostically. May want to consider cervical MRI if felt to be radicular.     Meds & Orders: No orders of the defined types were placed in this encounter.   Orders Placed This  Encounter  Procedures  . NCV with EMG (electromyography)    Follow-up: Return for [name] follow-up.   Procedures: No procedures performed  EMG & NCV Findings: Evaluation of the left median motor and the right median motor nerves showed reduced amplitude (L2.6, R3.7 mV).  The right median (across palm) sensory nerve showed prolonged distal peak latency (Wrist, 3.8 ms) and prolonged distal peak latency (Palm, 2.1 ms).  All remaining nerves (as indicated in the following tables) were within normal limits.  Left vs. Right side comparison data for the ulnar motor nerve indicates abnormal L-R velocity difference (B Elbow-Wrist, 11 m/s).  All remaining left vs. right side differences were within normal limits.    All examined muscles (as indicated in the following table) showed no evidence of electrical instability.    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 The bilateral amplitude loss of the median motor conduction measured at the APB could be technical artifact.   There is no significant electrodiagnostic evidence of any other focal nerve entrapment, brachial plexopathy, cervical radiculopathy or generalized peripheral neuropathy.   **As you know, this particular electrodiagnostic study cannot rule out chemical radiculitis or sensory only radiculopathy.  Recommendations: 1.  Follow-up with referring physician. 2.  Continue current management of symptoms. Diagnostic carpal tunnel injection on left may help diagnostically. May want to consider cervical MRI if felt to be radicular.     Nerve Conduction Studies Anti Sensory Summary Table   Stim Site NR Peak (ms) Norm Peak (ms) P-T Amp (V)  Norm P-T Amp Site1 Site2 Delta-P (ms) Dist (cm) Vel (m/s) Norm Vel (m/s)  Left Median Acr Palm Anti Sensory (2nd Digit)  31.9C  Wrist    3.2 <3.6 39.1 >10 Wrist Palm 1.4 0.0      Palm    1.8 <2.0 47.4         Right Median Acr Palm Anti Sensory (2nd Digit)  32C  Wrist    *3.8 <3.6 29.3 >10 Wrist Palm 1.7 0.0    Palm    *2.1 <2.0 34.4         Left Radial Anti Sensory (Base 1st Digit)  31.7C  Wrist    2.2 <3.1 41.7  Wrist Base 1st Digit 2.2 0.0    Right Radial Anti Sensory (Base 1st Digit)  31.9C  Wrist    1.9 <3.1 37.9  Wrist Base 1st Digit 1.9 0.0    Left Ulnar Anti Sensory (5th Digit)  31.8C  Wrist    3.3 <3.7 22.9 >15.0 Wrist 5th Digit 3.3 14.0 42 >38  Right Ulnar Anti Sensory (5th Digit)  31.9C  Wrist    3.5 <3.7 18.2 >15.0 Wrist 5th Digit 3.5 14.0 40 >38   Motor Summary Table   Stim Site NR Onset (ms) Norm Onset (ms) O-P Amp (mV) Norm O-P Amp Site1 Site2 Delta-0 (ms) Dist (cm) Vel (m/s) Norm Vel (m/s)  Left Median Motor (Abd Poll Brev)  31.8C  Wrist    3.5 <4.2 *2.6 >5 Elbow Wrist 3.7 19.5 53 >50  Elbow    7.2  2.8         Right Median Motor (Abd Poll Brev)  31.9C  Wrist    3.7 <4.2 *3.7 >5 Elbow Wrist 3.6 19.0 53 >50  Elbow    7.3  3.7         Left Ulnar Motor (Abd Dig Min)  32.1C  Wrist    2.9 <4.2 11.0 >3 B Elbow Wrist 2.6 18.0 69 >53  B Elbow    5.5  10.5  A Elbow B Elbow 1.2 10.0 83 >53  A Elbow    6.7  9.4         Right Ulnar Motor (Abd Dig Min)  31.7C  Wrist    2.4 <4.2 11.6 >3 B Elbow Wrist 3.3 19.0 58 >53  B Elbow    5.7  8.7  A Elbow B Elbow 0.9 9.0 100 >53  A Elbow    6.6  8.5          EMG   Side Muscle Nerve Root Ins Act Fibs Psw Amp Dur Poly Recrt Int Fraser Din Comment  Left Abd Poll Brev Median C8-T1 Nml Nml Nml Nml Nml 0 Nml Nml   Left 1stDorInt Ulnar C8-T1 Nml Nml Nml Nml Nml 0 Nml Nml   Left PronatorTeres Median C6-7 Nml Nml Nml Nml Nml 0 Nml Nml   Left Biceps Musculocut C5-6 Nml Nml Nml Nml Nml 0 Nml Nml   Left Deltoid Axillary C5-6 Nml Nml Nml Nml Nml 0 Nml Nml     Nerve Conduction Studies Anti Sensory Left/Right Comparison   Stim Site L Lat (ms) R Lat (ms) L-R Lat (ms) L Amp (V) R Amp (V) L-R Amp (%) Site1 Site2 L Vel  (m/s) R Vel (m/s) L-R Vel (m/s)  Median Acr Palm Anti Sensory (2nd Digit)  31.9C  Wrist 3.2 *3.8 0.6 39.1 29.3 25.1 Wrist Palm     Palm 1.8 *2.1 0.3 47.4 34.4 27.4  Radial Anti Sensory (Base 1st Digit)  31.7C  Wrist 2.2 1.9 0.3 41.7 37.9 9.1 Wrist Base 1st Digit     Ulnar Anti Sensory (5th Digit)  31.8C  Wrist 3.3 3.5 0.2 22.9 18.2 20.5 Wrist 5th Digit 42 40 2   Motor Left/Right Comparison   Stim Site L Lat (ms) R Lat (ms) L-R Lat (ms) L Amp (mV) R Amp (mV) L-R Amp (%) Site1 Site2 L Vel (m/s) R Vel (m/s) L-R Vel (m/s)  Median Motor (Abd Poll Brev)  31.8C  Wrist 3.5 3.7 0.2 *2.6 *3.7 29.7 Elbow Wrist 53 53 0  Elbow 7.2 7.3 0.1 2.8 3.7 24.3       Ulnar Motor (Abd Dig Min)  32.1C  Wrist 2.9 2.4 0.5 11.0 11.6 5.2 B Elbow Wrist 69 58 *11  B Elbow 5.5 5.7 0.2 10.5 8.7 17.1 A Elbow B Elbow 83 100 17  A Elbow 6.7 6.6 0.1 9.4 8.5 9.6             Clinical History: No specialty comments available.  She reports that she quit smoking about 30 years ago. She has never used smokeless tobacco. No results for input(s): HGBA1C, LABURIC in the last 8760 hours.  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 There is no swelling, color changes, allodynia or dystrophic changes. There is 5 out of 5 strength in the bilateral wrist extension, finger abduction and long finger flexion. There is a negative Tinel's test at the bilateral wrist and elbow. There is a negative Hoffmann's test bilaterally.    Ortho Exam Imaging: No results found.  Past Medical/Family/Surgical/Social History: Medications & Allergies reviewed per EMR Patient Active Problem List   Diagnosis Date Noted  . 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 Chronic pain of right knee 12/25/2016  . Degenerative  arthritis of knee, bilateral 07/11/2016  . 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 Allergic rhinitis 12/22/2011   Past Medical History:  Diagnosis Date  . Arthritis   . History of chicken pox   . History of colon polyps   . History of mouth cancer    History reviewed. No pertinent family history. Past Surgical History:  Procedure Laterality Date  . APPENDECTOMY    . KNEE ARTHROSCOPY W/ MENISCAL REPAIR Left    Social History   Occupational History  . Not on file.   Social History Main Topics  . Smoking status: Former Smoker    Quit date: 12/28/1986  . Smokeless tobacco: Never Used  . Alcohol use No  . Drug use: No  . Sexual activity: Not Currently

## 2017-01-07 ENCOUNTER — Encounter (INDEPENDENT_AMBULATORY_CARE_PROVIDER_SITE_OTHER): Payer: Self-pay | Admitting: Physical Medicine and Rehabilitation

## 2017-01-07 NOTE — Procedures (Signed)
EMG & NCV Findings: Evaluation of the left median motor and the right median motor nerves showed reduced amplitude (L2.6, R3.7 mV).  The right median (across palm) sensory nerve showed prolonged distal peak latency (Wrist, 3.8 ms) and prolonged distal peak latency (Palm, 2.1 ms).  All remaining nerves (as indicated in the following tables) were within normal limits.  Left vs. Right side comparison data for the ulnar motor nerve indicates abnormal L-R velocity difference (B Elbow-Wrist, 11 m/s).  All remaining left vs. right side differences were within normal limits.    All examined muscles (as indicated in the following table) showed no evidence of electrical instability.    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 The bilateral amplitude loss of the median motor conduction measured at the APB could be technical artifact.   There is no significant electrodiagnostic evidence of any other focal nerve entrapment, brachial plexopathy, cervical radiculopathy or generalized peripheral neuropathy.   **As you know, this particular electrodiagnostic study cannot rule out chemical radiculitis or sensory only radiculopathy.  Recommendations: 1.  Follow-up with referring physician. 2.  Continue current management of symptoms. Diagnostic carpal tunnel injection on left may help diagnostically. May want to consider cervical MRI if felt to be radicular.     Nerve Conduction Studies Anti Sensory Summary Table   Stim Site NR Peak (ms) Norm Peak (ms) P-T Amp (V) Norm P-T Amp Site1 Site2 Delta-P (ms) Dist (cm) Vel (m/s) Norm Vel (m/s)  Left Median Acr Palm Anti Sensory (2nd Digit)  31.9C  Wrist    3.2 <3.6 39.1 >10 Wrist Palm 1.4 0.0    Palm    1.8 <2.0 47.4         Right Median Acr Palm Anti Sensory (2nd Digit)  32C  Wrist    *3.8 <3.6 29.3 >10 Wrist Palm 1.7 0.0    Palm    *2.1 <2.0  34.4         Left Radial Anti Sensory (Base 1st Digit)  31.7C  Wrist    2.2 <3.1 41.7  Wrist Base 1st Digit 2.2 0.0    Right Radial Anti Sensory (Base 1st Digit)  31.9C  Wrist    1.9 <3.1 37.9  Wrist Base 1st Digit 1.9 0.0    Left Ulnar Anti Sensory (5th Digit)  31.8C  Wrist    3.3 <3.7 22.9 >15.0 Wrist 5th Digit 3.3 14.0 42 >38  Right Ulnar Anti Sensory (5th Digit)  31.9C  Wrist    3.5 <3.7 18.2 >15.0 Wrist 5th Digit 3.5 14.0 40 >38   Motor Summary Table   Stim Site NR Onset (ms) Norm Onset (ms) O-P Amp (mV) Norm O-P Amp Site1 Site2 Delta-0 (ms) Dist (cm) Vel (m/s) Norm Vel (m/s)  Left Median Motor (Abd Poll Brev)  31.8C  Wrist    3.5 <4.2 *2.6 >5 Elbow Wrist 3.7 19.5 53 >50  Elbow    7.2  2.8         Right Median Motor (Abd Poll Brev)  31.9C  Wrist    3.7 <4.2 *3.7 >5 Elbow Wrist 3.6 19.0 53 >50  Elbow    7.3  3.7         Left Ulnar Motor (Abd Dig Min)  32.1C  Wrist    2.9 <4.2 11.0 >3 B Elbow Wrist 2.6 18.0 69 >53  B Elbow    5.5  10.5  A  Elbow B Elbow 1.2 10.0 83 >53  A Elbow    6.7  9.4         Right Ulnar Motor (Abd Dig Min)  31.7C  Wrist    2.4 <4.2 11.6 >3 B Elbow Wrist 3.3 19.0 58 >53  B Elbow    5.7  8.7  A Elbow B Elbow 0.9 9.0 100 >53  A Elbow    6.6  8.5          EMG   Side Muscle Nerve Root Ins Act Fibs Psw Amp Dur Poly Recrt Int Fraser Din Comment  Left Abd Poll Brev Median C8-T1 Nml Nml Nml Nml Nml 0 Nml Nml   Left 1stDorInt Ulnar C8-T1 Nml Nml Nml Nml Nml 0 Nml Nml   Left PronatorTeres Median C6-7 Nml Nml Nml Nml Nml 0 Nml Nml   Left Biceps Musculocut C5-6 Nml Nml Nml Nml Nml 0 Nml Nml   Left Deltoid Axillary C5-6 Nml Nml Nml Nml Nml 0 Nml Nml     Nerve Conduction Studies Anti Sensory Left/Right Comparison   Stim Site L Lat (ms) R Lat (ms) L-R Lat (ms) L Amp (V) R Amp (V) L-R Amp (%) Site1 Site2 L Vel (m/s) R Vel (m/s) L-R Vel (m/s)  Median Acr Palm Anti Sensory (2nd Digit)  31.9C  Wrist 3.2 *3.8 0.6 39.1 29.3 25.1 Wrist Palm     Palm 1.8 *2.1 0.3 47.4  34.4 27.4       Radial Anti Sensory (Base 1st Digit)  31.7C  Wrist 2.2 1.9 0.3 41.7 37.9 9.1 Wrist Base 1st Digit     Ulnar Anti Sensory (5th Digit)  31.8C  Wrist 3.3 3.5 0.2 22.9 18.2 20.5 Wrist 5th Digit 42 40 2   Motor Left/Right Comparison   Stim Site L Lat (ms) R Lat (ms) L-R Lat (ms) L Amp (mV) R Amp (mV) L-R Amp (%) Site1 Site2 L Vel (m/s) R Vel (m/s) L-R Vel (m/s)  Median Motor (Abd Poll Brev)  31.8C  Wrist 3.5 3.7 0.2 *2.6 *3.7 29.7 Elbow Wrist 53 53 0  Elbow 7.2 7.3 0.1 2.8 3.7 24.3       Ulnar Motor (Abd Dig Min)  32.1C  Wrist 2.9 2.4 0.5 11.0 11.6 5.2 B Elbow Wrist 69 58 *11  B Elbow 5.5 5.7 0.2 10.5 8.7 17.1 A Elbow B Elbow 83 100 17  A Elbow 6.7 6.6 0.1 9.4 8.5 9.6

## 2017-01-13 DIAGNOSIS — H2513 Age-related nuclear cataract, bilateral: Secondary | ICD-10-CM | POA: Diagnosis not present

## 2017-01-28 ENCOUNTER — Ambulatory Visit (INDEPENDENT_AMBULATORY_CARE_PROVIDER_SITE_OTHER): Payer: PPO | Admitting: Orthopaedic Surgery

## 2017-01-28 DIAGNOSIS — G5601 Carpal tunnel syndrome, right upper limb: Secondary | ICD-10-CM | POA: Diagnosis not present

## 2017-01-28 DIAGNOSIS — G8929 Other chronic pain: Secondary | ICD-10-CM

## 2017-01-28 DIAGNOSIS — M25561 Pain in right knee: Secondary | ICD-10-CM | POA: Diagnosis not present

## 2017-01-28 NOTE — Progress Notes (Signed)
The patient is here for follow-up regarding her chronic right knee pain as well as her bilateral upper extremity nerve conduction studies to rule out carpal tunnel syndrome. Her numbness is not daily and it's worse on the right side but sometimes he can dorsal left side. The injection I provided in her right knee helped minimally. We have performed. Surgery on that right knee before and found the cartilage to have minimal degenerative changes and she has significant meniscal tear but is to continue to bother her and the family.  On examination right knee is no effusion her pain is on the medial joint line. There is some slight dorsal from crepitation. Surgery hands go she's got good grip strength and some slight numbness in the median her to nerve distribution on the right but not left. There is no muscle atrophy and she had excellent pinch strength as well.  The nerve conduction studies do not show any evidence of carpal tunnel syndrome on the left side but it did show mild carpal tunnel the right side.  Further treatment for carpal tunnel right now it is recommended that her carpal tunnel wrist splint for the right side and she agrees with this as well. She does not want any other intervention of her knee for but I recommended likely a partial knee replacement. When we see her back in 3 months for repeat exam like a standing AP and lateral of her right knee.

## 2017-03-07 ENCOUNTER — Ambulatory Visit (INDEPENDENT_AMBULATORY_CARE_PROVIDER_SITE_OTHER): Payer: PPO | Admitting: Internal Medicine

## 2017-03-07 ENCOUNTER — Encounter: Payer: Self-pay | Admitting: Internal Medicine

## 2017-03-07 VITALS — BP 126/70 | HR 75 | Temp 98.1°F | Resp 18 | Wt 157.0 lb

## 2017-03-07 DIAGNOSIS — J988 Other specified respiratory disorders: Secondary | ICD-10-CM

## 2017-03-07 DIAGNOSIS — B9789 Other viral agents as the cause of diseases classified elsewhere: Secondary | ICD-10-CM | POA: Diagnosis not present

## 2017-03-07 MED ORDER — HYDROCODONE-HOMATROPINE 5-1.5 MG/5ML PO SYRP: 5.0000 mL | ORAL_SOLUTION | Freq: Every evening | ORAL | 0 refills | Status: DC | PRN

## 2017-03-07 NOTE — Progress Notes (Signed)
   Subjective:    Patient ID: [name], female    DOB: 11-28-1940, 76 y.o.   MRN: [id number]  HPI Here due to cough  Started bad about 4 days ago No fever Cough is dry No SOB or wheezing No congestion or rhinorrhea No ear pain Slight irritated throat from coughing  Robitussin helps minimally Some trouble sleeping  Current Outpatient Prescriptions on File Prior to Visit  Medication Sig Dispense Refill  . aspirin 81 MG tablet Take 81 mg by mouth daily.    . Calcium Carb-Cholecalciferol (CALCIUM 600/VITAMIN D3) 600-800 MG-UNIT TABS Take 2 capsules by mouth daily.    . diclofenac (VOLTAREN) 75 MG EC tablet Take 1 tablet by mouth 2 (two) times daily.    . fluticasone (FLONASE) 50 MCG/ACT nasal spray Place 2 sprays into the nose daily.    . Glucosamine-Chondroit-Vit C-Mn (GLUCOSAMINE 1500 COMPLEX) CAPS Take 2 capsules by mouth daily.    . Multiple Vitamins-Minerals (MULTIVITAMIN WITH MINERALS) tablet Take 1 tablet by mouth daily.    Marland Kitchen oxybutynin (DITROPAN) 5 MG tablet Take 5 mg by mouth 2 (two) times daily.      No current facility-administered medications on file prior to visit.     No Known Allergies  Past Medical History:  Diagnosis Date  . Arthritis   . History of chicken pox   . History of colon polyps   . History of mouth cancer     Past Surgical History:  Procedure Laterality Date  . APPENDECTOMY    . KNEE ARTHROSCOPY W/ MENISCAL REPAIR Left     No family history on file.  Social History   Social History  . Marital status: Married    Spouse name: N/A  . Number of children: N/A  . Years of education: N/A   Occupational History  . Not on file.   Social History Main Topics  . Smoking status: Former Smoker    Quit date: 12/28/1986  . Smokeless tobacco: Never Used  . Alcohol use No  . Drug use: No  . Sexual activity: Not Currently   Other Topics Concern  . Not on file   Social History Narrative  . No narrative on file   Review of Systems No  heartburn or dysphagia No rash Appetite is okay No N/V    Objective:   Physical Exam  Constitutional: She appears well-nourished. No distress.  HENT:  Mouth/Throat: Oropharynx is clear and moist. No oropharyngeal exudate.  No sinus tenderness TMs normal Slight nasal inflammation  Neck: Neck supple. No thyromegaly present.  Pulmonary/Chest: Effort normal and breath sounds normal. No respiratory distress. She has no wheezes. She has no rales.  Lymphadenopathy:    She has no cervical adenopathy.  Skin: No rash noted.          Assessment & Plan:

## 2017-03-07 NOTE — Assessment & Plan Note (Signed)
Mostly bronchitis Discussed viral etiology Discussed supportive care---nighttime cough syrup Rx If worsening next week, would try empiric antibiotic

## 2017-04-29 ENCOUNTER — Encounter (INDEPENDENT_AMBULATORY_CARE_PROVIDER_SITE_OTHER): Payer: Self-pay | Admitting: Orthopaedic Surgery

## 2017-04-29 ENCOUNTER — Ambulatory Visit (INDEPENDENT_AMBULATORY_CARE_PROVIDER_SITE_OTHER): Payer: PPO | Admitting: Orthopaedic Surgery

## 2017-04-29 ENCOUNTER — Ambulatory Visit (INDEPENDENT_AMBULATORY_CARE_PROVIDER_SITE_OTHER): Payer: PPO

## 2017-04-29 VITALS — Ht 62.0 in | Wt 157.0 lb

## 2017-04-29 DIAGNOSIS — G8929 Other chronic pain: Secondary | ICD-10-CM

## 2017-04-29 DIAGNOSIS — M25561 Pain in right knee: Secondary | ICD-10-CM

## 2017-04-29 MED ORDER — DICLOFENAC SODIUM 75 MG PO TBEC: 75.0000 mg | DELAYED_RELEASE_TABLET | Freq: Two times a day (BID) | ORAL | 3 refills | Status: DC | PRN

## 2017-04-29 MED ORDER — METHYLPREDNISOLONE ACETATE 40 MG/ML IJ SUSP
40.0000 mg | INTRAMUSCULAR | Status: AC | PRN
  Administered 2017-04-29: 40 mg via INTRA_ARTICULAR

## 2017-04-29 MED ORDER — LIDOCAINE HCL 1 % IJ SOLN
3.0000 mL | INTRAMUSCULAR | Status: AC | PRN
  Administered 2017-04-29: 3 mL

## 2017-04-29 NOTE — Progress Notes (Signed)
Office Visit Note   Patient: [name]           Date of Birth: 1940/12/23           MRN: [id number] Visit Date: 04/29/2017              Requested by: Jearld Fenton, NP [address] [place], [place] [address] PCP: Jearld Fenton, NP   Assessment & Plan: Visit Diagnoses:  1. Chronic pain of right knee     Plan: I spoke with her about at least trying one more steroid injection in her right knee. We'll also resume her diclofenac as an at anti-inflammatory. She is agreeable to trying this but is not sure what other surgical intervention could help other than potentially a partial knee replacement. We'll see her back in a month and see how she's tolerated the injection she doing overall.  Follow-Up Instructions: Return in about 4 weeks (around 05/27/2017).   Orders:  Orders Placed This Encounter  Procedures  . Large Joint Injection/Arthrocentesis  . XR Knee 1-2 Views Right   Meds ordered this encounter  Medications  . diclofenac (VOLTAREN) 75 MG EC tablet    Sig: Take 1 tablet (75 mg total) by mouth 2 (two) times daily between meals as needed.    Dispense:  60 tablet    Refill:  3      Procedures: Large Joint Inj Date/Time: 04/29/2017 8:38 AM Performed by: Mcarthur Rossetti Authorized by: Mcarthur Rossetti   Location:  Knee Site:  R knee Ultrasound Guidance: No   Fluoroscopic Guidance: No   Arthrogram: No   Medications:  3 mL lidocaine 1 %; 40 mg methylPREDNISolone acetate 40 MG/ML     Clinical Data: No additional findings.   Subjective: Chief Complaint  Patient presents with  . Right Knee - Follow-up  The patient comes in today with an acute flareup of right knee pain on top of chronic knee pain. We performed an arthroscopic intervention of this right knee in February 2017 and found a horizontal needle meniscal tear with minimal arthritic changes in the knee. She 76 years old says she still has pain weightbearing on occasion on the  medial aspect of her knee. There is no locking catching but it is painful. She is not had injection a long period time she says the last injection did not help.  HPI  Review of Systems She currently denies any headache, chest pain, shortness of breath, fever, chills, nausea, vomiting  Objective: Vital Signs: Ht 5\' 2"  (1.575 m)   Wt 157 lb (71.2 kg)   BMI 28.72 kg/m   Physical Exam Examination shows that she is alert right 3 in no acute distress Ortho Exam Examination of her right knee shows no significant varus malalignment. Her knee is ligamentous is stable full range of motion no effusion. She does have medial joint line tenderness. There is no lateral tenderness and no patellofemoral crepitation. Specialty Comments:  No specialty comments available.  Imaging: Xr Knee 1-2 Views Right  Result Date: 04/29/2017 2 views of the right knee show no acute findings. The alignment is neutral and there is no significant arthritic changes with well-maintained joint space.    PMFS History: Patient Active Problem List   Diagnosis Date Noted  . Viral respiratory infection 03/07/2017  . Carpal tunnel syndrome, right upper limb 01/28/2017  . 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 Chronic pain of right knee 12/25/2016  . Degenerative arthritis of  knee, bilateral 07/11/2016  . 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 Allergic rhinitis 12/22/2011   Past Medical History:  Diagnosis Date  . Arthritis   . History of chicken pox   . History of colon polyps   . History of mouth cancer     No family history on file.  Past Surgical History:  Procedure Laterality Date  . APPENDECTOMY    . KNEE ARTHROSCOPY W/ MENISCAL REPAIR Left    Social History   Occupational History  . Not on file.   Social History Main Topics  . Smoking status: Former Smoker    Quit date: 12/28/1986  . Smokeless tobacco: Never Used  . Alcohol use No  .  Drug use: No  . Sexual activity: Not Currently

## 2017-05-01 ENCOUNTER — Telehealth (INDEPENDENT_AMBULATORY_CARE_PROVIDER_SITE_OTHER): Payer: Self-pay | Admitting: Orthopaedic Surgery

## 2017-05-01 NOTE — Telephone Encounter (Signed)
Patient called advised if she can take tumeric along with all the other vitamins she taking. The number to contact patient is [phone]

## 2017-05-01 NOTE — Telephone Encounter (Signed)
That we'll be fine for her to take the Tumeric along with her other vitamins. No problem at all

## 2017-05-01 NOTE — Telephone Encounter (Signed)
Patient aware of the below message  

## 2017-05-26 DIAGNOSIS — Z85819 Personal history of malignant neoplasm of unspecified site of lip, oral cavity, and pharynx: Secondary | ICD-10-CM | POA: Diagnosis not present

## 2017-06-02 ENCOUNTER — Encounter (INDEPENDENT_AMBULATORY_CARE_PROVIDER_SITE_OTHER): Payer: Self-pay | Admitting: Orthopaedic Surgery

## 2017-06-02 ENCOUNTER — Ambulatory Visit (INDEPENDENT_AMBULATORY_CARE_PROVIDER_SITE_OTHER): Payer: PPO | Admitting: Orthopaedic Surgery

## 2017-06-02 DIAGNOSIS — M1711 Unilateral primary osteoarthritis, right knee: Secondary | ICD-10-CM | POA: Diagnosis not present

## 2017-06-02 DIAGNOSIS — M25561 Pain in right knee: Secondary | ICD-10-CM

## 2017-06-02 DIAGNOSIS — G8929 Other chronic pain: Secondary | ICD-10-CM | POA: Diagnosis not present

## 2017-06-02 NOTE — Progress Notes (Signed)
Office Visit Note   Patient: [name]           Date of Birth: 07/12/1941           MRN: [id number] Visit Date: 06/02/2017              Requested by: Jearld Fenton, NP [address] [place], [place] [address] PCP: Jearld Fenton, NP   Assessment & Plan: Visit Diagnoses:  1. Chronic pain of right knee   2. Unilateral primary osteoarthritis, right knee     Plan: We did talk again about a partial knee replacement however she is hesitant to do so since she is managing the pain and is not a significant experience daily. I agree with holding off until all conservative treatment options fail. She has a vacation the last week September where she'll be on a long bus trip and doing a lot of walking so she would like to have a steroid injection before then. She is not a diabetic and I agree with see her in 3 weeks to place a steroid injection in her right knee joint. All questions were encouraged and answered.  Follow-Up Instructions: Return in about 3 weeks (around 06/23/2017).   Orders:  No orders of the defined types were placed in this encounter.  No orders of the defined types were placed in this encounter.     Procedures: No procedures performed   Clinical Data: No additional findings.   Subjective: Chief Complaint  Patient presents with  . Right Knee - Follow-up  The patient is well-known to me. She has moderate arthritis and pain of her right knee just in the medial compartment. We last placed injection in the knee sometime month or two ago said only helped for about 2 weeks but it didn't hel long-term. She says her pain is not every day. It is mainly activity-related pain. She denies any locking catching.  HPI  Review of Systems She denies any headache, chest pain, shortness of breath, fever, chills, nausea, vomiting.  Objective: Vital Signs: There were no vitals taken for this visit.  Physical Exam She is alert and oriented 3 and in no acute  distress Ortho Exam Examination of her right knee shows only medial joint line tenderness with mild varus malalignment is easily correctable. Her knee feels ligaments stable. She has full range of motion of the knee. There is a minimal effusion. Specialty Comments:  No specialty comments available.  Imaging: No results found.   PMFS History: Patient Active Problem List   Diagnosis Date Noted  . Viral respiratory infection 03/07/2017  . Carpal tunnel syndrome, right upper limb 01/28/2017  . 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 Chronic pain of right knee 12/25/2016  . Degenerative arthritis of knee, bilateral 07/11/2016  . 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 Allergic rhinitis 12/22/2011   Past Medical History:  Diagnosis Date  . Arthritis   . History of chicken pox   . History of colon polyps   . History of mouth cancer     No family history on file.  Past Surgical History:  Procedure Laterality Date  . APPENDECTOMY    . KNEE ARTHROSCOPY W/ MENISCAL REPAIR Left    Social History   Occupational History  . Not on file.   Social History Main Topics  . Smoking status: Former Smoker    Quit date: 12/28/1986  . Smokeless tobacco: Never Used  .  Alcohol use No  . Drug use: No  . Sexual activity: Not Currently

## 2017-06-17 ENCOUNTER — Other Ambulatory Visit: Payer: Self-pay | Admitting: Obstetrics and Gynecology

## 2017-06-17 DIAGNOSIS — Z1231 Encounter for screening mammogram for malignant neoplasm of breast: Secondary | ICD-10-CM

## 2017-06-24 ENCOUNTER — Ambulatory Visit (INDEPENDENT_AMBULATORY_CARE_PROVIDER_SITE_OTHER): Payer: PPO | Admitting: Orthopaedic Surgery

## 2017-06-24 DIAGNOSIS — M1711 Unilateral primary osteoarthritis, right knee: Secondary | ICD-10-CM | POA: Diagnosis not present

## 2017-06-24 DIAGNOSIS — M25561 Pain in right knee: Secondary | ICD-10-CM

## 2017-06-24 DIAGNOSIS — G8929 Other chronic pain: Secondary | ICD-10-CM

## 2017-06-24 MED ORDER — METHYLPREDNISOLONE ACETATE 40 MG/ML IJ SUSP
40.0000 mg | INTRAMUSCULAR | Status: AC | PRN
  Administered 2017-06-24: 40 mg via INTRA_ARTICULAR

## 2017-06-24 MED ORDER — LIDOCAINE HCL 1 % IJ SOLN
3.0000 mL | INTRAMUSCULAR | Status: AC | PRN
  Administered 2017-06-24: 3 mL

## 2017-06-24 NOTE — Progress Notes (Signed)
   Procedure Note  Patient: [name]             Date of Birth: 05/31/1941           MRN: [id number]             Visit Date: 06/24/2017  Procedures: Visit Diagnoses: Chronic pain of right knee  Unilateral primary osteoarthritis, right knee  Large Joint Inj Date/Time: 06/24/2017 10:12 AM Performed by: Mcarthur Rossetti Authorized by: Mcarthur Rossetti   Location:  Knee Site:  R knee Ultrasound Guidance: No   Fluoroscopic Guidance: No   Arthrogram: No   Medications:  3 mL lidocaine 1 %; 40 mg methylPREDNISolone acetate 40 MG/ML   The patient comes in today for steroid injection in her right knee. She wants to consider partial knee replacement surgery potentially in the wintertime but she like to have still a steroid injection just to temporize things. Nothing else is change in her medical status. She still has medial joint line tenderness of the knee and known medial compartment arthritis. She understands fully the risks and benefits of injections and tolerated the injection well without any difficulty at all. Examination of her knee shows no effusion with excellent range of motion. The knee is ligamentously stable. She has medial joint line tenderness. She has a mild varus mild alignment which is easily correctable. She is overall alert and oriented and in no acute distress denies any fever chills. She tolerated the injection well. She will call if he gets closer the point where she wants to consider another injection or consider knee replacement surgery which would be a partial knee replacement.

## 2017-07-14 ENCOUNTER — Encounter: Payer: Self-pay | Admitting: Internal Medicine

## 2017-07-14 ENCOUNTER — Ambulatory Visit (INDEPENDENT_AMBULATORY_CARE_PROVIDER_SITE_OTHER): Payer: PPO | Admitting: Internal Medicine

## 2017-07-14 VITALS — BP 128/70 | HR 74 | Temp 97.8°F | Ht 62.0 in | Wt 158.8 lb

## 2017-07-14 DIAGNOSIS — Z23 Encounter for immunization: Secondary | ICD-10-CM | POA: Diagnosis not present

## 2017-07-14 DIAGNOSIS — Z0000 Encounter for general adult medical examination without abnormal findings: Secondary | ICD-10-CM | POA: Diagnosis not present

## 2017-07-14 NOTE — Addendum Note (Signed)
Addended by: Emelia Salisbury C on: 07/14/2017 03:42 PM   Modules accepted: Orders

## 2017-07-14 NOTE — Patient Instructions (Signed)
Health Maintenance for Postmenopausal Women Menopause is a normal process in which your reproductive ability comes to an end. This process happens gradually over a span of months to years, usually between the ages of 22 and 9. Menopause is complete when you have missed 12 consecutive menstrual periods. It is important to talk with your health care provider about some of the most common conditions that affect postmenopausal women, such as heart disease, cancer, and bone loss (osteoporosis). Adopting a healthy lifestyle and getting preventive care can help to promote your health and wellness. Those actions can also lower your chances of developing some of these common conditions. What should I know about menopause? During menopause, you may experience a number of symptoms, such as:  Moderate-to-severe hot flashes.  Night sweats.  Decrease in sex drive.  Mood swings.  Headaches.  Tiredness.  Irritability.  Memory problems.  Insomnia.  Choosing to treat or not to treat menopausal changes is an individual decision that you make with your health care provider. What should I know about hormone replacement therapy and supplements? Hormone therapy products are effective for treating symptoms that are associated with menopause, such as hot flashes and night sweats. Hormone replacement carries certain risks, especially as you become older. If you are thinking about using estrogen or estrogen with progestin treatments, discuss the benefits and risks with your health care provider. What should I know about heart disease and stroke? Heart disease, heart attack, and stroke become more likely as you age. This may be due, in part, to the hormonal changes that your body experiences during menopause. These can affect how your body processes dietary fats, triglycerides, and cholesterol. Heart attack and stroke are both medical emergencies. There are many things that you can do to help prevent heart disease  and stroke:  Have your blood pressure checked at least every 1-2 years. High blood pressure causes heart disease and increases the risk of stroke.  If you are 53-22 years old, ask your health care provider if you should take aspirin to prevent a heart attack or a stroke.  Do not use any tobacco products, including cigarettes, chewing tobacco, or electronic cigarettes. If you need help quitting, ask your health care provider.  It is important to eat a healthy diet and maintain a healthy weight. ? Be sure to include plenty of vegetables, fruits, low-fat dairy products, and lean protein. ? Avoid eating foods that are high in solid fats, added sugars, or salt (sodium).  Get regular exercise. This is one of the most important things that you can do for your health. ? Try to exercise for at least 150 minutes each week. The type of exercise that you do should increase your heart rate and make you sweat. This is known as moderate-intensity exercise. ? Try to do strengthening exercises at least twice each week. Do these in addition to the moderate-intensity exercise.  Know your numbers.Ask your health care provider to check your cholesterol and your blood glucose. Continue to have your blood tested as directed by your health care provider.  What should I know about cancer screening? There are several types of cancer. Take the following steps to reduce your risk and to catch any cancer development as early as possible. Breast Cancer  Practice breast self-awareness. ? This means understanding how your breasts normally appear and feel. ? It also means doing regular breast self-exams. Let your health care provider know about any changes, no matter how small.  If you are 40  or older, have a clinician do a breast exam (clinical breast exam or CBE) every year. Depending on your age, family history, and medical history, it may be recommended that you also have a yearly breast X-ray (mammogram).  If you  have a family history of breast cancer, talk with your health care provider about genetic screening.  If you are at high risk for breast cancer, talk with your health care provider about having an MRI and a mammogram every year.  Breast cancer (BRCA) gene test is recommended for women who have family members with BRCA-related cancers. Results of the assessment will determine the need for genetic counseling and BRCA1 and for BRCA2 testing. BRCA-related cancers include these types: ? Breast. This occurs in males or females. ? Ovarian. ? Tubal. This may also be called fallopian tube cancer. ? Cancer of the abdominal or pelvic lining (peritoneal cancer). ? Prostate. ? Pancreatic.  Cervical, Uterine, and Ovarian Cancer Your health care provider may recommend that you be screened regularly for cancer of the pelvic organs. These include your ovaries, uterus, and vagina. This screening involves a pelvic exam, which includes checking for microscopic changes to the surface of your cervix (Pap test).  For women ages 21-65, health care providers may recommend a pelvic exam and a Pap test every three years. For women ages 79-65, they may recommend the Pap test and pelvic exam, combined with testing for human papilloma virus (HPV), every five years. Some types of HPV increase your risk of cervical cancer. Testing for HPV may also be done on women of any age who have unclear Pap test results.  Other health care providers may not recommend any screening for nonpregnant women who are considered low risk for pelvic cancer and have no symptoms. Ask your health care provider if a screening pelvic exam is right for you.  If you have had past treatment for cervical cancer or a condition that could lead to cancer, you need Pap tests and screening for cancer for at least 20 years after your treatment. If Pap tests have been discontinued for you, your risk factors (such as having a new sexual partner) need to be  reassessed to determine if you should start having screenings again. Some women have medical problems that increase the chance of getting cervical cancer. In these cases, your health care provider may recommend that you have screening and Pap tests more often.  If you have a family history of uterine cancer or ovarian cancer, talk with your health care provider about genetic screening.  If you have vaginal bleeding after reaching menopause, tell your health care provider.  There are currently no reliable tests available to screen for ovarian cancer.  Lung Cancer Lung cancer screening is recommended for adults 69-62 years old who are at high risk for lung cancer because of a history of smoking. A yearly low-dose CT scan of the lungs is recommended if you:  Currently smoke.  Have a history of at least 30 pack-years of smoking and you currently smoke or have quit within the past 15 years. A pack-year is smoking an average of one pack of cigarettes per day for one year.  Yearly screening should:  Continue until it has been 15 years since you quit.  Stop if you develop a health problem that would prevent you from having lung cancer treatment.  Colorectal Cancer  This type of cancer can be detected and can often be prevented.  Routine colorectal cancer screening usually begins at  age 42 and continues through age 45.  If you have risk factors for colon cancer, your health care provider may recommend that you be screened at an earlier age.  If you have a family history of colorectal cancer, talk with your health care provider about genetic screening.  Your health care provider may also recommend using home test kits to check for hidden blood in your stool.  A small camera at the end of a tube can be used to examine your colon directly (sigmoidoscopy or colonoscopy). This is done to check for the earliest forms of colorectal cancer.  Direct examination of the colon should be repeated every  5-10 years until age 71. However, if early forms of precancerous polyps or small growths are found or if you have a family history or genetic risk for colorectal cancer, you may need to be screened more often.  Skin Cancer  Check your skin from head to toe regularly.  Monitor any moles. Be sure to tell your health care provider: ? About any new moles or changes in moles, especially if there is a change in a mole's shape or color. ? If you have a mole that is larger than the size of a pencil eraser.  If any of your family members has a history of skin cancer, especially at a young age, talk with your health care provider about genetic screening.  Always use sunscreen. Apply sunscreen liberally and repeatedly throughout the day.  Whenever you are outside, protect yourself by wearing long sleeves, pants, a wide-brimmed hat, and sunglasses.  What should I know about osteoporosis? Osteoporosis is a condition in which bone destruction happens more quickly than new bone creation. After menopause, you may be at an increased risk for osteoporosis. To help prevent osteoporosis or the bone fractures that can happen because of osteoporosis, the following is recommended:  If you are 46-71 years old, get at least 1,000 mg of calcium and at least 600 mg of vitamin D per day.  If you are older than age 55 but younger than age 65, get at least 1,200 mg of calcium and at least 600 mg of vitamin D per day.  If you are older than age 54, get at least 1,200 mg of calcium and at least 800 mg of vitamin D per day.  Smoking and excessive alcohol intake increase the risk of osteoporosis. Eat foods that are rich in calcium and vitamin D, and do weight-bearing exercises several times each week as directed by your health care provider. What should I know about how menopause affects my mental health? Depression may occur at any age, but it is more common as you become older. Common symptoms of depression  include:  Low or sad mood.  Changes in sleep patterns.  Changes in appetite or eating patterns.  Feeling an overall lack of motivation or enjoyment of activities that you previously enjoyed.  Frequent crying spells.  Talk with your health care provider if you think that you are experiencing depression. What should I know about immunizations? It is important that you get and maintain your immunizations. These include:  Tetanus, diphtheria, and pertussis (Tdap) booster vaccine.  Influenza every year before the flu season begins.  Pneumonia vaccine.  Shingles vaccine.  Your health care provider may also recommend other immunizations. This information is not intended to replace advice given to you by your health care provider. Make sure you discuss any questions you have with your health care provider. Document Released: 11/15/2005  Document Revised: 04/12/2016 Document Reviewed: 06/27/2015 Elsevier Interactive Patient Education  2018 [employer]  

## 2017-07-14 NOTE — Progress Notes (Signed)
HPI:  Pt presents to the clinic today for her Medicare Wellness Exam.  Past Medical History:  Diagnosis Date  . Arthritis   . History of chicken pox   . History of colon polyps   . History of mouth cancer     Current Outpatient Prescriptions  Medication Sig Dispense Refill  . aspirin 81 MG tablet Take 81 mg by mouth daily.    . Calcium Carb-Cholecalciferol (CALCIUM 600/VITAMIN D3) 600-800 MG-UNIT TABS Take 2 capsules by mouth daily.    . diclofenac (VOLTAREN) 75 MG EC tablet Take 1 tablet (75 mg total) by mouth 2 (two) times daily between meals as needed. 60 tablet 3  . fluticasone (FLONASE) 50 MCG/ACT nasal spray Place 2 sprays into the nose daily.    . Glucosamine-Chondroit-Vit C-Mn (GLUCOSAMINE 1500 COMPLEX) CAPS Take 2 capsules by mouth daily.    Marland Kitchen HYDROcodone-homatropine (HYCODAN) 5-1.5 MG/5ML syrup Take 5 mLs by mouth at bedtime as needed for cough. 120 mL 0  . Multiple Vitamins-Minerals (MULTIVITAMIN WITH MINERALS) tablet Take 1 tablet by mouth daily.    Marland Kitchen oxybutynin (DITROPAN) 5 MG tablet Take 5 mg by mouth 2 (two) times daily.      No current facility-administered medications for this visit.     No Known Allergies  No family history on file.  Social History   Social History  . Marital status: Married    Spouse name: N/A  . Number of children: N/A  . Years of education: N/A   Occupational History  . Not on file.   Social History Main Topics  . Smoking status: Former Smoker    Quit date: 12/28/1986  . Smokeless tobacco: Never Used  . Alcohol use No  . Drug use: No  . Sexual activity: Not Currently   Other Topics Concern  . Not on file   Social History Narrative  . No narrative on file    Hospitiliaztions: None  Health Maintenance:    Flu: 06/2016  Tetanus: 07/2012  Pneumovax: 07/2007  Prevnar: 07/2014  Zostavax: 07/2008  Mammogram: scheduled 07/2017, last 07/2016  Pap Smear: no longer screening  Bone Density: 08/2016  Colon Screening:  10/2009  Eye Doctor: annually  Dental Exam: annually   Providers:   PCP: Webb Silversmith, NP-C  Orthopedist: Dr. Rush Farmer  ENT: Dr. Wilburn Cornelia    I have personally reviewed and have noted:  1. The patient's medical and social history 2. Their use of alcohol, tobacco or illicit drugs 3. Their current medications and supplements 4. The patient's functional ability including ADL's, fall risks, home safety risks and hearing or visual impairment. 5. Diet and physical activities 6. Evidence for depression or mood disorder  Subjective:   Review of Systems:   Constitutional: Denies fever, malaise, fatigue, headache or abrupt weight changes.  HEENT: Denies eye pain, eye redness, ear pain, ringing in the ears, wax buildup, runny nose, nasal congestion, bloody nose, or sore throat. Respiratory: Denies difficulty breathing, shortness of breath, cough or sputum production.   Cardiovascular: Denies chest pain, chest tightness, palpitations or swelling in the hands or feet.  Gastrointestinal: Denies abdominal pain, bloating, constipation, diarrhea or blood in the stool.  GU: Pt reports urge incontinence. Denies urgency, frequency, pain with urination, burning sensation, blood in urine, odor or discharge. Musculoskeletal: Pt reports right knee pain. Denies decrease in range of motion, difficulty with gait, muscle pain or joint swelling.  Skin: Denies redness, rashes, lesions or ulcercations.  Neurological: Denies dizziness, difficulty with memory, difficulty with speech  or problems with balance and coordination.  Psych: Denies anxiety, depression, SI/HI.  No other specific complaints in a complete review of systems (except as listed in HPI above).  Objective:  PE:   BP 128/70   Pulse 74   Temp 97.8 F (36.6 C) (Oral)   Ht 5\' 2"  (1.575 m)   Wt 158 lb 12 oz (72 kg)   BMI 29.04 kg/m   Wt Readings from Last 3 Encounters:  04/29/17 157 lb (71.2 kg)  03/07/17 157 lb (71.2 kg)  07/11/16 161  lb 8 oz (73.3 kg)    General: Appears her stated age, well developed, well nourished in NAD. Skin: Warm, dry and intact.  HEENT: Head: normal shape and size; Throat/Mouth: TDentures present, mucosa pink and moist, no exudate, lesions or ulcerations noted.  Neck: Neck supple, trachea midline. No masses, lumps or thyromegaly present.  Cardiovascular: Normal rate and rhythm. S1,S2 noted.  No murmur, rubs or gallops noted. No JVD or BLE edema. No carotid bruits noted. Pulmonary/Chest: Normal effort and positive vesicular breath sounds. No respiratory distress. No wheezes, rales or ronchi noted.  Abdomen: Soft and nontender. Normal bowel sounds. No distention or masses noted.  Musculoskeletal: Strength 5/5 BUE/BLE. No signs of joint swelling.  Neurological: Alert and oriented. Cranial nerves II-XII grossly intact. Coordination normal.  Psychiatric: Mood and affect normal. Behavior is normal. Judgment and thought content normal.     BMET    Component Value Date/Time   NA 139 06/24/2016 0853   K 4.2 06/24/2016 0853   CL 102 06/24/2016 0853   CO2 26 06/24/2016 0853   GLUCOSE 98 06/24/2016 0853   BUN 17 06/24/2016 0853   CREATININE 0.59 (L) 06/24/2016 0853   CALCIUM 9.5 06/24/2016 0853   GFRNONAA >89 06/24/2016 0853   GFRAA >89 06/24/2016 0853    Lipid Panel     Component Value Date/Time   CHOL 178 06/24/2016 0853   TRIG 104 06/24/2016 0853   HDL 59 06/24/2016 0853   CHOLHDL 3.0 06/24/2016 0853   VLDL 21 06/24/2016 0853   LDLCALC 98 06/24/2016 0853    CBC    Component Value Date/Time   WBC 5.7 06/24/2016 0909   WBC 5.0 01/04/2011 0641   RBC 4.62 06/24/2016 0909   RBC 4.41 01/04/2011 0641   HGB 14.4 06/24/2016 0909   HGB 13.2 01/04/2011 0641   HCT 40.0 06/24/2016 0909   HCT 38.9 01/04/2011 0641   PLT 161 01/04/2011 0641   MCV 86.6 06/24/2016 0909   MCH 31.1 06/24/2016 0909   MCH 29.9 01/04/2011 0641   MCHC 35.9 (A) 06/24/2016 0909   MCHC 33.9 01/04/2011 0641   RDW  12.6 01/04/2011 0641    Hgb A1C No results found for: HGBA1C    Assessment and Plan:   Medicare Annual Wellness Visit:  Diet: She does eat meat. She consumes fruits and veggies daily. She occassionally eats fried foods. She drinks mostly water. Physical activity: She is walking daily. Depression/mood screen: Negative Hearing: Intact to whispered voice Visual acuity: Grossly normal, performs annual eye exam  ADLs: Capable Fall risk: None Home safety: Good Cognitive evaluation: Intact to orientation, naming, recall and repetition EOL planning: Adv directives, full code/ I agree  Preventative Medicine: Flu shot today. Tetanus, pneumovax, prevnar and zostovax UTD. Mammogram scheduled. She no longer wants cervical cancer screening. Colon screening and bone density UTD. Encouraged her to consume a balanced diet and exercise regimen. Advised her to see an eye doctor and dentist annually.  Will check CBC, CMET, Lipid and Vit D today.   Next appointment: 1 year, Medicare Wellness Exam   Webb Silversmith, NP

## 2017-07-15 LAB — COMPREHENSIVE METABOLIC PANEL
ALBUMIN: 4.2 g/dL (ref 3.5–5.2)
ALK PHOS: 58 U/L (ref 39–117)
ALT: 15 U/L (ref 0–35)
AST: 17 U/L (ref 0–37)
BILIRUBIN TOTAL: 0.5 mg/dL (ref 0.2–1.2)
BUN: 18 mg/dL (ref 6–23)
CALCIUM: 9.9 mg/dL (ref 8.4–10.5)
CO2: 29 mEq/L (ref 19–32)
CREATININE: 0.74 mg/dL (ref 0.40–1.20)
Chloride: 101 mEq/L (ref 96–112)
GFR: 81.11 mL/min (ref >60.00)
Glucose, Bld: 91 mg/dL (ref 70–99)
Potassium: 4.5 mEq/L (ref 3.5–5.1)
Sodium: 139 mEq/L (ref 135–145)
TOTAL PROTEIN: 7.1 g/dL (ref 6.0–8.3)

## 2017-07-15 LAB — LIPID PANEL
CHOL/HDL RATIO: 3
CHOLESTEROL: 192 mg/dL (ref 0–200)
HDL: 57.1 mg/dL (ref >39.00)
LDL Cholesterol: 115 mg/dL — ABNORMAL HIGH (ref 0–99)
NonHDL: 134.89
TRIGLYCERIDES: 101 mg/dL (ref 0.0–149.0)
VLDL: 20.2 mg/dL (ref 0.0–40.0)

## 2017-07-15 LAB — CBC
HCT: 43.9 % (ref 36.0–46.0)
Hemoglobin: 14.6 g/dL (ref 12.0–15.0)
MCHC: 33.2 g/dL (ref 30.0–36.0)
MCV: 90.7 fl (ref 78.0–100.0)
PLATELETS: 187 10*3/uL (ref 150.0–400.0)
RBC: 4.84 Mil/uL (ref 3.87–5.11)
RDW: 13.4 % (ref 11.5–15.5)
WBC: 6.3 10*3/uL (ref 4.0–10.5)

## 2017-07-15 LAB — VITAMIN D 25 HYDROXY (VIT D DEFICIENCY, FRACTURES): VITD: 58.14 ng/mL (ref 30.00–100.00)

## 2017-07-21 ENCOUNTER — Ambulatory Visit
Admission: RE | Admit: 2017-07-21 | Discharge: 2017-07-21 | Disposition: A | Discharge status: Home / Self Care | Payer: PPO | Source: Ambulatory Visit | Attending: Obstetrics and Gynecology | Admitting: Obstetrics and Gynecology

## 2017-07-21 DIAGNOSIS — Z1231 Encounter for screening mammogram for malignant neoplasm of breast: Secondary | ICD-10-CM

## 2017-07-22 ENCOUNTER — Telehealth (INDEPENDENT_AMBULATORY_CARE_PROVIDER_SITE_OTHER): Payer: Self-pay | Admitting: Orthopaedic Surgery

## 2017-07-22 NOTE — Telephone Encounter (Signed)
Do you want to see her in office or just go ahead and schedule

## 2017-07-22 NOTE — Telephone Encounter (Signed)
Patient called stating that she is ready to discuss surgery for her knee.  CB#[phone].  Thank you.

## 2017-07-28 ENCOUNTER — Ambulatory Visit (INDEPENDENT_AMBULATORY_CARE_PROVIDER_SITE_OTHER): Payer: PPO | Admitting: Physician Assistant

## 2017-07-28 ENCOUNTER — Encounter (INDEPENDENT_AMBULATORY_CARE_PROVIDER_SITE_OTHER): Payer: Self-pay | Admitting: Physician Assistant

## 2017-07-28 ENCOUNTER — Ambulatory Visit (INDEPENDENT_AMBULATORY_CARE_PROVIDER_SITE_OTHER): Payer: PPO

## 2017-07-28 DIAGNOSIS — M545 Low back pain, unspecified: Secondary | ICD-10-CM | POA: Insufficient documentation

## 2017-07-28 DIAGNOSIS — M1711 Unilateral primary osteoarthritis, right knee: Secondary | ICD-10-CM

## 2017-07-28 MED ORDER — BACLOFEN 10 MG PO TABS: 10.0000 mg | ORAL_TABLET | Freq: Three times a day (TID) | ORAL | 1 refills | Status: DC

## 2017-07-28 NOTE — Progress Notes (Signed)
Office Visit Note   Patient: [name]           Date of Birth: 1940/12/24           MRN: [id number] Visit Date: 07/28/2017              Requested by: Jearld Fenton, NP [address] [place], [place] [address] PCP: Jearld Fenton, NP   Assessment & Plan: Visit Diagnoses:  1. Acute left-sided low back pain without sciatica   2. Unilateral primary osteoarthritis, right knee     Plan: We will place her on baclofen and she will begin taking her diclofenac again.  Offered formal therapy for her back she declines.  Therefore back exercises were reviewed on handouts which were given for her to take home.  In regards to her knee we will set her up for a right partial knee replacement.  Questions encouraged and answered at length.  Risk reviewed with the patient.  Follow-Up Instructions: No Follow-up on file.   Orders:  Orders Placed This Encounter  Procedures  . XR Lumbar Spine 2-3 Views   Meds ordered this encounter  Medications  . baclofen (LIORESAL) 10 MG tablet    Sig: Take 1 tablet (10 mg total) by mouth 3 (three) times daily.    Dispense:  30 each    Refill:  1      Procedures: No procedures performed   Clinical Data: No additional findings.   Subjective: Chief Complaint  Patient presents with  . Lower Back - Pain    HPI Ms. Parkey is well-known to [name] service returns today due to right knee pain.  She states the injection on 06/24/2017 only gave her relief for a couple of days.  She is now ready to discuss surgery on her right knee.  She has medial compartmental arthritis of the right knee.  Pain is interfering with her daily activities and keeping her from doing activities that she enjoys.. She also is having back pain for the last 2 days.  Pains lower left back region.  She states she is having spasms.  No radicular symptoms or waking pain.  She did take some baclofen she had from previous back pain that has helped some.  She has not been on  her diclofenac she is unsure if she could take that with the muscle relaxant. Review of Systems Please see HPI otherwise negative Objective: Vital Signs: There were no vitals taken for this visit.  Physical Exam  Constitutional: She is oriented to person, place, and time. She appears well-developed and well-nourished. No distress.  Pulmonary/Chest: Effort normal.  Neurological: She is alert and oriented to person, place, and time.  Skin: She is not diaphoretic.  Psychiatric: She has a normal mood and affect.    Ortho Exam Right knee tenderness along the medial joint line.  No instability with valgus varus stressing.  She has full range of motion at the knee.  No effusion abnormal warmth or erythema. Specialty Comments:  No specialty comments available.  Imaging: Xr Lumbar Spine 2-3 Views  Result Date: 07/28/2017 Lumbar spine AP and lateral: No acute fracture.  Disc space well maintained.  No spondylolisthesis.  Positive for facet arthritic changes.  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  Arthrosclerosis of the aorta is noted.    PMFS History: Patient Active Problem List   Diagnosis Date Noted  . Acute left-sided low back pain without sciatica 07/28/2017  . 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  Degenerative arthritis of knee, bilateral 07/11/2016  . 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 Allergic rhinitis 12/22/2011   Past Medical History:  Diagnosis Date  . Arthritis   . History of chicken pox   . History of colon polyps   . History of mouth cancer     No family history on file.  Past Surgical History:  Procedure Laterality Date  . APPENDECTOMY    . KNEE ARTHROSCOPY W/ MENISCAL REPAIR Left    Social History   Occupational History  . Not on file.   Social History Main Topics  . Smoking status: Former Smoker    Quit date: 12/28/1986  . Smokeless tobacco: Never Used  . Alcohol use No  . Drug use: No  . Sexual activity: Not Currently

## 2017-08-04 ENCOUNTER — Encounter (INDEPENDENT_AMBULATORY_CARE_PROVIDER_SITE_OTHER): Payer: Self-pay | Admitting: Physician Assistant

## 2017-08-13 DIAGNOSIS — N3941 Urge incontinence: Secondary | ICD-10-CM | POA: Diagnosis not present

## 2017-08-27 DIAGNOSIS — N3941 Urge incontinence: Secondary | ICD-10-CM | POA: Diagnosis not present

## 2017-09-04 ENCOUNTER — Other Ambulatory Visit (INDEPENDENT_AMBULATORY_CARE_PROVIDER_SITE_OTHER): Payer: Self-pay | Admitting: Physician Assistant

## 2017-09-09 ENCOUNTER — Other Ambulatory Visit: Payer: Self-pay

## 2017-09-09 ENCOUNTER — Encounter (HOSPITAL_COMMUNITY)
Admission: RE | Admit: 2017-09-09 | Discharge: 2017-09-09 | Disposition: A | Discharge status: Home / Self Care | Payer: PPO | Source: Ambulatory Visit | Attending: Orthopaedic Surgery | Admitting: Orthopaedic Surgery

## 2017-09-09 ENCOUNTER — Encounter (HOSPITAL_COMMUNITY): Payer: Self-pay

## 2017-09-09 DIAGNOSIS — M1711 Unilateral primary osteoarthritis, right knee: Secondary | ICD-10-CM | POA: Diagnosis not present

## 2017-09-09 DIAGNOSIS — Z01818 Encounter for other preprocedural examination: Secondary | ICD-10-CM | POA: Diagnosis not present

## 2017-09-09 HISTORY — DX: Unilateral primary osteoarthritis, unspecified knee: M17.10

## 2017-09-09 HISTORY — DX: Malignant (primary) neoplasm, unspecified: C80.1

## 2017-09-09 HISTORY — DX: Osteoarthritis of knee, unspecified: M17.9

## 2017-09-09 LAB — BASIC METABOLIC PANEL
Anion gap: 9 (ref 5–15)
BUN: 16 mg/dL (ref 6–20)
CHLORIDE: 100 mmol/L — AB (ref 101–111)
CO2: 26 mmol/L (ref 22–32)
CREATININE: 0.81 mg/dL (ref 0.44–1.00)
Calcium: 9.6 mg/dL (ref 8.9–10.3)
GFR calc Af Amer: >60 mL/min (ref >60)
GFR calc non Af Amer: >60 mL/min (ref >60)
GLUCOSE: 106 mg/dL — AB (ref 65–99)
Potassium: 3.9 mmol/L (ref 3.5–5.1)
SODIUM: 135 mmol/L (ref 135–145)

## 2017-09-09 LAB — SURGICAL PCR SCREEN
MRSA, PCR: NEGATIVE
STAPHYLOCOCCUS AUREUS: NEGATIVE

## 2017-09-09 LAB — CBC
HCT: 41.5 % (ref 36.0–46.0)
Hemoglobin: 13.9 g/dL (ref 12.0–15.0)
MCH: 30.1 pg (ref 26.0–34.0)
MCHC: 33.5 g/dL (ref 30.0–36.0)
MCV: 89.8 fL (ref 78.0–100.0)
PLATELETS: 176 10*3/uL (ref 150–400)
RBC: 4.62 MIL/uL (ref 3.87–5.11)
RDW: 13.2 % (ref 11.5–15.5)
WBC: 7.2 10*3/uL (ref 4.0–10.5)

## 2017-09-09 NOTE — Pre-Procedure Instructions (Signed)
[name]  09/09/2017      [facility] - [place], [place] - [address] [place] [address] Phone: [phone] Fax: [phone]    Your procedure is scheduled on Tuesday 09/16/2017.  Report to [facility] Admitting at 1:00 P.M.  Call this number if you have problems the morning of surgery:  [phone]   Remember:  Do not eat food or drink liquids after midnight.   Continue all medications as directed by your physician except follow these medication instructions before surgery below   Take these medicines the morning of surgery with A SIP OF WATER: tolterodine (Detrol)  7 days prior to surgery STOP taking any Diclofenac (Voltaren), Turmeric, Aspirin (unless otherwise instructed by your surgeon), Aleve, Naproxen, Ibuprofen, Motrin, Advil, Goody's, BC's, all herbal medications, fish oil, and all vitamins.    Do not wear jewelry, make-up or nail polish.  Do not wear lotions, powders, or perfumes, or deodorant.  Do not shave 48 hours prior to surgery.    Do not bring valuables to the hospital.  [facility] is not responsible for any belongings or valuables.  Contacts, eyeglasses, dentures or bridgework may not be worn into surgery.  Leave your suitcase in the car.  After surgery it may be brought to your room.  For patients admitted to the hospital, discharge time will be determined by your treatment team.  Patients discharged the day of surgery will not be allowed to drive home.   Name and phone number of your driver:    Special instructions:   [facility]- Preparing For Surgery  Before surgery, you can play an important role. Because skin is not sterile, your skin needs to be as free of germs as possible. You can reduce the number of germs on your skin by washing with CHG (chlorahexidine gluconate) Soap before surgery.  CHG is an antiseptic cleaner which kills germs and bonds with the skin to continue  killing germs even after washing.  Please do not use if you have an allergy to CHG or antibacterial soaps. If your skin becomes reddened/irritated stop using the CHG.  Do not shave (including legs and underarms) for at least 48 hours prior to first CHG shower. It is OK to shave your face.  Please follow these instructions carefully.   1. Shower the NIGHT BEFORE SURGERY and the MORNING OF SURGERY with CHG.   2. If you chose to wash your hair, wash your hair first as usual with your normal shampoo.  3. After you shampoo, rinse your hair and body thoroughly to remove the shampoo.  4. Use CHG as you would any other liquid soap. You can apply CHG directly to the skin and wash gently with a scrungie or a clean washcloth.   5. Apply the CHG Soap to your body ONLY FROM THE NECK DOWN.  Do not use on open wounds or open sores. Avoid contact with your eyes, ears, mouth and genitals (private parts). Wash Face and genitals (private parts)  with your normal soap.  6. Wash thoroughly, paying special attention to the area where your surgery will be performed.  7. Thoroughly rinse your body with warm water from the neck down.  8. DO NOT shower/wash with your normal soap after using and rinsing off the CHG Soap.  9. Pat yourself dry with a CLEAN TOWEL.  10. Wear CLEAN PAJAMAS to bed the night before surgery, wear comfortable clothes  the morning of surgery  11. Place CLEAN SHEETS on your bed the night of your first shower and DO NOT SLEEP WITH PETS.    Day of Surgery: Shower as stated above. Do not apply any deodorants/lotions. Please wear clean clothes to the hospital/surgery center.      Please read over the following fact sheets that you were given.

## 2017-09-09 NOTE — Progress Notes (Signed)
PCP - Dr. Costella Hatcher  Cardiologist - Denies  Chest x-ray - Denies  EKG - Denies  Stress Test - 07/2015, per pt  ECHO - 07/27/15 (E)  Cardiac Cath - Denies  Sleep Study - No CPAP - None  LABS- Pending CBC, BMP   Pt denies having chest pain, sob, or fever at this time. All instructions explained to the pt, with a verbal understanding of the material. Pt agrees to go over the instructions while at home for a better understanding. The opportunity to ask questions was provided.

## 2017-09-15 MED ORDER — CEFAZOLIN SODIUM-DEXTROSE 2-4 GM/100ML-% IV SOLN
2.0000 g | INTRAVENOUS | Status: AC
  Administered 2017-09-16: 2 g via INTRAVENOUS
  Filled 2017-09-15 (×2): qty 100

## 2017-09-16 ENCOUNTER — Observation Stay (HOSPITAL_COMMUNITY)
Admission: RE | Admit: 2017-09-16 | Discharge: 2017-09-17 | Disposition: A | Discharge status: Home / Self Care | Payer: PPO | Source: Ambulatory Visit | Attending: Orthopaedic Surgery | Admitting: Orthopaedic Surgery

## 2017-09-16 ENCOUNTER — Ambulatory Visit (HOSPITAL_COMMUNITY): Payer: PPO | Admitting: Anesthesiology

## 2017-09-16 ENCOUNTER — Encounter (HOSPITAL_COMMUNITY)
Admission: RE | Disposition: A | Discharge status: Home / Self Care | Payer: Self-pay | Source: Ambulatory Visit | Attending: Orthopaedic Surgery

## 2017-09-16 ENCOUNTER — Encounter (HOSPITAL_COMMUNITY): Payer: Self-pay

## 2017-09-16 ENCOUNTER — Other Ambulatory Visit: Payer: Self-pay

## 2017-09-16 ENCOUNTER — Observation Stay (HOSPITAL_COMMUNITY): Payer: PPO

## 2017-09-16 DIAGNOSIS — G5603 Carpal tunnel syndrome, bilateral upper limbs: Secondary | ICD-10-CM | POA: Diagnosis not present

## 2017-09-16 DIAGNOSIS — M1711 Unilateral primary osteoarthritis, right knee: Secondary | ICD-10-CM | POA: Diagnosis not present

## 2017-09-16 DIAGNOSIS — M170 Bilateral primary osteoarthritis of knee: Secondary | ICD-10-CM | POA: Diagnosis not present

## 2017-09-16 DIAGNOSIS — Z79899 Other long term (current) drug therapy: Secondary | ICD-10-CM | POA: Insufficient documentation

## 2017-09-16 DIAGNOSIS — Z87891 Personal history of nicotine dependence: Secondary | ICD-10-CM | POA: Insufficient documentation

## 2017-09-16 DIAGNOSIS — Z7982 Long term (current) use of aspirin: Secondary | ICD-10-CM | POA: Insufficient documentation

## 2017-09-16 DIAGNOSIS — Z96651 Presence of right artificial knee joint: Secondary | ICD-10-CM

## 2017-09-16 DIAGNOSIS — Z471 Aftercare following joint replacement surgery: Secondary | ICD-10-CM | POA: Diagnosis not present

## 2017-09-16 DIAGNOSIS — G8918 Other acute postprocedural pain: Secondary | ICD-10-CM | POA: Diagnosis not present

## 2017-09-16 DIAGNOSIS — J309 Allergic rhinitis, unspecified: Secondary | ICD-10-CM | POA: Diagnosis not present

## 2017-09-16 HISTORY — PX: PARTIAL KNEE ARTHROPLASTY: SHX2174

## 2017-09-16 SURGERY — ARTHROPLASTY, KNEE, UNICOMPARTMENTAL
Anesthesia: Spinal | Laterality: Right

## 2017-09-16 MED ORDER — PROPOFOL 10 MG/ML IV BOLUS
INTRAVENOUS | Status: AC
  Filled 2017-09-16: qty 20

## 2017-09-16 MED ORDER — FENTANYL CITRATE (PF) 100 MCG/2ML IJ SOLN
INTRAMUSCULAR | Status: DC | PRN
  Administered 2017-09-16 (×2): 25 ug via INTRAVENOUS

## 2017-09-16 MED ORDER — DIPHENHYDRAMINE HCL 12.5 MG/5ML PO ELIX
12.5000 mg | ORAL_SOLUTION | ORAL | Status: DC | PRN
Start: 2017-09-16 — End: 2017-09-17
  Administered 2017-09-17: 25 mg via ORAL
  Filled 2017-09-16: qty 10

## 2017-09-16 MED ORDER — DEXAMETHASONE SODIUM PHOSPHATE 10 MG/ML IJ SOLN
INTRAMUSCULAR | Status: AC
  Filled 2017-09-16: qty 1

## 2017-09-16 MED ORDER — PROMETHAZINE HCL 25 MG/ML IJ SOLN
6.2500 mg | INTRAMUSCULAR | Status: DC | PRN
Start: 2017-09-16 — End: 2017-09-16

## 2017-09-16 MED ORDER — FENTANYL CITRATE (PF) 250 MCG/5ML IJ SOLN
INTRAMUSCULAR | Status: AC
  Filled 2017-09-16: qty 5

## 2017-09-16 MED ORDER — ACETAMINOPHEN 650 MG RE SUPP: 650.0000 mg | RECTAL | Status: DC | PRN

## 2017-09-16 MED ORDER — ONDANSETRON HCL 4 MG PO TABS: 4.0000 mg | ORAL_TABLET | Freq: Four times a day (QID) | ORAL | Status: DC | PRN

## 2017-09-16 MED ORDER — ONDANSETRON HCL 4 MG/2ML IJ SOLN: 4.0000 mg | Freq: Four times a day (QID) | INTRAMUSCULAR | Status: DC | PRN

## 2017-09-16 MED ORDER — METOCLOPRAMIDE HCL 5 MG/ML IJ SOLN: 5.0000 mg | Freq: Three times a day (TID) | INTRAMUSCULAR | Status: DC | PRN

## 2017-09-16 MED ORDER — HYDROMORPHONE HCL 1 MG/ML IJ SOLN: 1.0000 mg | INTRAMUSCULAR | Status: DC | PRN

## 2017-09-16 MED ORDER — DOCUSATE SODIUM 100 MG PO CAPS
100.0000 mg | ORAL_CAPSULE | Freq: Two times a day (BID) | ORAL | Status: DC
  Administered 2017-09-16 – 2017-09-17 (×2): 100 mg via ORAL
  Filled 2017-09-16 (×2): qty 1

## 2017-09-16 MED ORDER — ALUM & MAG HYDROXIDE-SIMETH 200-200-20 MG/5ML PO SUSP: 30.0000 mL | ORAL | Status: DC | PRN

## 2017-09-16 MED ORDER — SODIUM CHLORIDE 0.9 % IV SOLN
INTRAVENOUS | Status: DC
  Administered 2017-09-16: 18:00:00 via INTRAVENOUS

## 2017-09-16 MED ORDER — METOCLOPRAMIDE HCL 5 MG PO TABS: 5.0000 mg | ORAL_TABLET | Freq: Three times a day (TID) | ORAL | Status: DC | PRN

## 2017-09-16 MED ORDER — HYDROCODONE-ACETAMINOPHEN 5-325 MG PO TABS
1.0000 | ORAL_TABLET | ORAL | Status: DC | PRN
  Administered 2017-09-16: 2 via ORAL
  Administered 2017-09-17: 1 via ORAL
  Filled 2017-09-16: qty 2
  Filled 2017-09-16: qty 1

## 2017-09-16 MED ORDER — METHOCARBAMOL 1000 MG/10ML IJ SOLN: 500.0000 mg | Freq: Four times a day (QID) | INTRAVENOUS | Status: DC | PRN

## 2017-09-16 MED ORDER — LIDOCAINE 2% (20 MG/ML) 5 ML SYRINGE
INTRAMUSCULAR | Status: AC
  Filled 2017-09-16: qty 5

## 2017-09-16 MED ORDER — ONDANSETRON HCL 4 MG/2ML IJ SOLN
INTRAMUSCULAR | Status: AC
  Filled 2017-09-16: qty 2

## 2017-09-16 MED ORDER — CALCIUM CARB-CHOLECALCIFEROL 600-800 MG-UNIT PO TABS: 1.0000 | ORAL_TABLET | Freq: Two times a day (BID) | ORAL | Status: DC

## 2017-09-16 MED ORDER — SODIUM CHLORIDE 0.9 % IR SOLN
Status: DC | PRN
  Administered 2017-09-16: 3000 mL

## 2017-09-16 MED ORDER — PHENYLEPHRINE HCL 10 MG/ML IJ SOLN
INTRAMUSCULAR | Status: DC | PRN
  Administered 2017-09-16: 30 ug/min via INTRAVENOUS

## 2017-09-16 MED ORDER — DEXMEDETOMIDINE HCL 200 MCG/2ML IV SOLN
INTRAVENOUS | Status: DC | PRN
  Administered 2017-09-16: 8 ug via INTRAVENOUS

## 2017-09-16 MED ORDER — MEPERIDINE HCL 25 MG/ML IJ SOLN: 6.2500 mg | INTRAMUSCULAR | Status: DC | PRN

## 2017-09-16 MED ORDER — PROPOFOL 500 MG/50ML IV EMUL
INTRAVENOUS | Status: DC | PRN
  Administered 2017-09-16: 75 ug/kg/min via INTRAVENOUS

## 2017-09-16 MED ORDER — SUGAMMADEX SODIUM 500 MG/5ML IV SOLN
INTRAVENOUS | Status: AC
  Filled 2017-09-16: qty 5

## 2017-09-16 MED ORDER — SUCCINYLCHOLINE CHLORIDE 200 MG/10ML IV SOSY
PREFILLED_SYRINGE | INTRAVENOUS | Status: AC
  Filled 2017-09-16: qty 10

## 2017-09-16 MED ORDER — OXYCODONE HCL 5 MG PO TABS
10.0000 mg | ORAL_TABLET | ORAL | Status: DC | PRN
  Administered 2017-09-16 – 2017-09-17 (×3): 10 mg via ORAL
  Filled 2017-09-16 (×4): qty 2

## 2017-09-16 MED ORDER — LIDOCAINE 2% (20 MG/ML) 5 ML SYRINGE
INTRAMUSCULAR | Status: DC | PRN
  Administered 2017-09-16: 40 mg via INTRAVENOUS

## 2017-09-16 MED ORDER — LACTATED RINGERS IV SOLN
INTRAVENOUS | Status: DC
  Administered 2017-09-16 (×2): via INTRAVENOUS

## 2017-09-16 MED ORDER — ACETAMINOPHEN 325 MG PO TABS
650.0000 mg | ORAL_TABLET | ORAL | Status: DC | PRN
  Filled 2017-09-16: qty 2

## 2017-09-16 MED ORDER — ROPIVACAINE HCL 7.5 MG/ML IJ SOLN
INTRAMUSCULAR | Status: DC | PRN
  Administered 2017-09-16: 20 mL via PERINEURAL

## 2017-09-16 MED ORDER — PHENOL 1.4 % MT LIQD: 1.0000 | OROMUCOSAL | Status: DC | PRN

## 2017-09-16 MED ORDER — METHOCARBAMOL 500 MG PO TABS
500.0000 mg | ORAL_TABLET | Freq: Four times a day (QID) | ORAL | Status: DC | PRN
  Administered 2017-09-16 – 2017-09-17 (×2): 500 mg via ORAL
  Filled 2017-09-16 (×2): qty 1

## 2017-09-16 MED ORDER — ONDANSETRON HCL 4 MG/2ML IJ SOLN
INTRAMUSCULAR | Status: DC | PRN
  Administered 2017-09-16: 4 mg via INTRAVENOUS

## 2017-09-16 MED ORDER — FENTANYL CITRATE (PF) 100 MCG/2ML IJ SOLN: 25.0000 ug | INTRAMUSCULAR | Status: DC | PRN

## 2017-09-16 MED ORDER — FLUTICASONE PROPIONATE 50 MCG/ACT NA SUSP: 2.0000 | Freq: Every day | NASAL | Status: DC

## 2017-09-16 MED ORDER — MIDAZOLAM HCL 2 MG/2ML IJ SOLN
INTRAMUSCULAR | Status: AC
  Administered 2017-09-16: 1 mg
  Filled 2017-09-16: qty 2

## 2017-09-16 MED ORDER — ADULT MULTIVITAMIN W/MINERALS CH
1.0000 | ORAL_TABLET | Freq: Every day | ORAL | Status: DC
  Administered 2017-09-16 – 2017-09-17 (×2): 1 via ORAL
  Filled 2017-09-16 (×2): qty 1

## 2017-09-16 MED ORDER — OXYBUTYNIN CHLORIDE ER 5 MG PO TB24: 5.0000 mg | ORAL_TABLET | Freq: Every day | ORAL | Status: DC

## 2017-09-16 MED ORDER — DEXAMETHASONE SODIUM PHOSPHATE 10 MG/ML IJ SOLN
INTRAMUSCULAR | Status: DC | PRN
  Administered 2017-09-16: 10 mg via INTRAVENOUS

## 2017-09-16 MED ORDER — FENTANYL CITRATE (PF) 100 MCG/2ML IJ SOLN
INTRAMUSCULAR | Status: AC
  Administered 2017-09-16: 50 ug
  Filled 2017-09-16: qty 2

## 2017-09-16 MED ORDER — LACTATED RINGERS IV SOLN: INTRAVENOUS | Status: DC

## 2017-09-16 MED ORDER — CHLORHEXIDINE GLUCONATE 4 % EX LIQD: 60.0000 mL | Freq: Once | CUTANEOUS | Status: DC

## 2017-09-16 MED ORDER — MENTHOL 3 MG MT LOZG: 1.0000 | LOZENGE | OROMUCOSAL | Status: DC | PRN

## 2017-09-16 MED ORDER — BUPIVACAINE IN DEXTROSE 0.75-8.25 % IT SOLN
INTRATHECAL | Status: DC | PRN
  Administered 2017-09-16: 1.6 mL via INTRATHECAL

## 2017-09-16 MED ORDER — PHENYLEPHRINE HCL 10 MG/ML IJ SOLN
INTRAMUSCULAR | Status: AC
  Filled 2017-09-16: qty 1

## 2017-09-16 MED ORDER — ASPIRIN EC 325 MG PO TBEC
325.0000 mg | DELAYED_RELEASE_TABLET | Freq: Two times a day (BID) | ORAL | Status: DC
  Administered 2017-09-16 – 2017-09-17 (×2): 325 mg via ORAL
  Filled 2017-09-16 (×2): qty 1

## 2017-09-16 MED ORDER — 0.9 % SODIUM CHLORIDE (POUR BTL) OPTIME
TOPICAL | Status: DC | PRN
  Administered 2017-09-16: 1000 mL

## 2017-09-16 MED ORDER — CEFAZOLIN SODIUM-DEXTROSE 1-4 GM/50ML-% IV SOLN
1.0000 g | Freq: Four times a day (QID) | INTRAVENOUS | Status: AC
  Administered 2017-09-16 – 2017-09-17 (×2): 1 g via INTRAVENOUS
  Filled 2017-09-16 (×4): qty 50

## 2017-09-16 SURGICAL SUPPLY — 68 items
BAG BILE T-TUBES STRL (MISCELLANEOUS) IMPLANT
BANDAGE ACE 6X5 VEL STRL LF (GAUZE/BANDAGES/DRESSINGS) ×2 IMPLANT
BANDAGE ESMARK 6X9 LF (GAUZE/BANDAGES/DRESSINGS) ×1 IMPLANT
BENZOIN TINCTURE PRP APPL 2/3 (GAUZE/BANDAGES/DRESSINGS) ×2 IMPLANT
BNDG ESMARK 6X9 LF (GAUZE/BANDAGES/DRESSINGS) ×2
BONE CEMENT PALACOSE (Cement) ×4 IMPLANT
BOWL SMART MIX CTS (DISPOSABLE) ×2 IMPLANT
CAPT KNEE PARTIAL 2 ×2 IMPLANT
CEMENT BONE PALACOSE (Cement) ×2 IMPLANT
CLSR STERI-STRIP ANTIMIC 1/2X4 (GAUZE/BANDAGES/DRESSINGS) ×2 IMPLANT
COVER SURGICAL LIGHT HANDLE (MISCELLANEOUS) ×2 IMPLANT
CUFF TOURNIQUET SINGLE 34IN LL (TOURNIQUET CUFF) ×2 IMPLANT
CUFF TOURNIQUET SINGLE 44IN (TOURNIQUET CUFF) IMPLANT
DRAPE HALF SHEET 40X57 (DRAPES) ×2 IMPLANT
DRAPE INCISE IOBAN 66X45 STRL (DRAPES) IMPLANT
DRAPE ORTHO SPLIT 77X108 STRL (DRAPES) ×2
DRAPE SURG ORHT 6 SPLT 77X108 (DRAPES) ×2 IMPLANT
DRAPE U-SHAPE 47X51 STRL (DRAPES) ×2 IMPLANT
DRSG PAD ABDOMINAL 8X10 ST (GAUZE/BANDAGES/DRESSINGS) ×2 IMPLANT
DURAPREP 26ML APPLICATOR (WOUND CARE) ×2 IMPLANT
ELECT CAUTERY BLADE 6.4 (BLADE) ×2 IMPLANT
ELECT REM PT RETURN 9FT ADLT (ELECTROSURGICAL) ×2
ELECTRODE REM PT RTRN 9FT ADLT (ELECTROSURGICAL) ×1 IMPLANT
FACESHIELD WRAPAROUND (MASK) ×4 IMPLANT
GAUZE SPONGE 4X4 12PLY STRL (GAUZE/BANDAGES/DRESSINGS) ×2 IMPLANT
GAUZE XEROFORM 1X8 LF (GAUZE/BANDAGES/DRESSINGS) ×2 IMPLANT
GLOVE BIOGEL PI IND STRL 8 (GLOVE) ×2 IMPLANT
GLOVE BIOGEL PI INDICATOR 8 (GLOVE) ×2
GLOVE ORTHO TXT STRL SZ7.5 (GLOVE) ×2 IMPLANT
GLOVE SURG ORTHO 8.0 STRL STRW (GLOVE) ×2 IMPLANT
GOWN STRL REUS W/ TWL LRG LVL3 (GOWN DISPOSABLE) ×2 IMPLANT
GOWN STRL REUS W/ TWL XL LVL3 (GOWN DISPOSABLE) ×2 IMPLANT
GOWN STRL REUS W/TWL LRG LVL3 (GOWN DISPOSABLE) ×2
GOWN STRL REUS W/TWL XL LVL3 (GOWN DISPOSABLE) ×2
HANDPIECE INTERPULSE COAX TIP (DISPOSABLE) ×1
IMMOBILIZER KNEE 22 UNIV (SOFTGOODS) ×2 IMPLANT
KIT BASIN OR (CUSTOM PROCEDURE TRAY) ×2 IMPLANT
KIT ROOM TURNOVER OR (KITS) ×2 IMPLANT
LEGGING LITHOTOMY PAIR STRL (DRAPES) ×2 IMPLANT
MANIFOLD NEPTUNE II (INSTRUMENTS) ×2 IMPLANT
NDL SAFETY ECLIPSE 18X1.5 (NEEDLE) ×1 IMPLANT
NEEDLE HYPO 18GX1.5 SHARP (NEEDLE) ×1
NS IRRIG 1000ML POUR BTL (IV SOLUTION) ×2 IMPLANT
PACK BLADE SAW RECIP 70 3 PT (BLADE) ×2 IMPLANT
PACK TOTAL JOINT (CUSTOM PROCEDURE TRAY) ×2 IMPLANT
PAD ARMBOARD 7.5X6 YLW CONV (MISCELLANEOUS) ×2 IMPLANT
PADDING CAST ABS 6INX4YD NS (CAST SUPPLIES) ×1
PADDING CAST ABS COTTON 6X4 NS (CAST SUPPLIES) ×1 IMPLANT
PADDING CAST COTTON 6X4 STRL (CAST SUPPLIES) ×2 IMPLANT
SET HNDPC FAN SPRY TIP SCT (DISPOSABLE) ×1 IMPLANT
STAPLER VISISTAT 35W (STAPLE) IMPLANT
STRIP CLOSURE SKIN 1/2X4 (GAUZE/BANDAGES/DRESSINGS) ×2 IMPLANT
SUCTION FRAZIER HANDLE 10FR (MISCELLANEOUS) ×1
SUCTION TUBE FRAZIER 10FR DISP (MISCELLANEOUS) ×1 IMPLANT
SUT MNCRL AB 4-0 PS2 18 (SUTURE) ×2 IMPLANT
SUT VIC AB 0 CT1 27 (SUTURE) ×1
SUT VIC AB 0 CT1 27XBRD ANBCTR (SUTURE) ×1 IMPLANT
SUT VIC AB 1 CT1 27 (SUTURE) ×1
SUT VIC AB 1 CT1 27XBRD ANBCTR (SUTURE) ×1 IMPLANT
SUT VIC AB 2-0 CT1 27 (SUTURE) ×1
SUT VIC AB 2-0 CT1 TAPERPNT 27 (SUTURE) ×1 IMPLANT
SYR 50ML LL SCALE MARK (SYRINGE) ×2 IMPLANT
TOWEL OR 17X24 6PK STRL BLUE (TOWEL DISPOSABLE) ×2 IMPLANT
TOWEL OR 17X26 10 PK STRL BLUE (TOWEL DISPOSABLE) ×2 IMPLANT
TRAY CATH 16FR W/PLASTIC CATH (SET/KITS/TRAYS/PACK) IMPLANT
TRAY FOLEY W/METER SILVER 16FR (SET/KITS/TRAYS/PACK) ×2 IMPLANT
WATER STERILE IRR 1000ML POUR (IV SOLUTION) IMPLANT
WRAP KNEE MAXI GEL POST OP (GAUZE/BANDAGES/DRESSINGS) ×2 IMPLANT

## 2017-09-16 NOTE — Transfer of Care (Signed)
Immediate Anesthesia Transfer of Care Note  Patient: ISEBELLA UPSHUR  Procedure(s) Performed: RIGHT UNICOMPARTMENTAL KNEE (Right )  Patient Location: PACU  Anesthesia Type:Spinal and MAC combined with regional for post-op pain  Level of Consciousness: awake, alert  and oriented  Airway & Oxygen Therapy: Patient Spontanous Breathing  Post-op Assessment: Report given to RN and Post -op Vital signs reviewed and stable  Post vital signs: Reviewed and stable  Last Vitals:  Vitals:   09/16/17 1225 09/16/17 1648  BP: (!) 161/67 (!) 122/49  Pulse: 80 73  Resp: 20 19  Temp: 36.6 C (!) 36.3 C  SpO2: 98% 96%    Last Pain:  Vitals:   09/16/17 1225  TempSrc: Oral      Patients Stated Pain Goal: 3 (59/16/38 4665)  Complications: No apparent anesthesia complications

## 2017-09-16 NOTE — Anesthesia Preprocedure Evaluation (Addendum)
Anesthesia Evaluation  Patient identified by MRN, date of birth, ID band Patient awake    Reviewed: Allergy & Precautions, NPO status , Patient's Chart, lab work & pertinent test results  Airway Mallampati: II   Neck ROM: Full    Dental  (+) Edentulous Upper, Teeth Intact   Pulmonary neg pulmonary ROS, former smoker,    breath sounds clear to auscultation       Cardiovascular negative cardio ROS   Rhythm:Regular Rate:Normal     Neuro/Psych negative neurological ROS  negative psych ROS   GI/Hepatic negative GI ROS, Neg liver ROS,   Endo/Other  negative endocrine ROS  Renal/GU negative Renal ROS  negative genitourinary   Musculoskeletal  (+) Arthritis ,   Abdominal   Peds negative pediatric ROS (+)  Hematology negative hematology ROS (+)   Anesthesia Other Findings   Reproductive/Obstetrics negative OB ROS                            Anesthesia Physical Anesthesia Plan  ASA: II  Anesthesia Plan: Spinal   Post-op Pain Management:  Regional for Post-op pain   Induction:   PONV Risk Score and Plan: 2 and Treatment may vary due to age or medical condition  Airway Management Planned: Natural Airway and Simple Face Mask  Additional Equipment:   Intra-op Plan:   Post-operative Plan:   Informed Consent: I have reviewed the patients History and Physical, chart, labs and discussed the procedure including the risks, benefits and alternatives for the proposed anesthesia with the patient or authorized representative who has indicated his/her understanding and acceptance.     Plan Discussed with: CRNA  Anesthesia Plan Comments:        Anesthesia Quick Evaluation

## 2017-09-16 NOTE — Op Note (Signed)
NAMEBELKYS, HENAULT                ACCOUNT NO.:  [id number]  MEDICAL RECORD NO.:  [id number]  LOCATION:  5N14C                        FACILITY:  [facility]  PHYSICIAN:  Lind Guest. Ninfa Linden, M.D.DATE OF BIRTH:  05-08-1941  DATE OF PROCEDURE:  09/16/2017 DATE OF DISCHARGE:                              OPERATIVE REPORT   PREOPERATIVE DIAGNOSIS:  Right knee medial compartment osteoarthritis and degenerative joint disease.  POSTOPERATIVE DIAGNOSIS:  Right knee medial compartment osteoarthritis and degenerative joint disease.  PROCEDURE:  Right partial/unicompartmental knee arthroplasty.  IMPLANTS:  Oxford unicompartmental knee system with size small femur, size B right tibial tray, size 3 mobile bearing polyethylene insert.  SURGEON:  Lind Guest. Ninfa Linden, M.D.  ASSISTANT:  Erskine Emery, PA-C.  ANESTHESIA: 1. Right lower extremity adductor canal block. 2. Spinal.  TOURNIQUET TIME:  Under 1 hour.  ANTIBIOTICS:  IV Ancef 2 g.  BLOOD LOSS:  Minimal.  COMPLICATIONS:  None.  INDICATIONS:  Ms. Ratledge is a 76 year old, active female well known to me.  We performed multiple interventions on her right knee including arthroscopic intervention for a medial meniscal tear.  She has had multiple steroid and hyaluronic acid injections in her knee.  It is only medial joint line tenderness that she has, and after trying and failing all forms of conservative treatment, we recommended a partial knee arthroplasty.  We had a long and thorough discussion about this including the risks and benefits of surgery.  She did wish to proceed given her continued pain, decreased mobility, and decreased quality of life.  She understands the risk of acute blood loss anemia, nerve and vessel injury, fracture, infection, and DVT.  She understands our goals are to decrease pain, improve mobility, and overall improved quality of life.  PROCEDURE DESCRIPTION:  After informed consent was obtained,  appropriate right knee was marked.  Anesthesia obtained an adductor canal block. She was then brought to the operating room and sat up on the operating table, spinal anesthesia was obtained, she was then laid in a supine position.  Her left nonoperative leg was placed in a well-padded well leg holder.  The right leg was prepped and draped with DuraPrep and sterile drapes including sterile stockinette after a nonsterile tourniquet was placed around her upper right thigh.  A well leg holder was in that leg as well proximal to the popliteal fossa.  Time-out was called and she was identified as correct patient and correct right knee. We then used an Esmarch to wrap out the leg and tourniquet was inflated to 300 mm of pressure.  We then made a medial incision just medial to patella and carried this down to patellar tendon.  We dissected down to the knee joint and carried out a medial parapatellar arthrotomy finding a large joint effusion.  We did find cartilage changes on the medial compartment of her knee only.  The patellofemoral joint looked good as well as the lateral compartment.  The ACL and PCL were also intact.  We removed remnants of the medial meniscus and then set our extramedullary cutting guide, based off using a size small femur with the G-clamp set at a 3, we corrected varus and valgus.  We  made our tibial cut and made a minimal tibial cut with reciprocating saw and the oscillating saw.  We felt good about that cut with a wedge of bone that we took.  We then went to the femur and used an intramedullary entry point into the notch to set our alignment of the femur going down the mechanical axis of the femur.  We made this for a size 4 cut.  We made our distal femur cut without difficulty and we were then pleased with milling with a size 0 femur placing the femur in and then choosing a size 3 insert with the size B tibia tray and a size small femur.  The 3 mm polyethylene  insert had a nice tightness with flexion, it was very tight in extension, so we decided to take 3 more millimeters off the femoral cut with a milling down to size 3.  Once we did this, we were pleased with our flexion and extension balancing with the size 3 poly insert.  We then did our finishing tibial preparation as well as our femoral preparation making our keel cut with an oscillating saw for the tibia with the trial tibia in place, size B right with a spot small femur and a 3 mm mobile bearing polyethylene insert.  We were pleased with our range of motion and stability.  We then removed all trial instrumentation and mixed our cement.  We then cemented the real Oxford partial knee system, size B right tibial tray, followed by the size small femur, we removed debris from the knee and then pressurized our knee with a 3 mm insert.  Once the cement had hardened, we removed cement debris from the knee and irrigated the knee with normal saline solution.  We then placed our real mobile bearing 3 polyethylene insert for right knee.  We were pleased again with range of motion.  We then irrigated the knee with normal saline solution 1 more time.  We closed the arthrotomy with interrupted #1 Vicryl suture, followed by 0 Vicryl in the deep tissue, 2-0 Vicryl in the subcutaneous tissue, 4-0 Monocryl subcuticular stitch, and Steri- Strips on the skin.  Well-padded sterile dressing was applied.  She was taken to the recovery room in stable condition.  All final counts were correct.  There were no complications noted.     Lind Guest. Ninfa Linden, M.D.     CYB/MEDQ  D:  09/16/2017  T:  09/16/2017  Job:  [id number]

## 2017-09-16 NOTE — Brief Op Note (Signed)
09/16/2017  4:33 PM  PATIENT:  [name]  76 y.o. female  PRE-OPERATIVE DIAGNOSIS:  Right Knee Medial Compartment Osteoarthritis  POST-OPERATIVE DIAGNOSIS:  Right Knee Medial Compartment Osteoarthritis  PROCEDURE:  Procedure(s): RIGHT UNICOMPARTMENTAL KNEE (Right)  SURGEON:  Surgeon(s) and Role:    Mcarthur Rossetti, MD - Primary  PHYSICIAN ASSISTANT: Benita Stabile, PA-C  ANESTHESIA:   regional and spinal  COUNTS:  YES  TOURNIQUET:  * Missing tourniquet times found for documented tourniquets in log: [id number] *  DICTATION: .Other Dictation: Dictation Number [id number]  PLAN OF CARE: Admit to inpatient   PATIENT DISPOSITION:  PACU - hemodynamically stable.   Delay start of Pharmacological VTE agent (>24hrs) due to surgical blood loss or risk of bleeding: no

## 2017-09-16 NOTE — Anesthesia Postprocedure Evaluation (Signed)
Anesthesia Post Note  Patient: [name]  Procedure(s) Performed: RIGHT UNICOMPARTMENTAL KNEE (Right )     Patient location during evaluation: PACU Anesthesia Type: Spinal Level of consciousness: oriented and awake and alert Pain management: pain level controlled Vital Signs Assessment: post-procedure vital signs reviewed and stable Respiratory status: spontaneous breathing, respiratory function stable and patient connected to nasal cannula oxygen Cardiovascular status: blood pressure returned to baseline and stable Postop Assessment: no headache, no backache, no apparent nausea or vomiting, spinal receding and patient able to bend at knees Anesthetic complications: no    Last Vitals:  Vitals:   09/16/17 1718 09/16/17 1726  BP: (!) 147/65 100/88  Pulse: 72 80  Resp: 12 17  Temp:  36.4 C  SpO2: 97% 95%    Last Pain:  Vitals:   09/16/17 1742  TempSrc:   PainSc: 0-No pain    LLE Motor Response: (P) Purposeful movement;Responds to commands (09/16/17 1745) LLE Sensation: (P) Decreased (09/16/17 1745) RLE Motor Response: (P) Purposeful movement;Responds to commands (09/16/17 1745) RLE Sensation: (P) Decreased (09/16/17 1745)      [name]

## 2017-09-16 NOTE — Anesthesia Procedure Notes (Addendum)
Anesthesia Regional Block: Adductor canal block   Pre-Anesthetic Checklist: ,, timeout performed, Correct Patient, Correct Site, Correct Laterality, Correct Procedure, Correct Position, site marked, Risks and benefits discussed,  Surgical consent,  Pre-op evaluation,  At surgeon's request and post-op pain management  Laterality: Right and Lower  Prep: chloraprep       Needles:   Needle Type: Echogenic Stimulator Needle     Needle Length: 9cm  Needle Gauge: 21   Needle insertion depth: 5 cm   Additional Needles:   Procedures:,,,, ultrasound used (permanent image in chart),,,,  Narrative:  Start time: 09/16/2017 2:20 PM End time: 09/16/2017 2:35 PM Injection made incrementally with aspirations every 5 mL.  Performed by: Personally  Anesthesiologist: Rica Koyanagi, MD

## 2017-09-16 NOTE — Anesthesia Procedure Notes (Signed)
Spinal  Patient location during procedure: OR Start time: 09/16/2017 2:52 PM End time: 09/16/2017 3:00 PM Staffing Anesthesiologist: Rica Koyanagi, MD Performed: anesthesiologist  Preanesthetic Checklist Completed: patient identified, site marked, surgical consent, pre-op evaluation, timeout performed, IV checked, risks and benefits discussed and monitors and equipment checked Spinal Block Patient position: sitting Prep: ChloraPrep and site prepped and draped Patient monitoring: heart rate, cardiac monitor, continuous pulse ox and blood pressure Approach: right paramedian Location: L3-4 Injection technique: single-shot Needle Needle type: Quincke  Needle gauge: 25 G Assessment Sensory level: T6

## 2017-09-16 NOTE — H&P (Signed)
TOTAL KNEE ADMISSION H&P  Patient is being admitted for right partial knee arthroplasty.  Subjective:  Chief Complaint:right knee pain.  HPI: [name], 76 y.o. female, has a history of pain and functional disability in the right knee due to arthritis and has failed non-surgical conservative treatments for greater than 12 weeks to includeNSAID's and/or analgesics, corticosteriod injections, viscosupplementation injections, flexibility and strengthening excercises and activity modification.  Onset of symptoms was gradual, starting 5 years ago with gradually worsening course since that time. The patient noted prior procedures on the knee to include  arthroscopy and menisectomy on the right knee(s).  Patient currently rates pain in the right knee(s) at 10 out of 10 with activity. Patient has night pain, worsening of pain with activity and weight bearing, pain that interferes with activities of daily living, pain with passive range of motion, crepitus and joint swelling.  Patient has evidence of subchondral sclerosis, periarticular osteophytes and joint space narrowing by imaging studies. There is no active infection.  Patient Active Problem List   Diagnosis Date Noted  . Status post right partial knee replacement 09/16/2017  . Acute left-sided low back pain without sciatica 07/28/2017  . 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 Degenerative arthritis of knee, bilateral 07/11/2016  . 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 Allergic rhinitis 12/22/2011   Past Medical History:  Diagnosis Date  . Arthritis   . Cancer ([place])   . History of chicken pox   . History of colon polyps   . History of mouth cancer   . Osteoarthritis of knee    Medial compartment, Right    Past Surgical History:  Procedure Laterality Date  . APPENDECTOMY    . DILATION AND CURETTAGE OF UTERUS    . KNEE ARTHROSCOPY W/ MENISCAL REPAIR Left   . TUBAL LIGATION      Current Facility-Administered Medications   Medication Dose Route Frequency Provider Last Rate Last Dose  . ceFAZolin (ANCEF) IVPB 2g/100 mL premix  2 g Intravenous To SS-Surg Mcarthur Rossetti, MD      . chlorhexidine (HIBICLENS) 4 % liquid 4 application  60 mL Topical Once Erskine Emery W, PA-C      . fentaNYL (SUBLIMAZE) 100 MCG/2ML injection           . lactated ringers infusion   Intravenous Continuous Ellender, Karyl Kinnier, MD 10 mL/hr at 09/16/17 1255    . midazolam (VERSED) 2 MG/2ML injection            No Known Allergies  Social History   Tobacco Use  . Smoking status: Former Smoker    Last attempt to quit: 12/28/1986    Years since quitting: 30.7  . Smokeless tobacco: Never Used  Substance Use Topics  . Alcohol use: No    History reviewed. No pertinent family history.   Review of Systems  Musculoskeletal: Positive for joint pain.  All other systems reviewed and are negative.   Objective:  Physical Exam  Constitutional: She is oriented to person, place, and time. She appears well-developed and well-nourished.  HENT:  Head: Normocephalic and atraumatic.  Eyes: EOM are normal. Pupils are equal, round, and reactive to light.  Neck: Normal range of motion. Neck supple.  Cardiovascular: Normal rate and regular rhythm.  Respiratory: Effort normal and breath sounds normal.  GI: Soft. Bowel sounds are normal.  Musculoskeletal:       Right knee: She exhibits no effusion. Tenderness found. Medial joint line tenderness noted.  Neurological: She  is alert and oriented to person, place, and time.  Skin: Skin is warm and dry.  Psychiatric: She has a normal mood and affect.    Vital signs in last 24 hours: Temp:  [97.8 F (36.6 C)] 97.8 F (36.6 C) (12/11 1225) Pulse Rate:  [80] 80 (12/11 1225) Resp:  [20] 20 (12/11 1225) BP: (161)/(67) 161/67 (12/11 1225) SpO2:  [98 %] 98 % (12/11 1225) Weight:  [165 lb (74.8 kg)] 165 lb (74.8 kg) (12/11 1225)  Labs:   Estimated body mass index is 30.18 kg/m as calculated  from the following:   Height as of this encounter: 5\' 2"  (1.575 m).   Weight as of this encounter: 165 lb (74.8 kg).   Imaging Review Plain radiographs demonstrate severe degenerative joint disease of the right knee medial compartment. The overall alignment isneutral. The bone quality appears to be excellent for age and reported activity level.  Assessment/Plan:  End stage arthritis medial compartment, right knee   The patient history, physical examination, clinical judgment of the provider and imaging studies are consistent with end stage degenerative joint disease of the right knee(s) and partial knee arthroplasty is deemed medically necessary. The treatment options including medical management, injection therapy arthroscopy and arthroplasty were discussed at length. The risks and benefits of partial knee arthroplasty were presented and reviewed. The risks due to aseptic loosening, infection, stiffness, patella tracking problems, thromboembolic complications and other imponderables were discussed. The patient acknowledged the explanation, agreed to proceed with the plan and consent was signed. Patient is being admitted for inpatient treatment for surgery, pain control, PT, OT, prophylactic antibiotics, VTE prophylaxis, progressive ambulation and ADL's and discharge planning. The patient is planning to be discharged home with home health services

## 2017-09-16 NOTE — Anesthesia Procedure Notes (Signed)
Procedure Name: MAC Performed by: Marleah Beever B, CRNA Pre-anesthesia Checklist: Patient identified, Emergency Drugs available, Suction available, Patient being monitored and Timeout performed Patient Re-evaluated:Patient Re-evaluated prior to induction Oxygen Delivery Method: Simple face mask Preoxygenation: Pre-oxygenation with 100% oxygen Induction Type: IV induction Placement Confirmation: positive ETCO2 Dental Injury: Teeth and Oropharynx as per pre-operative assessment        

## 2017-09-16 NOTE — Evaluation (Signed)
Physical Therapy Evaluation Patient Details Name: [name] MRN: [id number] DOB: 1941-05-20 Today's Date: 09/16/2017   History of Present Illness  Pt is a 76 y/o female s/p elective R partial knee replacement. PMH includes oral cancer, bilateral carpal tunnel, and L knee arthroscopy.   Clinical Impression  Pt is s/p surgery above with deficits below. PTA, pt was independent with functional mobility. Upon eval, pt presenting with post op pain and weakness. Required min guard assist for mobility with RW. Reports husband will be able to assist upon return home and will need DME below. Follow up recommendations per MD arrangements. Will continue to follow acutely to maximize functional mobility independence and safety.     Follow Up Recommendations DC plan and follow up therapy as arranged by surgeon;Supervision for mobility/OOB    Equipment Recommendations  3in1 (PT)    Recommendations for Other Services       Precautions / Restrictions Precautions Precautions: Knee Precaution Booklet Issued: Yes (comment) Precaution Comments: Reviewed supine ther ex with pt.  Restrictions Weight Bearing Restrictions: Yes RLE Weight Bearing: Weight bearing as tolerated      Mobility  Bed Mobility Overal bed mobility: Needs Assistance Bed Mobility: Supine to Sit;Sit to Supine     Supine to sit: Supervision Sit to supine: Supervision   General bed mobility comments: Supervision for safety   Transfers Overall transfer level: Needs assistance Equipment used: Rolling walker (2 wheeled) Transfers: Sit to/from Stand Sit to Stand: Min guard         General transfer comment: Min guard for safety   Ambulation/Gait Ambulation/Gait assistance: Min guard Ambulation Distance (Feet): 50 Feet Assistive device: Rolling walker (2 wheeled) Gait Pattern/deviations: Step-to pattern;Decreased step length - right;Decreased step length - left;Decreased weight shift to right;Antalgic Gait velocity:  Decreased Gait velocity interpretation: Below normal speed for age/gender General Gait Details: Slow, antalgic gait secondary to post op pain. Verbal cues for sequencing using RW.   Stairs            Wheelchair Mobility    Modified Rankin (Stroke Patients Only)       Balance Overall balance assessment: Needs assistance Sitting-balance support: No upper extremity supported;Feet supported Sitting balance-Leahy Scale: Good     Standing balance support: Bilateral upper extremity supported;During functional activity Standing balance-Leahy Scale: Poor Standing balance comment: Reliant on UE support                              Pertinent Vitals/Pain Pain Assessment: 0-10 Pain Score: 3  Pain Location: R knee  Pain Descriptors / Indicators: Aching;Operative site guarding Pain Intervention(s): Limited activity within patient's tolerance;Monitored during session;Repositioned    Home Living Family/patient expects to be discharged to:: Private residence Living Arrangements: Spouse/significant other Available Help at Discharge: Family;Available 24 hours/day Type of Home: House Home Access: Stairs to enter Entrance Stairs-Rails: None Entrance Stairs-Number of Steps: 2 Home Layout: One level Home Equipment: Environmental consultant - 2 wheels      Prior Function Level of Independence: Independent               Hand Dominance   Dominant Hand: Right    Extremity/Trunk Assessment   Upper Extremity Assessment Upper Extremity Assessment: Defer to OT evaluation    Lower Extremity Assessment Lower Extremity Assessment: RLE deficits/detail RLE Deficits / Details: Sensory in tact. Deficits consistent with post op pain and weakness. Able to perform ther ex below.     Cervical /  Trunk Assessment Cervical / Trunk Assessment: Normal  Communication   Communication: No difficulties  Cognition Arousal/Alertness: Awake/alert Behavior During Therapy: WFL for tasks  assessed/performed Overall Cognitive Status: Within Functional Limits for tasks assessed                                        General Comments General comments (skin integrity, edema, etc.): Pt's husband present during session.     Exercises Total Joint Exercises Ankle Circles/Pumps: AROM;Both;20 reps Quad Sets: AROM;Right;10 reps Towel Squeeze: AROM;Both;10 reps Heel Slides: AROM;Right;10 reps Hip ABduction/ADduction: AROM;Right;10 reps   Assessment/Plan    PT Assessment Patient needs continued PT services  PT Problem List Decreased strength;Decreased balance;Decreased mobility;Decreased knowledge of use of DME;Decreased knowledge of precautions;Pain       PT Treatment Interventions DME instruction;Gait training;Functional mobility training;Therapeutic activities;Stair training;Therapeutic exercise;Balance training;Neuromuscular re-education;Patient/family education    PT Goals (Current goals can be found in the Care Plan section)  Acute Rehab PT Goals Patient Stated Goal: to go home  PT Goal Formulation: With patient Time For Goal Achievement: 09/23/17 Potential to Achieve Goals: Good    Frequency 7X/week   Barriers to discharge        Co-evaluation               AM-PAC PT "6 Clicks" Daily Activity  Outcome Measure Difficulty turning over in bed (including adjusting bedclothes, sheets and blankets)?: A Little Difficulty moving from lying on back to sitting on the side of the bed? : A Little Difficulty sitting down on and standing up from a chair with arms (e.g., wheelchair, bedside commode, etc,.)?: Unable Help needed moving to and from a bed to chair (including a wheelchair)?: A Little Help needed walking in hospital room?: A Little Help needed climbing 3-5 steps with a railing? : A Little 6 Click Score: 16    End of Session Equipment Utilized During Treatment: Gait belt Activity Tolerance: Patient tolerated treatment well Patient left:  in bed;with call bell/phone within reach;with family/visitor present Nurse Communication: Mobility status PT Visit Diagnosis: Other abnormalities of gait and mobility (R26.89);Pain Pain - Right/Left: Right Pain - part of body: Knee    Time: 1851-1918 PT Time Calculation (min) (ACUTE ONLY): 27 min   Charges:   PT Evaluation $PT Eval Low Complexity: 1 Low PT Treatments $Gait Training: 8-22 mins   PT G Codes:   PT G-Codes **NOT FOR INPATIENT CLASS** Functional Assessment Tool Used: AM-PAC 6 Clicks Basic Mobility;Clinical judgement Functional Limitation: Mobility: Walking and moving around Mobility: Walking and Moving Around Current Status ([id number]): At least 40 percent but less than 60 percent impaired, limited or restricted Mobility: Walking and Moving Around Goal Status [phone]): At least 1 percent but less than 20 percent impaired, limited or restricted    Leighton Ruff, PT, DPT  Acute Rehabilitation Services  Pager: [phone]   [name] 09/16/2017, 7:21 PM

## 2017-09-17 ENCOUNTER — Encounter (HOSPITAL_COMMUNITY): Payer: Self-pay | Admitting: Orthopaedic Surgery

## 2017-09-17 ENCOUNTER — Other Ambulatory Visit (INDEPENDENT_AMBULATORY_CARE_PROVIDER_SITE_OTHER): Payer: Self-pay

## 2017-09-17 DIAGNOSIS — S8990XA Unspecified injury of unspecified lower leg, initial encounter: Secondary | ICD-10-CM | POA: Diagnosis not present

## 2017-09-17 DIAGNOSIS — R269 Unspecified abnormalities of gait and mobility: Secondary | ICD-10-CM | POA: Diagnosis not present

## 2017-09-17 DIAGNOSIS — M1711 Unilateral primary osteoarthritis, right knee: Secondary | ICD-10-CM | POA: Diagnosis not present

## 2017-09-17 MED ORDER — ASPIRIN 325 MG PO TBEC: 325.0000 mg | DELAYED_RELEASE_TABLET | Freq: Two times a day (BID) | ORAL | 0 refills | Status: DC

## 2017-09-17 MED ORDER — METHOCARBAMOL 500 MG PO TABS: 500.0000 mg | ORAL_TABLET | Freq: Four times a day (QID) | ORAL | 0 refills | Status: DC | PRN

## 2017-09-17 MED ORDER — OXYCODONE-ACETAMINOPHEN 5-325 MG PO TABS: 1.0000 | ORAL_TABLET | ORAL | 0 refills | Status: DC | PRN

## 2017-09-17 NOTE — Progress Notes (Signed)
Physical Therapy Treatment Patient Details Name: [name] MRN: [id number] DOB: 1941-01-26 Today's Date: 09/17/2017    History of Present Illness Pt is a 76 y/o female s/p elective R partial knee replacement. PMH includes oral cancer, bilateral carpal tunnel, and L knee arthroscopy.     PT Comments    Patient is making good progress with PT.  From a mobility standpoint anticipate patient will be ready for DC home when medically ready.    Follow Up Recommendations  DC plan and follow up therapy as arranged by surgeon;Supervision for mobility/OOB     Equipment Recommendations  3in1 (PT)    Recommendations for Other Services       Precautions / Restrictions Precautions Precautions: Knee Precaution Booklet Issued: Yes (comment) Precaution Comments: positioning/precautions reviewed with pt Restrictions Weight Bearing Restrictions: Yes RLE Weight Bearing: Weight bearing as tolerated    Mobility  Bed Mobility Overal bed mobility: Modified Independent             General bed mobility comments: OOB in recliner upon arrival   Transfers Overall transfer level: Needs assistance Equipment used: Rolling walker (2 wheeled) Transfers: Sit to/from Stand Sit to Stand: Supervision         General transfer comment: cues for safe hand placement  Ambulation/Gait Ambulation/Gait assistance: Supervision Ambulation Distance (Feet): 150 Feet Assistive device: Rolling walker (2 wheeled) Gait Pattern/deviations: Decreased weight shift to right;Antalgic;Step-through pattern;Decreased stride length Gait velocity: Decreased   General Gait Details: cues for step length symmetry and posture   Stairs            Wheelchair Mobility    Modified Rankin (Stroke Patients Only)       Balance Overall balance assessment: Needs assistance Sitting-balance support: No upper extremity supported;Feet supported Sitting balance-Leahy Scale: Good     Standing balance support:  During functional activity;Single extremity supported Standing balance-Leahy Scale: Poor Standing balance comment: Reliant on UE support                             Cognition Arousal/Alertness: Awake/alert Behavior During Therapy: WFL for tasks assessed/performed Overall Cognitive Status: Within Functional Limits for tasks assessed                                        Exercises Total Joint Exercises Short Arc Quad: AROM;Right;10 reps Hip ABduction/ADduction: AROM;Right;10 reps Long Arc Quad: AROM;Right;10 reps Knee Flexion: AROM;Right;5 reps;Seated;Other (comment)(10 sec holds)    General Comments        Pertinent Vitals/Pain Pain Assessment: Faces Faces Pain Scale: Hurts a little bit Pain Location: R knee  Pain Descriptors / Indicators: Sore Pain Intervention(s): Monitored during session;Repositioned    Home Living Family/patient expects to be discharged to:: Private residence Living Arrangements: Spouse/significant other Available Help at Discharge: Family;Available 24 hours/day Type of Home: House Home Access: Stairs to enter Entrance Stairs-Rails: None Home Layout: One level Home Equipment: Environmental consultant - 2 wheels      Prior Function Level of Independence: Independent          PT Goals (current goals can now be found in the care plan section) Acute Rehab PT Goals Patient Stated Goal: to go home  PT Goal Formulation: With patient Time For Goal Achievement: 09/23/17 Potential to Achieve Goals: Good Progress towards PT goals: Progressing toward goals    Frequency  7X/week      PT Plan Current plan remains appropriate    Co-evaluation              AM-PAC PT "6 Clicks" Daily Activity  Outcome Measure  Difficulty turning over in bed (including adjusting bedclothes, sheets and blankets)?: None Difficulty moving from lying on back to sitting on the side of the bed? : None Difficulty sitting down on and standing up from a  chair with arms (e.g., wheelchair, bedside commode, etc,.)?: A Lot Help needed moving to and from a bed to chair (including a wheelchair)?: A Little Help needed walking in hospital room?: A Little Help needed climbing 3-5 steps with a railing? : A Little 6 Click Score: 19    End of Session Equipment Utilized During Treatment: Gait belt Activity Tolerance: Patient tolerated treatment well Patient left: with call bell/phone within reach;with family/visitor present;in bed Nurse Communication: Mobility status PT Visit Diagnosis: Other abnormalities of gait and mobility (R26.89);Pain Pain - Right/Left: Right Pain - part of body: Knee     Time: 0240-9735 PT Time Calculation (min) (ACUTE ONLY): 27 min  Charges:  $Gait Training: 8-22 mins $Therapeutic Exercise: 8-22 mins                    G Codes:       Earney Navy, PTA Pager: [phone]     Darliss Cheney 09/17/2017, 2:26 PM

## 2017-09-17 NOTE — Discharge Summary (Signed)
Patient ID: [name] MRN: [id number] DOB/AGE: 05/24/1941 76 y.o.  Admit date: 09/16/2017 Discharge date: 09/17/2017  Admission Diagnoses:  Principal Problem:   Status post right partial knee replacement   Discharge Diagnoses:  Same  Past Medical History:  Diagnosis Date  . Arthritis   . Cancer ([place])   . History of chicken pox   . History of colon polyps   . History of mouth cancer   . Osteoarthritis of knee    Medial compartment, Right    Surgeries: Procedure(s): RIGHT UNICOMPARTMENTAL KNEE on 09/16/2017   Consultants:   Discharged Condition: Improved  Hospital Course: TOBIN CADIENTE is an 76 y.o. female who was admitted 09/16/2017 for operative treatment ofStatus post right partial knee replacement. Patient has severe unremitting pain that affects sleep, daily activities, and work/hobbies. After pre-op clearance the patient was taken to the operating room on 09/16/2017 and underwent  Procedure(s): RIGHT UNICOMPARTMENTAL KNEE.    Patient was given perioperative antibiotics:  Anti-infectives (From admission, onward)   Start     Dose/Rate Route Frequency Ordered Stop   09/16/17 2100  ceFAZolin (ANCEF) IVPB 1 g/50 mL premix     1 g 100 mL/hr over 30 Minutes Intravenous Every 6 hours 09/16/17 1740 09/17/17 0339   09/16/17 1400  ceFAZolin (ANCEF) IVPB 2g/100 mL premix     2 g 200 mL/hr over 30 Minutes Intravenous To ShortStay Surgical 09/15/17 1322 09/16/17 1508       Patient was given sequential compression devices, early ambulation, and chemoprophylaxis to prevent DVT.  Patient benefited maximally from hospital stay and there were no complications.    Recent vital signs:  Patient Vitals for the past 24 hrs:  BP Temp Temp src Pulse Resp SpO2 Height Weight  09/17/17 0700 140/70 98.8 F (37.1 C) Oral 93 16 97 % - -  09/17/17 0221 (!) 130/50 98 F (36.7 C) Oral 91 17 95 % - -  09/16/17 2120 139/68 98.6 F (37 C) Oral 92 17 93 % - -  09/16/17 1726 100/88  97.6 F (36.4 C) - 80 17 95 % - -  09/16/17 1718 (!) 147/65 - - 72 12 97 % - -  09/16/17 1703 (!) 137/56 - - 75 20 98 % - -  09/16/17 1648 (!) 122/49 (!) 97.4 F (36.3 C) - 73 19 96 % - -  09/16/17 1252 - - - - - - 5\' 2"  (1.575 m) -  09/16/17 1225 (!) 161/67 97.8 F (36.6 C) Oral 80 20 98 % - 165 lb (74.8 kg)     Recent laboratory studies: No results for input(s): WBC, HGB, HCT, PLT, NA, K, CL, CO2, BUN, CREATININE, GLUCOSE, INR, CALCIUM in the last 72 hours.  Invalid input(s): PT, 2   Discharge Medications:   Allergies as of 09/17/2017   No Known Allergies     Medication List    STOP taking these medications   aspirin 81 MG tablet Replaced by:  aspirin 325 MG EC tablet     TAKE these medications   aspirin 325 MG EC tablet Take 1 tablet (325 mg total) by mouth 2 (two) times daily after a meal. Replaces:  aspirin 81 MG tablet   CALCIUM 600/VITAMIN D3 600-800 MG-UNIT Tabs Generic drug:  Calcium Carb-Cholecalciferol Take 1 tablet by mouth 2 (two) times daily.   diclofenac 75 MG EC tablet Commonly known as:  VOLTAREN Take 1 tablet (75 mg total) by mouth 2 (two) times daily between meals as needed.  fluticasone 50 MCG/ACT nasal spray Commonly known as:  FLONASE Place 2 sprays into the nose at bedtime.   GLUCOSAMINE 1500 COMPLEX Caps Take 1 capsule by mouth 2 (two) times daily.   methocarbamol 500 MG tablet Commonly known as:  ROBAXIN Take 1 tablet (500 mg total) by mouth every 6 (six) hours as needed for muscle spasms.   multivitamin with minerals tablet Take 1 tablet by mouth daily.   oxyCODONE-acetaminophen 5-325 MG tablet Commonly known as:  ROXICET Take 1-2 tablets by mouth every 4 (four) hours as needed.   tolterodine 2 MG tablet Commonly known as:  DETROL Take 2 mg by mouth 2 (two) times daily.   Turmeric 500 MG Caps Take 1 capsule by mouth daily.            Durable Medical Equipment  (From admission, onward)        Start     Ordered    09/16/17 1741  DME 3 n 1  Once     09/16/17 1740   09/16/17 1741  DME Walker rolling  Once    Question:  Patient needs a walker to treat with the following condition  Answer:  Status post right partial knee replacement   09/16/17 1740      Diagnostic Studies: Dg Knee Right Port  Result Date: 09/16/2017 CLINICAL DATA:  76 year old female post partial right knee replacement. Initial encounter. EXAM: PORTABLE RIGHT KNEE - 1-2 VIEW COMPARISON:  11/15/2015 MR. FINDINGS: Post right medial tibiofemoral hemiarthroplasty which appears in satisfactory position. Postsurgical changes. IMPRESSION: Post right medial tibiofemoral hemiarthroplasty. Electronically Signed   By: Genia Del M.D.   On: 09/16/2017 17:08    Disposition: 01-Home or Self Care  Discharge Instructions    Discharge patient   Complete by:  As directed    Do not discharge until after her afternoon therapy session.   Discharge disposition:  01-Home or Self Care   Discharge patient date:  09/17/2017      Follow-up Information    Mcarthur Rossetti, MD Follow up in 2 week(s).   Specialty:  Orthopedic Surgery Contact information: [place] [address] [phone]            Signed: Mcarthur Rossetti 09/17/2017, 7:29 AM

## 2017-09-17 NOTE — Care Management Note (Signed)
Case Management Note  Patient Details  Name: [name] MRN: [id number] Date of Birth: [birth year]  Subjective/Objective:   76 yr old female  S/p right unicompartmental knee arthroplasty.                Action/Plan: Patient was preoperatively setup with [facility], no changes. Will have family support at discharge.   Expected Discharge Date:  09/17/17               Expected Discharge Plan:  [place]  In-House Referral:     Discharge planning Services  CM Consult  Post Acute Care Choice:  Durable Medical Equipment, Home Health Choice offered to:  Patient  DME Arranged:  3-N-1, Walker rolling DME Agency:  [place]:  PT [place]:  [facility] ([facility])  Status of Service:  Completed, signed off  If discussed at [employer], dates discussed:    Additional Comments:  Ninfa Meeker, RN 09/17/2017, 2:51 PM

## 2017-09-17 NOTE — Discharge Instructions (Signed)

## 2017-09-17 NOTE — Progress Notes (Signed)
Physical Therapy Treatment Patient Details Name: [name] MRN: [id number] DOB: [birth year] Today's Date: 09/17/2017    History of Present Illness Pt is a 76 y/o female s/p elective R partial knee replacement. PMH includes oral cancer, bilateral carpal tunnel, and L knee arthroscopy.     PT Comments    Patient is progressing well toward mobility goals. Pt tolerated increased gait distance and stair training well. Husband assisted with ascend/descend of stairs. HEP reviewed. Current plan remains appropriate.    Follow Up Recommendations  DC plan and follow up therapy as arranged by surgeon;Supervision for mobility/OOB     Equipment Recommendations  3in1 (PT)    Recommendations for Other Services       Precautions / Restrictions Precautions Precautions: Knee Precaution Booklet Issued: Yes (comment) Precaution Comments: positioning/precautions reviewed with pt Restrictions Weight Bearing Restrictions: Yes RLE Weight Bearing: Weight bearing as tolerated    Mobility  Bed Mobility Overal bed mobility: Modified Independent Bed Mobility: Supine to Sit              Transfers Overall transfer level: Needs assistance Equipment used: Rolling walker (2 wheeled) Transfers: Sit to/from Stand Sit to Stand: Supervision         General transfer comment: supervision for safety  Ambulation/Gait Ambulation/Gait assistance: Supervision Ambulation Distance (Feet): 120 Feet Assistive device: Rolling walker (2 wheeled) Gait Pattern/deviations: Decreased weight shift to right;Antalgic;Step-through pattern;Decreased stride length Gait velocity: Decreased   General Gait Details: cues for sequencing and posture; improved step length symmetry and weightbearing with increased distance   Stairs Stairs: Yes   Stair Management: No rails;Step to pattern;Backwards;With walker Number of Stairs: 3 General stair comments: cues for sequencing and technique; husband stabilized  RW  Wheelchair Mobility    Modified Rankin (Stroke Patients Only)       Balance Overall balance assessment: Needs assistance Sitting-balance support: No upper extremity supported;Feet supported Sitting balance-Leahy Scale: Good     Standing balance support: Bilateral upper extremity supported;During functional activity Standing balance-Leahy Scale: Poor Standing balance comment: Reliant on UE support                             Cognition Arousal/Alertness: Awake/alert Behavior During Therapy: WFL for tasks assessed/performed Overall Cognitive Status: Within Functional Limits for tasks assessed                                        Exercises Total Joint Exercises Ankle Circles/Pumps: AROM;Both;10 reps Quad Sets: AROM;Right;10 reps Heel Slides: AROM;Right;10 reps Hip ABduction/ADduction: AROM;Right;10 reps Goniometric ROM: 90 degrees flexion    General Comments        Pertinent Vitals/Pain Pain Assessment: Faces Faces Pain Scale: Hurts little more Pain Location: R knee  Pain Descriptors / Indicators: Aching;Sore Pain Intervention(s): Limited activity within patient's tolerance;Monitored during session;Premedicated before session;Repositioned;Ice applied    Home Living                      Prior Function            PT Goals (current goals can now be found in the care plan section) Acute Rehab PT Goals Patient Stated Goal: to go home  PT Goal Formulation: With patient Time For Goal Achievement: 09/23/17 Potential to Achieve Goals: Good Progress towards PT goals: Progressing toward goals    Frequency  7X/week      PT Plan Current plan remains appropriate    Co-evaluation              AM-PAC PT "6 Clicks" Daily Activity  Outcome Measure  Difficulty turning over in bed (including adjusting bedclothes, sheets and blankets)?: None Difficulty moving from lying on back to sitting on the side of the bed? :  None Difficulty sitting down on and standing up from a chair with arms (e.g., wheelchair, bedside commode, etc,.)?: A Lot Help needed moving to and from a bed to chair (including a wheelchair)?: A Little Help needed walking in hospital room?: A Little Help needed climbing 3-5 steps with a railing? : A Little 6 Click Score: 19    End of Session Equipment Utilized During Treatment: Gait belt Activity Tolerance: Patient tolerated treatment well Patient left: with call bell/phone within reach;with family/visitor present;in chair Nurse Communication: Mobility status PT Visit Diagnosis: Other abnormalities of gait and mobility (R26.89);Pain Pain - Right/Left: Right Pain - part of body: Knee     Time: 6010-9323 PT Time Calculation (min) (ACUTE ONLY): 42 min  Charges:  $Gait Training: 23-37 mins $Therapeutic Exercise: 8-22 mins                    G Codes:       Earney Navy, PTA Pager: [phone]     Darliss Cheney 09/17/2017, 10:01 AM

## 2017-09-17 NOTE — Evaluation (Signed)
Occupational Therapy Evaluation Patient Details Name: [name] MRN: [id number] DOB: [birth year] Today's Date: 09/17/2017    History of Present Illness Pt is a 76 y/o female s/p elective R partial knee replacement. PMH includes oral cancer, bilateral carpal tunnel, and L knee arthroscopy.    Clinical Impression   This 76 y/o F presents with the above. Pt lives with spouse, at baseline is independent with ADLs and functional mobility. Pt completed room level functional mobility, toilet and simulated shower transfer at RW level this session with MinGuard assist throughout. Currently requires MinA for LB ADLs secondary to RLE functional limitations. Pt's spouse will be available to provide supervision/assist for ADLs PRN after return home. Education provided and questions answered throughout session. Feel Pt is safe to return home with spouse assist. No further acute OT needs identified at this time. Will sign off.      Follow Up Recommendations  DC plan and follow up therapy as arranged by surgeon;Supervision/Assistance - 24 hour    Equipment Recommendations  3 in 1 bedside commode           Precautions / Restrictions Precautions Precautions: Knee Precaution Booklet Issued: Yes (comment) Precaution Comments: positioning/precautions reviewed with pt Restrictions Weight Bearing Restrictions: Yes RLE Weight Bearing: Weight bearing as tolerated      Mobility Bed Mobility Overal bed mobility: Modified Independent Bed Mobility: Supine to Sit           General bed mobility comments: OOB in recliner upon arrival   Transfers Overall transfer level: Needs assistance Equipment used: Rolling walker (2 wheeled) Transfers: Sit to/from Stand Sit to Stand: Min guard         General transfer comment: minguard for safety; verbal cues for hand placement     Balance Overall balance assessment: Needs assistance Sitting-balance support: No upper extremity supported;Feet  supported Sitting balance-Leahy Scale: Good     Standing balance support: Bilateral upper extremity supported;During functional activity Standing balance-Leahy Scale: Poor Standing balance comment: Reliant on UE support                            ADL either performed or assessed with clinical judgement   ADL Overall ADL's : Needs assistance/impaired Eating/Feeding: Set up;Sitting   Grooming: Min guard;Standing   Upper Body Bathing: Min guard;Sitting   Lower Body Bathing: Minimal assistance;Sit to/from stand   Upper Body Dressing : Min guard;Sitting   Lower Body Dressing: Minimal assistance;Sit to/from stand Lower Body Dressing Details (indicate cue type and reason): Pt able to reach and adjust sock on RLE, unable to fully doff/don; educated on compensatory technique for completing LB dressing  Toilet Transfer: Min guard;Ambulation;Regular Toilet;Grab bars;RW   Toileting- Water quality scientist and Hygiene: Min guard;Sit to/from stand   Tub/ Shower Transfer: Walk-in shower;Min guard;Ambulation;3 in 1;Rolling walker Tub/Shower Transfer Details (indicate cue type and reason): simulated in room; minGuard for safety with Pt demonstrating good carry over of education on transfer sequence Functional mobility during ADLs: Min guard;Rolling walker General ADL Comments: education provided on safety/compensatory techniques for completing ADLs/functional mobility transfers                         Pertinent Vitals/Pain Pain Assessment: Faces Faces Pain Scale: Hurts little more Pain Location: R knee  Pain Descriptors / Indicators: Aching;Sore;Guarding Pain Intervention(s): Limited activity within patient's tolerance;Monitored during session;Ice applied     Hand Dominance Right   Extremity/Trunk Assessment Upper  Extremity Assessment Upper Extremity Assessment: Overall WFL for tasks assessed   Lower Extremity Assessment Lower Extremity Assessment: Defer to PT  evaluation   Cervical / Trunk Assessment Cervical / Trunk Assessment: Normal   Communication Communication Communication: No difficulties   Cognition Arousal/Alertness: Awake/alert Behavior During Therapy: WFL for tasks assessed/performed Overall Cognitive Status: Within Functional Limits for tasks assessed                                                     Home Living Family/patient expects to be discharged to:: Private residence Living Arrangements: Spouse/significant other Available Help at Discharge: Family;Available 24 hours/day Type of Home: House Home Access: Stairs to enter [employer] of Steps: 2 Entrance Stairs-Rails: None Home Layout: One level     Bathroom Shower/Tub: Tub/shower unit;Walk-in shower   Bathroom Toilet: Handicapped height     Home Equipment: Environmental consultant - 2 wheels          Prior Functioning/Environment Level of Independence: Independent                 OT Problem List: Decreased strength;Decreased range of motion;Decreased activity tolerance;Decreased knowledge of use of DME or AE            OT Goals(Current goals can be found in the care plan section) Acute Rehab OT Goals Patient Stated Goal: to go home  OT Goal Formulation: All assessment and education complete, DC therapy                                 AM-PAC PT "6 Clicks" Daily Activity     Outcome Measure Help from another person eating meals?: None Help from another person taking care of personal grooming?: A Little Help from another person toileting, which includes using toliet, bedpan, or urinal?: A Little Help from another person bathing (including washing, rinsing, drying)?: A Little Help from another person to put on and taking off regular upper body clothing?: None Help from another person to put on and taking off regular lower body clothing?: A Little 6 Click Score: 20   End of Session Equipment Utilized During Treatment:  Gait belt;Rolling walker Nurse Communication: Mobility status  Activity Tolerance: Patient tolerated treatment well Patient left: in chair;with call bell/phone within reach;with family/visitor present  OT Visit Diagnosis: Other abnormalities of gait and mobility (R26.89);Unsteadiness on feet (R26.81)                Time: 2683-4196 OT Time Calculation (min): 20 min Charges:  OT General Charges $OT Visit: 1 Visit OT Evaluation $OT Eval Low Complexity: 1 Low G-Codes: OT G-codes **NOT FOR INPATIENT CLASS** Functional Assessment Tool Used: AM-PAC 6 Clicks Daily Activity;Clinical judgement Functional Limitation: Self care Self Care Current Status (Q2297): At least 1 percent but less than 20 percent impaired, limited or restricted Self Care Goal Status (L8921): At least 1 percent but less than 20 percent impaired, limited or restricted Self Care Discharge Status [phone]): At least 1 percent but less than 20 percent impaired, limited or restricted   Lou Cal, OT Pager [phone] 09/17/2017   Raymondo Band 09/17/2017, 11:03 AM

## 2017-09-17 NOTE — Progress Notes (Signed)
Subjective: 1 Day Post-Op Procedure(s) (LRB): RIGHT UNICOMPARTMENTAL KNEE (Right) Patient reports pain as moderate.   Tolerated surgery well.  Objective: Vital signs in last 24 hours: Temp:  [97.4 F (36.3 C)-98.8 F (37.1 C)] 98.8 F (37.1 C) (12/12 0700) Pulse Rate:  [72-93] 93 (12/12 0700) Resp:  [12-20] 16 (12/12 0700) BP: (100-161)/(49-88) 140/70 (12/12 0700) SpO2:  [93 %-98 %] 97 % (12/12 0700) Weight:  [165 lb (74.8 kg)] 165 lb (74.8 kg) (12/11 1225)  Intake/Output from previous day: 12/11 0701 - 12/12 0700 In: 1640 [P.O.:240; I.V.:1400] Out: 515 [Urine:500; Blood:15] Intake/Output this shift: No intake/output data recorded.  No results for input(s): HGB in the last 72 hours. No results for input(s): WBC, RBC, HCT, PLT in the last 72 hours. No results for input(s): NA, K, CL, CO2, BUN, CREATININE, GLUCOSE, CALCIUM in the last 72 hours. No results for input(s): LABPT, INR in the last 72 hours.  Sensation intact distally Intact pulses distally Dorsiflexion/Plantar flexion intact Incision: dressing C/D/I No cellulitis present Compartment soft  Assessment/Plan: 1 Day Post-Op Procedure(s) (LRB): RIGHT UNICOMPARTMENTAL KNEE (Right) Up with therapy Discharge home with home health this afternoon.  Mcarthur Rossetti 09/17/2017, 7:26 AM

## 2017-09-18 ENCOUNTER — Telehealth (INDEPENDENT_AMBULATORY_CARE_PROVIDER_SITE_OTHER): Payer: Self-pay | Admitting: Physician Assistant

## 2017-09-18 DIAGNOSIS — M1712 Unilateral primary osteoarthritis, left knee: Secondary | ICD-10-CM | POA: Diagnosis not present

## 2017-09-18 DIAGNOSIS — Z7982 Long term (current) use of aspirin: Secondary | ICD-10-CM | POA: Diagnosis not present

## 2017-09-18 DIAGNOSIS — Z85818 Personal history of malignant neoplasm of other sites of lip, oral cavity, and pharynx: Secondary | ICD-10-CM | POA: Diagnosis not present

## 2017-09-18 DIAGNOSIS — Z471 Aftercare following joint replacement surgery: Secondary | ICD-10-CM | POA: Diagnosis not present

## 2017-09-18 DIAGNOSIS — G5603 Carpal tunnel syndrome, bilateral upper limbs: Secondary | ICD-10-CM | POA: Diagnosis not present

## 2017-09-18 DIAGNOSIS — Z87891 Personal history of nicotine dependence: Secondary | ICD-10-CM | POA: Diagnosis not present

## 2017-09-18 DIAGNOSIS — Z96651 Presence of right artificial knee joint: Secondary | ICD-10-CM | POA: Diagnosis not present

## 2017-09-18 DIAGNOSIS — Z9181 History of falling: Secondary | ICD-10-CM | POA: Diagnosis not present

## 2017-09-18 NOTE — Telephone Encounter (Signed)
[name] -PT with [facility] called needing verbal orders for HHPT 2 wk 3   The number to contact [name] is [phone]

## 2017-09-19 NOTE — Telephone Encounter (Signed)
Verbal order left on VM  

## 2017-09-22 DIAGNOSIS — Z471 Aftercare following joint replacement surgery: Secondary | ICD-10-CM | POA: Diagnosis not present

## 2017-09-22 DIAGNOSIS — Z96651 Presence of right artificial knee joint: Secondary | ICD-10-CM | POA: Diagnosis not present

## 2017-09-22 DIAGNOSIS — G5603 Carpal tunnel syndrome, bilateral upper limbs: Secondary | ICD-10-CM | POA: Diagnosis not present

## 2017-09-22 DIAGNOSIS — Z9181 History of falling: Secondary | ICD-10-CM | POA: Diagnosis not present

## 2017-09-22 DIAGNOSIS — Z85818 Personal history of malignant neoplasm of other sites of lip, oral cavity, and pharynx: Secondary | ICD-10-CM | POA: Diagnosis not present

## 2017-09-22 DIAGNOSIS — Z87891 Personal history of nicotine dependence: Secondary | ICD-10-CM | POA: Diagnosis not present

## 2017-09-22 DIAGNOSIS — Z7982 Long term (current) use of aspirin: Secondary | ICD-10-CM | POA: Diagnosis not present

## 2017-09-22 DIAGNOSIS — M1712 Unilateral primary osteoarthritis, left knee: Secondary | ICD-10-CM | POA: Diagnosis not present

## 2017-09-29 DIAGNOSIS — Z87891 Personal history of nicotine dependence: Secondary | ICD-10-CM | POA: Diagnosis not present

## 2017-09-29 DIAGNOSIS — Z85818 Personal history of malignant neoplasm of other sites of lip, oral cavity, and pharynx: Secondary | ICD-10-CM | POA: Diagnosis not present

## 2017-09-29 DIAGNOSIS — G5603 Carpal tunnel syndrome, bilateral upper limbs: Secondary | ICD-10-CM | POA: Diagnosis not present

## 2017-09-29 DIAGNOSIS — M1712 Unilateral primary osteoarthritis, left knee: Secondary | ICD-10-CM | POA: Diagnosis not present

## 2017-09-29 DIAGNOSIS — Z7982 Long term (current) use of aspirin: Secondary | ICD-10-CM | POA: Diagnosis not present

## 2017-09-29 DIAGNOSIS — Z471 Aftercare following joint replacement surgery: Secondary | ICD-10-CM | POA: Diagnosis not present

## 2017-09-29 DIAGNOSIS — Z96651 Presence of right artificial knee joint: Secondary | ICD-10-CM | POA: Diagnosis not present

## 2017-09-29 DIAGNOSIS — Z9181 History of falling: Secondary | ICD-10-CM | POA: Diagnosis not present

## 2017-10-06 ENCOUNTER — Ambulatory Visit (INDEPENDENT_AMBULATORY_CARE_PROVIDER_SITE_OTHER): Payer: PPO | Admitting: Physician Assistant

## 2017-10-06 ENCOUNTER — Encounter (INDEPENDENT_AMBULATORY_CARE_PROVIDER_SITE_OTHER): Payer: Self-pay | Admitting: Physician Assistant

## 2017-10-06 DIAGNOSIS — G5603 Carpal tunnel syndrome, bilateral upper limbs: Secondary | ICD-10-CM | POA: Diagnosis not present

## 2017-10-06 DIAGNOSIS — Z96651 Presence of right artificial knee joint: Secondary | ICD-10-CM | POA: Diagnosis not present

## 2017-10-06 DIAGNOSIS — Z7982 Long term (current) use of aspirin: Secondary | ICD-10-CM | POA: Diagnosis not present

## 2017-10-06 DIAGNOSIS — M1712 Unilateral primary osteoarthritis, left knee: Secondary | ICD-10-CM | POA: Diagnosis not present

## 2017-10-06 DIAGNOSIS — Z471 Aftercare following joint replacement surgery: Secondary | ICD-10-CM | POA: Diagnosis not present

## 2017-10-06 DIAGNOSIS — Z9181 History of falling: Secondary | ICD-10-CM | POA: Diagnosis not present

## 2017-10-06 DIAGNOSIS — Z87891 Personal history of nicotine dependence: Secondary | ICD-10-CM | POA: Diagnosis not present

## 2017-10-06 DIAGNOSIS — Z85818 Personal history of malignant neoplasm of other sites of lip, oral cavity, and pharynx: Secondary | ICD-10-CM | POA: Diagnosis not present

## 2017-10-06 MED ORDER — OXYCODONE-ACETAMINOPHEN 5-325 MG PO TABS: 1.0000 | ORAL_TABLET | ORAL | 0 refills | Status: DC | PRN

## 2017-10-06 NOTE — Progress Notes (Signed)
[name] returns today 3 weeks status post right knee unicompartmental arthroplasty.  She is overall doing well no chills, fevers, chest pain or shortness of breath.  She is working with home physical therapy and is progressing well.  She still using a walker asking when she can advance to a cane.  She is asking for refill on her Roxicodone.  Review of systems: See HPI otherwise negative  Physical exam: Right knee 0degrees to 90 degrees easily.  She came moved back further to approximately 105 degrees.  No instability with valgus varus stressing.  Surgical incisions healing well.  Slight dehiscence of the distal wound.  Calf supple nontender.  Impression: 3 weeks status post right knee unicompartmental arthroplasty  Plan: She will continue to work with home physical therapy on range of motion strengthening.  Transition to outpatient therapy.  She will go to a cane as recommended by physical therapy.  Scar tissue mobilization encouraged.  Like to see her back in 2 weeks just for wound check.  Sooner if there is any concerns of infection.

## 2017-10-09 ENCOUNTER — Telehealth: Payer: Self-pay

## 2017-10-09 DIAGNOSIS — Z9181 History of falling: Secondary | ICD-10-CM | POA: Diagnosis not present

## 2017-10-09 DIAGNOSIS — Z96651 Presence of right artificial knee joint: Secondary | ICD-10-CM | POA: Diagnosis not present

## 2017-10-09 DIAGNOSIS — M1712 Unilateral primary osteoarthritis, left knee: Secondary | ICD-10-CM | POA: Diagnosis not present

## 2017-10-09 DIAGNOSIS — Z87891 Personal history of nicotine dependence: Secondary | ICD-10-CM | POA: Diagnosis not present

## 2017-10-09 DIAGNOSIS — Z7982 Long term (current) use of aspirin: Secondary | ICD-10-CM | POA: Diagnosis not present

## 2017-10-09 DIAGNOSIS — Z85818 Personal history of malignant neoplasm of other sites of lip, oral cavity, and pharynx: Secondary | ICD-10-CM | POA: Diagnosis not present

## 2017-10-09 DIAGNOSIS — Z471 Aftercare following joint replacement surgery: Secondary | ICD-10-CM | POA: Diagnosis not present

## 2017-10-09 DIAGNOSIS — G5603 Carpal tunnel syndrome, bilateral upper limbs: Secondary | ICD-10-CM | POA: Diagnosis not present

## 2017-10-09 NOTE — Telephone Encounter (Signed)
Notified patient, she will contact ortho office.

## 2017-10-09 NOTE — Telephone Encounter (Signed)
Called patient to follow up after recent hospital discharge following a knee replacement.  Patient states that she is following under [name] and therapy right now.  She thanks [place] for our concern but does not want to make an appointment with [place] at this time.   Patient does request for [place] to complete a Handicap sticker request form for her as due to her limitations now with her knee she is having difficulty walking distances.   Avie Echevaria, NP please advise if okay to complete form?  We have blank forms here at the office, let me know if Threasa Beards needs one to complete for patient if approved.  Patient requests a call when form is ready to be picked up.  Thanks.

## 2017-10-09 NOTE — Telephone Encounter (Signed)
This should be done by ortho

## 2017-10-10 ENCOUNTER — Telehealth (INDEPENDENT_AMBULATORY_CARE_PROVIDER_SITE_OTHER): Payer: Self-pay | Admitting: Physician Assistant

## 2017-10-10 NOTE — Telephone Encounter (Signed)
See below

## 2017-10-10 NOTE — Telephone Encounter (Signed)
Needs a call back concerning her right knee she recently had surgery on. Wanted a call back directly from [place] if possible. CB # [phone]

## 2017-10-12 ENCOUNTER — Encounter (INDEPENDENT_AMBULATORY_CARE_PROVIDER_SITE_OTHER): Payer: Self-pay | Admitting: Physician Assistant

## 2017-10-13 DIAGNOSIS — M25561 Pain in right knee: Secondary | ICD-10-CM | POA: Diagnosis not present

## 2017-10-15 DIAGNOSIS — M25561 Pain in right knee: Secondary | ICD-10-CM | POA: Diagnosis not present

## 2017-10-20 ENCOUNTER — Ambulatory Visit (INDEPENDENT_AMBULATORY_CARE_PROVIDER_SITE_OTHER): Payer: PPO | Admitting: Physician Assistant

## 2017-10-20 ENCOUNTER — Encounter (INDEPENDENT_AMBULATORY_CARE_PROVIDER_SITE_OTHER): Payer: Self-pay | Admitting: Physician Assistant

## 2017-10-20 VITALS — Ht 62.0 in | Wt 160.0 lb

## 2017-10-20 DIAGNOSIS — M25561 Pain in right knee: Secondary | ICD-10-CM | POA: Diagnosis not present

## 2017-10-20 DIAGNOSIS — Z96651 Presence of right artificial knee joint: Secondary | ICD-10-CM

## 2017-10-20 MED ORDER — HYDROCODONE-ACETAMINOPHEN 5-325 MG PO TABS: 1.0000 | ORAL_TABLET | Freq: Four times a day (QID) | ORAL | 0 refills | Status: DC | PRN

## 2017-10-20 NOTE — Progress Notes (Signed)
Mrs. Hosterman returns today just over a month status post right knee unicompartmental arthroplasty.  She states overall she is trending towards improvement.  She is continue work with physical therapy.  She is ambulating with a cane.  Physical exam right knee: Full extension.  Flexion to 105 degrees.  No instability with valgus varus stressing.  Surgical incisions well-healed.  Calf supple nontender.  Impression: 1 month status post right knee uni-arthroplasty  Plan: She will continue to work on range of motion strengthening of the knee.  Scar tissue mobilization.  Follow-up with [place] in 1 month check progress lack of.  Refill on her pain medicine was given however we did change her to [place].  She is to use these sparingly.

## 2017-10-22 ENCOUNTER — Encounter (INDEPENDENT_AMBULATORY_CARE_PROVIDER_SITE_OTHER): Payer: Self-pay | Admitting: Physician Assistant

## 2017-10-22 DIAGNOSIS — M25561 Pain in right knee: Secondary | ICD-10-CM | POA: Diagnosis not present

## 2017-10-27 DIAGNOSIS — M25561 Pain in right knee: Secondary | ICD-10-CM | POA: Diagnosis not present

## 2017-10-29 DIAGNOSIS — M25561 Pain in right knee: Secondary | ICD-10-CM | POA: Diagnosis not present

## 2017-11-03 DIAGNOSIS — M25561 Pain in right knee: Secondary | ICD-10-CM | POA: Diagnosis not present

## 2017-11-05 DIAGNOSIS — M25561 Pain in right knee: Secondary | ICD-10-CM | POA: Diagnosis not present

## 2017-11-10 DIAGNOSIS — M25561 Pain in right knee: Secondary | ICD-10-CM | POA: Diagnosis not present

## 2017-11-12 ENCOUNTER — Telehealth (INDEPENDENT_AMBULATORY_CARE_PROVIDER_SITE_OTHER): Payer: Self-pay | Admitting: Radiology

## 2017-11-12 DIAGNOSIS — M25561 Pain in right knee: Secondary | ICD-10-CM | POA: Diagnosis not present

## 2017-11-12 NOTE — Telephone Encounter (Signed)
[facility] called and asked if patient needed abx for dental procedure.  I advised yes, she will need them through 12/15/17 when she is 3 months postop per Dr Trevor Mace protocol.

## 2017-11-17 DIAGNOSIS — M25561 Pain in right knee: Secondary | ICD-10-CM | POA: Diagnosis not present

## 2017-11-19 DIAGNOSIS — M25561 Pain in right knee: Secondary | ICD-10-CM | POA: Diagnosis not present

## 2017-11-21 ENCOUNTER — Encounter: Payer: Self-pay | Admitting: Family Medicine

## 2017-11-21 ENCOUNTER — Other Ambulatory Visit: Payer: Self-pay

## 2017-11-21 ENCOUNTER — Ambulatory Visit (INDEPENDENT_AMBULATORY_CARE_PROVIDER_SITE_OTHER): Payer: PPO | Admitting: Family Medicine

## 2017-11-21 DIAGNOSIS — J029 Acute pharyngitis, unspecified: Secondary | ICD-10-CM | POA: Diagnosis not present

## 2017-11-21 MED ORDER — BENZONATATE 200 MG PO CAPS: 200.0000 mg | ORAL_CAPSULE | Freq: Two times a day (BID) | ORAL | 0 refills | Status: DC | PRN

## 2017-11-21 NOTE — Progress Notes (Signed)
   Subjective:    Patient ID: [name], female    DOB: 10-16-1940, 77 y.o.   MRN: [id number]  Cough  This is a new problem. The current episode started in the past 7 days. The cough is non-productive. Associated symptoms include ear congestion and rhinorrhea. Pertinent negatives include no chills, ear pain, fever, myalgias, nasal congestion, postnasal drip, sore throat, shortness of breath or wheezing. Associated symptoms comments: Hoarse voice  sneeze. The symptoms are aggravated by lying down. Treatments tried: delsym, flonase. The treatment provided no relief. Her past medical history is significant for environmental allergies. There is no history of asthma or COPD.   Sick contacts: none  Blood pressure 130/64, pulse 85, temperature 98.6 F (37 C), temperature source Oral, height 5\' 2"  (1.575 m), weight 167 lb 8 oz (76 kg), SpO2 96 %.  Review of Systems  Constitutional: Negative for chills and fever.  HENT: Positive for rhinorrhea. Negative for ear pain, postnasal drip and sore throat.   Respiratory: Positive for cough. Negative for shortness of breath and wheezing.   Musculoskeletal: Negative for myalgias.  Allergic/Immunologic: Positive for environmental allergies.       Objective:   Physical Exam  Constitutional: Vital signs are normal. She appears well-developed and well-nourished. She is cooperative.  Non-toxic appearance. She does not appear ill. No distress.  HENT:  Head: Normocephalic.  Right Ear: Hearing, tympanic membrane, external ear and ear canal normal. Tympanic membrane is not erythematous, not retracted and not bulging.  Left Ear: Hearing, tympanic membrane, external ear and ear canal normal. Tympanic membrane is not erythematous, not retracted and not bulging.  Nose: Mucosal edema and rhinorrhea present. Right sinus exhibits no maxillary sinus tenderness and no frontal sinus tenderness. Left sinus exhibits no maxillary sinus tenderness and no frontal sinus  tenderness.  Mouth/Throat: Uvula is midline, oropharynx is clear and moist and mucous membranes are normal.  Eyes: Conjunctivae, EOM and lids are normal. Pupils are equal, round, and reactive to light. Lids are everted and swept, no foreign bodies found.  Neck: Trachea normal and normal range of motion. Neck supple. Carotid bruit is not present. No thyroid mass and no thyromegaly present.  Cardiovascular: Normal rate, regular rhythm, S1 normal, S2 normal, normal heart sounds, intact distal pulses and normal pulses. Exam reveals no gallop and no friction rub.  No murmur heard. Pulmonary/Chest: Effort normal and breath sounds normal. No tachypnea. No respiratory distress. She has no decreased breath sounds. She has no wheezes. She has no rhonchi. She has no rales.  Neurological: She is alert.  Skin: Skin is warm, dry and intact. No rash noted.  Psychiatric: Her speech is normal and behavior is normal. Judgment normal. Her mood appears not anxious. Cognition and memory are normal. She does not exhibit a depressed mood.          Assessment & Plan:

## 2017-11-21 NOTE — Patient Instructions (Addendum)
Rest, fluids.  Can use benzonatate as needed for cough.  Continue flonase and consider adding zyrtec OTC at bedtime.  Go to ER if severe shortness or breath. Cal office if not improiving in 7-10 days.

## 2017-11-21 NOTE — Assessment & Plan Note (Signed)
Most likely viral , no sign of bacterial infection.  Possible allergy involvement.

## 2017-11-24 ENCOUNTER — Encounter (INDEPENDENT_AMBULATORY_CARE_PROVIDER_SITE_OTHER): Payer: Self-pay | Admitting: Physician Assistant

## 2017-11-24 ENCOUNTER — Ambulatory Visit (INDEPENDENT_AMBULATORY_CARE_PROVIDER_SITE_OTHER): Payer: PPO | Admitting: Physician Assistant

## 2017-11-24 ENCOUNTER — Ambulatory Visit: Payer: Self-pay

## 2017-11-24 DIAGNOSIS — Z96651 Presence of right artificial knee joint: Secondary | ICD-10-CM

## 2017-11-24 DIAGNOSIS — M25561 Pain in right knee: Secondary | ICD-10-CM | POA: Diagnosis not present

## 2017-11-24 NOTE — Progress Notes (Signed)
HPI [name] returns today status post right knee unicompartmental arthroplasty.  She is overall doing well.  She does have some numbness lateral aspect of the knee.  But feels that her range of motion strength is improving.  She does have some tenderness medial aspect of the knee with increased activity but this is also dissipating.  She has at least 2 more visits of physical therapy.  She is taking diclofenac as needed using heat and ice as needed.  Physical exam: Right knee she has full extension flexion to 110 degrees.  No instability valgus varus stressing.  Surgical incisions well-healed no signs of infection.  Impression: Status post right knee unicompartmental arthroplasty 69 days postop  Plan: She will continue work with therapy for strengthening and transition to home exercise program.  I talked to her about continue to work on quad strengthening throughout the year at least 3-4 times a week.  She will follow-up with [place] at her one-year anniversary in December at that time we will obtain AP and lateral views of the knee.  She will follow-up sooner if there is any questions or concerns

## 2017-11-24 NOTE — Telephone Encounter (Signed)
  Pt. Saw Dr. Diona Browner Friday 11/21/17. Reports Tessalon Perles are not helping her cough. States she is not sleeping because of cough. Would like something else called in.  Reason for Disposition . Cough  Answer Assessment - Initial Assessment Questions 1. ONSET: "When did the cough begin?"      Started Last Wed. 2. SEVERITY: "How bad is the cough today?"      Yes 3. RESPIRATORY DISTRESS: "Describe your breathing."      No 4. FEVER: "Do you have a fever?" If so, ask: "What is your temperature, how was it measured, and when did it start?"     No 5. HEMOPTYSIS: "Are you coughing up any blood?" If so ask: "How much?" (flecks, streaks, tablespoons, etc.)     No 6. TREATMENT: "What have you done so far to treat the cough?" (e.g., meds, fluids, humidifier)     Tessalon Perles not helping. 7. CARDIAC HISTORY: "Do you have any history of heart disease?" (e.g., heart attack, congestive heart failure)      No 8. LUNG HISTORY: "Do you have any history of lung disease?"  (e.g., pulmonary embolus, asthma, emphysema)     No 9. PE RISK FACTORS: "Do you have a history of blood clots?" (or: recent major surgery, recent prolonged travel, bedridden )     No 10. OTHER SYMPTOMS: "Do you have any other symptoms? (e.g., runny nose, wheezing, chest pain)       No 11. PREGNANCY: "Is there any chance you are pregnant?" "When was your last menstrual period?"       No 12. TRAVEL: "Have you traveled out of the country in the last month?" (e.g., travel history, exposures)       No  Protocols used: COUGH - ACUTE NON-PRODUCTIVE-A-AH

## 2017-11-25 ENCOUNTER — Other Ambulatory Visit (INDEPENDENT_AMBULATORY_CARE_PROVIDER_SITE_OTHER): Payer: Self-pay

## 2017-11-25 ENCOUNTER — Telehealth (INDEPENDENT_AMBULATORY_CARE_PROVIDER_SITE_OTHER): Payer: Self-pay | Admitting: Orthopaedic Surgery

## 2017-11-25 MED ORDER — AMOXICILLIN 500 MG PO TABS: ORAL_TABLET | ORAL | 0 refills | Status: DC

## 2017-11-25 MED ORDER — GUAIFENESIN-CODEINE 100-10 MG/5ML PO SYRP
5.0000 mL | ORAL_SOLUTION | Freq: Every evening | ORAL | 0 refills | Status: DC | PRN
Start: 2017-11-25 — End: 2018-04-21

## 2017-11-25 NOTE — Telephone Encounter (Deleted)
Copied from [place]. Topic: General - Other >> Nov 25, 2017  2:00 PM Lolita Rieger, [place] wrote: Reason for CRM: pt called back and would like a call back

## 2017-11-25 NOTE — Addendum Note (Signed)
Addended by: Eliezer Lofts E on: 11/25/2017 02:13 PM   Modules accepted: Orders

## 2017-11-25 NOTE — Telephone Encounter (Signed)
[name] notified as instructed by telephone.

## 2017-11-25 NOTE — Telephone Encounter (Signed)
Patient aware I called this in for her  

## 2017-11-25 NOTE — Telephone Encounter (Signed)
Please call pt to discuss pt care related to dentist visit

## 2017-11-25 NOTE — Telephone Encounter (Signed)
Sent in rx for cough suppressant to take at night.

## 2017-11-26 DIAGNOSIS — M25561 Pain in right knee: Secondary | ICD-10-CM | POA: Diagnosis not present

## 2017-12-03 DIAGNOSIS — N3281 Overactive bladder: Secondary | ICD-10-CM | POA: Diagnosis not present

## 2017-12-12 DIAGNOSIS — N3281 Overactive bladder: Secondary | ICD-10-CM | POA: Diagnosis not present

## 2017-12-12 DIAGNOSIS — N3946 Mixed incontinence: Secondary | ICD-10-CM | POA: Diagnosis not present

## 2017-12-12 DIAGNOSIS — N3941 Urge incontinence: Secondary | ICD-10-CM | POA: Diagnosis not present

## 2017-12-16 DIAGNOSIS — Z85819 Personal history of malignant neoplasm of unspecified site of lip, oral cavity, and pharynx: Secondary | ICD-10-CM | POA: Diagnosis not present

## 2017-12-16 DIAGNOSIS — R040 Epistaxis: Secondary | ICD-10-CM | POA: Diagnosis not present

## 2017-12-22 DIAGNOSIS — N3946 Mixed incontinence: Secondary | ICD-10-CM | POA: Diagnosis not present

## 2018-01-08 ENCOUNTER — Encounter (INDEPENDENT_AMBULATORY_CARE_PROVIDER_SITE_OTHER): Payer: Self-pay | Admitting: Physician Assistant

## 2018-01-12 ENCOUNTER — Encounter (INDEPENDENT_AMBULATORY_CARE_PROVIDER_SITE_OTHER): Payer: Self-pay | Admitting: Physician Assistant

## 2018-01-14 ENCOUNTER — Ambulatory Visit (INDEPENDENT_AMBULATORY_CARE_PROVIDER_SITE_OTHER): Payer: PPO

## 2018-01-14 ENCOUNTER — Ambulatory Visit (INDEPENDENT_AMBULATORY_CARE_PROVIDER_SITE_OTHER): Payer: PPO | Admitting: Physician Assistant

## 2018-01-14 ENCOUNTER — Ambulatory Visit
Admission: RE | Admit: 2018-01-14 | Discharge: 2018-01-14 | Disposition: A | Discharge status: Home / Self Care | Payer: PPO | Source: Ambulatory Visit | Attending: Physician Assistant | Admitting: Physician Assistant

## 2018-01-14 DIAGNOSIS — G8929 Other chronic pain: Secondary | ICD-10-CM

## 2018-01-14 DIAGNOSIS — Z471 Aftercare following joint replacement surgery: Secondary | ICD-10-CM | POA: Diagnosis not present

## 2018-01-14 DIAGNOSIS — M25561 Pain in right knee: Principal | ICD-10-CM

## 2018-01-14 DIAGNOSIS — Z96651 Presence of right artificial knee joint: Secondary | ICD-10-CM | POA: Diagnosis not present

## 2018-01-14 NOTE — Progress Notes (Deleted)
xr knee

## 2018-01-14 NOTE — Progress Notes (Signed)
HPI: [name] returns today 4 months status post right knee unicompartmental arthroplasty.  She is having pain in the knee and swelling.  Pain is worse is standing or yard work.  She is taken some Tylenol 1-2 tabs daily.  She has had no new injury to the knee.  No fevers or chills.  Physical exam: Right knee surgical incisions well-healed.  She has fluid range of motion of the knee without significant pain.  Tenderness along medial joint line.  Calf supple nontender.  No instability valgus varus stressing of the knee.  Valgus deformity of the knee is present.  Radiographs: Right knee AP and lateral views.  Lateral view showed no complicating features compared to previous films.  The AP view however it does show that the need to be in valgus and the tibial component appears rotated on each films question positioning versus loosening.  Impression: Status post right knee uni-arthroplasty  Plan: We will obtain a CT scan to rule out hardware failure.  Have her follow-up next Monday to go over the results and discuss further treatment.  In the interim she is weightbearing as tolerated and to continue to work on Forensic scientist.

## 2018-01-19 ENCOUNTER — Encounter (INDEPENDENT_AMBULATORY_CARE_PROVIDER_SITE_OTHER): Payer: Self-pay | Admitting: Physician Assistant

## 2018-01-19 ENCOUNTER — Ambulatory Visit (INDEPENDENT_AMBULATORY_CARE_PROVIDER_SITE_OTHER): Payer: PPO | Admitting: Physician Assistant

## 2018-01-19 VITALS — Ht 62.0 in | Wt 167.5 lb

## 2018-01-19 DIAGNOSIS — Z96651 Presence of right artificial knee joint: Secondary | ICD-10-CM | POA: Diagnosis not present

## 2018-01-19 NOTE — Progress Notes (Signed)
HPI: [name] returns today to go over the CT scan of the right knee.  She states with prolonged walking she still has pain in the medial aspect of the knee.  She had no fevers chills shortness of breath. CT scan dated 01/14/2018 is reviewed with the patient.  Radiologist mentioned a thin lucency around the posterior femoral stem component of the condylar component which is felt to may be reflect early loosening versus infection.  Large joint effusion noted.  These images were reviewed by [name] and myself and felt to show no signs of loosening or infection the components overall are in good position good alignment.  Images were reviewed with the patient today.  Right knee: Surgical incisions well-healed.  No instability valgus varus stressing.  No abnormal warmth erythema or edema.  She has a slight effusion.  Knee is prepped with Betadine and ethyl chloride used to anesthetize the skin and then 20 cc of yellow appearing aspirates obtained patient tolerates well.  Impression: 4 months status post right knee unicompartmental arthroplasty  Plan: She will continue work on quad strengthening.  We will see her back in a month to check her progress or lack of.  Questions were encouraged and answered.

## 2018-02-10 DIAGNOSIS — H5200 Hypermetropia, unspecified eye: Secondary | ICD-10-CM | POA: Diagnosis not present

## 2018-02-16 ENCOUNTER — Encounter (INDEPENDENT_AMBULATORY_CARE_PROVIDER_SITE_OTHER): Payer: Self-pay | Admitting: Physician Assistant

## 2018-02-16 ENCOUNTER — Ambulatory Visit (INDEPENDENT_AMBULATORY_CARE_PROVIDER_SITE_OTHER): Payer: PPO | Admitting: Physician Assistant

## 2018-02-16 DIAGNOSIS — M1711 Unilateral primary osteoarthritis, right knee: Secondary | ICD-10-CM

## 2018-02-16 NOTE — Progress Notes (Signed)
HPI: Ms. Roarty returns today follow-up of her right knee uni-compartment arthroplasty.  She is overall trending towards provement.  She states that now the knee is doing pretty good.  She has occasional pain takes Tylenol which helps.  Physical exam: General well-developed well-nourished female no acute distress mood and affect appropriate. Right knee: Full extension and flexion to approximately 110 degrees.  No instability valgus varus stressing.  Surgical incisions healing well.  Calf supple nontender.  Patient ambulates with a nonantalgic gait without an assistive device.  Impression: Status post right knee unicompartmental arthroplasty 09/16/2017  Plan: She will continue to work on range of motion strengthening of the right knee.  See her back at 1 year postop at that time we will obtain AP and lateral views of the knee.  Questions encouraged and answered at length.  She will follow-up sooner if there is any concerns.

## 2018-03-25 DIAGNOSIS — N3941 Urge incontinence: Secondary | ICD-10-CM | POA: Diagnosis not present

## 2018-04-06 DIAGNOSIS — K219 Gastro-esophageal reflux disease without esophagitis: Secondary | ICD-10-CM | POA: Insufficient documentation

## 2018-04-21 ENCOUNTER — Ambulatory Visit (INDEPENDENT_AMBULATORY_CARE_PROVIDER_SITE_OTHER): Payer: PPO | Admitting: Internal Medicine

## 2018-04-21 ENCOUNTER — Encounter: Payer: Self-pay | Admitting: Internal Medicine

## 2018-04-21 VITALS — BP 128/82 | HR 73 | Temp 98.3°F | Wt 172.0 lb

## 2018-04-21 DIAGNOSIS — K219 Gastro-esophageal reflux disease without esophagitis: Secondary | ICD-10-CM

## 2018-04-21 DIAGNOSIS — R1013 Epigastric pain: Secondary | ICD-10-CM | POA: Diagnosis not present

## 2018-04-21 LAB — COMPREHENSIVE METABOLIC PANEL
ALBUMIN: 4 g/dL (ref 3.5–5.2)
ALT: 14 U/L (ref 0–35)
AST: 17 U/L (ref 0–37)
Alkaline Phosphatase: 66 U/L (ref 39–117)
BUN: 10 mg/dL (ref 6–23)
CO2: 32 mEq/L (ref 19–32)
Calcium: 9.7 mg/dL (ref 8.4–10.5)
Chloride: 101 mEq/L (ref 96–112)
Creatinine, Ser: 0.7 mg/dL (ref 0.40–1.20)
GFR: 86.31 mL/min (ref >60.00)
Glucose, Bld: 108 mg/dL — ABNORMAL HIGH (ref 70–99)
POTASSIUM: 4.5 meq/L (ref 3.5–5.1)
Sodium: 140 mEq/L (ref 135–145)
TOTAL PROTEIN: 7.1 g/dL (ref 6.0–8.3)
Total Bilirubin: 0.4 mg/dL (ref 0.2–1.2)

## 2018-04-21 LAB — CBC
HEMATOCRIT: 42.2 % (ref 36.0–46.0)
HEMOGLOBIN: 14.3 g/dL (ref 12.0–15.0)
MCHC: 34 g/dL (ref 30.0–36.0)
MCV: 89.1 fl (ref 78.0–100.0)
Platelets: 198 10*3/uL (ref 150.0–400.0)
RBC: 4.74 Mil/uL (ref 3.87–5.11)
RDW: 13.3 % (ref 11.5–15.5)
WBC: 5.9 10*3/uL (ref 4.0–10.5)

## 2018-04-21 LAB — H. PYLORI ANTIBODY, IGG: H PYLORI IGG: NEGATIVE

## 2018-04-21 LAB — LIPASE: Lipase: 24 U/L (ref 11.0–59.0)

## 2018-04-21 LAB — AMYLASE: AMYLASE: 34 U/L (ref 27–131)

## 2018-04-21 NOTE — Progress Notes (Signed)
Subjective:    Patient ID: [name], female    DOB: Jul 30, 1941, 77 y.o.   MRN: [id number]  HPI  Pt presents to the clinic today with c/o indigestion. She reports this started 4 day ago. She reports some nausea, reflux and substernal chest discomfort which she describes as a burning sensation. She has not associated this with certain foods. She also reports a dry cough that has been going on longer, but denies chest pain or shortness of breath. She denies vomiting, diarrhea, constipation or blood in her stool. She denies runny nose, nasal congestion, ear pain, or sore throat. She denies recent changes in diet or medication. She has tried Nexium, Tums and Alka Seltzer without any relief. There is no UGI on file. ECG from 06/2015 reviewed. Echo from 07/2015 reviewed.  Review of Systems      Past Medical History:  Diagnosis Date  . Arthritis   . Cancer ([place])   . History of chicken pox   . History of colon polyps   . History of mouth cancer   . Osteoarthritis of knee    Medial compartment, Right    Current Outpatient Medications  Medication Sig Dispense Refill  . aspirin EC 81 MG tablet Take by mouth.    . benzonatate (TESSALON) 200 MG capsule Take 1 capsule (200 mg total) by mouth 2 (two) times daily as needed for cough. 20 capsule 0  . Calcium Carb-Cholecalciferol (CALCIUM 600/VITAMIN D3) 600-800 MG-UNIT TABS Take 1 tablet by mouth 2 (two) times daily.     . fluticasone (FLONASE) 50 MCG/ACT nasal spray Place 2 sprays into the nose at bedtime.     . Glucosamine-Chondroit-Vit C-Mn (GLUCOSAMINE 1500 COMPLEX) CAPS Take 1 capsule by mouth 2 (two) times daily.     Marland Kitchen guaiFENesin-codeine (ROBITUSSIN AC) 100-10 MG/5ML syrup Take 5-10 mLs by mouth at bedtime as needed for cough. 180 mL 0  . Multiple Vitamins-Minerals (MULTIVITAMIN WITH MINERALS) tablet Take 1 tablet by mouth daily.    Marland Kitchen tolterodine (DETROL) 2 MG tablet Take 2 mg by mouth 2 (two) times daily.    . Turmeric 500 MG CAPS Take  1 capsule by mouth daily.     No current facility-administered medications for this visit.     No Known Allergies  No family history on file.  Social History   Socioeconomic History  . Marital status: Married    Spouse name: Not on file  . Number of children: Not on file  . Years of education: Not on file  . Highest education level: Not on file  Occupational History  . Not on file  Social Needs  . Financial resource strain: Not on file  . Food insecurity:    Worry: Not on file    Inability: Not on file  . Transportation needs:    Medical: Not on file    Non-medical: Not on file  Tobacco Use  . Smoking status: Former Smoker    Last attempt to quit: 12/28/1986    Years since quitting: 31.3  . Smokeless tobacco: Never Used  Substance and Sexual Activity  . Alcohol use: No  . Drug use: No  . Sexual activity: Not Currently  Lifestyle  . Physical activity:    Days per week: Not on file    Minutes per session: Not on file  . Stress: Not on file  Relationships  . Social connections:    Talks on phone: Not on file    Gets together: Not on  file    Attends religious service: Not on file    Active member of club or organization: Not on file    Attends meetings of clubs or organizations: Not on file    Relationship status: Not on file  . Intimate partner violence:    Fear of current or ex partner: Not on file    Emotionally abused: Not on file    Physically abused: Not on file    Forced sexual activity: Not on file  Other Topics Concern  . Not on file  Social History Narrative  . Not on file     Constitutional: Denies fever, malaise, fatigue, headache or abrupt weight changes.  HEENT: Denies eye pain, eye redness, ear pain, ringing in the ears, wax buildup, runny nose, nasal congestion, bloody nose, or sore throat. Respiratory: Pt reports cough. Denies difficulty breathing, shortness of breath, or sputum production.   Cardiovascular: Pt reports chest discomfort.  Denies chest pain, chest tightness, palpitations or swelling in the hands or feet.  Gastrointestinal: Pt reports nausea. Denies abdominal pain, bloating, constipation, diarrhea or blood in the stool.  GU: Denies urgency, frequency, pain with urination, burning sensation, blood in urine, odor or discharge. Musculoskeletal: Denies decrease in range of motion, difficulty with gait, muscle pain or joint pain and swelling.  Skin: Denies redness, rashes, lesions or ulcercations.    No other specific complaints in a complete review of systems (except as listed in HPI above).  Objective:   Physical Exam   BP 128/82   Pulse 73   Temp 98.3 F (36.8 C) (Oral)   Wt 172 lb (78 kg)   SpO2 97%   BMI 31.46 kg/m  Wt Readings from Last 3 Encounters:  04/21/18 172 lb (78 kg)  01/19/18 167 lb 8 oz (76 kg)  11/21/17 167 lb 8 oz (76 kg)    General: Appears her stated age, well developed, well nourished in NAD. HEENT: Throat/Mouth: Teeth present, mucosa pink and moist, no exudate, lesions or ulcerations noted.  Cardiovascular: Normal rate and rhythm. S1,S2 noted.  No murmur, rubs or gallops noted.  Pulmonary/Chest: Normal effort and positive vesicular breath sounds. No respiratory distress. No wheezes, rales or ronchi noted.  Abdomen: Soft and tender in the epigastric region. Normal bowel sounds. No distention or masses noted.  Musculoskeletal: Chest wall nontender to palpation. Neurological: Alert and oriented.    BMET    Component Value Date/Time   NA 135 09/09/2017 1359   K 3.9 09/09/2017 1359   CL 100 (L) 09/09/2017 1359   CO2 26 09/09/2017 1359   GLUCOSE 106 (H) 09/09/2017 1359   BUN 16 09/09/2017 1359   CREATININE 0.81 09/09/2017 1359   CREATININE 0.59 (L) 06/24/2016 0853   CALCIUM 9.6 09/09/2017 1359   GFRNONAA >60 09/09/2017 1359   GFRNONAA >89 06/24/2016 0853   GFRAA >60 09/09/2017 1359   GFRAA >89 06/24/2016 0853    Lipid Panel     Component Value Date/Time   CHOL 192  07/14/2017 1456   TRIG 101.0 07/14/2017 1456   HDL 57.10 07/14/2017 1456   CHOLHDL 3 07/14/2017 1456   VLDL 20.2 07/14/2017 1456   LDLCALC 115 (H) 07/14/2017 1456    CBC    Component Value Date/Time   WBC 7.2 09/09/2017 1359   RBC 4.62 09/09/2017 1359   HGB 13.9 09/09/2017 1359   HCT 41.5 09/09/2017 1359   PLT 176 09/09/2017 1359   MCV 89.8 09/09/2017 1359   MCV 86.6 06/24/2016 0909  MCH 30.1 09/09/2017 1359   MCHC 33.5 09/09/2017 1359   RDW 13.2 09/09/2017 1359    Hgb A1C No results found for: HGBA1C         Assessment & Plan:   Epigastric Pain, GERD:  Will check CBC, CMET, Amylase, Lipase and H Pylori Take Nexium OTC x 2 weeks B[place] diet until pain resolves  Will follow up after lab work, return precautions discussed Webb Silversmith, NP

## 2018-04-21 NOTE — Patient Instructions (Signed)

## 2018-04-23 ENCOUNTER — Encounter: Payer: Self-pay | Admitting: Internal Medicine

## 2018-05-05 ENCOUNTER — Telehealth: Payer: Self-pay | Admitting: Internal Medicine

## 2018-05-05 DIAGNOSIS — R1013 Epigastric pain: Secondary | ICD-10-CM

## 2018-05-05 NOTE — Telephone Encounter (Signed)
04/21/18 result note said if pt not better will have her see GI.Please advise.

## 2018-05-05 NOTE — Telephone Encounter (Signed)
Copied from [place] [id number]. Topic: Quick Communication - See Telephone Encounter >> May 05, 2018  3:37 PM Burchel, Abbi R wrote: CRM for notification. See Telephone encounter for: 05/05/18.  Pt is still exp indigestion, heartburn and coughing spells.  Pt is taking Nexxium and she would like to know if Rollene Fare had any suggestions.  Please call pt to advise. If pt needs a referral to GI, pt would like to see Dr Benson Norway at [facility] on [address].   Pt: [phone]

## 2018-05-06 NOTE — Telephone Encounter (Signed)
Referral placed.

## 2018-05-06 NOTE — Addendum Note (Signed)
Addended by: Jearld Fenton on: 05/06/2018 01:02 PM   Modules accepted: Orders

## 2018-05-12 DIAGNOSIS — N3281 Overactive bladder: Secondary | ICD-10-CM | POA: Diagnosis not present

## 2018-05-12 DIAGNOSIS — N3941 Urge incontinence: Secondary | ICD-10-CM | POA: Diagnosis not present

## 2018-05-19 DIAGNOSIS — N3941 Urge incontinence: Secondary | ICD-10-CM | POA: Diagnosis not present

## 2018-05-20 DIAGNOSIS — K219 Gastro-esophageal reflux disease without esophagitis: Secondary | ICD-10-CM | POA: Diagnosis not present

## 2018-05-20 DIAGNOSIS — R1013 Epigastric pain: Secondary | ICD-10-CM | POA: Diagnosis not present

## 2018-05-20 DIAGNOSIS — R079 Chest pain, unspecified: Secondary | ICD-10-CM | POA: Diagnosis not present

## 2018-05-26 DIAGNOSIS — N3941 Urge incontinence: Secondary | ICD-10-CM | POA: Diagnosis not present

## 2018-06-02 DIAGNOSIS — N3941 Urge incontinence: Secondary | ICD-10-CM | POA: Diagnosis not present

## 2018-06-03 ENCOUNTER — Ambulatory Visit (INDEPENDENT_AMBULATORY_CARE_PROVIDER_SITE_OTHER): Payer: PPO | Admitting: Orthopaedic Surgery

## 2018-06-04 DIAGNOSIS — K2970 Gastritis, unspecified, without bleeding: Secondary | ICD-10-CM | POA: Diagnosis not present

## 2018-06-04 DIAGNOSIS — K259 Gastric ulcer, unspecified as acute or chronic, without hemorrhage or perforation: Secondary | ICD-10-CM | POA: Diagnosis not present

## 2018-06-04 DIAGNOSIS — R1013 Epigastric pain: Secondary | ICD-10-CM | POA: Diagnosis not present

## 2018-06-04 DIAGNOSIS — K319 Disease of stomach and duodenum, unspecified: Secondary | ICD-10-CM | POA: Diagnosis not present

## 2018-06-09 DIAGNOSIS — N3941 Urge incontinence: Secondary | ICD-10-CM | POA: Diagnosis not present

## 2018-06-11 ENCOUNTER — Other Ambulatory Visit: Payer: Self-pay | Admitting: Gastroenterology

## 2018-06-11 DIAGNOSIS — R1013 Epigastric pain: Secondary | ICD-10-CM

## 2018-06-15 DIAGNOSIS — K1370 Unspecified lesions of oral mucosa: Secondary | ICD-10-CM | POA: Diagnosis not present

## 2018-06-15 DIAGNOSIS — Z85819 Personal history of malignant neoplasm of unspecified site of lip, oral cavity, and pharynx: Secondary | ICD-10-CM | POA: Diagnosis not present

## 2018-06-16 DIAGNOSIS — N3941 Urge incontinence: Secondary | ICD-10-CM | POA: Diagnosis not present

## 2018-06-23 DIAGNOSIS — N3941 Urge incontinence: Secondary | ICD-10-CM | POA: Diagnosis not present

## 2018-06-26 ENCOUNTER — Ambulatory Visit
Admission: RE | Admit: 2018-06-26 | Discharge: 2018-06-26 | Disposition: A | Discharge status: Home / Self Care | Payer: PPO | Source: Ambulatory Visit | Attending: Gastroenterology | Admitting: Gastroenterology

## 2018-06-26 DIAGNOSIS — R1013 Epigastric pain: Secondary | ICD-10-CM | POA: Insufficient documentation

## 2018-06-26 DIAGNOSIS — K7689 Other specified diseases of liver: Secondary | ICD-10-CM | POA: Diagnosis not present

## 2018-06-26 DIAGNOSIS — R932 Abnormal findings on diagnostic imaging of liver and biliary tract: Secondary | ICD-10-CM | POA: Insufficient documentation

## 2018-06-30 DIAGNOSIS — N3941 Urge incontinence: Secondary | ICD-10-CM | POA: Diagnosis not present

## 2018-07-01 DIAGNOSIS — K219 Gastro-esophageal reflux disease without esophagitis: Secondary | ICD-10-CM | POA: Diagnosis not present

## 2018-07-01 DIAGNOSIS — R05 Cough: Secondary | ICD-10-CM | POA: Diagnosis not present

## 2018-07-07 DIAGNOSIS — N3941 Urge incontinence: Secondary | ICD-10-CM | POA: Diagnosis not present

## 2018-07-14 DIAGNOSIS — N3946 Mixed incontinence: Secondary | ICD-10-CM | POA: Diagnosis not present

## 2018-07-14 DIAGNOSIS — N3941 Urge incontinence: Secondary | ICD-10-CM | POA: Diagnosis not present

## 2018-07-16 ENCOUNTER — Encounter: Payer: Self-pay | Admitting: Internal Medicine

## 2018-07-16 ENCOUNTER — Ambulatory Visit (INDEPENDENT_AMBULATORY_CARE_PROVIDER_SITE_OTHER): Payer: PPO | Admitting: Internal Medicine

## 2018-07-16 VITALS — BP 132/82 | HR 70 | Temp 97.6°F | Ht 62.0 in | Wt 175.0 lb

## 2018-07-16 DIAGNOSIS — Z23 Encounter for immunization: Secondary | ICD-10-CM

## 2018-07-16 DIAGNOSIS — Z0000 Encounter for general adult medical examination without abnormal findings: Secondary | ICD-10-CM

## 2018-07-16 DIAGNOSIS — E559 Vitamin D deficiency, unspecified: Secondary | ICD-10-CM

## 2018-07-16 LAB — COMPREHENSIVE METABOLIC PANEL
ALT: 13 U/L (ref 0–35)
AST: 17 U/L (ref 0–37)
Albumin: 4 g/dL (ref 3.5–5.2)
Alkaline Phosphatase: 60 U/L (ref 39–117)
BILIRUBIN TOTAL: 0.4 mg/dL (ref 0.2–1.2)
BUN: 16 mg/dL (ref 6–23)
CO2: 31 mEq/L (ref 19–32)
CREATININE: 0.68 mg/dL (ref 0.40–1.20)
Calcium: 9.3 mg/dL (ref 8.4–10.5)
Chloride: 103 mEq/L (ref 96–112)
GFR: 89.19 mL/min (ref >60.00)
GLUCOSE: 105 mg/dL — AB (ref 70–99)
Potassium: 4.2 mEq/L (ref 3.5–5.1)
SODIUM: 141 meq/L (ref 135–145)
Total Protein: 7 g/dL (ref 6.0–8.3)

## 2018-07-16 LAB — CBC
HCT: 43.8 % (ref 36.0–46.0)
Hemoglobin: 14.5 g/dL (ref 12.0–15.0)
MCHC: 33.2 g/dL (ref 30.0–36.0)
MCV: 89.6 fl (ref 78.0–100.0)
PLATELETS: 194 10*3/uL (ref 150.0–400.0)
RBC: 4.89 Mil/uL (ref 3.87–5.11)
RDW: 13.6 % (ref 11.5–15.5)
WBC: 6.6 10*3/uL (ref 4.0–10.5)

## 2018-07-16 LAB — LIPID PANEL
CHOL/HDL RATIO: 3
Cholesterol: 170 mg/dL (ref 0–200)
HDL: 49.4 mg/dL (ref >39.00)
LDL CALC: 90 mg/dL (ref 0–99)
NONHDL: 120.64
Triglycerides: 153 mg/dL — ABNORMAL HIGH (ref 0.0–149.0)
VLDL: 30.6 mg/dL (ref 0.0–40.0)

## 2018-07-16 LAB — VITAMIN D 25 HYDROXY (VIT D DEFICIENCY, FRACTURES): VITD: 53.61 ng/mL (ref 30.00–100.00)

## 2018-07-16 MED ORDER — ZOSTER VAC RECOMB ADJUVANTED 50 MCG/0.5ML IM SUSR: 0.5000 mL | Freq: Once | INTRAMUSCULAR | 1 refills | Status: AC

## 2018-07-16 NOTE — Progress Notes (Signed)
HPI:  Pt presents to the clinic today for her Medicare Wellness Exam.  Past Medical History:  Diagnosis Date  . Arthritis   . Cancer ([place])   . History of chicken pox   . History of colon polyps   . History of mouth cancer   . Osteoarthritis of knee    Medial compartment, Right    Current Outpatient Medications  Medication Sig Dispense Refill  . aspirin EC 81 MG tablet Take by mouth.    . Calcium Carb-Cholecalciferol (CALCIUM 600/VITAMIN D3) 600-800 MG-UNIT TABS Take 1 tablet by mouth 2 (two) times daily.     . DULoxetine (CYMBALTA) 20 MG capsule TAKE 1 CAPSULE BY MOUTH EVERYDAY AT BEDTIME  11  . fluticasone (FLONASE) 50 MCG/ACT nasal spray Place 2 sprays into the nose at bedtime.     . Glucosamine-Chondroit-Vit C-Mn (GLUCOSAMINE 1500 COMPLEX) CAPS Take 1 capsule by mouth 2 (two) times daily.     . Multiple Vitamins-Minerals (MULTIVITAMIN WITH MINERALS) tablet Take 1 tablet by mouth daily.    . Turmeric 500 MG CAPS Take 1 capsule by mouth daily.     No current facility-administered medications for this visit.     No Known Allergies  No family history on file.  Social History   Socioeconomic History  . Marital status: Married    Spouse name: Not on file  . Number of children: Not on file  . Years of education: Not on file  . Highest education level: Not on file  Occupational History  . Not on file  Social Needs  . Financial resource strain: Not on file  . Food insecurity:    Worry: Not on file    Inability: Not on file  . Transportation needs:    Medical: Not on file    Non-medical: Not on file  Tobacco Use  . Smoking status: Former Smoker    Last attempt to quit: 12/28/1986    Years since quitting: 31.5  . Smokeless tobacco: Never Used  Substance and Sexual Activity  . Alcohol use: No  . Drug use: No  . Sexual activity: Not Currently  Lifestyle  . Physical activity:    Days per week: Not on file    Minutes per session: Not on file  . Stress: Not on file   Relationships  . Social connections:    Talks on phone: Not on file    Gets together: Not on file    Attends religious service: Not on file    Active member of club or organization: Not on file    Attends meetings of clubs or organizations: Not on file    Relationship status: Not on file  . Intimate partner violence:    Fear of current or ex partner: Not on file    Emotionally abused: Not on file    Physically abused: Not on file    Forced sexual activity: Not on file  Other Topics Concern  . Not on file  Social History Narrative  . Not on file    Hospitiliaztions: None  Health Maintenance:    Flu: 07/2017  Tetanus: 07/2012  Pneumovax: 07/2007  Prevnar: 07/2014  Zostavax: 07/2008  Mammogram: 07/2017, not scheduled this year  Pap Smear: no longer screening  Bone Density: 08/2016  Colon Screening: 10/2009  Eye Doctor: annually  Dental Exam: annually   Providers:   PCP: Webb Silversmith, NP-C  ENT: Dr. Wilburn Cornelia  Orthopedist: Dr. Carlis Abbott  GI: Dr. Collene Mares  Urologist: Dr. Junious Silk  I have personally reviewed and have noted:  1. The patient's medical and social history 2. Their use of alcohol, tobacco or illicit drugs 3. Their current medications and supplements 4. The patient's functional ability including ADL's, fall risks, home safety risks and hearing or visual impairment. 5. Diet and physical activities 6. Evidence for depression or mood disorder  Subjective:   Review of Systems:   Constitutional: Denies fever, malaise, fatigue, headache or abrupt weight changes.  HEENT: Denies eye pain, eye redness, ear pain, ringing in the ears, wax buildup, runny nose, nasal congestion, bloody nose, or sore throat. Respiratory: Pt reports intermittent cough. Denies difficulty breathing, shortness of breath, cough or sputum production.   Cardiovascular: Denies chest pain, chest tightness, palpitations or swelling in the hands or feet.  Gastrointestinal: Pt reports reflux. Denies  abdominal pain, bloating, constipation, diarrhea or blood in the stool.  GU: Denies urgency, frequency, pain with urination, burning sensation, blood in urine, odor or discharge. Musculoskeletal: Pt reports intermittent knee pain. Denies decrease in range of motion, difficulty with gait, muscle pain or joint swelling.  Skin: Denies redness, rashes, lesions or ulcercations.  Neurological: Denies dizziness, difficulty with memory, difficulty with speech or problems with balance and coordination.  Psych: Denies anxiety, depression, SI/HI.  No other specific complaints in a complete review of systems (except as listed in HPI above).  Objective:  PE:   BP 132/82   Pulse 70   Temp 97.6 F (36.4 C) (Oral)   Ht 5\' 2"  (1.575 m)   Wt 175 lb (79.4 kg)   SpO2 97%   BMI 32.01 kg/m   Wt Readings from Last 3 Encounters:  04/21/18 172 lb (78 kg)  01/19/18 167 lb 8 oz (76 kg)  11/21/17 167 lb 8 oz (76 kg)    General: Appears her stated age, well developed, well nourished in NAD. Skin: Warm, dry and intact.  HEENT: Head: normal shape and size; Eyes: sclera white, no icterus, conjunctiva pink, PERRLA and EOMs intact; Ears: bilateral cerumen impaction; Throat/Mouth: Teeth present, mucosa pink and moist, no exudate, lesions or ulcerations noted.  Neck: Neck supple, trachea midline. No masses, lumps or thyromegaly present.  Cardiovascular: Normal rate and rhythm. S1,S2 noted.  No murmur, rubs or gallops noted. No JVD or BLE edema. No carotid bruits noted. Pulmonary/Chest: Normal effort and positive vesicular breath sounds. No respiratory distress. No wheezes, rales or ronchi noted.  Abdomen: Soft and nontender. Normal bowel sounds. No distention or masses noted. Liver, spleen and kidneys non palpable. Musculoskeletal:  Strength 5/5 BUE/BLE. No signs of joint swelling.  Neurological: Alert and oriented. Cranial nerves II-XII grossly intact. Coordination normal.  Psychiatric: Mood and affect normal.  Behavior is normal. Judgment and thought content normal.   EKG:  BMET    Component Value Date/Time   NA 140 04/21/2018 0831   K 4.5 04/21/2018 0831   CL 101 04/21/2018 0831   CO2 32 04/21/2018 0831   GLUCOSE 108 (H) 04/21/2018 0831   BUN 10 04/21/2018 0831   CREATININE 0.70 04/21/2018 0831   CREATININE 0.59 (L) 06/24/2016 0853   CALCIUM 9.7 04/21/2018 0831   GFRNONAA >60 09/09/2017 1359   GFRNONAA >89 06/24/2016 0853   GFRAA >60 09/09/2017 1359   GFRAA >89 06/24/2016 0853    Lipid Panel     Component Value Date/Time   CHOL 192 07/14/2017 1456   TRIG 101.0 07/14/2017 1456   HDL 57.10 07/14/2017 1456   CHOLHDL 3 07/14/2017 1456   VLDL 20.2  07/14/2017 1456   LDLCALC 115 (H) 07/14/2017 1456    CBC    Component Value Date/Time   WBC 5.9 04/21/2018 0831   RBC 4.74 04/21/2018 0831   HGB 14.3 04/21/2018 0831   HCT 42.2 04/21/2018 0831   PLT 198.0 04/21/2018 0831   MCV 89.1 04/21/2018 0831   MCV 86.6 06/24/2016 0909   MCH 30.1 09/09/2017 1359   MCHC 34.0 04/21/2018 0831   RDW 13.3 04/21/2018 0831    Hgb A1C No results found for: HGBA1C    Assessment and Plan:   Medicare Annual Wellness Visit:  Diet: She does eat some meat. She consumes fruits and veggies daily. She tries to avoid fried foods. She drinks mostly water, some coffee, rare soda. Physical activity: Walking Depression/mood screen: Negative Hearing: Intact to whispered voice Visual acuity: Grossly normal, performs annual eye exam  ADLs: Capable Fall risk: None Home safety: Good Cognitive evaluation: Intact to orientation, naming, recall and repetition EOL planning: Adv directives, full code/ I agree  Preventative Medicine: Flu shot today. Tetanus, pneumovax, prevnar and zostavax UTD. RX for shingrix sent to pharmacy. She will call to schedule her mammogram. She no longer wants pap smears. Bone density and colon screening UTD. Encouraged her to consume a balanced diet and exercise regimen. Advised  her to see an eye doctor and dentist annually. Will check CBC, CMET, Lipid and Vit D today.   Next appointment: RTC in 1 year, sooner if needed   Webb Silversmith, NP

## 2018-07-16 NOTE — Patient Instructions (Signed)
Health Maintenance for Postmenopausal Women Menopause is a normal process in which your reproductive ability comes to an end. This process happens gradually over a span of months to years, usually between the ages of 22 and 9. Menopause is complete when you have missed 12 consecutive menstrual periods. It is important to talk with your health care provider about some of the most common conditions that affect postmenopausal women, such as heart disease, cancer, and bone loss (osteoporosis). Adopting a healthy lifestyle and getting preventive care can help to promote your health and wellness. Those actions can also lower your chances of developing some of these common conditions. What should I know about menopause? During menopause, you may experience a number of symptoms, such as:  Moderate-to-severe hot flashes.  Night sweats.  Decrease in sex drive.  Mood swings.  Headaches.  Tiredness.  Irritability.  Memory problems.  Insomnia.  Choosing to treat or not to treat menopausal changes is an individual decision that you make with your health care provider. What should I know about hormone replacement therapy and supplements? Hormone therapy products are effective for treating symptoms that are associated with menopause, such as hot flashes and night sweats. Hormone replacement carries certain risks, especially as you become older. If you are thinking about using estrogen or estrogen with progestin treatments, discuss the benefits and risks with your health care provider. What should I know about heart disease and stroke? Heart disease, heart attack, and stroke become more likely as you age. This may be due, in part, to the hormonal changes that your body experiences during menopause. These can affect how your body processes dietary fats, triglycerides, and cholesterol. Heart attack and stroke are both medical emergencies. There are many things that you can do to help prevent heart disease  and stroke:  Have your blood pressure checked at least every 1-2 years. High blood pressure causes heart disease and increases the risk of stroke.  If you are 53-22 years old, ask your health care provider if you should take aspirin to prevent a heart attack or a stroke.  Do not use any tobacco products, including cigarettes, chewing tobacco, or electronic cigarettes. If you need help quitting, ask your health care provider.  It is important to eat a healthy diet and maintain a healthy weight. ? Be sure to include plenty of vegetables, fruits, low-fat dairy products, and lean protein. ? Avoid eating foods that are high in solid fats, added sugars, or salt (sodium).  Get regular exercise. This is one of the most important things that you can do for your health. ? Try to exercise for at least 150 minutes each week. The type of exercise that you do should increase your heart rate and make you sweat. This is known as moderate-intensity exercise. ? Try to do strengthening exercises at least twice each week. Do these in addition to the moderate-intensity exercise.  Know your numbers.Ask your health care provider to check your cholesterol and your blood glucose. Continue to have your blood tested as directed by your health care provider.  What should I know about cancer screening? There are several types of cancer. Take the following steps to reduce your risk and to catch any cancer development as early as possible. Breast Cancer  Practice breast self-awareness. ? This means understanding how your breasts normally appear and feel. ? It also means doing regular breast self-exams. Let your health care provider know about any changes, no matter how small.  If you are 40  or older, have a clinician do a breast exam (clinical breast exam or CBE) every year. Depending on your age, family history, and medical history, it may be recommended that you also have a yearly breast X-ray (mammogram).  If you  have a family history of breast cancer, talk with your health care provider about genetic screening.  If you are at high risk for breast cancer, talk with your health care provider about having an MRI and a mammogram every year.  Breast cancer (BRCA) gene test is recommended for women who have family members with BRCA-related cancers. Results of the assessment will determine the need for genetic counseling and BRCA1 and for BRCA2 testing. BRCA-related cancers include these types: ? Breast. This occurs in males or females. ? Ovarian. ? Tubal. This may also be called fallopian tube cancer. ? Cancer of the abdominal or pelvic lining (peritoneal cancer). ? Prostate. ? Pancreatic.  Cervical, Uterine, and Ovarian Cancer Your health care provider may recommend that you be screened regularly for cancer of the pelvic organs. These include your ovaries, uterus, and vagina. This screening involves a pelvic exam, which includes checking for microscopic changes to the surface of your cervix (Pap test).  For women ages 21-65, health care providers may recommend a pelvic exam and a Pap test every three years. For women ages 79-65, they may recommend the Pap test and pelvic exam, combined with testing for human papilloma virus (HPV), every five years. Some types of HPV increase your risk of cervical cancer. Testing for HPV may also be done on women of any age who have unclear Pap test results.  Other health care providers may not recommend any screening for nonpregnant women who are considered low risk for pelvic cancer and have no symptoms. Ask your health care provider if a screening pelvic exam is right for you.  If you have had past treatment for cervical cancer or a condition that could lead to cancer, you need Pap tests and screening for cancer for at least 20 years after your treatment. If Pap tests have been discontinued for you, your risk factors (such as having a new sexual partner) need to be  reassessed to determine if you should start having screenings again. Some women have medical problems that increase the chance of getting cervical cancer. In these cases, your health care provider may recommend that you have screening and Pap tests more often.  If you have a family history of uterine cancer or ovarian cancer, talk with your health care provider about genetic screening.  If you have vaginal bleeding after reaching menopause, tell your health care provider.  There are currently no reliable tests available to screen for ovarian cancer.  Lung Cancer Lung cancer screening is recommended for adults 69-62 years old who are at high risk for lung cancer because of a history of smoking. A yearly low-dose CT scan of the lungs is recommended if you:  Currently smoke.  Have a history of at least 30 pack-years of smoking and you currently smoke or have quit within the past 15 years. A pack-year is smoking an average of one pack of cigarettes per day for one year.  Yearly screening should:  Continue until it has been 15 years since you quit.  Stop if you develop a health problem that would prevent you from having lung cancer treatment.  Colorectal Cancer  This type of cancer can be detected and can often be prevented.  Routine colorectal cancer screening usually begins at  age 42 and continues through age 45.  If you have risk factors for colon cancer, your health care provider may recommend that you be screened at an earlier age.  If you have a family history of colorectal cancer, talk with your health care provider about genetic screening.  Your health care provider may also recommend using home test kits to check for hidden blood in your stool.  A small camera at the end of a tube can be used to examine your colon directly (sigmoidoscopy or colonoscopy). This is done to check for the earliest forms of colorectal cancer.  Direct examination of the colon should be repeated every  5-10 years until age 71. However, if early forms of precancerous polyps or small growths are found or if you have a family history or genetic risk for colorectal cancer, you may need to be screened more often.  Skin Cancer  Check your skin from head to toe regularly.  Monitor any moles. Be sure to tell your health care provider: ? About any new moles or changes in moles, especially if there is a change in a mole's shape or color. ? If you have a mole that is larger than the size of a pencil eraser.  If any of your family members has a history of skin cancer, especially at a young age, talk with your health care provider about genetic screening.  Always use sunscreen. Apply sunscreen liberally and repeatedly throughout the day.  Whenever you are outside, protect yourself by wearing long sleeves, pants, a wide-brimmed hat, and sunglasses.  What should I know about osteoporosis? Osteoporosis is a condition in which bone destruction happens more quickly than new bone creation. After menopause, you may be at an increased risk for osteoporosis. To help prevent osteoporosis or the bone fractures that can happen because of osteoporosis, the following is recommended:  If you are 46-71 years old, get at least 1,000 mg of calcium and at least 600 mg of vitamin D per day.  If you are older than age 55 but younger than age 65, get at least 1,200 mg of calcium and at least 600 mg of vitamin D per day.  If you are older than age 54, get at least 1,200 mg of calcium and at least 800 mg of vitamin D per day.  Smoking and excessive alcohol intake increase the risk of osteoporosis. Eat foods that are rich in calcium and vitamin D, and do weight-bearing exercises several times each week as directed by your health care provider. What should I know about how menopause affects my mental health? Depression may occur at any age, but it is more common as you become older. Common symptoms of depression  include:  Low or sad mood.  Changes in sleep patterns.  Changes in appetite or eating patterns.  Feeling an overall lack of motivation or enjoyment of activities that you previously enjoyed.  Frequent crying spells.  Talk with your health care provider if you think that you are experiencing depression. What should I know about immunizations? It is important that you get and maintain your immunizations. These include:  Tetanus, diphtheria, and pertussis (Tdap) booster vaccine.  Influenza every year before the flu season begins.  Pneumonia vaccine.  Shingles vaccine.  Your health care provider may also recommend other immunizations. This information is not intended to replace advice given to you by your health care provider. Make sure you discuss any questions you have with your health care provider. Document Released: 11/15/2005  Document Revised: 04/12/2016 Document Reviewed: 06/27/2015 Elsevier Interactive Patient Education  2018 [employer]  

## 2018-07-20 ENCOUNTER — Other Ambulatory Visit: Payer: Self-pay | Admitting: Obstetrics and Gynecology

## 2018-07-20 DIAGNOSIS — Z1231 Encounter for screening mammogram for malignant neoplasm of breast: Secondary | ICD-10-CM

## 2018-07-21 ENCOUNTER — Encounter: Payer: Self-pay | Admitting: Internal Medicine

## 2018-07-21 DIAGNOSIS — N3941 Urge incontinence: Secondary | ICD-10-CM | POA: Diagnosis not present

## 2018-07-28 DIAGNOSIS — N3941 Urge incontinence: Secondary | ICD-10-CM | POA: Diagnosis not present

## 2018-08-13 ENCOUNTER — Encounter (INDEPENDENT_AMBULATORY_CARE_PROVIDER_SITE_OTHER): Payer: Self-pay

## 2018-08-14 ENCOUNTER — Encounter (INDEPENDENT_AMBULATORY_CARE_PROVIDER_SITE_OTHER): Payer: Self-pay | Admitting: Family Medicine

## 2018-08-14 ENCOUNTER — Ambulatory Visit (INDEPENDENT_AMBULATORY_CARE_PROVIDER_SITE_OTHER): Payer: PPO

## 2018-08-14 ENCOUNTER — Ambulatory Visit (INDEPENDENT_AMBULATORY_CARE_PROVIDER_SITE_OTHER): Payer: PPO | Admitting: Family Medicine

## 2018-08-14 DIAGNOSIS — G8929 Other chronic pain: Secondary | ICD-10-CM

## 2018-08-14 DIAGNOSIS — Z96651 Presence of right artificial knee joint: Secondary | ICD-10-CM

## 2018-08-14 DIAGNOSIS — M25561 Pain in right knee: Secondary | ICD-10-CM

## 2018-08-14 NOTE — Progress Notes (Signed)
Office Visit Note   Patient: [name]           Date of Birth: 04-Oct-1941           MRN: [id number] Visit Date: 08/14/2018 Requested by: Jearld Fenton, NP [place], [place] [address] PCP: Jearld Fenton, NP  Subjective: Chief Complaint  Patient presents with  . Right Knee - Pain    Lateral knee pain - tender to touch - x approx. 2 weeks.  Occasionally "acts like it wants to give way."  No known injury.    HPI: She is here with right knee pain.  She is about 11 months status post unicompartmental replacement.  About a month ago she started noticing pain on the lateral aspect with a sensation that it was going to give way.  She could not get in right away for an appointment, but after a few days her symptoms improved but then in the last week it has happened again.  Sharp pain along with a sensation that it will buckle, but she has not fallen.              ROS: Noncontributory  Objective: Vital Signs: There were no vitals taken for this visit.  Physical Exam:  Right knee: No significant effusion, full extension and flexion of about 125 degrees.  There is some crepitus near the distal IT band as it crosses the femoral condyle.  She also has some lateral joint line tenderness and pain with McMurray's but no palpable click.  Imaging: Right knee x-rays: Prosthesis is intact with no sign of loosening.  She has mild to moderate lateral compartment degenerative change.  Assessment & Plan: 1.  Right lateral knee pain, possibilities include distal IT band friction syndrome versus lateral meniscus tear -Topical diclofenac, ice applications, lateral leg raises for strengthening.  If symptoms persist she will return to see [name] for possible lateral meniscus treatment.   Follow-Up Instructions: No follow-ups on file.      Procedures: No procedures performed  No notes on file    PMFS History: Patient Active Problem List   Diagnosis Date Noted  .  Acid reflux 04/06/2018  . 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 Epistaxis, recurrent 11/08/2016  . Oral lesion 09/02/2016  . Degenerative arthritis of knee, bilateral 07/11/2016  . 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 Allergic rhinitis 12/22/2011   Past Medical History:  Diagnosis Date  . Arthritis   . Cancer ([place])   . History of chicken pox   . History of colon polyps   . History of mouth cancer   . Osteoarthritis of knee    Medial compartment, Right    History reviewed. No pertinent family history.  Past Surgical History:  Procedure Laterality Date  . APPENDECTOMY    . DILATION AND CURETTAGE OF UTERUS    . 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 KNEE ARTHROSCOPY W/ MENISCAL REPAIR Left   . PARTIAL KNEE ARTHROPLASTY Right 09/16/2017  . PARTIAL KNEE ARTHROPLASTY Right 09/16/2017   Procedure: RIGHT UNICOMPARTMENTAL KNEE;  Surgeon: Mcarthur Rossetti, MD;  Location: [facility];  Service: Orthopedics;  Laterality: Right;  . TUBAL LIGATION     Social History   Occupational History  . Not on file  Tobacco Use  . Smoking status: Former Smoker    Last attempt to quit: 12/28/1986    Years since quitting: 31.6  . Smokeless tobacco: Never Used  Substance and Sexual  Activity  . Alcohol use: No  . Drug use: No  . Sexual activity: Not Currently

## 2018-08-18 DIAGNOSIS — N3941 Urge incontinence: Secondary | ICD-10-CM | POA: Diagnosis not present

## 2018-08-21 ENCOUNTER — Ambulatory Visit
Admission: RE | Admit: 2018-08-21 | Discharge: 2018-08-21 | Disposition: A | Discharge status: Home / Self Care | Payer: PPO | Source: Ambulatory Visit | Attending: Obstetrics and Gynecology | Admitting: Obstetrics and Gynecology

## 2018-08-21 DIAGNOSIS — Z1231 Encounter for screening mammogram for malignant neoplasm of breast: Secondary | ICD-10-CM

## 2018-08-31 DIAGNOSIS — R05 Cough: Secondary | ICD-10-CM | POA: Diagnosis not present

## 2018-08-31 DIAGNOSIS — K219 Gastro-esophageal reflux disease without esophagitis: Secondary | ICD-10-CM | POA: Diagnosis not present

## 2018-09-08 DIAGNOSIS — N3941 Urge incontinence: Secondary | ICD-10-CM | POA: Diagnosis not present

## 2018-09-21 ENCOUNTER — Encounter (INDEPENDENT_AMBULATORY_CARE_PROVIDER_SITE_OTHER): Payer: Self-pay | Admitting: Orthopaedic Surgery

## 2018-09-21 ENCOUNTER — Ambulatory Visit (INDEPENDENT_AMBULATORY_CARE_PROVIDER_SITE_OTHER): Payer: PPO

## 2018-09-21 ENCOUNTER — Ambulatory Visit (INDEPENDENT_AMBULATORY_CARE_PROVIDER_SITE_OTHER): Payer: PPO | Admitting: Orthopaedic Surgery

## 2018-09-21 DIAGNOSIS — M25512 Pain in left shoulder: Secondary | ICD-10-CM

## 2018-09-21 DIAGNOSIS — G8929 Other chronic pain: Secondary | ICD-10-CM | POA: Diagnosis not present

## 2018-09-21 DIAGNOSIS — Z96651 Presence of right artificial knee joint: Secondary | ICD-10-CM

## 2018-09-21 DIAGNOSIS — M25561 Pain in right knee: Secondary | ICD-10-CM

## 2018-09-21 MED ORDER — LIDOCAINE HCL 1 % IJ SOLN
3.0000 mL | INTRAMUSCULAR | Status: AC | PRN
  Administered 2018-09-21: 3 mL

## 2018-09-21 MED ORDER — METHYLPREDNISOLONE ACETATE 40 MG/ML IJ SUSP
40.0000 mg | INTRAMUSCULAR | Status: AC | PRN
  Administered 2018-09-21: 40 mg via INTRA_ARTICULAR

## 2018-09-21 NOTE — Progress Notes (Signed)
Office Visit Note   Patient: ROMANDA TURRUBIATES           Date of Birth: 03-Jun-1941           MRN: [id number] Visit Date: 09/21/2018              Requested by: Jearld Fenton, NP [address] [place], [place] [address] PCP: Jearld Fenton, NP   Assessment & Plan: Visit Diagnoses:  1. Chronic left shoulder pain   2. Chronic pain of right knee   3. S/P right unicompartmental knee replacement     Plan: She is been to work aggressively on her left shoulder motion.  I did provide a steroid injection subacromial space which she tolerated well.  She is not a diabetic.  I did talk to her about trying a one-time intra-articular injection in her right knee and she agrees with this and tolerated well.  We will see her back in about 6 weeks to reassess her left shoulder.  All question concerns were answered and addressed.  Follow-Up Instructions: Return in about 6 weeks (around 11/02/2018).   Orders:  Orders Placed This Encounter  Procedures  . Large Joint Inj  . Large Joint Inj  . XR Shoulder Left   No orders of the defined types were placed in this encounter.     Procedures: Large Joint Inj: R knee on 09/21/2018 10:37 AM Indications: diagnostic evaluation and pain Details: 22 G 1.5 in needle, superolateral approach  Arthrogram: No  Medications: 3 mL lidocaine 1 %; 40 mg methylPREDNISolone acetate 40 MG/ML Outcome: tolerated well, no immediate complications Procedure, treatment alternatives, risks and benefits explained, specific risks discussed. Consent was given by the patient. Immediately prior to procedure a time out was called to verify the correct patient, procedure, equipment, support staff and site/side marked as required. Patient was prepped and draped in the usual sterile fashion.   Large Joint Inj: L subacromial bursa on 09/21/2018 10:37 AM Indications: pain and diagnostic evaluation Details: 22 G 1.5 in needle  Arthrogram: No  Medications: 3 mL lidocaine 1 %;  40 mg methylPREDNISolone acetate 40 MG/ML Outcome: tolerated well, no immediate complications Procedure, treatment alternatives, risks and benefits explained, specific risks discussed. Consent was given by the patient. Immediately prior to procedure a time out was called to verify the correct patient, procedure, equipment, support staff and site/side marked as required. Patient was prepped and draped in the usual sterile fashion.       Clinical Data: No additional findings.   Subjective: Chief Complaint  Patient presents with  . Right Knee - Follow-up  The patient comes in today 1 year out from a right partial knee arthroplasty.  She actually returns 2 months.  She would only do x-rays at today.  Her biggest complaint is pain with overhead activities and reaching behind her with her left shoulder.  She denies any injuries but is been slowly getting worse with time and reaching overhead and reaching behind her and has become more painful.  She denies any numbness and tingling in her left hand.  She denies any neck issues.  She said the right knee is been doing okay overall but she points the medial joint line as source of some tenderness and some lateral tenderness.  HPI  Review of Systems She currently denies any headache, chest pain, shortness of breath, fever, chills, nausea, vomiting.  Objective: Vital Signs: There were no vitals taken for this visit.  Physical Exam She  is alert and oriented x3 and in no acute distress Ortho Exam Examination of her right knee shows that is ligamentously stable.  She has a well-healed surgical incision medially.  She has good range of motion of that knee and no effusion.  Examination of her left shoulder shows positive Neer and Hawkins signs.  Her internal rotation with adduction is limited and painful.  Rotator cuff seems to be functioning appropriately. Specialty Comments:  No specialty comments available.  Imaging: Xr Shoulder Left  Result  Date: 09/21/2018 3 views of the left shoulder show significant arthritis at the acromioclavicular joint.  The glenohumeral joint is well located and there is adequate space in the subacromial outlet.  There is otherwise no acute findings.  I did review x-rays of her right knee from 2 months ago and it showed a unicompartmental partial knee arthroplasty of medial compartment without complicating features.  PMFS History: Patient Active Problem List   Diagnosis Date Noted  . Acid reflux 04/06/2018  . 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 Epistaxis, recurrent 11/08/2016  . Oral lesion 09/02/2016  . Degenerative arthritis of knee, bilateral 07/11/2016  . 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 Allergic rhinitis 12/22/2011   Past Medical History:  Diagnosis Date  . Arthritis   . Cancer ([place])   . History of chicken pox   . History of colon polyps   . History of mouth cancer   . Osteoarthritis of knee    Medial compartment, Right    History reviewed. No pertinent family history.  Past Surgical History:  Procedure Laterality Date  . APPENDECTOMY    . DILATION AND CURETTAGE OF UTERUS    . 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 KNEE ARTHROSCOPY W/ MENISCAL REPAIR Left   . PARTIAL KNEE ARTHROPLASTY Right 09/16/2017  . PARTIAL KNEE ARTHROPLASTY Right 09/16/2017   Procedure: RIGHT UNICOMPARTMENTAL KNEE;  Surgeon: Mcarthur Rossetti, MD;  Location: [facility];  Service: Orthopedics;  Laterality: Right;  . TUBAL LIGATION     Social History   Occupational History  . Not on file  Tobacco Use  . Smoking status: Former Smoker    Last attempt to quit: 12/28/1986    Years since quitting: 31.7  . Smokeless tobacco: Never Used  Substance and Sexual Activity  . Alcohol use: No  . Drug use: No  . Sexual activity: Not Currently

## 2018-10-06 ENCOUNTER — Ambulatory Visit (INDEPENDENT_AMBULATORY_CARE_PROVIDER_SITE_OTHER): Payer: PPO | Admitting: Internal Medicine

## 2018-10-06 ENCOUNTER — Encounter: Payer: Self-pay | Admitting: Internal Medicine

## 2018-10-06 VITALS — BP 138/80 | HR 85 | Temp 97.7°F | Wt 171.0 lb

## 2018-10-06 DIAGNOSIS — J3089 Other allergic rhinitis: Secondary | ICD-10-CM | POA: Diagnosis not present

## 2018-10-06 MED ORDER — HYDROCOD POLST-CPM POLST ER 10-8 MG/5ML PO SUER: 5.0000 mL | Freq: Every evening | ORAL | 0 refills | Status: DC | PRN

## 2018-10-06 NOTE — Progress Notes (Signed)
HPI  Pt presents to the clinic today with c/o runny nose and cough. She reports this started 2 weeks ago. She is blowing clear mucous out of her nose. The cough is non productive. She denies nasal congestion, ear pain, sore throat or shortness of breath. She denies fever, chills or body aches. She has tried Robitussin, Flonase and Coricidin with minimal relief. She has had sick contacts.  Review of Systems      Past Medical History:  Diagnosis Date  . Arthritis   . Cancer ([place])   . History of chicken pox   . History of colon polyps   . History of mouth cancer   . Osteoarthritis of knee    Medial compartment, Right    No family history on file.  Social History   Socioeconomic History  . Marital status: Married    Spouse name: Not on file  . Number of children: Not on file  . Years of education: Not on file  . Highest education level: Not on file  Occupational History  . Not on file  Social Needs  . Financial resource strain: Not on file  . Food insecurity:    Worry: Not on file    Inability: Not on file  . Transportation needs:    Medical: Not on file    Non-medical: Not on file  Tobacco Use  . Smoking status: Former Smoker    Last attempt to quit: 12/28/1986    Years since quitting: 31.7  . Smokeless tobacco: Never Used  Substance and Sexual Activity  . Alcohol use: No  . Drug use: No  . Sexual activity: Not Currently  Lifestyle  . Physical activity:    Days per week: Not on file    Minutes per session: Not on file  . Stress: Not on file  Relationships  . Social connections:    Talks on phone: Not on file    Gets together: Not on file    Attends religious service: Not on file    Active member of club or organization: Not on file    Attends meetings of clubs or organizations: Not on file    Relationship status: Not on file  . Intimate partner violence:    Fear of current or ex partner: Not on file    Emotionally abused: Not on file    Physically abused:  Not on file    Forced sexual activity: Not on file  Other Topics Concern  . Not on file  Social History Narrative  . Not on file    No Known Allergies   Constitutional: Denies headache, fatigue, fever or abrupt weight changes.  HEENT:  Positive runny nose. Denies eye redness, eye pain, pressure behind the eyes, facial pain, nasal congestion, ear pain, ringing in the ears, wax buildup, or sore throat. Respiratory: Positive cough. Denies difficulty breathing or shortness of breath.  Cardiovascular: Denies chest pain, chest tightness, palpitations or swelling in the hands or feet.   No other specific complaints in a complete review of systems (except as listed in HPI above).  Objective:   BP 138/80   Pulse 85   Temp 97.7 F (36.5 C) (Oral)   Wt 171 lb (77.6 kg)   SpO2 96%   BMI 31.28 kg/m   Wt Readings from Last 3 Encounters:  10/06/18 171 lb (77.6 kg)  07/16/18 175 lb (79.4 kg)  04/21/18 172 lb (78 kg)     General: Appears her stated age, well developed, well  nourished in NAD. HEENT: Head: normal shape and size, no sinus tenderness noted;  Ears: bilateral cerumen impaction; Nose: mucosa pink and moist, septum midline; Throat/Mouth: + PND. Teeth present, mucosa pink and moist, no exudate noted, no lesions or ulcerations noted.  Neck: No cervical lymphadenopathy.  Cardiovascular: Normal rate and rhythm. S1,S2 noted.  No murmur, rubs or gallops noted.  Pulmonary/Chest: Normal effort and positive vesicular breath sounds. No respiratory distress. No wheezes, rales or ronchi noted.       Assessment & Plan:   Allergic Rhinitis:  Get some rest and drink plenty of water Start Zyrtec and Flonase OTC eRx for Tussionex cough syrup  RTC as needed or if symptoms persist.   Webb Silversmith, NP

## 2018-10-06 NOTE — Patient Instructions (Signed)

## 2018-10-08 ENCOUNTER — Encounter (INDEPENDENT_AMBULATORY_CARE_PROVIDER_SITE_OTHER): Payer: Self-pay | Admitting: Orthopaedic Surgery

## 2018-10-13 DIAGNOSIS — N3941 Urge incontinence: Secondary | ICD-10-CM | POA: Diagnosis not present

## 2018-10-19 ENCOUNTER — Ambulatory Visit (INDEPENDENT_AMBULATORY_CARE_PROVIDER_SITE_OTHER): Payer: PPO | Admitting: Orthopaedic Surgery

## 2018-10-19 ENCOUNTER — Encounter (INDEPENDENT_AMBULATORY_CARE_PROVIDER_SITE_OTHER): Payer: Self-pay | Admitting: Orthopaedic Surgery

## 2018-10-19 DIAGNOSIS — G8929 Other chronic pain: Secondary | ICD-10-CM | POA: Diagnosis not present

## 2018-10-19 DIAGNOSIS — Z96651 Presence of right artificial knee joint: Secondary | ICD-10-CM

## 2018-10-19 DIAGNOSIS — M25561 Pain in right knee: Secondary | ICD-10-CM | POA: Diagnosis not present

## 2018-10-19 DIAGNOSIS — M25512 Pain in left shoulder: Secondary | ICD-10-CM

## 2018-10-19 NOTE — Progress Notes (Signed)
The patient continues to complain of chronic left shoulder pain and right knee pain.  She had a partial unicompartmental knee replacement several years ago.  Since then I performed an arthroscopic intervention of that right knee still showing intact cartilage in the remainder of the knee.  Is still hurts on a daily basis.  She has had a steroid injection her left shoulder and she said this did not help her at all.  Examination left shoulder she does have positive Neer and Hawkins signs but full range of motion the shoulder.  There is some slight weakness in the rotator cuff.  X-rays previously of the shoulder show moderate AC joint degenerative changes but a well located shoulder.  X-rays in November of her right knee show a well-seated partial knee arthroplasty with no complicating features.  This point as far as her knee goes on I would also offer other than converting this to a total knee replacement.  She is going to think about this.  I would like to attain an MRI of her left shoulder rule out a rotator cuff tear and assess the degree of arthritis and synovitis she has in the shoulder as well as any evidence of impingement determine what the best treatment options will be for her.  We will see her back in 2 weeks to go over the MRI of her left shoulder and see what her thoughts will be about converting her right partial knee to a total knee.

## 2018-10-20 ENCOUNTER — Other Ambulatory Visit (INDEPENDENT_AMBULATORY_CARE_PROVIDER_SITE_OTHER): Payer: Self-pay

## 2018-10-20 DIAGNOSIS — G8929 Other chronic pain: Secondary | ICD-10-CM

## 2018-10-20 DIAGNOSIS — M25512 Pain in left shoulder: Principal | ICD-10-CM

## 2018-10-25 ENCOUNTER — Ambulatory Visit
Admission: RE | Admit: 2018-10-25 | Discharge: 2018-10-25 | Disposition: A | Discharge status: Home / Self Care | Payer: PPO | Source: Ambulatory Visit | Attending: Orthopaedic Surgery | Admitting: Orthopaedic Surgery

## 2018-10-25 DIAGNOSIS — M75112 Incomplete rotator cuff tear or rupture of left shoulder, not specified as traumatic: Secondary | ICD-10-CM | POA: Diagnosis not present

## 2018-10-25 DIAGNOSIS — M25512 Pain in left shoulder: Principal | ICD-10-CM

## 2018-10-25 DIAGNOSIS — G8929 Other chronic pain: Secondary | ICD-10-CM

## 2018-10-29 ENCOUNTER — Ambulatory Visit (INDEPENDENT_AMBULATORY_CARE_PROVIDER_SITE_OTHER): Payer: PPO | Admitting: Family Medicine

## 2018-10-29 ENCOUNTER — Encounter: Payer: Self-pay | Admitting: Family Medicine

## 2018-10-29 VITALS — BP 128/72 | HR 96 | Temp 97.3°F | Resp 10 | Ht 62.0 in | Wt 171.8 lb

## 2018-10-29 DIAGNOSIS — J4520 Mild intermittent asthma, uncomplicated: Secondary | ICD-10-CM

## 2018-10-29 MED ORDER — ALBUTEROL SULFATE HFA 108 (90 BASE) MCG/ACT IN AERS
2.0000 | INHALATION_SPRAY | Freq: Four times a day (QID) | RESPIRATORY_TRACT | 0 refills | Status: DC | PRN
Start: 2018-10-29 — End: 2019-06-15

## 2018-10-29 NOTE — Progress Notes (Signed)
Subjective:     [name] is a 78 y.o. female presenting for Cough (since 10/26/2018. Dry cough, runny nose, sneezing. No body aches, no fever. No dyspnea.)     Cough  This is a new problem. The current episode started in the past 7 days. The problem occurs every few hours. The cough is non-productive. Associated symptoms include rhinorrhea. Pertinent negatives include no chest pain, chills, ear pain, fever, myalgias, sore throat or shortness of breath. The symptoms are aggravated by lying down. She has tried prescription cough suppressant (flonase, zyrtec) for the symptoms. The treatment provided mild relief.   Left nasal passage not as open and has been using flonase for years   Review of Systems  Constitutional: Positive for fatigue. Negative for chills and fever.  HENT: Positive for rhinorrhea and sneezing. Negative for congestion, ear pain, sinus pressure, sinus pain and sore throat.   Respiratory: Positive for cough. Negative for shortness of breath.   Cardiovascular: Negative for chest pain.  Gastrointestinal: Negative for abdominal pain, nausea and vomiting.  Musculoskeletal: Negative for myalgias.    10/06/2018: Clinic - allergic rhinitis - zyrtec and flonase, tussionex for cough  Social History   Tobacco Use  Smoking Status Former Smoker  . Last attempt to quit: 12/28/1986  . Years since quitting: 31.8  Smokeless Tobacco Never Used        Objective:    BP Readings from Last 3 Encounters:  10/29/18 128/72  10/06/18 138/80  07/16/18 132/82   Wt Readings from Last 3 Encounters:  10/29/18 171 lb 12 oz (77.9 kg)  10/06/18 171 lb (77.6 kg)  07/16/18 175 lb (79.4 kg)    BP 128/72   Pulse 96   Temp (!) 97.3 F (36.3 C)   Resp 10   Ht 5\' 2"  (1.575 m)   Wt 171 lb 12 oz (77.9 kg)   SpO2 96%   BMI 31.41 kg/m    Physical Exam Constitutional:      General: She is not in acute distress.    Appearance: She is well-developed. She is not diaphoretic.    HENT:     Head: Normocephalic and atraumatic.     Right Ear: Tympanic membrane and ear canal normal.     Left Ear: Tympanic membrane and ear canal normal.     Nose: Mucosal edema and rhinorrhea present.     Right Sinus: No maxillary sinus tenderness or frontal sinus tenderness.     Left Sinus: No maxillary sinus tenderness or frontal sinus tenderness.     Mouth/Throat:     Pharynx: Uvula midline. Posterior oropharyngeal erythema present. No oropharyngeal exudate.     Tonsils: Swelling: 0 on the right. 0 on the left.  Eyes:     General: No scleral icterus.    Conjunctiva/sclera: Conjunctivae normal.  Neck:     Musculoskeletal: Neck supple.  Cardiovascular:     Rate and Rhythm: Normal rate and regular rhythm.     Heart sounds: Normal heart sounds. No murmur.  Pulmonary:     Effort: Pulmonary effort is normal. No respiratory distress.     Breath sounds: Examination of the right-upper field reveals wheezing. Examination of the left-upper field reveals wheezing. Wheezing present.  Lymphadenopathy:     Cervical: No cervical adenopathy.  Skin:    General: Skin is warm and dry.     Capillary Refill: Capillary refill takes less than 2 seconds.  Neurological:     Mental Status: She is alert.  Assessment & Plan:   Problem List Items Addressed This Visit    None    Visit Diagnoses    Mild intermittent reactive airway disease without complication    -  Primary   Relevant Medications   albuterol (PROVENTIL HFA;VENTOLIN HFA) 108 (90 Base) MCG/ACT inhaler     Suspect allergies vs viral but with wheezing.   Albuterol trial Saline rinse Honey for cough  Return if not improving  Return if symptoms worsen or fail to improve.  Lesleigh Noe, MD

## 2018-10-29 NOTE — Patient Instructions (Signed)
Likely viral.   If fever, chills, or worsening symptoms return to clinic  Can try albuterol to help during coughing fits  Also try to do saline rinse to help with congestion

## 2018-11-02 ENCOUNTER — Ambulatory Visit (INDEPENDENT_AMBULATORY_CARE_PROVIDER_SITE_OTHER): Payer: PPO | Admitting: Orthopaedic Surgery

## 2018-11-02 ENCOUNTER — Encounter (INDEPENDENT_AMBULATORY_CARE_PROVIDER_SITE_OTHER): Payer: Self-pay | Admitting: Orthopaedic Surgery

## 2018-11-02 DIAGNOSIS — M7542 Impingement syndrome of left shoulder: Secondary | ICD-10-CM | POA: Diagnosis not present

## 2018-11-02 DIAGNOSIS — M25512 Pain in left shoulder: Secondary | ICD-10-CM | POA: Diagnosis not present

## 2018-11-02 DIAGNOSIS — G8929 Other chronic pain: Secondary | ICD-10-CM | POA: Diagnosis not present

## 2018-11-02 NOTE — Progress Notes (Signed)
The patient is a very pleasant 78 year old active female well-known to me.  She has been dealing with left shoulder issues for some time now.  She is tried and failed all forms of conservative treatment including several subacromial steroid injections, anti-inflammatories, activity modification, anti-inflammatories and physical therapy.  She still has chronic left shoulder pain.  We sent her for an MRI and she is here for review of this today.  On examination of her shoulder she still shows significant signs of impingement.  She has positive Neer and Hawkins signs as well but the rotator cuff appears to be working.  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  There is moderate bulky osteoarthritis of the AC joint as well.  There is a superior labral tear and tendinosis of the biceps tendon.  Given the MRI findings I am recommending an arthroscopic intervention with subacromial decompression and extensive debridement.  I showed her a shoulder model and went over the MRI with her and explained in detail the recommendation for surgery.  She agrees with this given her shoulder pain is worsening and given the fact that all conservative treat measures and failed and she is miserable.  I explained what her intraoperative and postoperative course was involved.  All question concerns were answered and addressed.  We will work on getting this scheduled.  She is otherwise a very healthy 78 year old female and is tolerated surgery by [place] before.

## 2018-11-07 HISTORY — PX: SHOULDER ARTHROSCOPY: SHX128

## 2018-11-10 DIAGNOSIS — N3941 Urge incontinence: Secondary | ICD-10-CM | POA: Diagnosis not present

## 2018-11-12 ENCOUNTER — Other Ambulatory Visit (INDEPENDENT_AMBULATORY_CARE_PROVIDER_SITE_OTHER): Payer: Self-pay | Admitting: Orthopaedic Surgery

## 2018-11-12 DIAGNOSIS — G8918 Other acute postprocedural pain: Secondary | ICD-10-CM | POA: Diagnosis not present

## 2018-11-12 DIAGNOSIS — M7522 Bicipital tendinitis, left shoulder: Secondary | ICD-10-CM | POA: Diagnosis not present

## 2018-11-12 DIAGNOSIS — M7542 Impingement syndrome of left shoulder: Secondary | ICD-10-CM | POA: Diagnosis not present

## 2018-11-12 DIAGNOSIS — M7582 Other shoulder lesions, left shoulder: Secondary | ICD-10-CM | POA: Diagnosis not present

## 2018-11-12 MED ORDER — HYDROCODONE-ACETAMINOPHEN 5-325 MG PO TABS: 1.0000 | ORAL_TABLET | Freq: Four times a day (QID) | ORAL | 0 refills | Status: DC | PRN

## 2018-11-13 ENCOUNTER — Telehealth (INDEPENDENT_AMBULATORY_CARE_PROVIDER_SITE_OTHER): Payer: Self-pay | Admitting: Orthopaedic Surgery

## 2018-11-13 NOTE — Telephone Encounter (Signed)
I called patient and advised. 

## 2018-11-13 NOTE — Telephone Encounter (Signed)
Please advise 

## 2018-11-13 NOTE — Telephone Encounter (Signed)
Patient called stating that she can not take Advil or Ibuprofen because of her Acid Reflux and wanted to know what else she can take in between the prescription pain medication.  CB#[phone].  Thank you.

## 2018-11-13 NOTE — Telephone Encounter (Signed)
Just tylenol then.

## 2018-11-19 ENCOUNTER — Ambulatory Visit (INDEPENDENT_AMBULATORY_CARE_PROVIDER_SITE_OTHER): Payer: PPO | Admitting: Orthopaedic Surgery

## 2018-11-19 ENCOUNTER — Encounter (INDEPENDENT_AMBULATORY_CARE_PROVIDER_SITE_OTHER): Payer: Self-pay | Admitting: Orthopaedic Surgery

## 2018-11-19 DIAGNOSIS — Z9889 Other specified postprocedural states: Secondary | ICD-10-CM

## 2018-11-19 NOTE — Progress Notes (Signed)
The patient is here today at 1 week status post a left shoulder arthroscopy with subacromial decompression and extensive debridement.  She had significant inflamed tissue in her glenohumeral joint and subacromial outlet.  The rotator cuff is intact.  She is a very active 78 year old female.  She says she is tolerated the surgery well.  On exam I did remove the sutures from her left shoulder.  She does show an intact axillary nerve.  She is moving her shoulder pretty well but is still stiff.  I did offer the possibility of physical therapy but she feels like she can get moving on her own.  We will see her back in 4 weeks to see how she is doing overall.  She still complaining of her unicompartmental knee replacement and the pain she has in her knee.  She has a trip scheduled in April and we may place a steroid injection in her knee before that trip.  Regardless we will see her back in 4 weeks from now to see how she is doing overall mainly for the shoulder.

## 2018-11-26 ENCOUNTER — Encounter: Payer: Self-pay | Admitting: Internal Medicine

## 2018-11-26 ENCOUNTER — Ambulatory Visit (INDEPENDENT_AMBULATORY_CARE_PROVIDER_SITE_OTHER): Payer: PPO | Admitting: Internal Medicine

## 2018-11-26 VITALS — BP 128/84 | HR 85 | Temp 97.8°F | Wt 174.0 lb

## 2018-11-26 DIAGNOSIS — L918 Other hypertrophic disorders of the skin: Secondary | ICD-10-CM

## 2018-11-26 NOTE — Patient Instructions (Signed)
Skin Tag, Adult    A skin tag (acrochordon) is a soft, extra growth of skin. Most skin tags are flesh-colored and rarely bigger than a pencil eraser. They commonly form near areas where there are folds in the skin, such as the armpit or groin. Skin tags are not dangerous, and they do not spread from person to person (are not contagious).  You may have one skin tag or several. Skin tags do not require treatment. However, your health care provider may recommend removal of a skin tag if it:   Gets irritated from clothing.   Bleeds.   Is visible and unsightly.  Your health care provider can remove skin tags with a simple surgical procedure or a procedure that involves freezing the skin tag.  Follow these instructions at home:   Watch for any changes in your skin tag. A normal skin tag does not require any other special care at home.   Take over-the-counter and prescription medicines only as told by your health care provider.   Keep all follow-up visits as told by your health care provider. This is important.  Contact a health care provider if:   You have a skin tag that:  ? Becomes painful.  ? Changes color.  ? Bleeds.  ? Swells.   You develop more skin tags.  This information is not intended to replace advice given to you by your health care provider. Make sure you discuss any questions you have with your health care provider.  Document Released: 10/08/2015 Document Revised: 05/19/2016 Document Reviewed: 10/08/2015  [employer]vier Interactive Patient Education  2019 [employer]

## 2018-11-26 NOTE — Progress Notes (Signed)
Subjective:    Patient ID: [name], female    DOB: 01/03/1941, 78 y.o.   MRN: [id number]  HPI  Pt presents to the clinic today with c/o a skin tag under her left arm. She reports she noticed this 1 month ago. It is not painful or tender. She just wanted to have it looked at.  Review of Systems      Past Medical History:  Diagnosis Date  . Arthritis   . Cancer ([place])   . History of chicken pox   . History of colon polyps   . History of mouth cancer   . Osteoarthritis of knee    Medial compartment, Right    Current Outpatient Medications  Medication Sig Dispense Refill  . albuterol (PROVENTIL HFA;VENTOLIN HFA) 108 (90 Base) MCG/ACT inhaler Inhale 2 puffs into the lungs every 6 (six) hours as needed for wheezing or shortness of breath. 1 Inhaler 0  . aspirin EC 81 MG tablet Take by mouth.    . Calcium Carb-Cholecalciferol (CALCIUM 600/VITAMIN D3) 600-800 MG-UNIT TABS Take 1 tablet by mouth 2 (two) times daily.     . cetirizine (ZYRTEC) 10 MG tablet Take 10 mg by mouth daily.    . chlorhexidine (PERIDEX) 0.12 % solution     . DULoxetine (CYMBALTA) 20 MG capsule TAKE 1 CAPSULE BY MOUTH EVERYDAY AT BEDTIME  11  . esomeprazole (NEXIUM) 20 MG capsule     . fluticasone (FLONASE) 50 MCG/ACT nasal spray Place 2 sprays into the nose at bedtime.     . Glucosamine-Chondroit-Vit C-Mn (GLUCOSAMINE 1500 COMPLEX) CAPS Take 1 capsule by mouth 2 (two) times daily.     . Multiple Vitamins-Minerals (MULTIVITAMIN WITH MINERALS) tablet Take 1 tablet by mouth daily.    . Omega-3 Fatty Acids (FISH OIL) 500 MG CAPS Take 1 capsule by mouth daily.    . Turmeric 500 MG CAPS Take 1 capsule by mouth daily.     No current facility-administered medications for this visit.     Allergies  Allergen Reactions  . Ibuprofen     GI upset, GERD    No family history on file.  Social History   Socioeconomic History  . Marital status: Married    Spouse name: Not on file  . Number of children: Not  on file  . Years of education: Not on file  . Highest education level: Not on file  Occupational History  . Not on file  Social Needs  . Financial resource strain: Not on file  . Food insecurity:    Worry: Not on file    Inability: Not on file  . Transportation needs:    Medical: Not on file    Non-medical: Not on file  Tobacco Use  . Smoking status: Former Smoker    Last attempt to quit: 12/28/1986    Years since quitting: 31.9  . Smokeless tobacco: Never Used  Substance and Sexual Activity  . Alcohol use: No  . Drug use: No  . Sexual activity: Not Currently  Lifestyle  . Physical activity:    Days per week: Not on file    Minutes per session: Not on file  . Stress: Not on file  Relationships  . Social connections:    Talks on phone: Not on file    Gets together: Not on file    Attends religious service: Not on file    Active member of club or organization: Not on file    Attends meetings of  clubs or organizations: Not on file    Relationship status: Not on file  . Intimate partner violence:    Fear of current or ex partner: Not on file    Emotionally abused: Not on file    Physically abused: Not on file    Forced sexual activity: Not on file  Other Topics Concern  . Not on file  Social History Narrative  . Not on file     Constitutional: Denies fever, malaise, fatigue, headache or abrupt weight changes.  Skin: Pt reports skin tag under left arm. Denies redness, rashes, or ulcercations.    No other specific complaints in a complete review of systems (except as listed in HPI above).  Objective:   Physical Exam   BP 128/84   Pulse 85   Temp 97.8 F (36.6 C) (Oral)   Wt 174 lb (78.9 kg)   SpO2 96%   BMI 31.83 kg/m  Wt Readings from Last 3 Encounters:  11/26/18 174 lb (78.9 kg)  10/29/18 171 lb 12 oz (77.9 kg)  10/06/18 171 lb (77.6 kg)    General: Appears her stated age, well developed, well nourished in NAD. Skin: Warm, dry and intact. Skin tag  noted in left axilla.  BMET    Component Value Date/Time   NA 141 07/16/2018 0841   K 4.2 07/16/2018 0841   CL 103 07/16/2018 0841   CO2 31 07/16/2018 0841   GLUCOSE 105 (H) 07/16/2018 0841   BUN 16 07/16/2018 0841   CREATININE 0.68 07/16/2018 0841   CREATININE 0.59 (L) 06/24/2016 0853   CALCIUM 9.3 07/16/2018 0841   GFRNONAA >60 09/09/2017 1359   GFRNONAA >89 06/24/2016 0853   GFRAA >60 09/09/2017 1359   GFRAA >89 06/24/2016 0853    Lipid Panel     Component Value Date/Time   CHOL 170 07/16/2018 0841   TRIG 153.0 (H) 07/16/2018 0841   HDL 49.40 07/16/2018 0841   CHOLHDL 3 07/16/2018 0841   VLDL 30.6 07/16/2018 0841   LDLCALC 90 07/16/2018 0841    CBC    Component Value Date/Time   WBC 6.6 07/16/2018 0841   RBC 4.89 07/16/2018 0841   HGB 14.5 07/16/2018 0841   HCT 43.8 07/16/2018 0841   PLT 194.0 07/16/2018 0841   MCV 89.6 07/16/2018 0841   MCV 86.6 06/24/2016 0909   MCH 30.1 09/09/2017 1359   MCHC 33.2 07/16/2018 0841   RDW 13.6 07/16/2018 0841    Hgb A1C No results found for: HGBA1C         Assessment & Plan:   Skin Tag, Left Axilla:  Discussed how this is benign She would like it removed- see procedure note  Procedure Note:  Discussed risks and benefits of procedure, including pain, bleeding or infection Area cleansed with Betadine x 2 Area numbned with PainEase topical spray Skin tag removed with #11 blade Covered with bandaid Pt tolerated well No complications  Return precautions discussed Webb Silversmith, NP

## 2018-12-08 DIAGNOSIS — N3941 Urge incontinence: Secondary | ICD-10-CM | POA: Diagnosis not present

## 2018-12-16 ENCOUNTER — Encounter (INDEPENDENT_AMBULATORY_CARE_PROVIDER_SITE_OTHER): Payer: Self-pay | Admitting: Orthopaedic Surgery

## 2018-12-16 ENCOUNTER — Other Ambulatory Visit: Payer: Self-pay

## 2018-12-16 ENCOUNTER — Ambulatory Visit (INDEPENDENT_AMBULATORY_CARE_PROVIDER_SITE_OTHER): Payer: PPO | Admitting: Orthopaedic Surgery

## 2018-12-16 DIAGNOSIS — Z9889 Other specified postprocedural states: Secondary | ICD-10-CM

## 2018-12-16 DIAGNOSIS — Z96651 Presence of right artificial knee joint: Secondary | ICD-10-CM

## 2018-12-16 NOTE — Progress Notes (Signed)
The patient is continue to follow-up at 5 weeks status post a left shoulder arthroscopy with subacromial decompression and debridement.  She is a very active 78 year old female.  She still is having some tenderness laterally in her shoulder and a sore for her but she feels like she is doing better overall.  She still feels like she does not need therapy.  We do follow her for her painful right partial knee arthroplasty.  She does feel that at some point and needs to be changed out to a total knee.  On examination of her left shoulder, she seems to move the shoulder well but there is still some stiffness.  I do feel that she benefit from therapy but she still wants to try to work this out on her own.  We will see her back next month.  It will be a week before she travels if she would like this consider steroid injection in her right knee at that visit.  We will maybe consider one in her left shoulder.  We will then work towards scheduling her for revision from a partial knee to a total knee for sometime later in May or June.

## 2018-12-19 ENCOUNTER — Other Ambulatory Visit: Payer: Self-pay | Admitting: Family Medicine

## 2018-12-19 DIAGNOSIS — J4520 Mild intermittent asthma, uncomplicated: Secondary | ICD-10-CM

## 2019-01-05 DIAGNOSIS — N3941 Urge incontinence: Secondary | ICD-10-CM | POA: Diagnosis not present

## 2019-01-25 ENCOUNTER — Ambulatory Visit (INDEPENDENT_AMBULATORY_CARE_PROVIDER_SITE_OTHER): Payer: PPO | Admitting: Orthopaedic Surgery

## 2019-01-25 ENCOUNTER — Encounter (INDEPENDENT_AMBULATORY_CARE_PROVIDER_SITE_OTHER): Payer: Self-pay | Admitting: Orthopaedic Surgery

## 2019-01-25 ENCOUNTER — Other Ambulatory Visit: Payer: Self-pay

## 2019-01-25 DIAGNOSIS — M25561 Pain in right knee: Secondary | ICD-10-CM

## 2019-01-25 DIAGNOSIS — G8929 Other chronic pain: Secondary | ICD-10-CM

## 2019-01-25 DIAGNOSIS — Z9889 Other specified postprocedural states: Secondary | ICD-10-CM

## 2019-01-25 MED ORDER — LIDOCAINE HCL 1 % IJ SOLN
3.0000 mL | INTRAMUSCULAR | Status: AC | PRN
  Administered 2019-01-25: 3 mL

## 2019-01-25 MED ORDER — METHYLPREDNISOLONE ACETATE 40 MG/ML IJ SUSP
40.0000 mg | INTRAMUSCULAR | Status: AC | PRN
  Administered 2019-01-25: 10:00:00 40 mg via INTRA_ARTICULAR

## 2019-01-25 NOTE — Progress Notes (Signed)
Office Visit Note   Patient: [name]           Date of Birth: 11/23/1940           MRN: [id number] Visit Date: 01/25/2019              Requested by: Jearld Fenton, NP [address] [place], [place] [address] PCP: Jearld Fenton, NP   Assessment & Plan: Visit Diagnoses:  1. Chronic pain of right knee   2. Status post arthroscopy of left shoulder     Plan: We will see her back in early June to discuss revision of her right partial knee to a total knee arthroplasty.  She will continue to work on range of motion strengthening of the left shoulder.  Questions were encouraged and answered.  Follow-Up Instructions: Return in about 6 weeks (around 03/08/2019).   Orders:  Orders Placed This Encounter  Procedures  . Large Joint Inj   No orders of the defined types were placed in this encounter.     Procedures: Large Joint Inj: L knee on 01/25/2019 9:32 AM Indications: pain Details: 22 G 1.5 in needle, anterolateral approach  Arthrogram: No  Medications: 3 mL lidocaine 1 %; 40 mg methylPREDNISolone acetate 40 MG/ML Outcome: tolerated well, no immediate complications Procedure, treatment alternatives, risks and benefits explained, specific risks discussed. Consent was given by the patient. Immediately prior to procedure a time out was called to verify the correct patient, procedure, equipment, support staff and site/side marked as required. Patient was prepped and draped in the usual sterile fashion.       Clinical Data: No additional findings.   Subjective: Chief Complaint  Patient presents with  . Left Shoulder - Follow-up    HPI [name] returns now 11 weeks status post left shoulder arthroscopy with subacromial decompression and debridement.  States overall her shoulder strain towards improvement.  Main complaint is she does not have full internal and external rotation.  She is having no significant pain in the shoulder.  She continues to work on  range of motion strengthening.  She is requesting an injection in the right knee she has right partial knee replacement that this needs to be revised.  Review of Systems Denies any fevers or chills  Objective: Vital Signs: There were no vitals taken for this visit.  Physical Exam General: Well-developed well-nourished female no acute distress mood and affect appropriate. Ortho Exam Left shoulder she has full forward flexion and abduction to approximately 160 degrees.  Some stiffness with internal rotation and slight reduction external rotation.   Right knee: Full extension flexion to approximately 110 degrees.  Significant valgus deformity.  No abnormal warmth erythema.  She has diffuse tenderness throughout the knee with palpation.  Surgical incisions well-healed.  Specialty Comments:  No specialty comments available.  Imaging: No results found.   PMFS History: Patient Active Problem List   Diagnosis Date Noted  . Status post arthroscopy of left shoulder 11/19/2018  . Acid reflux 04/06/2018  . 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 Epistaxis, recurrent 11/08/2016  . Oral lesion 09/02/2016  . Degenerative arthritis of knee, bilateral 07/11/2016  . 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 Allergic rhinitis 12/22/2011   Past Medical History:  Diagnosis Date  . Arthritis   . Cancer ([place])   . History of chicken pox   . History of colon polyps   . History of mouth cancer   . Osteoarthritis of knee  Medial compartment, Right    No family history on file.  Past Surgical History:  Procedure Laterality Date  . APPENDECTOMY    . DILATION AND CURETTAGE OF UTERUS    . 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 KNEE ARTHROSCOPY W/ MENISCAL REPAIR Left   . PARTIAL KNEE ARTHROPLASTY Right 09/16/2017  . PARTIAL KNEE ARTHROPLASTY Right 09/16/2017   Procedure: RIGHT UNICOMPARTMENTAL KNEE;  Surgeon: Mcarthur Rossetti, MD;  Location: [facility];  Service: Orthopedics;  Laterality:  Right;  . TUBAL LIGATION     Social History   Occupational History  . Not on file  Tobacco Use  . Smoking status: Former Smoker    Last attempt to quit: 12/28/1986    Years since quitting: 32.0  . Smokeless tobacco: Never Used  Substance and Sexual Activity  . Alcohol use: No  . Drug use: No  . Sexual activity: Not Currently

## 2019-02-02 DIAGNOSIS — N3941 Urge incontinence: Secondary | ICD-10-CM | POA: Diagnosis not present

## 2019-02-23 DIAGNOSIS — N3941 Urge incontinence: Secondary | ICD-10-CM | POA: Diagnosis not present

## 2019-03-15 ENCOUNTER — Other Ambulatory Visit: Payer: Self-pay

## 2019-03-15 ENCOUNTER — Ambulatory Visit (INDEPENDENT_AMBULATORY_CARE_PROVIDER_SITE_OTHER): Payer: PPO | Admitting: Orthopaedic Surgery

## 2019-03-15 ENCOUNTER — Encounter: Payer: Self-pay | Admitting: Orthopaedic Surgery

## 2019-03-15 DIAGNOSIS — Z9889 Other specified postprocedural states: Secondary | ICD-10-CM | POA: Diagnosis not present

## 2019-03-15 DIAGNOSIS — M25561 Pain in right knee: Secondary | ICD-10-CM | POA: Diagnosis not present

## 2019-03-15 DIAGNOSIS — Z96651 Presence of right artificial knee joint: Secondary | ICD-10-CM

## 2019-03-15 DIAGNOSIS — G8929 Other chronic pain: Secondary | ICD-10-CM

## 2019-03-15 NOTE — Progress Notes (Signed)
The patient is well-known to me.  She has a history of a left shoulder arthroscopy with subacromial decompression and debridement we did earlier this year.  She said the shoulder is doing well.  Her right knee is status post a right partial knee arthroplasty done remotely.  It still hurts for her.  It hurts mainly with pivoting activities and doing yard work.  We have looked for prosthetic loosening and scope the knee since surgery.  We have provided steroid injections as well as worked on activity modification and quad strengthening.  She is still not satisfied with her partial knee replacement.  On exam she can lift her arm on the left side above her head easily.  She has X range of motion and good strength in the left shoulder.  The right knee is examined and shows no instability on exam with excellent range of motion.  There is no effusion.  There is some global tenderness.  At this point 1 option would be to revise or convert her partial knee arthroplasty to a total knee arthroplasty on the right side.  I showed her knee model explained in detail with the surgery involves including a thorough discussion risk and benefits of surgery.  I gave her surgery scheduler's card.  She will think about this.  All question concerns were answered addressed.

## 2019-03-23 DIAGNOSIS — N3941 Urge incontinence: Secondary | ICD-10-CM | POA: Diagnosis not present

## 2019-04-20 DIAGNOSIS — N3946 Mixed incontinence: Secondary | ICD-10-CM | POA: Diagnosis not present

## 2019-04-20 DIAGNOSIS — N3941 Urge incontinence: Secondary | ICD-10-CM | POA: Diagnosis not present

## 2019-04-21 ENCOUNTER — Encounter: Payer: Self-pay | Admitting: Orthopaedic Surgery

## 2019-05-11 ENCOUNTER — Encounter: Payer: Self-pay | Admitting: Orthopaedic Surgery

## 2019-05-11 ENCOUNTER — Other Ambulatory Visit: Payer: Self-pay | Admitting: Physician Assistant

## 2019-05-11 MED ORDER — CYCLOBENZAPRINE HCL 10 MG PO TABS: 5.0000 mg | ORAL_TABLET | Freq: Three times a day (TID) | ORAL | 0 refills | Status: DC | PRN

## 2019-06-01 DIAGNOSIS — N3941 Urge incontinence: Secondary | ICD-10-CM | POA: Diagnosis not present

## 2019-06-07 ENCOUNTER — Encounter: Payer: Self-pay | Admitting: Internal Medicine

## 2019-06-07 ENCOUNTER — Other Ambulatory Visit: Payer: Self-pay | Admitting: Obstetrics and Gynecology

## 2019-06-07 ENCOUNTER — Other Ambulatory Visit: Payer: Self-pay

## 2019-06-07 DIAGNOSIS — R928 Other abnormal and inconclusive findings on diagnostic imaging of breast: Secondary | ICD-10-CM

## 2019-06-08 ENCOUNTER — Other Ambulatory Visit: Payer: Self-pay | Admitting: Physician Assistant

## 2019-06-09 ENCOUNTER — Ambulatory Visit (INDEPENDENT_AMBULATORY_CARE_PROVIDER_SITE_OTHER): Payer: PPO | Admitting: Orthopaedic Surgery

## 2019-06-09 ENCOUNTER — Ambulatory Visit (INDEPENDENT_AMBULATORY_CARE_PROVIDER_SITE_OTHER): Payer: PPO

## 2019-06-09 ENCOUNTER — Encounter: Payer: Self-pay | Admitting: Orthopaedic Surgery

## 2019-06-09 DIAGNOSIS — M5442 Lumbago with sciatica, left side: Secondary | ICD-10-CM

## 2019-06-09 MED ORDER — GABAPENTIN 300 MG PO CAPS: 300.0000 mg | ORAL_CAPSULE | Freq: Every day | ORAL | 1 refills | Status: DC

## 2019-06-09 NOTE — Progress Notes (Signed)
Office Visit Note   Patient: [name]           Date of Birth: 01-17-1941           MRN: [id number] Visit Date: 06/09/2019              Requested by: Jearld Fenton, NP [address] [place],  [place] [address] PCP: Jearld Fenton, NP   Assessment & Plan: Visit Diagnoses:  1. Acute bilateral low back pain with left-sided sciatica     Plan: I am going to start her on Neurontin 300 mg to take just at bedtime.  I am going to not put her on a steroid taper since she has upcoming knee surgery.  We may need to work her lumbar spine up further once she is done with her knee revision surgery.  All question concerns were answered addressed.  We will see her back at her 2-week visit after surgery but no x-rays are needed.  Follow-Up Instructions: Return in about 2 weeks (around 06/23/2019).   Orders:  Orders Placed This Encounter  Procedures  . XR Lumbar Spine 2-3 Views   Meds ordered this encounter  Medications  . gabapentin (NEURONTIN) 300 MG capsule    Sig: Take 1 capsule (300 mg total) by mouth at bedtime.    Dispense:  30 capsule    Refill:  1      Procedures: No procedures performed   Clinical Data: No additional findings.   Subjective: Chief Complaint  Patient presents with  . Lower Back - Pain  The patient is well-known to [place].  She is actually undergoing a revision arthroplasty of her right partial knee to a total knee next week.  She has been dealing with a several week history of left-sided low back pain that radiates down her left leg.  She has had epidural steroid injections in the past but is been remote.  She was started on muscle relaxant recently and that helped some.  She said this is irritating pain is not going away.  Occasionally again is radiating down her left leg.  Sometimes is been on the right side as well.  She denies any change in bowel or bladder function.  She denies any weakness in her legs.  HPI  Review of Systems She currently  denies any headache, chest pain, shortness of breath, fever, chills, nausea, vomiting  Objective: Vital Signs: There were no vitals taken for this visit.  Physical Exam She is alert and orient x3 and in no acute distress Ortho Exam Examination of both her lower extremities shows no muscle or motor deficits and no weakness.  There is no numbness and tingling.  She has a slightly positive straight leg raise to the left side.  She has pain with flexion extension of the lumbar spine mainly to the left side. Specialty Comments:  No specialty comments available.  Imaging: Xr Lumbar Spine 2-3 Views  Result Date: 06/09/2019 2 views of the lumbar spine show no acute findings.  There is overall good alignment.  The bone is slightly osteopenic.  There is degenerative changes at several levels.    PMFS History: Patient Active Problem List   Diagnosis Date Noted  . Status post arthroscopy of left shoulder 11/19/2018  . Acid reflux 04/06/2018  . 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 Epistaxis, recurrent 11/08/2016  . Oral lesion 09/02/2016  . Degenerative arthritis of knee, bilateral 07/11/2016  . 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  Allergic rhinitis 12/22/2011   Past Medical History:  Diagnosis Date  . Arthritis   . Cancer ([place])   . History of chicken pox   . History of colon polyps   . History of mouth cancer   . Osteoarthritis of knee    Medial compartment, Right    History reviewed. No pertinent family history.  Past Surgical History:  Procedure Laterality Date  . APPENDECTOMY    . DILATION AND CURETTAGE OF UTERUS    . 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 KNEE ARTHROSCOPY W/ MENISCAL REPAIR Left   . PARTIAL KNEE ARTHROPLASTY Right 09/16/2017  . PARTIAL KNEE ARTHROPLASTY Right 09/16/2017   Procedure: RIGHT UNICOMPARTMENTAL KNEE;  Surgeon: Mcarthur Rossetti, MD;  Location: [facility];  Service: Orthopedics;  Laterality: Right;  . TUBAL LIGATION     Social History    Occupational History  . Not on file  Tobacco Use  . Smoking status: Former Smoker    Quit date: 12/28/1986    Years since quitting: 32.4  . Smokeless tobacco: Never Used  Substance and Sexual Activity  . Alcohol use: No  . Drug use: No  . Sexual activity: Not Currently

## 2019-06-10 NOTE — Pre-Procedure Instructions (Signed)
[name]  06/10/2019      [id number] - [place], [place] - [address] [place] [address] Phone: [phone] Fax: [phone]    Your procedure is scheduled on Sept. 8  Report to [facility] Entrance A at 10:00 A.M.  Call this number if you have problems the morning of surgery:  [phone]   Remember:  Do not eat after midnight.  You may drink clear liquids until 9:00A.M..  Clear liquids allowed are:                    Water, Juice (non-citric and without pulp), Carbonated beverages, Clear Tea, Black Coffee only, Plain Jell-O only, Gatorade and Plain Popsicles only    Take these medicines the morning of surgery with A SIP OF WATER :               Tylenol if needed               Albuterol inhaler if needed--bring to hospital               Cetirizine (zyrtec)               Duloxetine (cymbalta)               Esomeprazole (nexium)                 Please complete your PRE-SURGERY ENSURE that was provided to you by 9:00 am the morning of surgery.  Please, if able, drink it in one setting. DO NOT SIP.                         7 days prior to surgery STOP taking any Aspirin (unless otherwise instructed by your surgeon), Aleve, Naproxen, Ibuprofen, Motrin, Advil, Goody's, BC's, all herbal medications, fish oil, and all vitamins.            Follow your surgeon's instructions on when to stop Aspirin.  If no instructions were given by your surgeon then you will need to call the office to get those instructions.        Do not wear jewelry, make-up or nail polish.  Do not wear lotions, powders, or perfumes, or deodorant.  Do not shave 48 hours prior to surgery.  Men may shave face and neck.  Do not bring valuables to the hospital.  [facility] is not responsible for any belongings or valuables.  Contacts, dentures or bridgework may not be worn into surgery.  Leave your suitcase in the car.  After surgery it may be brought to your  room.  For patients admitted to the hospital, discharge time will be determined by your treatment team.  Patients discharged the day of surgery will not be allowed to drive home.    Special instructions:   [facility]- Preparing For Surgery  Before surgery, you can play an important role. Because skin is not sterile, your skin needs to be as free of germs as possible. You can reduce the number of germs on your skin by washing with CHG (chlorahexidine gluconate) Soap before surgery.  CHG is an antiseptic cleaner which kills germs and bonds with the skin to continue killing germs even after washing.    Oral Hygiene is also important to reduce your risk of infection.  Remember - BRUSH YOUR TEETH THE MORNING OF SURGERY WITH YOUR REGULAR TOOTHPASTE  Please do not use if you  have an allergy to CHG or antibacterial soaps. If your skin becomes reddened/irritated stop using the CHG.  Do not shave (including legs and underarms) for at least 48 hours prior to first CHG shower. It is OK to shave your face.  Please follow these instructions carefully.   1. Shower the NIGHT BEFORE SURGERY and the MORNING OF SURGERY with CHG.   2. If you chose to wash your hair, wash your hair first as usual with your normal shampoo.  3. After you shampoo, rinse your hair and body thoroughly to remove the shampoo.  4. Use CHG as you would any other liquid soap. You can apply CHG directly to the skin and wash gently with a scrungie or a clean washcloth.   5. Apply the CHG Soap to your body ONLY FROM THE NECK DOWN.  Do not use on open wounds or open sores. Avoid contact with your eyes, ears, mouth and genitals (private parts). Wash Face and genitals (private parts)  with your normal soap.  6. Wash thoroughly, paying special attention to the area where your surgery will be performed.  7. Thoroughly rinse your body with warm water from the neck down.  8. DO NOT shower/wash with your normal soap after using and rinsing  off the CHG Soap.  9. Pat yourself dry with a CLEAN TOWEL.  10. Wear CLEAN PAJAMAS to bed the night before surgery, wear comfortable clothes the morning of surgery  11. Place CLEAN SHEETS on your bed the night of your first shower and DO NOT SLEEP WITH PETS.    Day of Surgery:  Do not apply any deodorants/lotions.  Please wear clean clothes to the hospital/surgery center.   Remember to brush your teeth WITH YOUR REGULAR TOOTHPASTE.    Please read over the following fact sheets that you were given. Coughing and Deep Breathing, Total Joint Packet and Surgical Site Infection Prevention

## 2019-06-11 ENCOUNTER — Other Ambulatory Visit (HOSPITAL_COMMUNITY)
Admission: RE | Admit: 2019-06-11 | Discharge: 2019-06-11 | Disposition: A | Discharge status: Home / Self Care | Payer: PPO | Source: Ambulatory Visit | Attending: Orthopaedic Surgery | Admitting: Orthopaedic Surgery

## 2019-06-11 ENCOUNTER — Encounter (HOSPITAL_COMMUNITY): Payer: Self-pay

## 2019-06-11 ENCOUNTER — Encounter (HOSPITAL_COMMUNITY)
Admission: RE | Admit: 2019-06-11 | Discharge: 2019-06-11 | Disposition: A | Discharge status: Home / Self Care | Payer: PPO | Source: Ambulatory Visit | Attending: Orthopaedic Surgery | Admitting: Orthopaedic Surgery

## 2019-06-11 ENCOUNTER — Other Ambulatory Visit: Payer: Self-pay

## 2019-06-11 DIAGNOSIS — Z01812 Encounter for preprocedural laboratory examination: Secondary | ICD-10-CM | POA: Diagnosis not present

## 2019-06-11 DIAGNOSIS — Z20828 Contact with and (suspected) exposure to other viral communicable diseases: Secondary | ICD-10-CM | POA: Diagnosis not present

## 2019-06-11 LAB — CBC
HCT: 45.9 % (ref 36.0–46.0)
Hemoglobin: 14.9 g/dL (ref 12.0–15.0)
MCH: 29.9 pg (ref 26.0–34.0)
MCHC: 32.5 g/dL (ref 30.0–36.0)
MCV: 92 fL (ref 80.0–100.0)
Platelets: 187 10*3/uL (ref 150–400)
RBC: 4.99 MIL/uL (ref 3.87–5.11)
RDW: 13 % (ref 11.5–15.5)
WBC: 6.3 10*3/uL (ref 4.0–10.5)
nRBC: 0 % (ref 0.0–0.2)

## 2019-06-11 LAB — SURGICAL PCR SCREEN
MRSA, PCR: NEGATIVE
Staphylococcus aureus: NEGATIVE

## 2019-06-11 NOTE — Progress Notes (Signed)
  Coronavirus Screening Scheduled for COVID test today. Have you experienced the following symptoms:  Cough yes/no: No Fever (>100.49F)  yes/no: No Runny nose yes/no: No Sore throat yes/no: No Difficulty breathing/shortness of breath  yes/no: No Loss of smell or taste-No Have you or a family member traveled in the last 14 days and where? yes/no: No  PCP - Cleaster Corin, FNP  Cardiologist - denies  ENT-Dr. Wilburn Cornelia  Chest x-ray - denies  EKG - denies  Stress Test - denies  ECHO - 07-27-15(Epic) Was ordered after colonoscopy procedure.  Cardiac Cath - denies  AICD-denies PM-denies LOOP-denies  Sleep Study - denies CPAP - NA  LABS-CBC, PCR  ASA-81 mg. Will stop today.  ERAS-Pres-surgery Ensure with instructions  HA1C-denies Fasting Blood Sugar -  Checks Blood Sugar __0___ times a day  Anesthesia-N  Pt denies having chest pain, sob, or fever at this time. All instructions explained to the pt, with a verbal understanding of the material. Pt agrees to go over the instructions while at home for a better understanding. Pt also instructed to self quarantine after being tested for COVID-19. The opportunity to ask questions was provided.

## 2019-06-12 LAB — NOVEL CORONAVIRUS, NAA (HOSP ORDER, SEND-OUT TO REF LAB; TAT 18-24 HRS): SARS-CoV-2, NAA: NOT DETECTED

## 2019-06-15 ENCOUNTER — Inpatient Hospital Stay (HOSPITAL_COMMUNITY): Payer: PPO | Admitting: Certified Registered Nurse Anesthetist

## 2019-06-15 ENCOUNTER — Encounter (HOSPITAL_COMMUNITY): Payer: Self-pay | Admitting: *Deleted

## 2019-06-15 ENCOUNTER — Inpatient Hospital Stay (HOSPITAL_COMMUNITY): Payer: PPO

## 2019-06-15 ENCOUNTER — Encounter (HOSPITAL_COMMUNITY)
Admission: RE | Disposition: A | Discharge status: Home Health | Payer: Self-pay | Source: Home / Self Care | Attending: Orthopaedic Surgery

## 2019-06-15 ENCOUNTER — Inpatient Hospital Stay (HOSPITAL_COMMUNITY)
Admission: RE | Admit: 2019-06-15 | Discharge: 2019-06-17 | DRG: 468 | Disposition: A | Discharge status: Home Health | Payer: PPO | Attending: Orthopaedic Surgery | Admitting: Orthopaedic Surgery

## 2019-06-15 ENCOUNTER — Other Ambulatory Visit: Payer: Self-pay

## 2019-06-15 DIAGNOSIS — Z96652 Presence of left artificial knee joint: Secondary | ICD-10-CM | POA: Diagnosis present

## 2019-06-15 DIAGNOSIS — Z471 Aftercare following joint replacement surgery: Secondary | ICD-10-CM | POA: Diagnosis not present

## 2019-06-15 DIAGNOSIS — Z85819 Personal history of malignant neoplasm of unspecified site of lip, oral cavity, and pharynx: Secondary | ICD-10-CM | POA: Diagnosis not present

## 2019-06-15 DIAGNOSIS — G8918 Other acute postprocedural pain: Secondary | ICD-10-CM | POA: Diagnosis not present

## 2019-06-15 DIAGNOSIS — Z7951 Long term (current) use of inhaled steroids: Secondary | ICD-10-CM | POA: Diagnosis not present

## 2019-06-15 DIAGNOSIS — Z96659 Presence of unspecified artificial knee joint: Secondary | ICD-10-CM

## 2019-06-15 DIAGNOSIS — T8484XA Pain due to internal orthopedic prosthetic devices, implants and grafts, initial encounter: Principal | ICD-10-CM | POA: Diagnosis present

## 2019-06-15 DIAGNOSIS — Z96651 Presence of right artificial knee joint: Secondary | ICD-10-CM | POA: Diagnosis not present

## 2019-06-15 DIAGNOSIS — K219 Gastro-esophageal reflux disease without esophagitis: Secondary | ICD-10-CM | POA: Diagnosis present

## 2019-06-15 DIAGNOSIS — T84062A Wear of articular bearing surface of internal prosthetic right knee joint, initial encounter: Secondary | ICD-10-CM | POA: Diagnosis not present

## 2019-06-15 DIAGNOSIS — Z7982 Long term (current) use of aspirin: Secondary | ICD-10-CM

## 2019-06-15 DIAGNOSIS — M1711 Unilateral primary osteoarthritis, right knee: Secondary | ICD-10-CM | POA: Diagnosis not present

## 2019-06-15 DIAGNOSIS — G5603 Carpal tunnel syndrome, bilateral upper limbs: Secondary | ICD-10-CM | POA: Diagnosis not present

## 2019-06-15 DIAGNOSIS — Z87891 Personal history of nicotine dependence: Secondary | ICD-10-CM

## 2019-06-15 HISTORY — PX: TOTAL KNEE REVISION: SHX996

## 2019-06-15 SURGERY — TOTAL KNEE REVISION
Anesthesia: Monitor Anesthesia Care | Site: Knee | Laterality: Right

## 2019-06-15 MED ORDER — CEFAZOLIN SODIUM-DEXTROSE 1-4 GM/50ML-% IV SOLN
1.0000 g | Freq: Four times a day (QID) | INTRAVENOUS | Status: AC
  Administered 2019-06-16: 1 g via INTRAVENOUS
  Filled 2019-06-15: qty 50

## 2019-06-15 MED ORDER — SODIUM CHLORIDE 0.9 % IR SOLN
Status: DC | PRN
  Administered 2019-06-15 (×2): 1000 mL

## 2019-06-15 MED ORDER — POLYETHYLENE GLYCOL 3350 17 G PO PACK: 17.0000 g | PACK | Freq: Every day | ORAL | Status: DC | PRN

## 2019-06-15 MED ORDER — TRANEXAMIC ACID-NACL 1000-0.7 MG/100ML-% IV SOLN
1000.0000 mg | INTRAVENOUS | Status: AC
  Administered 2019-06-15: 12:00:00 1000 mg via INTRAVENOUS

## 2019-06-15 MED ORDER — ASPIRIN 81 MG PO CHEW
81.0000 mg | CHEWABLE_TABLET | Freq: Two times a day (BID) | ORAL | Status: DC
  Administered 2019-06-15 – 2019-06-17 (×4): 81 mg via ORAL
  Filled 2019-06-15 (×4): qty 1

## 2019-06-15 MED ORDER — HYDROMORPHONE HCL 1 MG/ML IJ SOLN
INTRAMUSCULAR | Status: AC
  Filled 2019-06-15: qty 1

## 2019-06-15 MED ORDER — METHOCARBAMOL 500 MG PO TABS
500.0000 mg | ORAL_TABLET | Freq: Four times a day (QID) | ORAL | Status: DC | PRN
  Administered 2019-06-15 – 2019-06-16 (×4): 500 mg via ORAL
  Filled 2019-06-15 (×4): qty 1

## 2019-06-15 MED ORDER — HYDROMORPHONE HCL 1 MG/ML IJ SOLN: 0.5000 mg | INTRAMUSCULAR | Status: DC | PRN

## 2019-06-15 MED ORDER — GABAPENTIN 300 MG PO CAPS
300.0000 mg | ORAL_CAPSULE | Freq: Every day | ORAL | Status: DC
  Administered 2019-06-15 – 2019-06-16 (×2): 300 mg via ORAL
  Filled 2019-06-15 (×2): qty 1

## 2019-06-15 MED ORDER — OXYCODONE HCL 5 MG PO TABS
10.0000 mg | ORAL_TABLET | ORAL | Status: DC | PRN
  Administered 2019-06-16 (×3): 10 mg via ORAL

## 2019-06-15 MED ORDER — 0.9 % SODIUM CHLORIDE (POUR BTL) OPTIME
TOPICAL | Status: DC | PRN
  Administered 2019-06-15: 1000 mL

## 2019-06-15 MED ORDER — MIDAZOLAM HCL 5 MG/5ML IJ SOLN
INTRAMUSCULAR | Status: DC | PRN
  Administered 2019-06-15: .5 mg via INTRAVENOUS

## 2019-06-15 MED ORDER — MENTHOL 3 MG MT LOZG: 1.0000 | LOZENGE | OROMUCOSAL | Status: DC | PRN

## 2019-06-15 MED ORDER — ONDANSETRON HCL 4 MG/2ML IJ SOLN: 4.0000 mg | Freq: Four times a day (QID) | INTRAMUSCULAR | Status: DC | PRN

## 2019-06-15 MED ORDER — BUPIVACAINE-EPINEPHRINE (PF) 0.5% -1:200000 IJ SOLN
INTRAMUSCULAR | Status: DC | PRN
  Administered 2019-06-15: 20 mL via PERINEURAL

## 2019-06-15 MED ORDER — FENTANYL CITRATE (PF) 250 MCG/5ML IJ SOLN
INTRAMUSCULAR | Status: AC
  Filled 2019-06-15: qty 5

## 2019-06-15 MED ORDER — BUPIVACAINE IN DEXTROSE 0.75-8.25 % IT SOLN
INTRATHECAL | Status: DC | PRN
  Administered 2019-06-15: 1.8 mL via INTRATHECAL

## 2019-06-15 MED ORDER — ONDANSETRON HCL 4 MG/2ML IJ SOLN
INTRAMUSCULAR | Status: DC | PRN
  Administered 2019-06-15: 4 mg via INTRAVENOUS

## 2019-06-15 MED ORDER — METHOCARBAMOL 1000 MG/10ML IJ SOLN
500.0000 mg | Freq: Four times a day (QID) | INTRAVENOUS | Status: DC | PRN
  Filled 2019-06-15: qty 5

## 2019-06-15 MED ORDER — FENTANYL CITRATE (PF) 100 MCG/2ML IJ SOLN
INTRAMUSCULAR | Status: AC
  Administered 2019-06-15: 50 ug via INTRAVENOUS
  Filled 2019-06-15: qty 2

## 2019-06-15 MED ORDER — FENTANYL CITRATE (PF) 100 MCG/2ML IJ SOLN
INTRAMUSCULAR | Status: DC | PRN
  Administered 2019-06-15: 25 ug via INTRAVENOUS

## 2019-06-15 MED ORDER — PANTOPRAZOLE SODIUM 40 MG PO TBEC
40.0000 mg | DELAYED_RELEASE_TABLET | Freq: Every day | ORAL | Status: DC
  Administered 2019-06-16 – 2019-06-17 (×2): 40 mg via ORAL
  Filled 2019-06-15 (×2): qty 1

## 2019-06-15 MED ORDER — DULOXETINE HCL 20 MG PO CPEP
20.0000 mg | ORAL_CAPSULE | Freq: Every day | ORAL | Status: DC
  Administered 2019-06-16 – 2019-06-17 (×2): 20 mg via ORAL
  Filled 2019-06-15 (×2): qty 1

## 2019-06-15 MED ORDER — CHLORHEXIDINE GLUCONATE 4 % EX LIQD: 60.0000 mL | Freq: Once | CUTANEOUS | Status: DC

## 2019-06-15 MED ORDER — FLUTICASONE PROPIONATE 50 MCG/ACT NA SUSP
2.0000 | Freq: Every day | NASAL | Status: DC
  Administered 2019-06-15 – 2019-06-16 (×2): 2 via NASAL
  Filled 2019-06-15: qty 16

## 2019-06-15 MED ORDER — PHENOL 1.4 % MT LIQD: 1.0000 | OROMUCOSAL | Status: DC | PRN

## 2019-06-15 MED ORDER — DOCUSATE SODIUM 100 MG PO CAPS
100.0000 mg | ORAL_CAPSULE | Freq: Two times a day (BID) | ORAL | Status: DC
  Administered 2019-06-15 – 2019-06-17 (×4): 100 mg via ORAL
  Filled 2019-06-15 (×3): qty 1

## 2019-06-15 MED ORDER — CALCIUM CARBONATE-VITAMIN D 500-200 MG-UNIT PO TABS
1.0000 | ORAL_TABLET | Freq: Two times a day (BID) | ORAL | Status: DC
  Administered 2019-06-15 – 2019-06-17 (×4): 1 via ORAL
  Filled 2019-06-15 (×4): qty 1

## 2019-06-15 MED ORDER — FENTANYL CITRATE (PF) 100 MCG/2ML IJ SOLN
100.0000 ug | Freq: Once | INTRAMUSCULAR | Status: AC
  Administered 2019-06-15: 11:00:00 50 ug via INTRAVENOUS

## 2019-06-15 MED ORDER — PROPOFOL 500 MG/50ML IV EMUL
INTRAVENOUS | Status: DC | PRN
  Administered 2019-06-15: 50 ug/kg/min via INTRAVENOUS

## 2019-06-15 MED ORDER — CEFAZOLIN SODIUM-DEXTROSE 2-4 GM/100ML-% IV SOLN
2.0000 g | INTRAVENOUS | Status: AC
  Administered 2019-06-15: 2 g via INTRAVENOUS

## 2019-06-15 MED ORDER — METOCLOPRAMIDE HCL 5 MG PO TABS: 5.0000 mg | ORAL_TABLET | Freq: Three times a day (TID) | ORAL | Status: DC | PRN

## 2019-06-15 MED ORDER — LACTATED RINGERS IV SOLN
INTRAVENOUS | Status: DC
  Administered 2019-06-15: 13:00:00 via INTRAVENOUS
  Administered 2019-06-15: 1000 mL via INTRAVENOUS

## 2019-06-15 MED ORDER — ADULT MULTIVITAMIN W/MINERALS CH
1.0000 | ORAL_TABLET | Freq: Every evening | ORAL | Status: DC
  Administered 2019-06-16: 1 via ORAL
  Filled 2019-06-15: qty 1

## 2019-06-15 MED ORDER — MIDAZOLAM HCL 2 MG/2ML IJ SOLN
INTRAMUSCULAR | Status: AC
  Filled 2019-06-15: qty 2

## 2019-06-15 MED ORDER — DIPHENHYDRAMINE HCL 12.5 MG/5ML PO ELIX: 12.5000 mg | ORAL_SOLUTION | ORAL | Status: DC | PRN

## 2019-06-15 MED ORDER — ACETAMINOPHEN 500 MG PO TABS
ORAL_TABLET | ORAL | Status: AC
  Administered 2019-06-15: 1000 mg via ORAL
  Filled 2019-06-15: qty 2

## 2019-06-15 MED ORDER — POVIDONE-IODINE 10 % EX SWAB: 2.0000 "application " | Freq: Once | CUTANEOUS | Status: DC

## 2019-06-15 MED ORDER — PROPOFOL 10 MG/ML IV BOLUS
INTRAVENOUS | Status: AC
  Filled 2019-06-15: qty 20

## 2019-06-15 MED ORDER — ACETAMINOPHEN 500 MG PO TABS
1000.0000 mg | ORAL_TABLET | Freq: Once | ORAL | Status: AC
  Administered 2019-06-15: 11:00:00 1000 mg via ORAL

## 2019-06-15 MED ORDER — ONDANSETRON HCL 4 MG PO TABS: 4.0000 mg | ORAL_TABLET | Freq: Four times a day (QID) | ORAL | Status: DC | PRN

## 2019-06-15 MED ORDER — ALUM & MAG HYDROXIDE-SIMETH 200-200-20 MG/5ML PO SUSP: 30.0000 mL | ORAL | Status: DC | PRN

## 2019-06-15 MED ORDER — ACETAMINOPHEN 325 MG PO TABS: 325.0000 mg | ORAL_TABLET | Freq: Four times a day (QID) | ORAL | Status: DC | PRN

## 2019-06-15 MED ORDER — CEFAZOLIN SODIUM-DEXTROSE 2-4 GM/100ML-% IV SOLN
INTRAVENOUS | Status: AC
  Filled 2019-06-15: qty 100

## 2019-06-15 MED ORDER — TRANEXAMIC ACID-NACL 1000-0.7 MG/100ML-% IV SOLN
INTRAVENOUS | Status: AC
  Filled 2019-06-15: qty 100

## 2019-06-15 MED ORDER — HYDROMORPHONE HCL 1 MG/ML IJ SOLN
0.2500 mg | INTRAMUSCULAR | Status: DC | PRN
  Administered 2019-06-15 (×2): 0.5 mg via INTRAVENOUS

## 2019-06-15 MED ORDER — METOCLOPRAMIDE HCL 5 MG/ML IJ SOLN: 5.0000 mg | Freq: Three times a day (TID) | INTRAMUSCULAR | Status: DC | PRN

## 2019-06-15 MED ORDER — OXYCODONE HCL 5 MG PO TABS
5.0000 mg | ORAL_TABLET | ORAL | Status: DC | PRN
  Administered 2019-06-15 – 2019-06-17 (×3): 10 mg via ORAL
  Filled 2019-06-15 (×6): qty 2

## 2019-06-15 MED ORDER — SODIUM CHLORIDE 0.9 % IV SOLN
INTRAVENOUS | Status: DC
  Administered 2019-06-15: 20:00:00 via INTRAVENOUS

## 2019-06-15 SURGICAL SUPPLY — 75 items
BANDAGE ESMARK 6X9 LF (GAUZE/BANDAGES/DRESSINGS) ×1 IMPLANT
BLADE CLIPPER SURG (BLADE) IMPLANT
BLADE SAG 18X100X1.27 (BLADE) ×2 IMPLANT
BLADE SAGITTAL 25.0X1.27X90 (BLADE) ×1 IMPLANT
BNDG COHESIVE 6X5 TAN STRL LF (GAUZE/BANDAGES/DRESSINGS) ×3 IMPLANT
BNDG ELASTIC 6X5.8 VLCR STR LF (GAUZE/BANDAGES/DRESSINGS) ×2 IMPLANT
BNDG ESMARK 6X9 LF (GAUZE/BANDAGES/DRESSINGS) ×2
BOWL SMART MIX CTS (DISPOSABLE) ×1 IMPLANT
CEMENT BONE SIMPLEX SPEEDSET (Cement) ×4 IMPLANT
COMPONENT PATELLA 27X8 KNEE (Miscellaneous) ×1 IMPLANT
COVER SURGICAL LIGHT HANDLE (MISCELLANEOUS) ×2 IMPLANT
COVER WAND RF STERILE (DRAPES) ×1 IMPLANT
CUFF TOURN SGL QUICK 34 (TOURNIQUET CUFF) ×1
CUFF TOURN SGL QUICK 42 (TOURNIQUET CUFF) IMPLANT
CUFF TRNQT CYL 34X4.125X (TOURNIQUET CUFF) IMPLANT
DRAPE ORTHO SPLIT 77X108 STRL (DRAPES) ×2
DRAPE SURG ORHT 6 SPLT 77X108 (DRAPES) ×2 IMPLANT
DRAPE U-SHAPE 47X51 STRL (DRAPES) ×2 IMPLANT
DRSG PAD ABDOMINAL 8X10 ST (GAUZE/BANDAGES/DRESSINGS) ×3 IMPLANT
DRSG XEROFORM 1X8 (GAUZE/BANDAGES/DRESSINGS) ×1 IMPLANT
DURAPREP 26ML APPLICATOR (WOUND CARE) ×2 IMPLANT
ELECT REM PT RETURN 9FT ADLT (ELECTROSURGICAL) ×2
ELECTRODE REM PT RTRN 9FT ADLT (ELECTROSURGICAL) ×1 IMPLANT
EVACUATOR 1/8 PVC DRAIN (DRAIN) IMPLANT
FACESHIELD STD STERILE (MASK) ×4 IMPLANT
FEMORAL PEG DISTAL FIXATION (Orthopedic Implant) ×1 IMPLANT
FEMORAL POST STABILIZED NO 3 (Orthopedic Implant) ×1 IMPLANT
FEMORAL POSTERIOR STABILIZED (Knees) ×1 IMPLANT
GAUZE SPONGE 4X4 12PLY STRL (GAUZE/BANDAGES/DRESSINGS) ×2 IMPLANT
GAUZE SPONGE 4X4 12PLY STRL LF (GAUZE/BANDAGES/DRESSINGS) ×1 IMPLANT
GAUZE XEROFORM 1X8 LF (GAUZE/BANDAGES/DRESSINGS) ×2 IMPLANT
GLOVE BIO SURGEON STRL SZ8 (GLOVE) ×2 IMPLANT
GLOVE BIOGEL PI IND STRL 8 (GLOVE) ×2 IMPLANT
GLOVE BIOGEL PI INDICATOR 8 (GLOVE) ×2
GLOVE ORTHO TXT STRL SZ7.5 (GLOVE) ×2 IMPLANT
GOWN STRL REUS W/ TWL LRG LVL3 (GOWN DISPOSABLE) ×1 IMPLANT
GOWN STRL REUS W/ TWL XL LVL3 (GOWN DISPOSABLE) ×2 IMPLANT
GOWN STRL REUS W/TWL LRG LVL3 (GOWN DISPOSABLE) ×1
GOWN STRL REUS W/TWL XL LVL3 (GOWN DISPOSABLE) ×2
HANDPIECE INTERPULSE COAX TIP (DISPOSABLE) ×1
IMMOBILIZER KNEE 20 (SOFTGOODS) ×2
IMMOBILIZER KNEE 20 THIGH 36 (SOFTGOODS) IMPLANT
IMMOBILIZER KNEE 22 UNIV (SOFTGOODS) ×1 IMPLANT
INSERT TIB ARTI TRTH SZ2 13 (Insert) ×1 IMPLANT
KIT BASIN OR (CUSTOM PROCEDURE TRAY) ×2 IMPLANT
KIT TURNOVER KIT B (KITS) ×2 IMPLANT
MANIFOLD NEPTUNE II (INSTRUMENTS) ×2 IMPLANT
NS IRRIG 1000ML POUR BTL (IV SOLUTION) ×2 IMPLANT
PACK TOTAL JOINT (CUSTOM PROCEDURE TRAY) ×2 IMPLANT
PAD ARMBOARD 7.5X6 YLW CONV (MISCELLANEOUS) ×4 IMPLANT
PAD CAST 4YDX4 CTTN HI CHSV (CAST SUPPLIES) IMPLANT
PADDING CAST COTTON 4X4 STRL (CAST SUPPLIES) ×1
PADDING CAST COTTON 6X4 STRL (CAST SUPPLIES) ×2 IMPLANT
PIN FLUTED HEDLESS FIX 3.5X1/8 (PIN) ×1 IMPLANT
SET HNDPC FAN SPRY TIP SCT (DISPOSABLE) IMPLANT
SET PAD KNEE POSITIONER (MISCELLANEOUS) ×2 IMPLANT
STAPLER VISISTAT 35W (STAPLE) ×1 IMPLANT
STRIP CLOSURE SKIN 1/2X4 (GAUZE/BANDAGES/DRESSINGS) ×1 IMPLANT
SUCTION FRAZIER HANDLE 10FR (MISCELLANEOUS) ×1
SUCTION TUBE FRAZIER 10FR DISP (MISCELLANEOUS) ×1 IMPLANT
SUT MNCRL AB 4-0 PS2 18 (SUTURE) ×1 IMPLANT
SUT VIC AB 0 CT1 27 (SUTURE) ×2
SUT VIC AB 0 CT1 27XBRD ANBCTR (SUTURE) ×2 IMPLANT
SUT VIC AB 1 CT1 27 (SUTURE) ×3
SUT VIC AB 1 CT1 27XBRD ANBCTR (SUTURE) ×2 IMPLANT
SUT VIC AB 2-0 CT1 27 (SUTURE) ×2
SUT VIC AB 2-0 CT1 TAPERPNT 27 (SUTURE) ×2 IMPLANT
SWAB COLLECTION DEVICE MRSA (MISCELLANEOUS) IMPLANT
SWAB CULTURE ESWAB REG 1ML (MISCELLANEOUS) ×1 IMPLANT
TOWEL GREEN STERILE (TOWEL DISPOSABLE) ×2 IMPLANT
TOWEL GREEN STERILE FF (TOWEL DISPOSABLE) ×2 IMPLANT
TRAY FOLEY W/BAG SLVR 16FR (SET/KITS/TRAYS/PACK) ×1
TRAY FOLEY W/BAG SLVR 16FR ST (SET/KITS/TRAYS/PACK) IMPLANT
WATER STERILE IRR 1000ML POUR (IV SOLUTION) ×4 IMPLANT
WRAP KNEE MAXI GEL POST OP (GAUZE/BANDAGES/DRESSINGS) ×2 IMPLANT

## 2019-06-15 NOTE — Anesthesia Postprocedure Evaluation (Signed)
Anesthesia Post Note  Patient: [name]  Procedure(s) Performed: RIGHT UNICOMPARTMENTAL ARTHROPLASTY TO TOTAL KNEE ARTHROPLASTY (Right Knee)     Patient location during evaluation: PACU Anesthesia Type: MAC, Spinal and Regional Level of consciousness: oriented and awake and alert Pain management: pain level controlled Vital Signs Assessment: post-procedure vital signs reviewed and stable Respiratory status: spontaneous breathing and respiratory function stable Cardiovascular status: blood pressure returned to baseline and stable Postop Assessment: no headache, no backache, no apparent nausea or vomiting, spinal receding and patient able to bend at knees Anesthetic complications: no    Last Vitals:  Vitals:   06/15/19 1630 06/15/19 1700  BP: (!) 160/51 (!) 116/56  Pulse: 76 80  Resp: 12 13  Temp:    SpO2: 92% 93%    Last Pain:  Vitals:   06/15/19 1630  TempSrc:   PainSc: 4                  Casten Floren,W. EDMOND

## 2019-06-15 NOTE — H&P (Signed)
TOTAL KNEE REVISION ADMISSION H&P  Patient is being admitted for right revision total knee arthroplasty.  Subjective:  Chief Complaint:right knee pain.  HPI: [name], 78 y.o. female, has a history of pain and functional disability in the right knee(s) due to failed previous arthroplasty and patient has failed non-surgical conservative treatments for greater than 12 weeks to include NSAID's and/or analgesics, corticosteriod injections, flexibility and strengthening excercises and activity modification. The indications for the revision of the total knee arthroplasty are bearing surface wear leading to symptomatic synovitis. Onset of symptoms was gradual starting 2 years ago with gradually worsening course since that time.  Prior procedures on the right knee(s) include unicompartmental arthroplasty.  Patient currently rates pain in the right knee(s) at 8 out of 10 with activity. There is worsening of pain with activity and weight bearing, pain that interferes with activities of daily living and pain with passive range of motion. This condition presents safety issues increasing the risk of falls.  There is no current active infection.  Patient Active Problem List   Diagnosis Date Noted  . 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 Status post arthroscopy of left shoulder 11/19/2018  . Acid reflux 04/06/2018  . 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 Epistaxis, recurrent 11/08/2016  . Oral lesion 09/02/2016  . Degenerative arthritis of knee, bilateral 07/11/2016  . 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 Allergic rhinitis 12/22/2011   Past Medical History:  Diagnosis Date  . Arthritis   . Cancer ([place])   . History of chicken pox   . History of colon polyps   . History of mouth cancer   . Osteoarthritis of knee    Medial compartment, Right    Past Surgical History:  Procedure Laterality Date  . APPENDECTOMY    . DILATION AND CURETTAGE OF UTERUS    .  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 KNEE ARTHROSCOPY W/ MENISCAL REPAIR Left   . PARTIAL KNEE ARTHROPLASTY Right 09/16/2017  . PARTIAL KNEE ARTHROPLASTY Right 09/16/2017   Procedure: RIGHT UNICOMPARTMENTAL KNEE;  Surgeon: Mcarthur Rossetti, MD;  Location: [facility];  Service: Orthopedics;  Laterality: Right;  . TUBAL LIGATION      Current Facility-Administered Medications  Medication Dose Route Frequency Provider Last Rate Last Dose  . ceFAZolin (ANCEF) 2-4 GM/100ML-% IVPB           . ceFAZolin (ANCEF) IVPB 2g/100 mL premix  2 g Intravenous On Call to OR Pete Pelt, PA-C      . chlorhexidine (HIBICLENS) 4 % liquid 4 application  60 mL Topical Once Pete Pelt, PA-C      . lactated ringers infusion   Intravenous Continuous Josephine Igo, MD 10 mL/hr at 06/15/19 1025 1,000 mL at 06/15/19 1025  . midazolam (VERSED) 2 MG/2ML injection           . povidone-iodine 10 % swab 2 application  2 application Topical Once Erskine Emery W, PA-C      . tranexamic acid (CYKLOKAPRON) 1000MG /188mL IVPB           . tranexamic acid (CYKLOKAPRON) IVPB 1,000 mg  1,000 mg Intravenous To OR Pete Pelt, PA-C       Facility-Administered Medications Ordered in Other Encounters  Medication Dose Route Frequency Provider Last Rate Last Dose  . bupivacaine-epinephrine (MARCAINE W/ EPI) 0.5% -1:200000 injection    Anesthesia Intra-op Roderic Palau, MD   20 mL at 06/15/19 1121  Allergies  Allergen Reactions  . Ibuprofen Nausea And Vomiting and Other (See Comments)    GI upset, GERD    Social History   Tobacco Use  . Smoking status: Former Smoker    Quit date: 12/28/1986    Years since quitting: 32.4  . Smokeless tobacco: Never Used  Substance Use Topics  . Alcohol use: No    History reviewed. No pertinent family history.    Review of Systems  Musculoskeletal: Positive for joint pain.  All other systems reviewed and are negative.    Objective:  Physical Exam  Constitutional:  She is oriented to person, place, and time. She appears well-developed and well-nourished.  HENT:  Head: Normocephalic and atraumatic.  Eyes: Pupils are equal, round, and reactive to light. EOM are normal.  Neck: Normal range of motion. Neck supple.  Cardiovascular: Normal rate and regular rhythm.  Respiratory: Effort normal and breath sounds normal.  GI: Soft. Bowel sounds are normal.  Musculoskeletal:     Right knee: She exhibits bony tenderness. Tenderness found. Medial joint line, lateral joint line and patellar tendon tenderness noted.  Neurological: She is alert and oriented to person, place, and time.  Skin: Skin is warm and dry.  Psychiatric: She has a normal mood and affect.    Vital signs in last 24 hours: Temp:  [98.1 F (36.7 C)] 98.1 F (36.7 C) (09/08 1003) Pulse Rate:  [70-80] 73 (09/08 1135) Resp:  [14-18] 15 (09/08 1135) BP: (155-178)/(53-112) 174/63 (09/08 1135) SpO2:  [94 %-100 %] 100 % (09/08 1135) Weight:  [79.2 kg] 79.2 kg (09/08 1003)  Labs:  Estimated body mass index is 31.92 kg/m as calculated from the following:   Height as of this encounter: 5\' 2"  (1.575 m).   Weight as of this encounter: 79.2 kg.  Imaging Review Plain radiographs demonstrate a right partial medial compartment arthroplasty with no complicating features.   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 Revision total knee arthroplasty is deemed medically necessary. The treatment options including medical management, injection therapy, arthroscopy and revision arthroplasty were discussed at length. The risks and benefits of revision total knee arthroplasty were presented and reviewed. The risks due to aseptic loosening, infection,  stiffness, patella tracking problems, thromboembolic complications and other imponderables were discussed. The patient acknowledged the explanation, agreed to proceed with the plan and consent was signed. Patient is being admitted for inpatient treatment for surgery, pain control, PT, OT, prophylactic antibiotics, VTE prophylaxis, progressive ambulation and ADL's and discharge planning.The patient is planning to be discharged home with home health services

## 2019-06-15 NOTE — Anesthesia Procedure Notes (Signed)
Spinal  Patient location during procedure: OR Start time: 06/15/2019 11:54 AM End time: 06/15/2019 12:04 PM Staffing Anesthesiologist: Roderic Palau, MD Performed: anesthesiologist  Preanesthetic Checklist Completed: patient identified, surgical consent, pre-op evaluation, timeout performed, IV checked, risks and benefits discussed and monitors and equipment checked Spinal Block Patient position: sitting Prep: DuraPrep Patient monitoring: cardiac monitor, continuous pulse ox and blood pressure Approach: midline Location: L4-5 (Attempted L3-4. Unable to locate space.) Injection technique: single-shot Needle Needle type: Pencan  Needle gauge: 24 G Needle length: 9 cm Assessment Sensory level: T8 Additional Notes Functioning IV was confirmed and monitors were applied. Sterile prep and drape, including hand hygiene and sterile gloves were used. The patient was positioned and the spine was prepped. The skin was anesthetized with lidocaine.  Free flow of clear CSF was obtained prior to injecting local anesthetic into the CSF.  The spinal needle aspirated freely following injection.  The needle was carefully withdrawn.  The patient tolerated the procedure well.

## 2019-06-15 NOTE — Anesthesia Procedure Notes (Signed)
Anesthesia Regional Block: Adductor canal block   Pre-Anesthetic Checklist: ,, timeout performed, Correct Patient, Correct Site, Correct Laterality, Correct Procedure, Correct Position, site marked, Risks and benefits discussed, pre-op evaluation,  At surgeon's request and post-op pain management  Laterality: Right  Prep: Maximum Sterile Barrier Precautions used, chloraprep       Needles:  Injection technique: Single-shot  Needle Type: Echogenic Stimulator Needle     Needle Length: 9cm  Needle Gauge: 21     Additional Needles:   Procedures:,,,, ultrasound used (permanent image in chart),,,,  Narrative:  Start time: 06/15/2019 11:12 AM End time: 06/15/2019 11:22 AM Injection made incrementally with aspirations every 5 mL.  Performed by: Personally  Anesthesiologist: Roderic Palau, MD  Additional Notes: 2% Lidocaine skin wheel.

## 2019-06-15 NOTE — Anesthesia Preprocedure Evaluation (Addendum)
Anesthesia Evaluation  Patient identified by MRN, date of birth, ID band Patient awake    Reviewed: Allergy & Precautions, H&P , NPO status , Patient's Chart, lab work & pertinent test results  Airway Mallampati: II  TM Distance: >3 FB Neck ROM: Full    Dental no notable dental hx. (+) Edentulous Upper, Edentulous Lower, Dental Advisory Given   Pulmonary neg pulmonary ROS, former smoker,    Pulmonary exam normal breath sounds clear to auscultation       Cardiovascular negative cardio ROS   Rhythm:Regular Rate:Normal     Neuro/Psych negative neurological ROS  negative psych ROS   GI/Hepatic Neg liver ROS, GERD  Medicated and Controlled,  Endo/Other  negative endocrine ROS  Renal/GU negative Renal ROS  negative genitourinary   Musculoskeletal  (+) Arthritis , Osteoarthritis,    Abdominal   Peds  Hematology negative hematology ROS (+)   Anesthesia Other Findings   Reproductive/Obstetrics negative OB ROS                            Anesthesia Physical Anesthesia Plan  ASA: II  Anesthesia Plan: Spinal and MAC   Post-op Pain Management:  Regional for Post-op pain   Induction: Intravenous  PONV Risk Score and Plan: 2 and Propofol infusion, Ondansetron and Midazolam  Airway Management Planned: Simple Face Mask  Additional Equipment:   Intra-op Plan:   Post-operative Plan:   Informed Consent: I have reviewed the patients History and Physical, chart, labs and discussed the procedure including the risks, benefits and alternatives for the proposed anesthesia with the patient or authorized representative who has indicated his/her understanding and acceptance.     Dental advisory given  Plan Discussed with: CRNA  Anesthesia Plan Comments:        Anesthesia Quick Evaluation

## 2019-06-15 NOTE — Progress Notes (Signed)
Orthopedic Tech Progress Note Patient Details:  [name] 1940-12-28 [id number]  CPM Right Knee CPM Right Knee: On Right Knee Flexion (Degrees): 0 Right Knee Extension (Degrees): 60  Post Interventions Patient Tolerated: Well Instructions Provided: Care of device, Adjustment of device  Janit Pagan 06/15/2019, 4:52 PM

## 2019-06-15 NOTE — Brief Op Note (Signed)
06/15/2019  2:00 PM  PATIENT:  [name]  78 y.o. female  PRE-OPERATIVE DIAGNOSIS:  Osteoarthritis Right Knee  POST-OPERATIVE DIAGNOSIS:  Osteoarthritis Right Knee  PROCEDURE:  Procedure(s): RIGHT UNICOMPARTMENTAL ARTHROPLASTY TO TOTAL KNEE ARTHROPLASTY (Right)  SURGEON:  Surgeon(s) and Role:    Mcarthur Rossetti, MD - Primary  PHYSICIAN ASSISTANT: Benita Stabile, PA-C  ANESTHESIA:   regional and spinal  EBL:  150 mL   COUNTS:  YES  TOURNIQUET:   Total Tourniquet Time Documented: Thigh (Right) - 71 minutes Total: Thigh (Right) - 71 minutes   DICTATION: .Other Dictation: Dictation Number [id number]  PLAN OF CARE: Admit to inpatient   PATIENT DISPOSITION:  PACU - hemodynamically stable.   Delay start of Pharmacological VTE agent (>24hrs) due to surgical blood loss or risk of bleeding: no

## 2019-06-15 NOTE — Transfer of Care (Signed)
Immediate Anesthesia Transfer of Care Note  Patient: [name]  Procedure(s) Performed: RIGHT UNICOMPARTMENTAL ARTHROPLASTY TO TOTAL KNEE ARTHROPLASTY (Right Knee)  Patient Location: PACU  Anesthesia Type:Regional and Spinal  Level of Consciousness: awake, alert  and oriented  Airway & Oxygen Therapy: Patient Spontanous Breathing  Post-op Assessment: Report given to RN and Post -op Vital signs reviewed and stable  Post vital signs: Reviewed and stable  Last Vitals:  Vitals Value Taken Time  BP 140/78 06/15/19 1426  Temp 36.7 C 06/15/19 1425  Pulse 66 06/15/19 1426  Resp 17 06/15/19 1426  SpO2 100 % 06/15/19 1426  Vitals shown include unvalidated device data.  Last Pain:  Vitals:   06/15/19 1135  TempSrc:   PainSc: 0-No pain      Patients Stated Pain Goal: 3 ([phone] [id number])  Complications: No apparent anesthesia complications

## 2019-06-15 NOTE — Op Note (Signed)
NAME: [name], PLACHTA MEDICAL RECORD [id number] ACCOUNT [id number] DATE OF BIRTH:07/03/1941 FACILITY: [facility] LOCATION: [facility] PHYSICIAN:CHRISTOPHER Kerry Fort, MD  OPERATIVE REPORT  DATE OF PROCEDURE:  06/15/2019  PREOPERATIVE DIAGNOSIS:  Painful right unicompartmental medial compartment knee arthroplasty.  POSTOPERATIVE DIAGNOSIS:  Painful right unicompartmental medial compartment knee arthroplasty.  PROCEDURE:  Revision of a right unicompartmental knee to a total knee arthroplasty.  IMPLANTS:  Stryker Triathlon knee system with size 3 cemented femur, size 2 cemented universal baseplate, size 13 TS polyethylene insert, size 27 symmetric patellar button.  SURGEON:  Jonn Shingles, MD  ASSISTANT:  Erskine Emery, PA-C  ANESTHESIA:   1.  Right lower extremity adductor canal block. 2.  Spinal.  TOURNIQUET TIME:  Under an hour and a half.  ESTIMATED BLOOD LOSS:  Less than 100 mL.  COMPLICATIONS:  None.  INDICATIONS:  The patient is a very pleasant 78 year old female who underwent a right medial compartment unicompartmental knee arthroplasty 2 years ago.  She continues to be plagued by pain with that right knee.  She has not had any radiographic evidence  of loosening of the components.  There has been no instability in the knee and no effusion.  However, she continues to have pain and has gotten to the point where she was having some patellofemoral arthritic symptoms and she has global tenderness and  she still reports some swelling even though we have never able to see that in the office in followup.  In light of this for her continued pain necessitates the need to revise this to a total knee arthroplasty.  I surmised there may be some micromotion of  the medial compartment in plantar and some recurrent synovitis that is bothering her enough that I agree at this point, we would proceed to removing the partial knee arthroplasty and converting to a total knee arthroplasty.   I had a long and thorough  discussion with her.  We talked about the risk of acute blood loss anemia, nerve or vessel injury, fracture, infection, DVT, and implant failure.  We talked about the goals being hopefully to decrease pain, improve mobility and overall improve quality of  life.  DESCRIPTION OF PROCEDURE:  After informed consent was obtained and appropriate right knee was marked, an adductor canal block was obtained in the holding room.  She was then brought to the operating room and sat up on the operating table where spinal  anesthesia was obtained.  She was then laid in supine position on the operating table.  Foley catheter was placed and nonsterile tourniquet was placed around her upper right thigh.  Her right thigh, knee, leg, ankle and foot were prepped and draped with  DuraPrep and sterile drapes.  A time-out was called.  She was identified as correct patient, correct right knee.  We then decided to make a new incision and not go through her previous medial incision that had healed nicely.  We made a longitudinal  incision over the patella and carried this proximally and distally.  We dissected down the knee joint and carried out a medial parapatellar arthrotomy and found a mild joint effusion and some synovitis in the superior patellar compartment.  We removed  the synovitis and with the knee in a flexed position, we removed remnants of ACL, PCL, medial and lateral meniscus.  We found minimal cartilage changes in her knee.  We then assessed the components and we did not find any loosening that we could see  grossly.  We removed the  polyethylene insert and then were able to remove the tibial component and the femoral component without causing much bone loss.  These were removed, but there was still a cement mantle so it was still hard to tell was there any  micromotion that was causing her pain.  We then proceeded with our total knee arthroplasty.  We used extramedullary cutting guide for  freshening up our tibia cut, taking 9 mm off the high side, correcting varus and valgus in a neutral slope.  We used an  intramedullary guide for the femur and did an 8 mm distal femoral cut based on right knee at 5 degrees externally rotated.  We brought the knee back down to full extension and it took an 11-13 mm extension block and achieved full extension.  We then went  back to the femur and used our femoral sizing guide based off the epicondylar axis and chose a size 3 femur.  We put our 4-in-1 cutting block for a size 3 femur, made our anterior and posterior cuts, followed by our chamfer cuts.  We then made our  femoral box cut.  We next chose a size 2 tibia for coverage of the tibial plateau and did our keel punch and then drilled for universal baseplate.  We felt that the depth of that would be adequate.  With a trial size 2 tibial tray followed by the size 3  right femur, we went up to a 13 mm polyethylene insert trial and that gave [place] stability with the knee.  We then made our patellar cut and drilled 3 holes for a size 29 but only 7 mm thickness central patellar button due to the size of her patella.  With  all trial instrumentation in place, we put the knee through range of motion and we were pleased with stability and range of motion.  We then removed all instrumentation from the knee and irrigated the knee with normal saline solution using pulsatile  lavage.  We dried the knee real well and mixed our cement.  We then cemented our real Stryker Triathlon tibial tray with the universal baseplate size 2 followed by real size 3 right femur.  We placed our fixed bearing TS size 13 polyethylene insert and  cemented our patellar button.  Once the cement had hardened, I put the knee through range of motion and again appreciated the stability.  We let the tourniquet down.  Hemostasis obtained with electrocautery.  We then closed the arthrotomy with  interrupted #1 Vicryl suture.  We closed the deep  tissue with 0 Vicryl followed by closing the subcutaneous tissue with 2-0 Vicryl.  A 4-0 Monocryl subcuticular stitch was used on the skin.  A well-padded sterile dressing was applied.  She was taken to  recovery room in stable condition.  All final counts were correct.  There were no complications noted.  Of note, Benita Stabile, PA-C, assisted in the entire case.  His assistance was crucial for facilitating all aspects of this case.  TN/NUANCE  D:06/15/2019 T:06/15/2019 JOB:[id number]

## 2019-06-16 LAB — CBC
HCT: 36.4 % (ref 36.0–46.0)
Hemoglobin: 12 g/dL (ref 12.0–15.0)
MCH: 30 pg (ref 26.0–34.0)
MCHC: 33 g/dL (ref 30.0–36.0)
MCV: 91 fL (ref 80.0–100.0)
Platelets: 156 10*3/uL (ref 150–400)
RBC: 4 MIL/uL (ref 3.87–5.11)
RDW: 12.9 % (ref 11.5–15.5)
WBC: 8.7 10*3/uL (ref 4.0–10.5)
nRBC: 0 % (ref 0.0–0.2)

## 2019-06-16 LAB — BASIC METABOLIC PANEL
Anion gap: 6 (ref 5–15)
BUN: 11 mg/dL (ref 8–23)
CO2: 28 mmol/L (ref 22–32)
Calcium: 8.4 mg/dL — ABNORMAL LOW (ref 8.9–10.3)
Chloride: 100 mmol/L (ref 98–111)
Creatinine, Ser: 0.66 mg/dL (ref 0.44–1.00)
GFR calc Af Amer: >60 mL/min (ref >60)
GFR calc non Af Amer: >60 mL/min (ref >60)
Glucose, Bld: 143 mg/dL — ABNORMAL HIGH (ref 70–99)
Potassium: 4 mmol/L (ref 3.5–5.1)
Sodium: 134 mmol/L — ABNORMAL LOW (ref 135–145)

## 2019-06-16 NOTE — Progress Notes (Signed)
Subjective: 1 Day Post-Op Procedure(s) (LRB): RIGHT UNICOMPARTMENTAL ARTHROPLASTY TO TOTAL KNEE ARTHROPLASTY (Right) Patient reports pain as moderate.    Objective: Vital signs in last 24 hours: Temp:  [98 F (36.7 C)-99.5 F (37.5 C)] 99.5 F (37.5 C) (09/09 0434) Pulse Rate:  [65-98] 98 (09/09 0434) Resp:  [10-26] 18 (09/09 0434) BP: (108-178)/(42-112) 127/58 (09/09 0434) SpO2:  [89 %-100 %] 94 % (09/09 0500) Weight:  [79.2 kg] 79.2 kg (09/08 1003)  Intake/Output from previous day: 09/08 0701 - 09/09 0700 In: 2400 [P.O.:400; I.V.:1950; IV Piggyback:50] Out: 1200 [Urine:1050; Blood:150] Intake/Output this shift: No intake/output data recorded.  Recent Labs    06/16/19 0337  HGB 12.0   Recent Labs    06/16/19 0337  WBC 8.7  RBC 4.00  HCT 36.4  PLT 156   Recent Labs    06/16/19 0337  NA 134*  K 4.0  CL 100  CO2 28  BUN 11  CREATININE 0.66  GLUCOSE 143*  CALCIUM 8.4*   No results for input(s): LABPT, INR in the last 72 hours.  Sensation intact distally Intact pulses distally Dorsiflexion/Plantar flexion intact Incision: dressing C/D/I Compartment soft   Assessment/Plan: 1 Day Post-Op Procedure(s) (LRB): RIGHT UNICOMPARTMENTAL ARTHROPLASTY TO TOTAL KNEE ARTHROPLASTY (Right) Up with therapy   Anticipated LOS equal to or greater than 2 midnights due to - Age 24 and older with one or more of the following:  - Obesity  - Expected need for hospital services (PT, OT, Nursing) required for safe  discharge  - Anticipated need for postoperative skilled nursing care or inpatient rehab  - Active co-morbidities: None OR   - Unanticipated findings during/Post Surgery: None  - Patient is a high risk of re-admission due to: None    Mcarthur Rossetti 06/16/2019, 7:12 AM

## 2019-06-16 NOTE — Discharge Instructions (Signed)

## 2019-06-16 NOTE — Evaluation (Signed)
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 PMH oral CA, partial TKR, bilateral carpal tunnel, L shoulder arthroscopy  Clinical Impression   Patient received in bed, very pleasant and willing to participate with therapy today. Able to complete bed mobility with MinA for LE support, and did require MinA for functional transfers for balance as well as cues for hand placement and sequencing, then able to gait train approximately 35f total with RW and min guard, cues for sequencing with RW. Able to thread panties with totalA, however required min guard to pull them up around hips with no UE support. HR between 100-120BPM during session on room air, SpO2 above 90% on room air with activity. She was left up in the chair with all needs met and positioned to comfort this morning. She will continue to benefit from skilled PT services in the acute setting, recommend continued therapy as per surgeon's POC moving forward.     Follow Up Recommendations Follow surgeon's recommendation for DC plan and follow-up therapies    Equipment Recommendations  None recommended by PT(has all necessary DME)    Recommendations for Other Services       Precautions / Restrictions Precautions Precautions: Fall;Other (comment) Precaution Comments: watch HR Restrictions Weight Bearing Restrictions: Yes RLE Weight Bearing: Weight bearing as tolerated      Mobility  Bed Mobility Overal bed mobility: Needs Assistance Bed Mobility: Supine to Sit     Supine to sit: Min assist     General bed mobility comments: to support surgical LE  Transfers Overall transfer level: Needs assistance Equipment used: Rolling walker (2 wheeled) Transfers: Sit to/from Stand Sit to Stand: Min assist         General transfer comment: MinA for  balance, otherwise no physical assist; cues for sequencing and hand placement  Ambulation/Gait Ambulation/Gait assistance: Min guard Gait Distance (Feet): 40 Feet Assistive device: Rolling walker (2 wheeled) Gait Pattern/deviations: Step-through pattern;Decreased step length - left;Decreased stance time - right;Decreased dorsiflexion - right;Decreased weight shift to right Gait velocity: decreased   General Gait Details: cues for sequencing with RW, cues for safety and sequencing with RW  Stairs            Wheelchair Mobility    Modified Rankin (Stroke Patients Only)       Balance Overall balance assessment: Mild deficits observed, not formally tested                                           Pertinent Vitals/Pain Pain Assessment: 0-10 Pain Score: 3  Pain Location: R knee Pain Descriptors / Indicators: Aching;Sore Pain Intervention(s): Limited activity within patient's tolerance;Monitored during session;Repositioned;Premedicated before session    [place]s to be discharged to:: Private residence Living Arrangements: Spouse/significant other Available Help at Discharge: Family;Available 24 hours/day Type of Home: House Home Access: Stairs to enter Entrance Stairs-Rails: None Entrance Stairs-Number of Steps: 2 Home Layout: One level Home Equipment: Walker - 2 wheels;Cane - single point      Prior Function Level of Independence: Independent               Hand Dominance   Dominant Hand: Right    Extremity/Trunk Assessment   Upper Extremity Assessment  Upper Extremity Assessment: Generalized weakness    Lower Extremity Assessment Lower Extremity Assessment: Generalized weakness    Cervical / Trunk Assessment Cervical / Trunk Assessment: Kyphotic  Communication   Communication: No difficulties  Cognition Arousal/Alertness: Awake/alert Behavior During Therapy: WFL for tasks assessed/performed Overall  Cognitive Status: Within Functional Limits for tasks assessed                                        General Comments      Exercises     Assessment/Plan    PT Assessment Patient needs continued PT services  PT Problem List Decreased strength;Decreased mobility;Decreased safety awareness;Decreased coordination;Decreased range of motion;Decreased activity tolerance;Decreased balance;Pain       PT Treatment Interventions DME instruction;Therapeutic activities;Gait training;Therapeutic exercise;Patient/family education;Stair training;Balance training;Functional mobility training;Neuromuscular re-education    PT Goals (Current goals can be found in the Care Plan section)  Acute Rehab PT Goals Patient Stated Goal: home/regain independence PT Goal Formulation: With patient Time For Goal Achievement: 06/30/19 Potential to Achieve Goals: Good    Frequency 7X/week   Barriers to discharge        Co-evaluation               AM-PAC PT "6 Clicks" Mobility  Outcome Measure Help needed turning from your back to your side while in a flat bed without using bedrails?: A Little Help needed moving from lying on your back to sitting on the side of a flat bed without using bedrails?: A Little Help needed moving to and from a bed to a chair (including a wheelchair)?: A Little Help needed standing up from a chair using your arms (e.g., wheelchair or bedside chair)?: A Little Help needed to walk in hospital room?: A Little Help needed climbing 3-5 steps with a railing? : A Lot 6 Click Score: 17    End of Session   Activity Tolerance: Patient tolerated treatment well Patient left: in chair;with call bell/phone within reach   PT Visit Diagnosis: Unsteadiness on feet (R26.81);Muscle weakness (generalized) (M62.81);Pain;Difficulty in walking, not elsewhere classified (R26.2) Pain - Right/Left: Right Pain - part of body: Knee    Time: 9675-9163 PT Time Calculation (min)  (ACUTE ONLY): 38 min   Charges:   PT Evaluation $PT Eval Low Complexity: 1 Low PT Treatments $Gait Training: 8-22 mins $Therapeutic Activity: 8-22 mins        Deniece Ree PT, DPT, CBIS  Supplemental Physical Therapist [place]    Pager [phone] Acute Rehab Office [phone]

## 2019-06-16 NOTE — Progress Notes (Signed)
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 PMH oral CA, partial TKR, bilateral carpal tunnel, L shoulder arthroscopy    PT Comments    Per RN, patient likely very low risk of having clot and OK for PT to work with her this afternoon. Focused on functional exercises due to patient's high pain levels and reluctance to get out of bed due to pain. Estimated ROM to be 10 degrees extension to 35 degrees flexion due to pain. Answered all questions and concerns, continued to educate regarding typical pain patterns following TKR as well as pain management and ice. She was left in bed with all needs met, bed alarm active, and family present this afternoon.   Follow Up Recommendations  Follow surgeon's recommendation for DC plan and follow-up therapies     Equipment Recommendations  None recommended by PT    Recommendations for Other Services       Precautions / Restrictions Precautions Precautions: Fall;Other (comment) Precaution Comments: watch HR Restrictions Weight Bearing Restrictions: Yes RLE Weight Bearing: Weight bearing as tolerated    Mobility  Bed Mobility               General bed mobility comments: deferred OOB due to pain  Transfers                 General transfer comment: deferred OOB due to pain  Ambulation/Gait             General Gait Details: deferred OOB due to pain   Stairs             Wheelchair Mobility    Modified Rankin (Stroke Patients Only)       Balance Overall balance assessment: Mild deficits observed, not formally tested                                          Cognition Arousal/Alertness: Awake/alert Behavior During Therapy: WFL for tasks assessed/performed Overall Cognitive Status: Within Functional Limits for  tasks assessed                                        Exercises Total Joint Exercises Ankle Circles/Pumps: Right;10 reps;Supine Quad Sets: Right;10 reps;Supine Towel Squeeze: Both;10 reps;Supine Short Arc Quad: Right;5 reps;Supine Heel Slides: Right;10 reps;Supine Hip ABduction/ADduction: Right;10 reps;Supine Straight Leg Raises: Right;5 reps;Supine Goniometric ROM: approximately 10 degrees extension, 35 degrees flexion    General Comments        Pertinent Vitals/Pain Pain Assessment: 0-10 Pain Score: 8  Pain Location: R leg Pain Descriptors / Indicators: Aching;Sore Pain Intervention(s): Limited activity within patient's tolerance;Monitored during session;Premedicated before session    Home Living                      Prior Function            PT Goals (current goals can now be found in the care plan section) Acute Rehab PT Goals Patient Stated Goal: home/regain independence PT Goal Formulation: With patient Time For Goal Achievement: 06/30/19 Potential to Achieve Goals: Good Progress towards PT goals: Not progressing toward goals - comment(limited by pain  this afternoon)    Frequency    7X/week      PT Plan Current plan remains appropriate    Co-evaluation              AM-PAC PT "6 Clicks" Mobility   Outcome Measure  Help needed turning from your back to your side while in a flat bed without using bedrails?: A Little Help needed moving from lying on your back to sitting on the side of a flat bed without using bedrails?: A Little Help needed moving to and from a bed to a chair (including a wheelchair)?: A Little Help needed standing up from a chair using your arms (e.g., wheelchair or bedside chair)?: A Little Help needed to walk in hospital room?: A Little Help needed climbing 3-5 steps with a railing? : A Lot 6 Click Score: 17    End of Session   Activity Tolerance: Patient limited by pain Patient left: in bed;with  call bell/phone within reach;with bed alarm set;with family/visitor present   PT Visit Diagnosis: Unsteadiness on feet (R26.81);Muscle weakness (generalized) (M62.81);Pain;Difficulty in walking, not elsewhere classified (R26.2) Pain - Right/Left: Right Pain - part of body: Knee     Time: 4098-1191 PT Time Calculation (min) (ACUTE ONLY): 24 min  Charges:  $Therapeutic Exercise: 23-37 mins                     Deniece Ree PT, DPT, CBIS  Supplemental Physical Therapist [place]    Pager [phone] Acute Rehab Office [phone]

## 2019-06-16 NOTE — Progress Notes (Signed)
PT Cancellation Note  Patient Details Name: [name] MRN: [id number] DOB: 01-Feb-1941   Cancelled Treatment:    Reason Eval/Treat Not Completed: Other (comment) patient in bed, stating she has had pain since her sock stuck on the floor during a transfer and pulled her ankle earlier today. Noted mild swelling lateral malleous, but no tenderness to palpation on plantar or dorsal surfaces of foot, no tenderness medial ankle, very mild tenderness lateral ankle. She reports that most of her pain is in the calf, and did have pain when muscle belly of calf was squeezed by therapist today.   Pain pattern in muscle belly of calf somewhat concerning for possible clot and not consistent with mechanism of sock sticking to floor earlier. Spoke to RN who examined patient and reports minimal concern for clot.   Will return later this PM for second PT session.    Deniece Ree PT, DPT, CBIS  Supplemental Physical Therapist [facility]    Pager [phone] Acute Rehab Office [phone]

## 2019-06-17 MED ORDER — OXYCODONE HCL 5 MG PO TABS: 5.0000 mg | ORAL_TABLET | ORAL | 0 refills | Status: DC | PRN

## 2019-06-17 MED ORDER — METHOCARBAMOL 500 MG PO TABS: 500.0000 mg | ORAL_TABLET | Freq: Four times a day (QID) | ORAL | 1 refills | Status: DC | PRN

## 2019-06-17 MED ORDER — ASPIRIN 81 MG PO CHEW: 81.0000 mg | CHEWABLE_TABLET | Freq: Two times a day (BID) | ORAL | 0 refills | Status: DC

## 2019-06-17 NOTE — Progress Notes (Signed)
RN gave pt discharge instructions and she stated understanding. belongings packed. IV has been removed, prescriptions escribed to home pharmacy

## 2019-06-17 NOTE — Progress Notes (Signed)
Physical Therapy Treatment Patient Details Name: [name] MRN: [id number] DOB: Sep 16, 1941 Today's Date: 06/17/2019    History of Present Illness Pt is a 78 y.o. 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 PMH oral CA, partial R TKR, bilateral carpal tunnel, L shoulder arthroscopy.   PT Comments    Pt progressing well with mobility. Ambulating with RW at supervision-level; able to ascend/descend stairs with RW. Reviewed educ re: precautions, positioning, therex, fall risk reduction, importance of mobility and knee ROM. Pt familiar with this from previous knee sx in 2018. Planning to d/c home with husband this afternoon; owns necessary DME.    Follow Up Recommendations  Follow surgeon's recommendation for DC plan and follow-up therapies;Home health PT     Equipment Recommendations  None recommended by PT    Recommendations for Other Services       Precautions / Restrictions Precautions Precautions: Fall;Knee Restrictions Weight Bearing Restrictions: Yes RLE Weight Bearing: Weight bearing as tolerated    Mobility  Bed Mobility Overal bed mobility: Modified Independent Bed Mobility: Supine to Sit           General bed mobility comments: Received sitting in recliner  Transfers Overall transfer level: Needs assistance Equipment used: Rolling walker (2 wheeled) Transfers: Sit to/from Stand Sit to Stand: Supervision         General transfer comment: Cues for hand placement, stability improved; supervision for safety  Ambulation/Gait Ambulation/Gait assistance: Supervision Gait Distance (Feet): 250 Feet Assistive device: Rolling walker (2 wheeled) Gait Pattern/deviations: Step-through pattern;Decreased stride length;Decreased weight shift to right Gait velocity: Decreased Gait velocity interpretation: <1.8 ft/sec, indicate of risk for recurrent falls General Gait Details: Slow, antalgic gait with RW, supervision due to fall risk;  heel-to-toe gait pattern and gait speed improving with distance   Stairs Stairs: Yes Stairs assistance: Min assist(assist to hold RW) Stair Management: Step to pattern;Backwards;Forwards;With walker Number of Stairs: 3 General stair comments: Ascend 3 steps backwards and descend forwards with RW, only requiring assist to stabilize RW; pt familiar with this technique from previous knee sx, did not require any cues for technique   Wheelchair Mobility    Modified Rankin (Stroke Patients Only)       Balance Overall balance assessment: Needs assistance   Sitting balance-Leahy Scale: Good       Standing balance-Leahy Scale: Poor Standing balance comment: Reliant on UE support                            Cognition Arousal/Alertness: Awake/alert Behavior During Therapy: WFL for tasks assessed/performed Overall Cognitive Status: Within Functional Limits for tasks assessed                                        Exercises Total Joint Exercises Long Arc Quad: AROM;Right;Seated Knee Flexion: AAROM;Right;Seated(use of LLE for added stretch to R knee)    General Comments General comments (skin integrity, edema, etc.): Husband present during session      Pertinent Vitals/Pain Pain Assessment: Faces Faces Pain Scale: Hurts a little bit Pain Location: R knee Pain Descriptors / Indicators: Grimacing Pain Intervention(s): Monitored during session    Home Living                      Prior Function  PT Goals (current goals can now be found in the care plan section) Acute Rehab PT Goals Patient Stated Goal: home/regain independence PT Goal Formulation: With patient Time For Goal Achievement: 06/30/19 Potential to Achieve Goals: Good Progress towards PT goals: Progressing toward goals    Frequency    7X/week      PT Plan Current plan remains appropriate    Co-evaluation              AM-PAC PT "6 Clicks" Mobility    Outcome Measure  Help needed turning from your back to your side while in a flat bed without using bedrails?: None Help needed moving from lying on your back to sitting on the side of a flat bed without using bedrails?: None Help needed moving to and from a bed to a chair (including a wheelchair)?: None Help needed standing up from a chair using your arms (e.g., wheelchair or bedside chair)?: None Help needed to walk in hospital room?: A Little Help needed climbing 3-5 steps with a railing? : A Little 6 Click Score: 22    End of Session Equipment Utilized During Treatment: Gait belt Activity Tolerance: Patient tolerated treatment well Patient left: in chair;with call bell/phone within reach;with family/visitor present Nurse Communication: Mobility status PT Visit Diagnosis: Unsteadiness on feet (R26.81);Muscle weakness (generalized) (M62.81);Pain;Difficulty in walking, not elsewhere classified (R26.2) Pain - Right/Left: Right Pain - part of body: Knee     Time: [id number] PT Time Calculation (min) (ACUTE ONLY): 19 min  Charges:  $Gait Training: 8-22 mins $Therapeutic Exercise: 8-22 mins                    Mabeline Caras, PT, DPT Acute Rehabilitation Services  Pager [phone] Office [place] 06/17/2019, 1:32 PM

## 2019-06-17 NOTE — Progress Notes (Signed)
Physical Therapy Treatment Patient Details Name: [name] MRN: [id number] DOB: 23-Jul-1941 Today's Date: 06/17/2019    History of Present Illness Pt is a 78 y.o. 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 PMH oral CA, partial R TKR, bilateral carpal tunnel, L shoulder arthroscopy.   PT Comments    Pt progressing well with mobility. Able to perform transfers and hallway ambulation with RW and intermittent min guard. Reviewed seated LE therex for improved knee ROM. Husband present and supportive. Will plan for additional session for stair training, then pt will be ready to d/c home. Recommend follow-up with HHPT services.    Follow Up Recommendations  Follow surgeon's recommendation for DC plan and follow-up therapies;Home health PT     Equipment Recommendations  None recommended by PT(owns DME)    Recommendations for Other Services       Precautions / Restrictions Precautions Precautions: Fall Restrictions Weight Bearing Restrictions: Yes RLE Weight Bearing: Weight bearing as tolerated    Mobility  Bed Mobility Overal bed mobility: Modified Independent Bed Mobility: Supine to Sit           General bed mobility comments: Increased time and effort with HOB elevated, reliant on LLE to assist RLE to EOB  Transfers Overall transfer level: Needs assistance Equipment used: Rolling walker (2 wheeled) Transfers: Sit to/from Stand Sit to Stand: Min guard         General transfer comment: Pt cued for correct hand placement but still pulling on RW; min guard for safety  Ambulation/Gait Ambulation/Gait assistance: Supervision;Min guard Gait Distance (Feet): 220 Feet Assistive device: Rolling walker (2 wheeled) Gait Pattern/deviations: Step-through pattern;Decreased stride length;Decreased weight shift to right Gait velocity: Decreased Gait velocity interpretation: <1.8 ft/sec, indicate of risk for recurrent falls General Gait Details:  Slow, antalgic gait with RW and intermittent min guard for balance; heel-to-toe gait pattern and gait speed improving with distance   Stairs             Wheelchair Mobility    Modified Rankin (Stroke Patients Only)       Balance Overall balance assessment: Needs assistance   Sitting balance-Leahy Scale: Good       Standing balance-Leahy Scale: Poor Standing balance comment: Reliant on UE support                            Cognition Arousal/Alertness: Awake/alert Behavior During Therapy: WFL for tasks assessed/performed Overall Cognitive Status: Within Functional Limits for tasks assessed                                        Exercises Total Joint Exercises Long Arc Quad: AROM;Right;Seated Knee Flexion: AAROM;Right;Seated(use of LLE for added stretch to R knee)    General Comments General comments (skin integrity, edema, etc.): Husband present during session      Pertinent Vitals/Pain Pain Assessment: Faces Faces Pain Scale: Hurts little more Pain Location: R knee Pain Descriptors / Indicators: Sore;Operative site guarding Pain Intervention(s): Monitored during session    Home Living                      Prior Function            PT Goals (current goals can now be found in the care plan section) Acute Rehab PT Goals Patient Stated Goal:  home/regain independence PT Goal Formulation: With patient Time For Goal Achievement: 06/30/19 Potential to Achieve Goals: Good Progress towards PT goals: Progressing toward goals    Frequency    7X/week      PT Plan Current plan remains appropriate    Co-evaluation              AM-PAC PT "6 Clicks" Mobility   Outcome Measure  Help needed turning from your back to your side while in a flat bed without using bedrails?: None Help needed moving from lying on your back to sitting on the side of a flat bed without using bedrails?: None Help needed moving to and from  a bed to a chair (including a wheelchair)?: A Little Help needed standing up from a chair using your arms (e.g., wheelchair or bedside chair)?: A Little Help needed to walk in hospital room?: A Little Help needed climbing 3-5 steps with a railing? : A Little 6 Click Score: 20    End of Session Equipment Utilized During Treatment: Gait belt Activity Tolerance: Patient tolerated treatment well Patient left: in chair;with call bell/phone within reach;with family/visitor present Nurse Communication: Mobility status PT Visit Diagnosis: Unsteadiness on feet (R26.81);Muscle weakness (generalized) (M62.81);Pain;Difficulty in walking, not elsewhere classified (R26.2) Pain - Right/Left: Right Pain - part of body: Knee     Time: 1023-1050 PT Time Calculation (min) (ACUTE ONLY): 27 min  Charges:  $Gait Training: 8-22 mins $Therapeutic Exercise: 8-22 mins                    Mabeline Caras, PT, DPT Acute Rehabilitation Services  Pager [phone] Office [place] 06/17/2019, 11:04 AM

## 2019-06-17 NOTE — Clinical Social Work Note (Signed)
CSW received consult for home health for patient. Patient has had home health with [facility] and [facility] was contacted and advised that patient discharging today, and per Jonelle Sidle they are ready to begin services with patient. Called patient regarding DME needs and Mrs. Guadalupe reported that she has a walker, 3-in-1 and cane, and when asked, patient responded that she does not need any other equipment. Mrs. Dhawan informed CSW that her husband is in the room and will transport her home. CSW signing off as no other SW intervention services needed.   Sagan Maselli Givens, MSW, LCSW Licensed Clinical Social Worker [place] [phone]

## 2019-06-17 NOTE — Discharge Summary (Signed)
Patient ID: [name] MRN: [id number] DOB/AGE: 78-Nov-1942 78 y.o.  Admit date: 06/15/2019 Discharge date: 06/17/2019  Admission Diagnoses:  Principal Problem:   Pain due to unicompartmental arthroplasty of knee [facility]) Active Problems:   Status post revision of total replacement of right knee   Discharge Diagnoses:  Same  Past Medical History:  Diagnosis Date  . Arthritis   . Cancer ([place])   . History of chicken pox   . History of colon polyps   . History of mouth cancer   . Osteoarthritis of knee    Medial compartment, Right    Surgeries: Procedure(s): RIGHT UNICOMPARTMENTAL ARTHROPLASTY TO TOTAL KNEE ARTHROPLASTY on 06/15/2019   Consultants:   Discharged Condition: Improved  Hospital Course: [name] is an 78 y.o. female who was admitted 06/15/2019 for operative treatment ofPain due to unicompartmental arthroplasty of knee ([place]). Patient has severe unremitting pain that affects sleep, daily activities, and work/hobbies. After pre-op clearance the patient was taken to the operating room on 06/15/2019 and underwent  Procedure(s): RIGHT UNICOMPARTMENTAL ARTHROPLASTY TO TOTAL KNEE ARTHROPLASTY.    Patient was given perioperative antibiotics:  Anti-infectives (From admission, onward)   Start     Dose/Rate Route Frequency Ordered Stop   06/15/19 1845  ceFAZolin (ANCEF) IVPB 1 g/50 mL premix     1 g 100 mL/hr over 30 Minutes Intravenous Every 6 hours 06/15/19 1835 06/16/19 0644   06/15/19 1027  ceFAZolin (ANCEF) 2-4 GM/100ML-% IVPB    Note to Pharmacy: Marga Melnick   : cabinet override      06/15/19 1027 06/15/19 1208   06/15/19 1000  ceFAZolin (ANCEF) IVPB 2g/100 mL premix     2 g 200 mL/hr over 30 Minutes Intravenous On call to O.R. 06/15/19 [id number] 06/15/19 1208       Patient was given sequential compression devices, early ambulation, and chemoprophylaxis to prevent DVT.  Patient benefited maximally from hospital stay and there were no complications.    Recent  vital signs:  Patient Vitals for the past 24 hrs:  BP Temp Temp src Pulse Resp SpO2  06/17/19 0826 (!) 123/50 99.9 F (37.7 C) Oral 100 16 94 %  06/17/19 0527 (!) 145/55 100.2 F (37.9 C) Oral (!) 101 14 90 %  06/16/19 2026 (!) 137/40 98.8 F (37.1 C) Oral (!) 103 16 93 %  06/16/19 1506 (!) 153/51 99.4 F (37.4 C) Oral 99 18 93 %     Recent laboratory studies:  Recent Labs    06/16/19 0337  WBC 8.7  HGB 12.0  HCT 36.4  PLT 156  NA 134*  K 4.0  CL 100  CO2 28  BUN 11  CREATININE 0.66  GLUCOSE 143*  CALCIUM 8.4*     Discharge Medications:   Allergies as of 06/17/2019      Reactions   Ibuprofen Nausea And Vomiting, Other (See Comments)   GI upset, GERD      Medication List    STOP taking these medications   acetaminophen 650 MG CR tablet Commonly known as: TYLENOL   aspirin EC 81 MG tablet Replaced by: aspirin 81 MG chewable tablet     TAKE these medications   aspirin 81 MG chewable tablet Chew 1 tablet (81 mg total) by mouth 2 (two) times daily. Replaces: aspirin EC 81 MG tablet   Calcium 600/Vitamin D3 600-800 MG-UNIT Tabs Generic drug: Calcium Carb-Cholecalciferol Take 1 tablet by mouth 2 (two) times daily.   cetirizine 10 MG tablet Commonly known as:  ZYRTEC Take 10 mg by mouth daily.   chlorhexidine 0.12 % solution Commonly known as: PERIDEX Use as directed 15 mLs in the mouth or throat 2 (two) times daily.   COSAMIN DS PO Take 1 tablet by mouth 2 (two) times daily.   DULoxetine 20 MG capsule Commonly known as: CYMBALTA Take 20 mg by mouth daily.   Fish Oil 1000 MG Caps Take 1,000 mg by mouth daily.   fluticasone 50 MCG/ACT nasal spray Commonly known as: FLONASE Place 2 sprays into the nose at bedtime.   gabapentin 300 MG capsule Commonly known as: NEURONTIN Take 1 capsule (300 mg total) by mouth at bedtime.   methocarbamol 500 MG tablet Commonly known as: ROBAXIN Take 1 tablet (500 mg total) by mouth every 6 (six) hours as needed  for muscle spasms.   multivitamin with minerals tablet Take 1 tablet by mouth every evening. Women's One-A-Day   NexIUM 20 MG capsule Generic drug: esomeprazole Take 20 mg by mouth daily before breakfast.   oxyCODONE 5 MG immediate release tablet Commonly known as: Oxy IR/ROXICODONE Take 1-2 tablets (5-10 mg total) by mouth every 4 (four) hours as needed for moderate pain (pain score 4-6).   TURMERIC PO Take 538 mg by mouth daily.            Durable Medical Equipment  (From admission, onward)         Start     Ordered   06/15/19 1836  DME 3 n 1  Once     06/15/19 1835   06/15/19 1836  DME Walker rolling  Once    Question:  Patient needs a walker to treat with the following condition  Answer:  Status post revision of total replacement of right knee   06/15/19 1835          Diagnostic Studies: Dg Knee Right Port  Result Date: 06/15/2019 CLINICAL DATA:  78 year old female status post total right knee arthroplasty. EXAM: PORTABLE RIGHT KNEE - 1-2 VIEW COMPARISON:  Knee radiograph dated 08/14/2018 FINDINGS: There is a total right knee arthroplasty. The arthroplasty components appear intact and in anatomic alignment. No evidence of loosening. The bones are osteopenic. There is no acute fracture or dislocation. Probable small suprapatellar effusion. Small pockets of air in the soft tissues of the anterior knee, likely postsurgical. IMPRESSION: Total right knee arthroplasty appears intact and in anatomic alignment. No complication. Electronically Signed   By: Anner Crete M.D.   On: 06/15/2019 19:11   Xr Lumbar Spine 2-3 Views  Result Date: 06/09/2019 2 views of the lumbar spine show no acute findings.  There is overall good alignment.  The bone is slightly osteopenic.  There is degenerative changes at several levels.   Disposition: Discharge disposition: 01-Home or Self Care         Follow-up Information    Mcarthur Rossetti, MD. Schedule an appointment as  soon as possible for a visit in 2 week(s).   Specialty: Orthopedic Surgery Contact information: [place] [address] [phone]            Signed: Erskine Emery 06/17/2019, 9:52 AM

## 2019-06-17 NOTE — Progress Notes (Signed)
Subjective: 2 Days Post-Op Procedure(s) (LRB): RIGHT UNICOMPARTMENTAL ARTHROPLASTY TO TOTAL KNEE ARTHROPLASTY (Right) Patient reports pain as moderate.  In CPM. No complaints.   Objective: Vital signs in last 24 hours: Temp:  [98.8 F (37.1 C)-100.2 F (37.9 C)] 99.9 F (37.7 C) (09/10 0826) Pulse Rate:  [99-103] 100 (09/10 0826) Resp:  [14-18] 16 (09/10 0826) BP: (123-153)/(40-55) 123/50 (09/10 0826) SpO2:  [90 %-94 %] 94 % (09/10 0826)  Intake/Output from previous day: 09/09 0701 - 09/10 0700 In: 840 [P.O.:840] Out: -  Intake/Output this shift: No intake/output data recorded.  Recent Labs    06/16/19 0337  HGB 12.0   Recent Labs    06/16/19 0337  WBC 8.7  RBC 4.00  HCT 36.4  PLT 156   Recent Labs    06/16/19 0337  NA 134*  K 4.0  CL 100  CO2 28  BUN 11  CREATININE 0.66  GLUCOSE 143*  CALCIUM 8.4*   No results for input(s): LABPT, INR in the last 72 hours.  Right lower extremity: Sensation intact distally Intact pulses distally Incision: scant drainage Compartment soft   Assessment/Plan: 2 Days Post-Op Procedure(s) (LRB): RIGHT UNICOMPARTMENTAL ARTHROPLASTY TO TOTAL KNEE ARTHROPLASTY (Right) Up with therapy will discharge to home later today if patient does well with PT and remains stable.       [name] 06/17/2019, 9:46 AM

## 2019-06-18 ENCOUNTER — Encounter (HOSPITAL_COMMUNITY): Payer: Self-pay | Admitting: Orthopaedic Surgery

## 2019-06-19 DIAGNOSIS — Z96651 Presence of right artificial knee joint: Secondary | ICD-10-CM | POA: Diagnosis not present

## 2019-06-19 DIAGNOSIS — Z85819 Personal history of malignant neoplasm of unspecified site of lip, oral cavity, and pharynx: Secondary | ICD-10-CM | POA: Diagnosis not present

## 2019-06-19 DIAGNOSIS — Z9181 History of falling: Secondary | ICD-10-CM | POA: Diagnosis not present

## 2019-06-19 DIAGNOSIS — T8484XD Pain due to internal orthopedic prosthetic devices, implants and grafts, subsequent encounter: Secondary | ICD-10-CM | POA: Diagnosis not present

## 2019-06-19 DIAGNOSIS — Z87891 Personal history of nicotine dependence: Secondary | ICD-10-CM | POA: Diagnosis not present

## 2019-06-19 DIAGNOSIS — E669 Obesity, unspecified: Secondary | ICD-10-CM | POA: Diagnosis not present

## 2019-06-19 DIAGNOSIS — Z86010 Personal history of colonic polyps: Secondary | ICD-10-CM | POA: Diagnosis not present

## 2019-06-19 DIAGNOSIS — K219 Gastro-esophageal reflux disease without esophagitis: Secondary | ICD-10-CM | POA: Diagnosis not present

## 2019-06-19 DIAGNOSIS — M1712 Unilateral primary osteoarthritis, left knee: Secondary | ICD-10-CM | POA: Diagnosis not present

## 2019-06-19 DIAGNOSIS — J309 Allergic rhinitis, unspecified: Secondary | ICD-10-CM | POA: Diagnosis not present

## 2019-06-19 DIAGNOSIS — Z6832 Body mass index (BMI) 32.0-32.9, adult: Secondary | ICD-10-CM | POA: Diagnosis not present

## 2019-06-19 DIAGNOSIS — G5603 Carpal tunnel syndrome, bilateral upper limbs: Secondary | ICD-10-CM | POA: Diagnosis not present

## 2019-06-21 ENCOUNTER — Telehealth: Payer: Self-pay | Admitting: Orthopaedic Surgery

## 2019-06-21 NOTE — Telephone Encounter (Signed)
[place] Bells, PT, from [facility] called to request continuation orders for PT for the following:  3x a week for 1 week 2x a week for 1 week  CB#[phone].  Thank you.

## 2019-06-21 NOTE — Telephone Encounter (Signed)
Verbal order given  

## 2019-06-24 DIAGNOSIS — E669 Obesity, unspecified: Secondary | ICD-10-CM | POA: Diagnosis not present

## 2019-06-24 DIAGNOSIS — G5603 Carpal tunnel syndrome, bilateral upper limbs: Secondary | ICD-10-CM | POA: Diagnosis not present

## 2019-06-24 DIAGNOSIS — Z85819 Personal history of malignant neoplasm of unspecified site of lip, oral cavity, and pharynx: Secondary | ICD-10-CM | POA: Diagnosis not present

## 2019-06-24 DIAGNOSIS — J309 Allergic rhinitis, unspecified: Secondary | ICD-10-CM | POA: Diagnosis not present

## 2019-06-24 DIAGNOSIS — Z86010 Personal history of colonic polyps: Secondary | ICD-10-CM | POA: Diagnosis not present

## 2019-06-24 DIAGNOSIS — K219 Gastro-esophageal reflux disease without esophagitis: Secondary | ICD-10-CM | POA: Diagnosis not present

## 2019-06-24 DIAGNOSIS — Z6832 Body mass index (BMI) 32.0-32.9, adult: Secondary | ICD-10-CM | POA: Diagnosis not present

## 2019-06-24 DIAGNOSIS — Z87891 Personal history of nicotine dependence: Secondary | ICD-10-CM | POA: Diagnosis not present

## 2019-06-24 DIAGNOSIS — M1712 Unilateral primary osteoarthritis, left knee: Secondary | ICD-10-CM | POA: Diagnosis not present

## 2019-06-24 DIAGNOSIS — T8484XD Pain due to internal orthopedic prosthetic devices, implants and grafts, subsequent encounter: Secondary | ICD-10-CM | POA: Diagnosis not present

## 2019-06-24 DIAGNOSIS — Z96651 Presence of right artificial knee joint: Secondary | ICD-10-CM | POA: Diagnosis not present

## 2019-06-24 DIAGNOSIS — Z9181 History of falling: Secondary | ICD-10-CM | POA: Diagnosis not present

## 2019-06-29 ENCOUNTER — Encounter: Payer: Self-pay | Admitting: Orthopaedic Surgery

## 2019-06-29 ENCOUNTER — Ambulatory Visit (INDEPENDENT_AMBULATORY_CARE_PROVIDER_SITE_OTHER): Payer: PPO | Admitting: Orthopaedic Surgery

## 2019-06-29 DIAGNOSIS — N3941 Urge incontinence: Secondary | ICD-10-CM | POA: Diagnosis not present

## 2019-06-29 DIAGNOSIS — Z96651 Presence of right artificial knee joint: Secondary | ICD-10-CM

## 2019-06-29 NOTE — Progress Notes (Signed)
HPI: [name] returns today for 2-week status post conversion of right knee uni-arthroplasty to right total knee arthroplasty.  She is overall doing well.  She is ambulating with a cane.  She is had no fevers chills shortness of breath.  She is been on aspirin twice daily.  She has been taking Robaxin she is now taking Tylenol for pain trying to wean off oxycodone.  Physical exam: General well-developed female no acute distress mood and affect appropriate.  Right knee surgical incisions healing well no signs of dehiscence or infection.  Well approximated with subcu stitch.  Right calf supple minimal discomfort with palpation.  Full extension of the right knee flexion 90 degrees.  Right ankle she has tenderness over the anterior lateral aspect of the ankle near the sinus Tarsi region.  No tenderness over the medial lateral malleolus.  Full dorsiflexion plantarflexion ankle.  Minimal edema over the lateral aspect of the right ankle.  Impression: Status post right uni-knee arthroplasty conversion to right total knee arthroplasty 06/15/2019  Right ankle sprain  Plan: Patient mentioned that that she injured her ankle at the hospital and her foot got caught on the floor and she turned it.  She is having no mechanical symptoms ankle.  She notes swelling.  She feels like the ankle is slowly improving just wanted it looked at.  In regards to her knee she is able to get incision wet no submerging of the incision.  She is given prescription for outpatient physical therapy to begin soon as possible and work on range of motion strengthening.  Should go back on her 81 mg aspirin daily.  Follow-up with [place] in 1 month.  Questions encouraged and answered.

## 2019-07-05 DIAGNOSIS — R2681 Unsteadiness on feet: Secondary | ICD-10-CM | POA: Diagnosis not present

## 2019-07-05 DIAGNOSIS — M25561 Pain in right knee: Secondary | ICD-10-CM | POA: Diagnosis not present

## 2019-07-12 DIAGNOSIS — R2681 Unsteadiness on feet: Secondary | ICD-10-CM | POA: Diagnosis not present

## 2019-07-12 DIAGNOSIS — M25561 Pain in right knee: Secondary | ICD-10-CM | POA: Diagnosis not present

## 2019-07-15 DIAGNOSIS — M25561 Pain in right knee: Secondary | ICD-10-CM | POA: Diagnosis not present

## 2019-07-15 DIAGNOSIS — R2681 Unsteadiness on feet: Secondary | ICD-10-CM | POA: Diagnosis not present

## 2019-07-19 ENCOUNTER — Encounter: Payer: PPO | Admitting: Internal Medicine

## 2019-07-19 DIAGNOSIS — R2681 Unsteadiness on feet: Secondary | ICD-10-CM | POA: Diagnosis not present

## 2019-07-19 DIAGNOSIS — M25561 Pain in right knee: Secondary | ICD-10-CM | POA: Diagnosis not present

## 2019-07-22 DIAGNOSIS — R2681 Unsteadiness on feet: Secondary | ICD-10-CM | POA: Diagnosis not present

## 2019-07-22 DIAGNOSIS — M25561 Pain in right knee: Secondary | ICD-10-CM | POA: Diagnosis not present

## 2019-07-26 DIAGNOSIS — M25561 Pain in right knee: Secondary | ICD-10-CM | POA: Diagnosis not present

## 2019-07-26 DIAGNOSIS — R2681 Unsteadiness on feet: Secondary | ICD-10-CM | POA: Diagnosis not present

## 2019-07-27 ENCOUNTER — Other Ambulatory Visit: Payer: Self-pay

## 2019-07-27 ENCOUNTER — Ambulatory Visit (INDEPENDENT_AMBULATORY_CARE_PROVIDER_SITE_OTHER): Payer: PPO | Admitting: Orthopaedic Surgery

## 2019-07-27 ENCOUNTER — Encounter: Payer: Self-pay | Admitting: Orthopaedic Surgery

## 2019-07-27 DIAGNOSIS — N3941 Urge incontinence: Secondary | ICD-10-CM | POA: Diagnosis not present

## 2019-07-27 DIAGNOSIS — Z96651 Presence of right artificial knee joint: Secondary | ICD-10-CM

## 2019-07-27 MED ORDER — HYDROCODONE-ACETAMINOPHEN 5-325 MG PO TABS: 1.0000 | ORAL_TABLET | Freq: Four times a day (QID) | ORAL | 0 refills | Status: DC | PRN

## 2019-07-27 NOTE — Progress Notes (Signed)
The patient is now 6-weeks status post a right knee revision of a unicompartmental knee replacement total knee replacement.  She says her range of motion and strength are increasing.  She is doing well overall and has no issues.  She is ambulating with a cane.  She is 78 years old.  On exam her range of motion is excellent.  It is almost full.  Her right knee is ligamentously stable.  There is only slight swelling.  She will continue to increase her mobility through therapy.  I did see her therapy notes and she is making excellent progress.  They are to work with her twice a week for 2 more weeks.  When we do see her back in 4 weeks no x-rays are needed.  All question concerns were answered and addressed.  I will refill her hydrocodone which she has been using sparingly.

## 2019-07-29 ENCOUNTER — Ambulatory Visit (INDEPENDENT_AMBULATORY_CARE_PROVIDER_SITE_OTHER): Payer: PPO | Admitting: Internal Medicine

## 2019-07-29 ENCOUNTER — Encounter: Payer: Self-pay | Admitting: Internal Medicine

## 2019-07-29 ENCOUNTER — Other Ambulatory Visit: Payer: Self-pay

## 2019-07-29 VITALS — BP 136/78 | HR 82 | Temp 98.0°F | Ht 62.0 in | Wt 176.0 lb

## 2019-07-29 DIAGNOSIS — R2681 Unsteadiness on feet: Secondary | ICD-10-CM | POA: Diagnosis not present

## 2019-07-29 DIAGNOSIS — Z96659 Presence of unspecified artificial knee joint: Secondary | ICD-10-CM

## 2019-07-29 DIAGNOSIS — E559 Vitamin D deficiency, unspecified: Secondary | ICD-10-CM

## 2019-07-29 DIAGNOSIS — K219 Gastro-esophageal reflux disease without esophagitis: Secondary | ICD-10-CM | POA: Diagnosis not present

## 2019-07-29 DIAGNOSIS — Z23 Encounter for immunization: Secondary | ICD-10-CM | POA: Diagnosis not present

## 2019-07-29 DIAGNOSIS — Z0000 Encounter for general adult medical examination without abnormal findings: Secondary | ICD-10-CM

## 2019-07-29 DIAGNOSIS — M25561 Pain in right knee: Secondary | ICD-10-CM | POA: Diagnosis not present

## 2019-07-29 DIAGNOSIS — T8484XD Pain due to internal orthopedic prosthetic devices, implants and grafts, subsequent encounter: Secondary | ICD-10-CM

## 2019-07-29 LAB — LIPID PANEL
Cholesterol: 192 mg/dL (ref 0–200)
HDL: 51.9 mg/dL (ref >39.00)
LDL Cholesterol: 117 mg/dL — ABNORMAL HIGH (ref 0–99)
NonHDL: 140.56
Total CHOL/HDL Ratio: 4
Triglycerides: 120 mg/dL (ref 0.0–149.0)
VLDL: 24 mg/dL (ref 0.0–40.0)

## 2019-07-29 LAB — COMPREHENSIVE METABOLIC PANEL
ALT: 13 U/L (ref 0–35)
AST: 21 U/L (ref 0–37)
Albumin: 4.2 g/dL (ref 3.5–5.2)
Alkaline Phosphatase: 75 U/L (ref 39–117)
BUN: 16 mg/dL (ref 6–23)
CO2: 31 mEq/L (ref 19–32)
Calcium: 9.6 mg/dL (ref 8.4–10.5)
Chloride: 102 mEq/L (ref 96–112)
Creatinine, Ser: 0.69 mg/dL (ref 0.40–1.20)
GFR: 82.29 mL/min (ref >60.00)
Glucose, Bld: 113 mg/dL — ABNORMAL HIGH (ref 70–99)
Potassium: 4.1 mEq/L (ref 3.5–5.1)
Sodium: 140 mEq/L (ref 135–145)
Total Bilirubin: 0.5 mg/dL (ref 0.2–1.2)
Total Protein: 7.2 g/dL (ref 6.0–8.3)

## 2019-07-29 LAB — CBC
HCT: 41.3 % (ref 36.0–46.0)
Hemoglobin: 13.6 g/dL (ref 12.0–15.0)
MCHC: 32.8 g/dL (ref 30.0–36.0)
MCV: 90.5 fl (ref 78.0–100.0)
Platelets: 221 10*3/uL (ref 150.0–400.0)
RBC: 4.57 Mil/uL (ref 3.87–5.11)
RDW: 13.7 % (ref 11.5–15.5)
WBC: 6.8 10*3/uL (ref 4.0–10.5)

## 2019-07-29 LAB — VITAMIN D 25 HYDROXY (VIT D DEFICIENCY, FRACTURES): VITD: 56.58 ng/mL (ref 30.00–100.00)

## 2019-07-29 NOTE — Progress Notes (Signed)
HPI:  Patient presents to the clinic today for her subsequent Annual Medicare Wellness exam.  OA: Mainly in her fingers and knees.  She is taking Cymbalta, Glucosamine/Chondroitin and G abapentin as prescribed.  She follows with [name].  GERD: She denies breakthrough on Esomeprazole.  Upper GI from 05/2018 reviewed.  Past Medical History:  Diagnosis Date  . Arthritis   . Cancer ([place])   . History of chicken pox   . History of colon polyps   . History of mouth cancer   . Osteoarthritis of knee    Medial compartment, Right    Current Outpatient Medications  Medication Sig Dispense Refill  . aspirin EC 81 MG tablet Take 81 mg by mouth daily.    . Calcium Carb-Cholecalciferol (CALCIUM 600/VITAMIN D3) 600-800 MG-UNIT TABS Take 1 tablet by mouth 2 (two) times daily.     . cetirizine (ZYRTEC) 10 MG tablet Take 10 mg by mouth daily.    . chlorhexidine (PERIDEX) 0.12 % solution Use as directed 15 mLs in the mouth or throat 2 (two) times daily.     . DULoxetine (CYMBALTA) 20 MG capsule Take 20 mg by mouth daily.   11  . esomeprazole (NEXIUM) 20 MG capsule Take 20 mg by mouth daily before breakfast.     . fluticasone (FLONASE) 50 MCG/ACT nasal spray Place 2 sprays into the nose at bedtime.     . gabapentin (NEURONTIN) 300 MG capsule Take 1 capsule (300 mg total) by mouth at bedtime. 30 capsule 1  . Glucosamine-Chondroitin (COSAMIN DS PO) Take 1 tablet by mouth 2 (two) times daily.    Marland Kitchen HYDROcodone-acetaminophen (NORCO/VICODIN) 5-325 MG tablet Take 1 tablet by mouth every 6 (six) hours as needed for moderate pain. 30 tablet 0  . Multiple Vitamins-Minerals (MULTIVITAMIN WITH MINERALS) tablet Take 1 tablet by mouth every evening. Women's One-A-Day    . Omega-3 Fatty Acids (FISH OIL) 1000 MG CAPS Take 1,000 mg by mouth daily.    . TURMERIC PO Take 538 mg by mouth daily.     No current facility-administered medications for this visit.     Allergies  Allergen Reactions  . Ibuprofen Nausea  And Vomiting and Other (See Comments)    GI upset, GERD    No family history on file.  Social History   Socioeconomic History  . Marital status: Married    Spouse name: Not on file  . Number of children: Not on file  . Years of education: Not on file  . Highest education level: Not on file  Occupational History  . Not on file  Social Needs  . Financial resource strain: Not on file  . Food insecurity    Worry: Not on file    Inability: Not on file  . Transportation needs    Medical: Not on file    Non-medical: Not on file  Tobacco Use  . Smoking status: Former Smoker    Quit date: 12/28/1986    Years since quitting: 32.6  . Smokeless tobacco: Never Used  Substance and Sexual Activity  . Alcohol use: No  . Drug use: No  . Sexual activity: Not Currently  Lifestyle  . Physical activity    Days per week: Not on file    Minutes per session: Not on file  . Stress: Not on file  Relationships  . Social Herbalist on phone: Not on file    Gets together: Not on file    Attends religious service:  Not on file    Active member of club or organization: Not on file    Attends meetings of clubs or organizations: Not on file    Relationship status: Not on file  . Intimate partner violence    Fear of current or ex partner: Not on file    Emotionally abused: Not on file    Physically abused: Not on file    Forced sexual activity: Not on file  Other Topics Concern  . Not on file  Social History Narrative  . Not on file    Hospitiliaztions: 06/2019, right knee replacement  Health Maintenance:    Flu: 07/2018  Tetanus: 07/2012  Pneumovax: 07/2007  Prevnar: 07/2016  Zostavax: 07/2008   Shingrix: 08/2018, 11/2018  Mammogram: 08/2018, scheduled 08/2019  Pap Smear: No longer screening  Bone Density: 08/2016  Colon Screening: 10/2009  Eye Doctor: Annually  Dental Exam: As needed   Providers:   PCP: Webb Silversmith, NP-C  Orthopedist: Dr. Rush Farmer    I have  personally reviewed and have noted:  1. The patient's medical and social history 2. Their use of alcohol, tobacco or illicit drugs 3. Their current medications and supplements 4. The patient's functional ability including ADL's, fall risks, home safety risks and hearing or visual impairment. 5. Diet and physical activities 6. Evidence for depression or mood disorder  Subjective:   Review of Systems:   Constitutional: Denies fever, malaise, fatigue, headache or abrupt weight changes.  HEENT: Denies eye pain, eye redness, ear pain, ringing in the ears, wax buildup, runny nose, nasal congestion, bloody nose, or sore throat. Respiratory: Denies difficulty breathing, shortness of breath, cough or sputum production.   Cardiovascular: Denies chest pain, chest tightness, palpitations or swelling in the hands or feet.  Gastrointestinal: Patient reports intermittent reflux.  Denies abdominal pain, bloating, constipation, diarrhea or blood in the stool.  GU: Denies urgency, frequency, pain with urination, burning sensation, blood in urine, odor or discharge. Musculoskeletal: Patient reports intermittent joint pain.  Denies decrease in range of motion, difficulty with gait, muscle pain or joint welling.  Skin: Denies redness, rashes, lesions or ulcercations.  Neurological: Denies dizziness, difficulty with memory, difficulty with speech or problems with balance and coordination.  Psych: Denies anxiety, depression, SI/HI.  No other specific complaints in a complete review of systems (except as listed in HPI above).  Objective:  PE:   BP 136/78   Pulse 82   Temp 98 F (36.7 C) (Temporal)   Ht 5\' 2"  (1.575 m)   Wt 176 lb (79.8 kg)   LMP  (LMP Unknown)   SpO2 97%   BMI 32.19 kg/m  Wt Readings from Last 3 Encounters:  07/29/19 176 lb (79.8 kg)  06/15/19 174 lb 8 oz (79.2 kg)  06/11/19 174 lb 8 oz (79.2 kg)    General: Appears her stated age, obese, in NAD. Skin: Warm, dry and intact. No  rashesnoted. HEENT: Head: normal shape and size; Eyes: sclera white, no icterus, conjunctiva pink, PERRLA and EOMs intact; Ears: Tm's gray and intact, normal light reflex;  Neck: Neck supple, trachea midline. No masses, lumps or thyromegaly present.  Cardiovascular: Normal rate and rhythm. S1,S2 noted.  No murmur, rubs or gallops noted. No JVD or BLE edema. No carotid bruits noted. Pulmonary/Chest: Normal effort and positive vesicular breath sounds. No respiratory distress. No wheezes, rales or ronchi noted.  Abdomen: Soft and nontender. Normal bowel sounds. No distention or masses noted. Liver, spleen and kidneys non palpable. Musculoskeletal: Strength  5/5 BUE/BLE. No signs of joint swelling.  Neurological: Alert and oriented. Cranial nerves II-XII grossly intact. Coordination normal.  Psychiatric: Mood and affect normal. Behavior is normal. Judgment and thought content normal.     BMET    Component Value Date/Time   NA 134 (L) 06/16/2019 0337   K 4.0 06/16/2019 0337   CL 100 06/16/2019 0337   CO2 28 06/16/2019 0337   GLUCOSE 143 (H) 06/16/2019 0337   BUN 11 06/16/2019 0337   CREATININE 0.66 06/16/2019 0337   CREATININE 0.59 (L) 06/24/2016 0853   CALCIUM 8.4 (L) 06/16/2019 0337   GFRNONAA >60 06/16/2019 0337   GFRNONAA >89 06/24/2016 0853   GFRAA >60 06/16/2019 0337   GFRAA >89 06/24/2016 0853    Lipid Panel     Component Value Date/Time   CHOL 170 07/16/2018 0841   TRIG 153.0 (H) 07/16/2018 0841   HDL 49.40 07/16/2018 0841   CHOLHDL 3 07/16/2018 0841   VLDL 30.6 07/16/2018 0841   LDLCALC 90 07/16/2018 0841    CBC    Component Value Date/Time   WBC 8.7 06/16/2019 0337   RBC 4.00 06/16/2019 0337   HGB 12.0 06/16/2019 0337   HCT 36.4 06/16/2019 0337   PLT 156 06/16/2019 0337   MCV 91.0 06/16/2019 0337   MCV 86.6 06/24/2016 0909   MCH 30.0 06/16/2019 0337   MCHC 33.0 06/16/2019 0337   RDW 12.9 06/16/2019 0337    Hgb A1C No results found for:  HGBA1C    Assessment and Plan:   Medicare Annual Wellness Visit:  Diet: She does eat some meat. She consumes fruits and veggies daily. She tries to avoid fried foods. She drinks mostly water, coffee. Physical activity: Walkinig Depression/mood screen: Negative, PHQ 9 score of 0 Hearing: Intact to whispered voice Visual acuity: Grossly normal, performs annual eye exam  ADLs: Capable Fall risk: None Home safety: Good Cognitive evaluation: Intact to orientation, naming, recall and repetition EOL planning: Adv directives, full code/ I agree  Preventative Medicine: Flu shot today. Tetanus, pneumovax, prevnar, zostovax, shingrix UTD. Mammogram scheduled. She no longer wants cervical cancer or colon cancer screening. Bone density due 2022. Encouraged her to consume a balanced diet and exercise regimen. Advised her to see an eye doctor and dentist annually. Will check CBC, CMET, Lipid and Vit D today. Due dates for screening exams given to patient as part of her AVS.   Next appointment: 1 year, Medicare Wellness Exam   Webb Silversmith, NP

## 2019-07-29 NOTE — Patient Instructions (Signed)

## 2019-08-02 DIAGNOSIS — R2681 Unsteadiness on feet: Secondary | ICD-10-CM | POA: Diagnosis not present

## 2019-08-02 DIAGNOSIS — M25561 Pain in right knee: Secondary | ICD-10-CM | POA: Diagnosis not present

## 2019-08-05 DIAGNOSIS — M25561 Pain in right knee: Secondary | ICD-10-CM | POA: Diagnosis not present

## 2019-08-05 DIAGNOSIS — R2681 Unsteadiness on feet: Secondary | ICD-10-CM | POA: Diagnosis not present

## 2019-08-06 ENCOUNTER — Encounter: Payer: Self-pay | Admitting: Internal Medicine

## 2019-08-06 NOTE — Assessment & Plan Note (Signed)
Continue Cymbalta, Glucosamine/Chondroitin and Gabapentin Continue to follow with Dr. Rush Farmer

## 2019-08-06 NOTE — Assessment & Plan Note (Signed)
Stable on Esomeprazole Discussed how weight loss could help reduce reflux symptoms CBC and CMET Today

## 2019-08-09 DIAGNOSIS — R2681 Unsteadiness on feet: Secondary | ICD-10-CM | POA: Diagnosis not present

## 2019-08-09 DIAGNOSIS — M25561 Pain in right knee: Secondary | ICD-10-CM | POA: Diagnosis not present

## 2019-08-09 DIAGNOSIS — H2513 Age-related nuclear cataract, bilateral: Secondary | ICD-10-CM | POA: Diagnosis not present

## 2019-08-12 DIAGNOSIS — R2681 Unsteadiness on feet: Secondary | ICD-10-CM | POA: Diagnosis not present

## 2019-08-12 DIAGNOSIS — M25561 Pain in right knee: Secondary | ICD-10-CM | POA: Diagnosis not present

## 2019-08-18 DIAGNOSIS — M25561 Pain in right knee: Secondary | ICD-10-CM | POA: Diagnosis not present

## 2019-08-18 DIAGNOSIS — R2681 Unsteadiness on feet: Secondary | ICD-10-CM | POA: Diagnosis not present

## 2019-08-23 ENCOUNTER — Other Ambulatory Visit: Payer: Self-pay

## 2019-08-23 ENCOUNTER — Ambulatory Visit
Admission: RE | Admit: 2019-08-23 | Discharge: 2019-08-23 | Disposition: A | Discharge status: Home / Self Care | Payer: PPO | Source: Ambulatory Visit | Attending: Obstetrics and Gynecology | Admitting: Obstetrics and Gynecology

## 2019-08-23 DIAGNOSIS — R928 Other abnormal and inconclusive findings on diagnostic imaging of breast: Secondary | ICD-10-CM

## 2019-08-23 DIAGNOSIS — Z1231 Encounter for screening mammogram for malignant neoplasm of breast: Secondary | ICD-10-CM | POA: Diagnosis not present

## 2019-08-24 ENCOUNTER — Encounter: Payer: Self-pay | Admitting: Orthopaedic Surgery

## 2019-08-24 ENCOUNTER — Ambulatory Visit (INDEPENDENT_AMBULATORY_CARE_PROVIDER_SITE_OTHER): Payer: PPO | Admitting: Orthopaedic Surgery

## 2019-08-24 DIAGNOSIS — N3941 Urge incontinence: Secondary | ICD-10-CM | POA: Diagnosis not present

## 2019-08-24 DIAGNOSIS — Z96651 Presence of right artificial knee joint: Secondary | ICD-10-CM

## 2019-08-24 NOTE — Progress Notes (Signed)
Office Visit Note   Patient: [name]           Date of Birth: 1941-03-10           MRN: [id number] Visit Date: 08/24/2019              Requested by: Jearld Fenton, NP [address] [place],  [place] [address] PCP: Jearld Fenton, NP   Assessment & Plan: Visit Diagnoses:  1. Status post total right knee replacement     Plan: She will finish up with physical therapy and then advanced to just a home exercise program.  I encouraged her to continue to work on strengthening of the knee.  If she has any questions or concerns she will follow-up with [place] sooner otherwise follow-up with [place] in 4 months that time obtain AP and lateral views of the right knee.  Questions encouraged and answered at length.  Follow-Up Instructions: Return in about 4 months (around 12/22/2019).   Orders:  No orders of the defined types were placed in this encounter.  No orders of the defined types were placed in this encounter.     Procedures: No procedures performed   Clinical Data: No additional findings.   Subjective: Chief Complaint  Patient presents with  . Right Knee - Follow-up    HPI [name] comes in today 70 days status post right unicompartmental arthroplasty conversion to right total knee arthroplasty.  She overall is doing well.  She is working on formal physical therapy has 3 more visits scheduled.  She has some pain discomfort whenever walking outside in the yard for long periods of time but otherwise pain in the knee is much improved.  She has no concerns. Review of Systems Negative for fevers or chills.  Objective: Vital Signs: LMP  (LMP Unknown)   Physical Exam Constitutional:      Appearance: She is not ill-appearing or diaphoretic.  Pulmonary:     Effort: Pulmonary effort is normal.  Neurological:     Mental Status: She is alert and oriented to person, place, and time.  Psychiatric:        Mood and Affect: Mood normal.     Ortho Exam Right knee full  extension flexion to 110 degrees no instability valgus varus stressing.  Surgical incisions well-healed.  Calf supple nontender.  Ambulating without any assistive device. Specialty Comments:  No specialty comments available.  Imaging: Mm 3d Screen Breast Bilateral  Result Date: 08/23/2019 CLINICAL DATA:  Screening. EXAM: DIGITAL SCREENING BILATERAL MAMMOGRAM WITH TOMO AND CAD COMPARISON:  Previous exam(s). ACR Breast Density Category b: There are scattered areas of fibroglandular density. FINDINGS: There are no findings suspicious for malignancy. Images were processed with CAD. IMPRESSION: No mammographic evidence of malignancy. A result letter of this screening mammogram will be mailed directly to the patient. RECOMMENDATION: Screening mammogram in one year. (Code:SM-B-01Y) BI-RADS CATEGORY  1: Negative. Electronically Signed   By: Curlene Dolphin M.D.   On: 08/23/2019 16:57     PMFS History: Patient Active Problem List   Diagnosis Date Noted  . Pain due to unicompartmental arthroplasty of knee ([place]) 06/15/2019  . Acid reflux 04/06/2018  . 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 Arthritis   . Cancer ([place])   . History of chicken pox   . History of colon polyps   . History of mouth cancer   .  Osteoarthritis of knee    Medial compartment, Right    History reviewed. No pertinent family history.  Past Surgical History:  Procedure Laterality Date  . APPENDECTOMY    . DILATION AND CURETTAGE OF UTERUS    . 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 KNEE ARTHROSCOPY W/ MENISCAL REPAIR Left   . PARTIAL KNEE ARTHROPLASTY Right 09/16/2017  . PARTIAL KNEE ARTHROPLASTY Right 09/16/2017   Procedure: RIGHT UNICOMPARTMENTAL KNEE;  Surgeon: Mcarthur Rossetti, MD;  Location: [facility];  Service: Orthopedics;  Laterality: Right;  . SHOULDER ARTHROSCOPY Left 11/2018  . TOTAL KNEE REVISION Right 06/15/2019   Procedure:  RIGHT UNICOMPARTMENTAL ARTHROPLASTY TO TOTAL KNEE ARTHROPLASTY;  Surgeon: Mcarthur Rossetti, MD;  Location: [facility];  Service: Orthopedics;  Laterality: Right;  . TUBAL LIGATION     Social History   Occupational History  . Not on file  Tobacco Use  . Smoking status: Former Smoker    Quit date: 12/28/1986    Years since quitting: 32.6  . Smokeless tobacco: Never Used  Substance and Sexual Activity  . Alcohol use: No  . Drug use: No  . Sexual activity: Not Currently

## 2019-08-25 DIAGNOSIS — R2681 Unsteadiness on feet: Secondary | ICD-10-CM | POA: Diagnosis not present

## 2019-08-25 DIAGNOSIS — M25561 Pain in right knee: Secondary | ICD-10-CM | POA: Diagnosis not present

## 2019-09-01 DIAGNOSIS — R2681 Unsteadiness on feet: Secondary | ICD-10-CM | POA: Diagnosis not present

## 2019-09-01 DIAGNOSIS — M25561 Pain in right knee: Secondary | ICD-10-CM | POA: Diagnosis not present

## 2019-09-08 DIAGNOSIS — R2681 Unsteadiness on feet: Secondary | ICD-10-CM | POA: Diagnosis not present

## 2019-09-08 DIAGNOSIS — M25561 Pain in right knee: Secondary | ICD-10-CM | POA: Diagnosis not present

## 2019-09-21 DIAGNOSIS — N3941 Urge incontinence: Secondary | ICD-10-CM | POA: Diagnosis not present

## 2019-10-19 DIAGNOSIS — N3941 Urge incontinence: Secondary | ICD-10-CM | POA: Diagnosis not present

## 2019-11-16 DIAGNOSIS — N3941 Urge incontinence: Secondary | ICD-10-CM | POA: Diagnosis not present

## 2019-12-14 DIAGNOSIS — N3941 Urge incontinence: Secondary | ICD-10-CM | POA: Diagnosis not present

## 2019-12-22 ENCOUNTER — Encounter: Payer: Self-pay | Admitting: Orthopaedic Surgery

## 2019-12-22 ENCOUNTER — Ambulatory Visit (INDEPENDENT_AMBULATORY_CARE_PROVIDER_SITE_OTHER): Payer: PPO | Admitting: Orthopaedic Surgery

## 2019-12-22 ENCOUNTER — Other Ambulatory Visit: Payer: Self-pay

## 2019-12-22 ENCOUNTER — Ambulatory Visit (INDEPENDENT_AMBULATORY_CARE_PROVIDER_SITE_OTHER): Payer: PPO

## 2019-12-22 DIAGNOSIS — Z96651 Presence of right artificial knee joint: Secondary | ICD-10-CM | POA: Diagnosis not present

## 2019-12-22 NOTE — Progress Notes (Signed)
Office Visit Note   Patient: [name]           Date of Birth: 30-Dec-1940           MRN: [id number] Visit Date: 12/22/2019              Requested by: Jearld Fenton, NP [address] [place],  [place] [address] PCP: Jearld Fenton, NP   Assessment & Plan: Visit Diagnoses:  1. Status post total right knee replacement     Plan: She seems to be doing well overall so she will continue to work on quad strengthening exercises and strengthening of her knee. We will see her back for final visit in 6 months from now. We will have a final AP and lateral of her right knee at that visit. If there are any issues before then she knows to come see [place].  Follow-Up Instructions: Return in about 6 months (around 06/23/2020).   Orders:  Orders Placed This Encounter  Procedures  . XR Knee 1-2 Views Right   No orders of the defined types were placed in this encounter.     Procedures: No procedures performed   Clinical Data: No additional findings.   Subjective: Chief Complaint  Patient presents with  . Right Knee - Follow-up  The patient is 6 months status post revision of a unicompartmental knee replacement on the right side to a total knee arthroplasty. She reports that the knee is doing well. She says range of motion and strength are improving as each week goes by. She has no significant complaints. She is walking without an assistive device.  HPI  Review of Systems She currently denies any headache, chest pain, shortness of breath, fever, chills, nausea, vomiting  Objective: Vital Signs: LMP  (LMP Unknown)   Physical Exam She is alert and orient x3 and in no acute distress Ortho Exam Examination of her right operative knee shows minimal swelling. The incisions healed nicely. The knee feels ligamentously stable. She has full extension and I can flex her to about 100 degrees. Specialty Comments:  No specialty comments available.  Imaging: XR Knee 1-2 Views  Right  Result Date: 12/22/2019 2 views of the right knee show well-seated total knee arthroplasty with no complicating features.    PMFS History: Patient Active Problem List   Diagnosis Date Noted  . 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 Pain due to unicompartmental arthroplasty of knee ([place]) 06/15/2019  . Acid reflux 04/06/2018  . 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 Arthritis   . Cancer ([place])   . History of chicken pox   . History of colon polyps   . History of mouth cancer   . Osteoarthritis of knee    Medial compartment, Right    History reviewed. No pertinent family history.  Past Surgical History:  Procedure Laterality Date  . APPENDECTOMY    . DILATION AND CURETTAGE OF UTERUS    . 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 KNEE ARTHROSCOPY W/ MENISCAL REPAIR Left   . PARTIAL KNEE ARTHROPLASTY Right 09/16/2017  . PARTIAL KNEE ARTHROPLASTY Right 09/16/2017   Procedure: RIGHT UNICOMPARTMENTAL KNEE;  Surgeon: Mcarthur Rossetti, MD;  Location: [facility];  Service: Orthopedics;  Laterality: Right;  . SHOULDER ARTHROSCOPY Left 11/2018  . TOTAL KNEE REVISION Right 06/15/2019   Procedure: RIGHT UNICOMPARTMENTAL ARTHROPLASTY TO TOTAL KNEE  ARTHROPLASTY;  Surgeon: Mcarthur Rossetti, MD;  Location: [facility];  Service: Orthopedics;  Laterality: Right;  . TUBAL LIGATION     Social History   Occupational History  . Not on file  Tobacco Use  . Smoking status: Former Smoker    Quit date: 12/28/1986    Years since quitting: 33.0  . Smokeless tobacco: Never Used  Substance and Sexual Activity  . Alcohol use: No  . Drug use: No  . Sexual activity: Not Currently

## 2020-01-11 DIAGNOSIS — N3941 Urge incontinence: Secondary | ICD-10-CM | POA: Diagnosis not present

## 2020-01-18 DIAGNOSIS — N3946 Mixed incontinence: Secondary | ICD-10-CM | POA: Diagnosis not present

## 2020-02-08 DIAGNOSIS — N3941 Urge incontinence: Secondary | ICD-10-CM | POA: Diagnosis not present

## 2020-03-07 DIAGNOSIS — N3941 Urge incontinence: Secondary | ICD-10-CM | POA: Diagnosis not present

## 2020-04-04 DIAGNOSIS — N3941 Urge incontinence: Secondary | ICD-10-CM | POA: Diagnosis not present

## 2020-04-28 IMAGING — MR MR SHOULDER*L* W/O CM
5 series · 40 of 40 positions shown · non-contrast
Comparison: None.

CLINICAL DATA: Left shoulder pain, limited range of motion.

EXAM:
MRI OF THE LEFT SHOULDER WITHOUT CONTRAST
TECHNIQUE: Multiplanar, multisequence MR imaging of the shoulder was performed.
No intravenous contrast was administered.

[Series 3: PD fat-sat · axial · 4.0mm · 0.55mm/px · z∈[-73,+33]mm · 10 of 20 slices shown (1 of 2)]
[im 1/20]
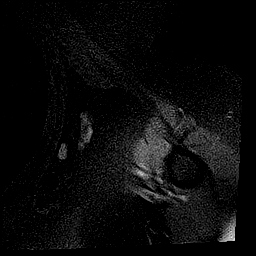
[im 3/20]
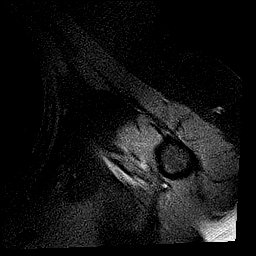
[im 5/20]
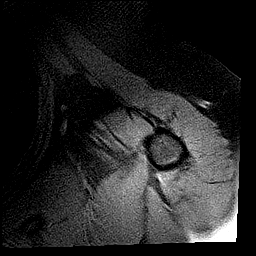
[im 7/20]
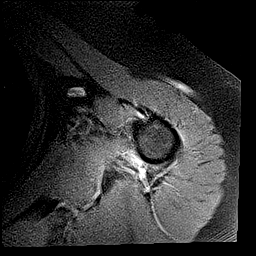
[im 9/20]
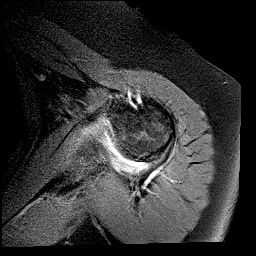
[im 11/20]
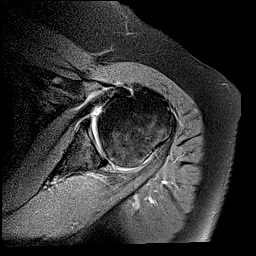
[im 13/20]
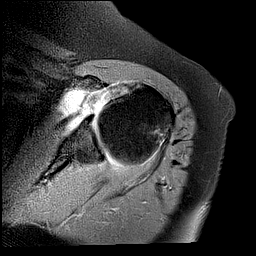
[im 15/20]
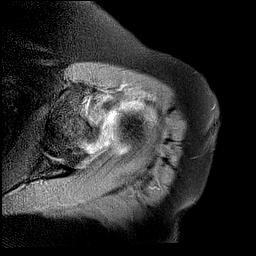
[im 17/20]
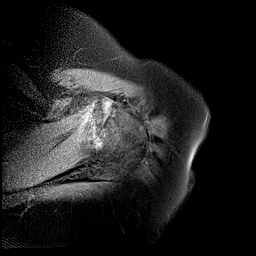
[im 20/20]
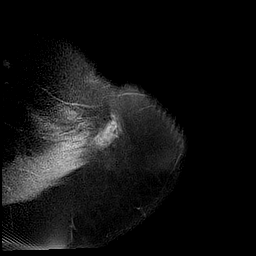

[Series 5: T2 fat-sat · oblique · 4.0mm · 0.55mm/px · 8 of 18 slices shown (1 of 2)]
[im 1/18]
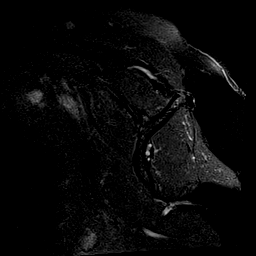
[im 3/18]
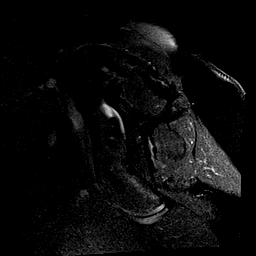
[im 5/18]
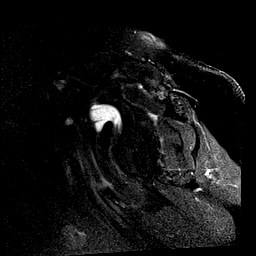
[im 8/18]
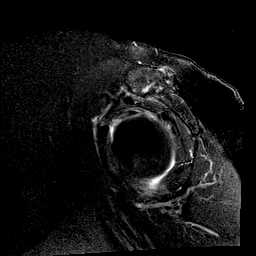
[im 10/18]
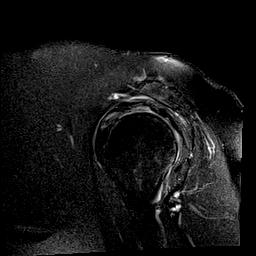
[im 13/18]
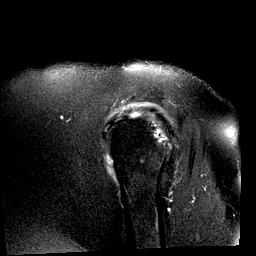
[im 15/18]
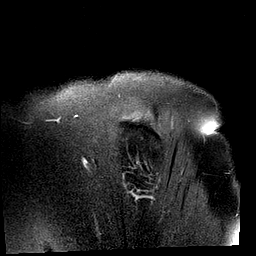
[im 18/18]
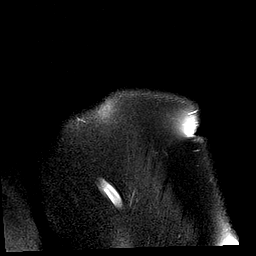

[Series 6: T2 fat-sat · oblique · 4.0mm · 0.55mm/px · 7 of 16 slices shown (2 of 2)]
[im 1/16]
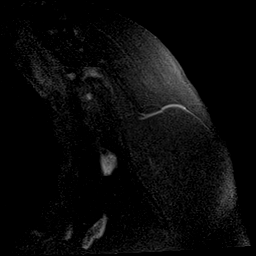
[im 3/16]
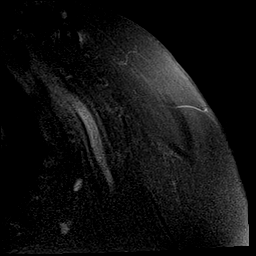
[im 6/16]
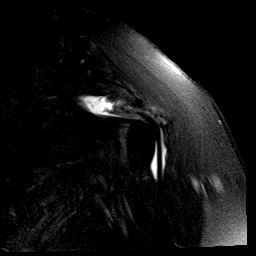
[im 8/16]
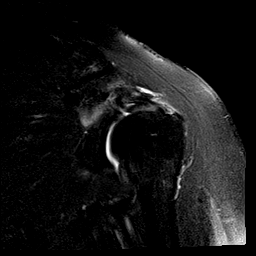
[im 11/16]
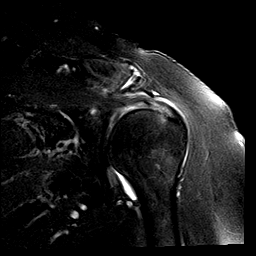
[im 13/16]
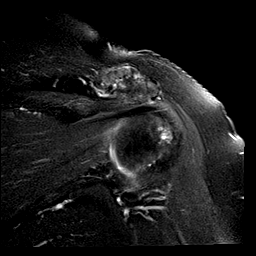
[im 16/16]
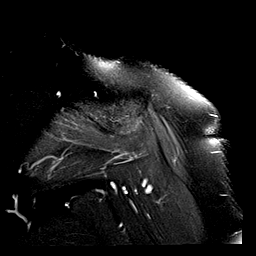

[Series 7: PD fat-sat · oblique · 4.0mm · 0.55mm/px · 7 of 16 slices shown (2 of 2)]
[im 1/16]
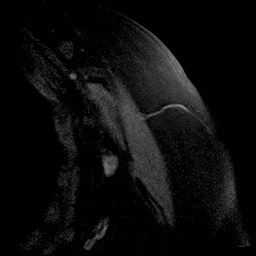
[im 3/16]
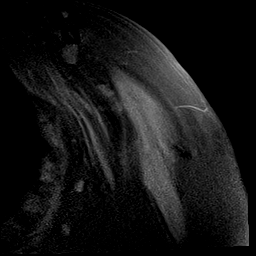
[im 6/16]
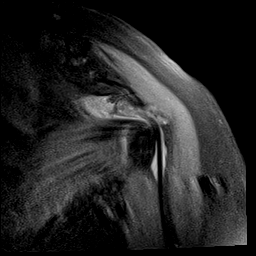
[im 8/16]
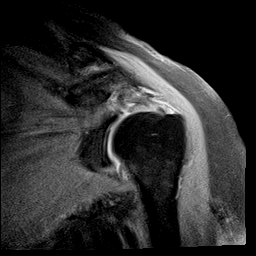
[im 11/16]
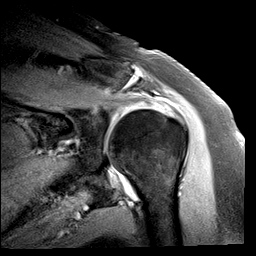
[im 13/16]
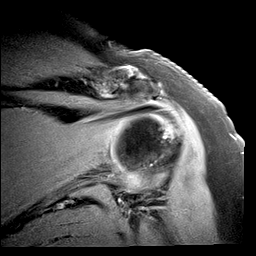
[im 16/16]
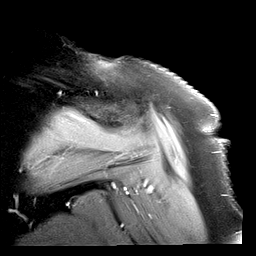

[Series 8: T1 · oblique · 4.0mm · 0.55mm/px · 8 of 18 slices shown]
[im 1/18]
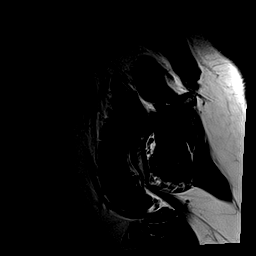
[im 3/18]
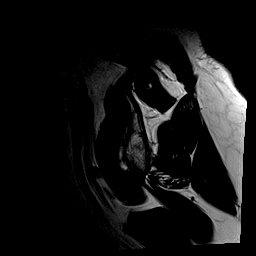
[im 5/18]
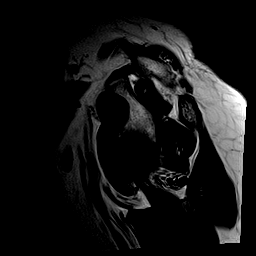
[im 8/18]
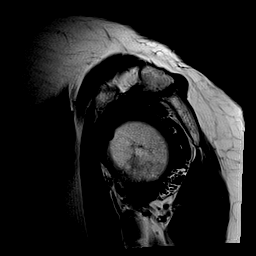
[im 10/18]
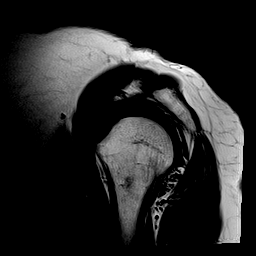
[im 13/18]
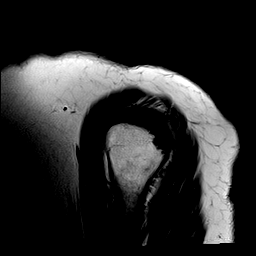
[im 15/18]
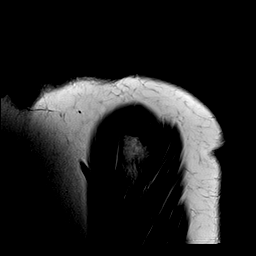
[im 18/18]
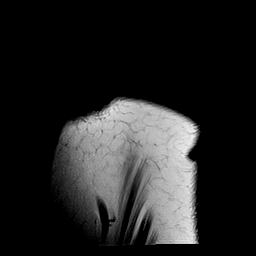

[40 of 40 positions shown; findings below may reference images not displayed]

FINDINGS: Rotator cuff: Severe tendinosis of the supraspinatus tendon with a
partial-thickness articular surface tear and a small partial
thickness bursal surface tear along the anterior-most aspect. Mild
tendinosis of the infraspinatus tendon. Teres minor tendon is
intact. Subscapularis tendon is intact.

Muscles: No atrophy or fatty replacement of nor abnormal signal
within, the muscles of the rotator cuff.

Biceps long head: Moderate tendinosis of the intra-articular portion
of the long head of the biceps tendon.

Acromioclavicular Joint: Moderate arthropathy of the
acromioclavicular joint. Type I acromion. Small amount of
subacromial/subdeltoid bursal fluid.

Glenohumeral Joint: Small joint effusion.  No chondral defect

Labrum:  Superior labral tear.

Bones:  No acute osseous abnormality.  No aggressive osseous lesion.

Other: No fluid collection or hematoma.
IMPRESSION: 1. Severe tendinosis of the supraspinatus tendon with a
partial-thickness articular surface tear and a small partial
thickness bursal surface tear along the anterior-most aspect.
2. Mild tendinosis of the infraspinatus tendon.
3. Moderate tendinosis of the intra-articular portion of the long
head of the biceps tendon.
4. Mild subacromial/subdeltoid bursitis.

## 2020-05-02 ENCOUNTER — Other Ambulatory Visit: Payer: Self-pay

## 2020-05-02 DIAGNOSIS — N3941 Urge incontinence: Secondary | ICD-10-CM | POA: Diagnosis not present

## 2020-05-08 ENCOUNTER — Ambulatory Visit: Payer: PPO | Admitting: Physician Assistant

## 2020-05-08 ENCOUNTER — Ambulatory Visit (INDEPENDENT_AMBULATORY_CARE_PROVIDER_SITE_OTHER): Payer: PPO

## 2020-05-08 ENCOUNTER — Encounter: Payer: Self-pay | Admitting: Physician Assistant

## 2020-05-08 VITALS — Ht 61.42 in | Wt 180.4 lb

## 2020-05-08 DIAGNOSIS — M25562 Pain in left knee: Secondary | ICD-10-CM | POA: Diagnosis not present

## 2020-05-08 MED ORDER — LIDOCAINE HCL 1 % IJ SOLN
3.0000 mL | INTRAMUSCULAR | Status: AC | PRN
Start: 2020-05-08 — End: 2020-05-08
  Administered 2020-05-08: 3 mL

## 2020-05-08 MED ORDER — METHYLPREDNISOLONE ACETATE 40 MG/ML IJ SUSP
40.0000 mg | INTRAMUSCULAR | Status: AC | PRN
  Administered 2020-05-08: 40 mg via INTRA_ARTICULAR

## 2020-05-08 NOTE — Progress Notes (Signed)
Office Visit Note   Patient: [name]           Date of Birth: [birth year]           MRN: [id number] Visit Date: 05/08/2020              Requested by: Jearld Fenton, NP [address] [place],  [place] [address] PCP: Jearld Fenton, NP   Assessment & Plan: Visit Diagnoses:  1. Left knee pain, unspecified chronicity     Plan: We will have her work on quad strengthening exercises.  See her back in 2 weeks see what type of response she had to the injection.  Questions were encouraged and answered at length.  Follow-Up Instructions: Return in about 2 weeks (around 05/22/2020).   Orders:  Orders Placed This Encounter  Procedures  . Large Joint Inj  . XR Knee 1-2 Views Left   No orders of the defined types were placed in this encounter.     Procedures: Large Joint Inj: L knee on 05/08/2020 9:32 AM Indications: pain Details: 22 G 1.5 in needle, anterolateral approach  Arthrogram: No  Medications: 3 mL lidocaine 1 %; 40 mg methylPREDNISolone acetate 40 MG/ML Aspirate: 5 mL yellow and blood-tinged Outcome: tolerated well, no immediate complications Procedure, treatment alternatives, risks and benefits explained, specific risks discussed. Consent was given by the patient. Immediately prior to procedure a time out was called to verify the correct patient, procedure, equipment, support staff and site/side marked as required. Patient was prepped and draped in the usual sterile fashion.       Clinical Data: No additional findings.   Subjective: Chief Complaint  Patient presents with  . Left Knee - Pain    HPI [name] returns today due to right knee pain.  She states the knee pain is been ongoing for the past month.  There is a pain is medial aspect and posterior aspect of the knee.  She has giving way sensation in the.  Also grinding in the knee.  No known injury.  She denies any swelling.  Patient is nondiabetic. Review of Systems See HPI otherwise  negative or noncontributory.  Objective: Vital Signs: Ht 5' 1.42" (1.56 m)   Wt 180 lb 6.4 oz (81.8 kg)   LMP  (LMP Unknown)   BMI 33.62 kg/m   Physical Exam Constitutional:      Appearance: She is not ill-appearing or diaphoretic.  Pulmonary:     Effort: Pulmonary effort is normal.  Neurological:     Mental Status: She is alert and oriented to person, place, and time.  Psychiatric:        Mood and Affect: Mood normal.     Ortho Exam Bilateral knees good range of motion both knees.  Significant patellofemoral crepitus with passive range of motion of the left knee.  Tenderness left knee over medial lateral joint line.  No instability valgus varus stressing of either knee.  Anterior drawer Lachman's negative.  Slight effusion no abnormal warmth left knee.  Right knee with well-healed surgical incision no signs of infection. Specialty Comments:  No specialty comments available.  Imaging: XR Knee 1-2 Views Left  Result Date: 05/08/2020 Left knee: AP lateral views: No acute fracture.  Mild medial compartmental narrowing with mild patellofemoral changes.  No subluxation dislocation.    PMFS History: Patient Active Problem List   Diagnosis Date Noted  . 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 Pain due to unicompartmental  arthroplasty of knee ([place]) 06/15/2019  . Acid reflux 04/06/2018  . 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 Arthritis   . Cancer ([place])   . History of chicken pox   . History of colon polyps   . History of mouth cancer   . Osteoarthritis of knee    Medial compartment, Right    History reviewed. No pertinent family history.  Past Surgical History:  Procedure Laterality Date  . APPENDECTOMY    . DILATION AND CURETTAGE OF UTERUS    . 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 KNEE ARTHROSCOPY W/ MENISCAL REPAIR Left   . PARTIAL KNEE ARTHROPLASTY Right 09/16/2017    . PARTIAL KNEE ARTHROPLASTY Right 09/16/2017   Procedure: RIGHT UNICOMPARTMENTAL KNEE;  Surgeon: Mcarthur Rossetti, MD;  Location: [facility];  Service: Orthopedics;  Laterality: Right;  . SHOULDER ARTHROSCOPY Left 11/2018  . TOTAL KNEE REVISION Right 06/15/2019   Procedure: RIGHT UNICOMPARTMENTAL ARTHROPLASTY TO TOTAL KNEE ARTHROPLASTY;  Surgeon: Mcarthur Rossetti, MD;  Location: [facility];  Service: Orthopedics;  Laterality: Right;  . TUBAL LIGATION     Social History   Occupational History  . Not on file  Tobacco Use  . Smoking status: Former Smoker    Quit date: 12/28/1986    Years since quitting: 33.3  . Smokeless tobacco: Never Used  Vaping Use  . Vaping Use: Never used  Substance and Sexual Activity  . Alcohol use: No  . Drug use: No  . Sexual activity: Not Currently

## 2020-05-22 ENCOUNTER — Ambulatory Visit: Payer: PPO | Admitting: Physician Assistant

## 2020-05-22 ENCOUNTER — Telehealth: Payer: Self-pay | Admitting: Radiology

## 2020-05-22 ENCOUNTER — Encounter: Payer: Self-pay | Admitting: Physician Assistant

## 2020-05-22 DIAGNOSIS — M1712 Unilateral primary osteoarthritis, left knee: Secondary | ICD-10-CM | POA: Diagnosis not present

## 2020-05-22 NOTE — Progress Notes (Signed)
HPI: [name] returns today follow-up of her left knee pain.  She states that the cortisone injection helped for about 50%.  Main complaint is popping cracking in the knee gives way at times most her pain is medial aspect of the knee.  States pain is 5-6 out of 10 pain most of the time.  She is taking Tylenol Extra Strength and icing the knee.  Given radiographs of her left knee showed some mild medial compartmental narrowing.  Also mild patellofemoral changes.  Review of systems: No fevers or chills.  See HPI otherwise negative or noncontributory.  Physical exam: General: Well-developed well-nourished female no acute distress mood affect appropriate. Left knee: Good range of motion left knee.  Positive patellofemoral crepitus with passive range of motion of the knee.  Tenderness along medial joint line.  No abnormal warmth erythema or effusion.  No instability valgus varus stressing.  Nontender over the pes anserinus region.  Impression: Left knee osteoarthritis  Plan: Discussed with her quad strengthening exercises.  She is riding a stationary bike encouraged her to continue riding bike for exercise to help with quad strengthening.  We will try to obtain approval for supplemental injection of the left knee and have her back once available.  Questions were encouraged and answered.

## 2020-05-22 NOTE — Telephone Encounter (Signed)
Left knee supplemental injection

## 2020-05-23 ENCOUNTER — Telehealth: Payer: Self-pay

## 2020-05-23 NOTE — Telephone Encounter (Signed)
Noted  

## 2020-05-23 NOTE — Telephone Encounter (Signed)
Submitted VOB, Monovisc, left knee. 

## 2020-05-24 ENCOUNTER — Telehealth: Payer: Self-pay

## 2020-05-24 NOTE — Telephone Encounter (Signed)
Approved, Monovisc, left knee. [place] Patient will be responsible for 20% OOP. Co-pay of $30.00 No PA required  Appt. 06/26/2020

## 2020-05-30 DIAGNOSIS — N3941 Urge incontinence: Secondary | ICD-10-CM | POA: Diagnosis not present

## 2020-05-31 DIAGNOSIS — H6123 Impacted cerumen, bilateral: Secondary | ICD-10-CM | POA: Diagnosis not present

## 2020-05-31 DIAGNOSIS — H9193 Unspecified hearing loss, bilateral: Secondary | ICD-10-CM | POA: Insufficient documentation

## 2020-05-31 DIAGNOSIS — Z85818 Personal history of malignant neoplasm of other sites of lip, oral cavity, and pharynx: Secondary | ICD-10-CM | POA: Diagnosis not present

## 2020-06-26 ENCOUNTER — Encounter: Payer: Self-pay | Admitting: Orthopaedic Surgery

## 2020-06-26 ENCOUNTER — Ambulatory Visit: Payer: PPO | Admitting: Orthopaedic Surgery

## 2020-06-26 ENCOUNTER — Other Ambulatory Visit: Payer: Self-pay

## 2020-06-26 DIAGNOSIS — M1712 Unilateral primary osteoarthritis, left knee: Secondary | ICD-10-CM

## 2020-06-26 DIAGNOSIS — M7542 Impingement syndrome of left shoulder: Secondary | ICD-10-CM | POA: Diagnosis not present

## 2020-06-26 MED ORDER — METHYLPREDNISOLONE ACETATE 40 MG/ML IJ SUSP
40.0000 mg | INTRAMUSCULAR | Status: AC | PRN
  Administered 2020-06-26: 40 mg via INTRA_ARTICULAR

## 2020-06-26 MED ORDER — LIDOCAINE HCL 1 % IJ SOLN
3.0000 mL | INTRAMUSCULAR | Status: AC | PRN
Start: 2020-06-26 — End: 2020-06-26
  Administered 2020-06-26: 3 mL

## 2020-06-26 MED ORDER — HYALURONAN 88 MG/4ML IX SOSY
88.0000 mg | PREFILLED_SYRINGE | INTRA_ARTICULAR | Status: AC | PRN
Start: 2020-06-26 — End: 2020-06-26
  Administered 2020-06-26: 88 mg via INTRA_ARTICULAR

## 2020-06-26 NOTE — Progress Notes (Signed)
° °  Procedure Note  Patient: [name]             Date of Birth: 10/21/1940           MRN: [id number]             Visit Date: 06/26/2020  Procedures: Visit Diagnoses:  1. Primary osteoarthritis of left knee   2. Impingement syndrome of left shoulder     Large Joint Inj: L knee on 06/26/2020 8:12 AM Indications: diagnostic evaluation and pain Details: 22 G 1.5 in needle, superolateral approach  Arthrogram: No  Medications: 88 mg Hyaluronan 88 MG/4ML Outcome: tolerated well, no immediate complications Procedure, treatment alternatives, risks and benefits explained, specific risks discussed. Consent was given by the patient. Immediately prior to procedure a time out was called to verify the correct patient, procedure, equipment, support staff and site/side marked as required. Patient was prepped and draped in the usual sterile fashion.   Large Joint Inj: L subacromial bursa on 06/26/2020 8:14 AM Indications: pain and diagnostic evaluation Details: 22 G 1.5 in needle  Arthrogram: No  Medications: 3 mL lidocaine 1 %; 40 mg methylPREDNISolone acetate 40 MG/ML Outcome: tolerated well, no immediate complications Procedure, treatment alternatives, risks and benefits explained, specific risks discussed. Consent was given by the patient. Immediately prior to procedure a time out was called to verify the correct patient, procedure, equipment, support staff and site/side marked as required. Patient was prepped and draped in the usual sterile fashion.    The patient comes in today for scheduled hyaluronic acid injection with Monovisc to treat the pain of osteoarthritis in her left knee.  However she want me to also see her for her left shoulder.  Remotely we did perform an arthroscopic intervention with that shoulder with subsequent decompression and extensive debridement.  This was done years ago.  She has been hurting for the last month or 2 with overhead activities and reaching behind her  with no known injury.  She understands fully while she is having a Monovisc injection today in her left knee and like to consider steroid injection in her left shoulder today.  Examination of her left shoulder does show pain with overhead activities and reaching behind her.  There is some slight weakness in the rotator cuff as well.  She will be 79 years old next month.  Emanation of her left knee shows no effusion with good range of motion.  I did place a Monovisc injection in the left knee without difficulty.  Also placed a sterile injection in the left shoulder subacromial space.  The risk benefits of both types of injections were discussed in detail.  We had a long thorough discussion about her shoulder and her knee.  Follow-up can be as needed.  She understands we can always repeat a steroid injection in both areas in 3 months from now if she needs it.  All questions and concerns were answered and addressed.

## 2020-06-27 DIAGNOSIS — N3941 Urge incontinence: Secondary | ICD-10-CM | POA: Diagnosis not present

## 2020-07-07 DIAGNOSIS — S8262XA Displaced fracture of lateral malleolus of left fibula, initial encounter for closed fracture: Secondary | ICD-10-CM | POA: Diagnosis not present

## 2020-07-08 DIAGNOSIS — S8262XA Displaced fracture of lateral malleolus of left fibula, initial encounter for closed fracture: Secondary | ICD-10-CM | POA: Diagnosis not present

## 2020-07-10 ENCOUNTER — Encounter: Payer: Self-pay | Admitting: Physician Assistant

## 2020-07-10 ENCOUNTER — Ambulatory Visit (INDEPENDENT_AMBULATORY_CARE_PROVIDER_SITE_OTHER): Payer: PPO | Admitting: Physician Assistant

## 2020-07-10 DIAGNOSIS — S8262XA Displaced fracture of lateral malleolus of left fibula, initial encounter for closed fracture: Secondary | ICD-10-CM | POA: Diagnosis not present

## 2020-07-10 DIAGNOSIS — S93401A Sprain of unspecified ligament of right ankle, initial encounter: Secondary | ICD-10-CM | POA: Diagnosis not present

## 2020-07-10 NOTE — Progress Notes (Signed)
Office Visit Note   Patient: [name]           Date of Birth: Jan 08, 1941           MRN: [id number] Visit Date: 07/10/2020              Requested by: Jearld Fenton, NP [address] [place],  [place] [address] PCP: Jearld Fenton, NP   Assessment & Plan: Visit Diagnoses:  1. Closed displaced fracture of lateral malleolus of left fibula, initial encounter   2. Sprain of unspecified ligament of right ankle, initial encounter     Plan: We will transition her over to an ASO brace on the right side brace is given to her today and she is shown how to apply it.  In regards to the left ankle we will try to treat this nonoperatively.  That would like 3 views of the left ankle return in 2 weeks out of the splint.  Questions were encouraged and answered at length.  Elevation wiggling toes encouraged.  She is touchdown weightbearing left ankle for transfers.  Follow-Up Instructions: Return in about 2 weeks (around 07/24/2020) for Radiographs.   Orders:  No orders of the defined types were placed in this encounter.  No orders of the defined types were placed in this encounter.     Procedures: No procedures performed   Clinical Data: No additional findings.   Subjective: Chief Complaint  Patient presents with  . Left Ankle - Fracture  . Right Ankle - Injury    HPI  [name] is well-known to [name] service comes in today with bilateral ankle pain.  She states she was in her [place] this past Friday and slipped coming down mainly her body weight on her left ankle.  She was seen at [facility] and was told that she had a right ankle sprain and a left ankle fracture.  She is placed in a cam walker boot on the right leg and the posterior splint on the left leg appropriately.  She has been weightbearing mostly on the right leg and just using the left leg for transfers.  She had no loss of consciousness no dizziness at the time of injury.  Is taking Tylenol for  pain. Radiographs are reviewed on a disc that the patient brings.  The right ankle is well located.  There is no acute fractures no bony abnormalities.  Left ankle lateral malleolus fracture with minimal displacement.  The talus is well located within the ankle mortise no diastases.  No other fractures identified.  Review of Systems See HPI  Objective: Vital Signs: LMP  (LMP Unknown)   Physical Exam Constitutional:      Appearance: She is not ill-appearing or diaphoretic.  Pulmonary:     Effort: Pulmonary effort is normal.  Neurological:     Mental Status: She is alert and oriented to person, place, and time.  Psychiatric:        Mood and Affect: Mood normal.     Ortho Exam Left  Lower extremity: Calf supple non tender.  Splint clean dry and intact. Toes sensation intact. Proximal tibia and fibula non tender.  Right lower extremity: Calf supple and non tender. Tenderness anterior lateral ankle. Ecchymosis over lateral ankle. Proximal tibia and fibula non tender.  Specialty Comments:  No specialty comments available.  Imaging: No results found.   PMFS History: Patient Active Problem List   Diagnosis Date Noted  . Status post total  right knee replacement 12/22/2019  . Pain due to unicompartmental arthroplasty of knee ([place]) 06/15/2019  . Acid reflux 04/06/2018  . 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 Arthritis   . Cancer ([facility])   . History of chicken pox   . History of colon polyps   . History of mouth cancer   . Osteoarthritis of knee    Medial compartment, Right    History reviewed. No pertinent family history.  Past Surgical History:  Procedure Laterality Date  . APPENDECTOMY    . DILATION AND CURETTAGE OF UTERUS    . 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 KNEE ARTHROSCOPY W/ MENISCAL REPAIR Left   . PARTIAL KNEE ARTHROPLASTY Right 09/16/2017  . PARTIAL KNEE ARTHROPLASTY Right  09/16/2017   Procedure: RIGHT UNICOMPARTMENTAL KNEE;  Surgeon: Mcarthur Rossetti, MD;  Location: [facility];  Service: Orthopedics;  Laterality: Right;  . SHOULDER ARTHROSCOPY Left 11/2018  . TOTAL KNEE REVISION Right 06/15/2019   Procedure: RIGHT UNICOMPARTMENTAL ARTHROPLASTY TO TOTAL KNEE ARTHROPLASTY;  Surgeon: Mcarthur Rossetti, MD;  Location: [facility];  Service: Orthopedics;  Laterality: Right;  . TUBAL LIGATION     Social History   Occupational History  . Not on file  Tobacco Use  . Smoking status: Former Smoker    Quit date: 12/28/1986    Years since quitting: 33.5  . Smokeless tobacco: Never Used  Vaping Use  . Vaping Use: Never used  Substance and Sexual Activity  . Alcohol use: No  . Drug use: No  . Sexual activity: Not Currently

## 2020-07-19 DIAGNOSIS — N3946 Mixed incontinence: Secondary | ICD-10-CM | POA: Diagnosis not present

## 2020-07-21 ENCOUNTER — Other Ambulatory Visit: Payer: Self-pay | Admitting: Internal Medicine

## 2020-07-21 DIAGNOSIS — Z1231 Encounter for screening mammogram for malignant neoplasm of breast: Secondary | ICD-10-CM

## 2020-07-24 ENCOUNTER — Ambulatory Visit: Payer: PPO | Admitting: Physician Assistant

## 2020-07-24 ENCOUNTER — Encounter: Payer: Self-pay | Admitting: Physician Assistant

## 2020-07-24 ENCOUNTER — Ambulatory Visit: Payer: Self-pay

## 2020-07-24 DIAGNOSIS — S8262XD Displaced fracture of lateral malleolus of left fibula, subsequent encounter for closed fracture with routine healing: Secondary | ICD-10-CM

## 2020-07-24 DIAGNOSIS — M25572 Pain in left ankle and joints of left foot: Secondary | ICD-10-CM | POA: Diagnosis not present

## 2020-07-24 MED ORDER — TIZANIDINE HCL 2 MG PO CAPS
2.0000 mg | ORAL_CAPSULE | Freq: Every day | ORAL | 1 refills | Status: DC
Start: 2020-07-24 — End: 2020-08-22

## 2020-07-24 NOTE — Progress Notes (Signed)
Office Visit Note   Patient: [name]           Date of Birth: [birth year]           MRN: [id number] Visit Date: 07/24/2020              Requested by: Jearld Fenton, NP [address] [place],  [place] [address] PCP: Jearld Fenton, NP   Assessment & Plan: Visit Diagnoses:  1. Closed displaced fracture of lateral malleolus of left fibula with routine healing, subsequent encounter     Plan: She will continue the ASO brace for the right leg.  She is weightbearing as tolerated on the right.  Regards to the left ankle we will place her in a cam walker boot touchdown weightbearing for balance.  She can take the cam walker boot off the left leg for hygiene purposes and also when she is just relaxing.  Otherwise recommend elevation wiggling toes bilaterally.  We will see her back in 4 weeks at that time obtain 3 views of the left ankle.  She will continue the ASO on the right lower extremity until follow-up in 1 month.  Questions were encouraged and answered at length  Follow-Up Instructions: Return in about 4 weeks (around 08/21/2020) for Radiographs.   Orders:  Orders Placed This Encounter  Procedures  . XR Ankle Complete Left   Meds ordered this encounter  Medications  . tizanidine (ZANAFLEX) 2 MG capsule    Sig: Take 1 capsule (2 mg total) by mouth at bedtime.    Dispense:  20 capsule    Refill:  1      Procedures: No procedures performed   Clinical Data: No additional findings.   Subjective: Chief Complaint  Patient presents with  . Left Ankle - Follow-up, Pain    HPI Mrs. [name] is now just over 2 weeks status post left ankle lateral malleolus fracture and right ankle sprain.  She states that both ankles are doing well moderate.  Is taking Tylenol for pain.  She is having some muscle spasm particularly at night and in the left leg.  She has had no fever chills shortness of breath or chest pain.  She has been basically nonweightbearing on the left leg  only putting some weight for transfers.  Review of Systems  Constitutional: Negative for chills and fever.     Objective: Vital Signs: LMP  (LMP Unknown)   Physical Exam General: Well-developed well-nourished female no acute distress. Ortho Exam Right ankle she has tenderness over the lateral malleolus no tenderness over the medial malleolus.  Good range of motion of the right ankle without pain.  Right calf supple nontender.  Left calf supple nontender.  She has significant bruising left lower leg.  Tenderness over the lateral malleolus only.  There is no impending ulcers or skin breakdown of the left lower leg. Specialty Comments:  No specialty comments available.  Imaging: XR Ankle Complete Left  Result Date: 07/24/2020 Left ankle 3 views: Talus well located in the ankle mortise.  Lateral malleolus fracture Overall good position alignment.  Very early callus formation can be seen on the oblique view.  No other bony abnormalities.    PMFS History: Patient Active Problem List   Diagnosis Date Noted  . Bilateral hearing loss 05/31/2020  . Bilateral impacted cerumen 05/31/2020  . 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 Pain due to unicompartmental arthroplasty of knee ([place]) 06/15/2019  . Acid reflux 04/06/2018  .  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 Arthritis   . Cancer ([place])   . History of chicken pox   . History of colon polyps   . History of mouth cancer   . Osteoarthritis of knee    Medial compartment, Right    History reviewed. No pertinent family history.  Past Surgical History:  Procedure Laterality Date  . APPENDECTOMY    . DILATION AND CURETTAGE OF UTERUS    . 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 KNEE ARTHROSCOPY W/ MENISCAL REPAIR Left   . PARTIAL KNEE ARTHROPLASTY Right 09/16/2017  . PARTIAL KNEE ARTHROPLASTY Right 09/16/2017   Procedure: RIGHT  UNICOMPARTMENTAL KNEE;  Surgeon: Mcarthur Rossetti, MD;  Location: [facility];  Service: Orthopedics;  Laterality: Right;  . SHOULDER ARTHROSCOPY Left 11/2018  . TOTAL KNEE REVISION Right 06/15/2019   Procedure: RIGHT UNICOMPARTMENTAL ARTHROPLASTY TO TOTAL KNEE ARTHROPLASTY;  Surgeon: Mcarthur Rossetti, MD;  Location: [facility];  Service: Orthopedics;  Laterality: Right;  . TUBAL LIGATION     Social History   Occupational History  . Not on file  Tobacco Use  . Smoking status: Former Smoker    Quit date: 12/28/1986    Years since quitting: 33.5  . Smokeless tobacco: Never Used  Vaping Use  . Vaping Use: Never used  Substance and Sexual Activity  . Alcohol use: No  . Drug use: No  . Sexual activity: Not Currently

## 2020-07-25 DIAGNOSIS — N3941 Urge incontinence: Secondary | ICD-10-CM | POA: Diagnosis not present

## 2020-08-09 DIAGNOSIS — H2513 Age-related nuclear cataract, bilateral: Secondary | ICD-10-CM | POA: Diagnosis not present

## 2020-08-21 ENCOUNTER — Encounter: Payer: Self-pay | Admitting: Physician Assistant

## 2020-08-21 ENCOUNTER — Ambulatory Visit (INDEPENDENT_AMBULATORY_CARE_PROVIDER_SITE_OTHER): Payer: PPO

## 2020-08-21 ENCOUNTER — Ambulatory Visit: Payer: PPO | Admitting: Physician Assistant

## 2020-08-21 DIAGNOSIS — S8262XD Displaced fracture of lateral malleolus of left fibula, subsequent encounter for closed fracture with routine healing: Secondary | ICD-10-CM

## 2020-08-21 DIAGNOSIS — S93401A Sprain of unspecified ligament of right ankle, initial encounter: Secondary | ICD-10-CM

## 2020-08-21 NOTE — Progress Notes (Signed)
Office Visit Note   Patient: [name]           Date of Birth: 15-Nov-1940           MRN: [id number] Visit Date: 08/21/2020              Requested by: Jearld Fenton, NP [address] [place],  [place] [address] PCP: Jearld Fenton, NP   Assessment & Plan: Visit Diagnoses:  1. Closed displaced fracture of lateral malleolus of left fibula with routine healing, subsequent encounter   2. Sprain of unspecified ligament of right ankle, initial encounter     Plan: We will have her continue the ASO brace on the right ankle for the next 2 weeks at all times when ambulating.  Then in 2 weeks she will start wearing it only when not going out of the house.  For the next 2 weeks she will continue to ambulate in the cam walker boot on the left ankle then transition to the ASO brace that she is given today on the left ankle.  We will see her back in 1 month at that time we will obtain 3 views of the left ankle.  Questions were encouraged and answered today.  Follow-Up Instructions: Return in about 4 weeks (around 09/18/2020) for Radiographs.   Orders:  Orders Placed This Encounter  Procedures  . XR Ankle Complete Left   No orders of the defined types were placed in this encounter.     Procedures: No procedures performed   Clinical Data: No additional findings.   Subjective: Chief Complaint  Patient presents with  . Left Ankle - Pain    HPI [name] returns today follow-up of her left ankle lateral malleolus fracture and right ankle sprain.  She is now 6 weeks status post injury to both ankles.  States overall she is doing better.  She has some pain involving the left ankle 1 weightbearing.  She states she does not weight-bear a lot in the cam walker boot.  She mostly puts most of her weight on the right ankle and states it only bothers her whenever she moves a certain way.  Is taking Tylenol for pain. Review of Systems Negative for fevers or  chills.  Objective: Vital Signs: LMP  (LMP Unknown)   Physical Exam Pulmonary:     Effort: Pulmonary effort is normal.  Neurological:     Mental Status: She is oriented to person, place, and time.  Psychiatric:        Mood and Affect: Mood normal.     Ortho Exam Right ankle and she is able dorsiflex plantarflex ankle.  Has 5/5 strength with inversion eversion.  No tenderness over the lateral malleolus.  Right calf supple nontender.  Left ankle tenderness over the lateral malleolus no tenderness over the medial malleolus.  Full dorsiflexion plantarflexion.  Left calf is supple and nontender. Specialty Comments:  No specialty comments available.  Imaging: XR Ankle Complete Left  Result Date: 08/21/2020 3 views left ankle: Findings: Talus well located within the ankle mortise no diastases.  No evidence of fracture involving the medial malleolus.  Lateral malleolus fracture remains in acceptable position alignment.  There are some early signs of consolidation.  Fracture site is still visible.  No other bony abnormalities.    PMFS History: Patient Active Problem List   Diagnosis Date Noted  . Bilateral hearing loss 05/31/2020  . Bilateral impacted cerumen 05/31/2020  . Status post total right  knee replacement 12/22/2019  . Pain due to unicompartmental arthroplasty of knee ([place] [facility]) 06/15/2019  . Acid reflux 04/06/2018  . 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 Arthritis   . Cancer ([facility])   . History of chicken pox   . History of colon polyps   . History of mouth cancer   . Osteoarthritis of knee    Medial compartment, Right    History reviewed. No pertinent family history.  Past Surgical History:  Procedure Laterality Date  . APPENDECTOMY    . DILATION AND CURETTAGE OF UTERUS    . 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 KNEE ARTHROSCOPY W/ MENISCAL REPAIR Left   . PARTIAL KNEE ARTHROPLASTY  Right 09/16/2017  . PARTIAL KNEE ARTHROPLASTY Right 09/16/2017   Procedure: RIGHT UNICOMPARTMENTAL KNEE;  Surgeon: Mcarthur Rossetti, MD;  Location: [facility];  Service: Orthopedics;  Laterality: Right;  . SHOULDER ARTHROSCOPY Left 11/2018  . TOTAL KNEE REVISION Right 06/15/2019   Procedure: RIGHT UNICOMPARTMENTAL ARTHROPLASTY TO TOTAL KNEE ARTHROPLASTY;  Surgeon: Mcarthur Rossetti, MD;  Location: [facility];  Service: Orthopedics;  Laterality: Right;  . TUBAL LIGATION     Social History   Occupational History  . Not on file  Tobacco Use  . Smoking status: Former Smoker    Quit date: 12/28/1986    Years since quitting: 33.6  . Smokeless tobacco: Never Used  Vaping Use  . Vaping Use: Never used  Substance and Sexual Activity  . Alcohol use: No  . Drug use: No  . Sexual activity: Not Currently

## 2020-08-22 ENCOUNTER — Other Ambulatory Visit: Payer: Self-pay

## 2020-08-22 ENCOUNTER — Ambulatory Visit (INDEPENDENT_AMBULATORY_CARE_PROVIDER_SITE_OTHER): Payer: PPO | Admitting: Internal Medicine

## 2020-08-22 ENCOUNTER — Encounter: Payer: Self-pay | Admitting: Internal Medicine

## 2020-08-22 VITALS — BP 124/84 | HR 81 | Temp 97.2°F | Ht 62.0 in | Wt 180.0 lb

## 2020-08-22 DIAGNOSIS — M150 Primary generalized (osteo)arthritis: Secondary | ICD-10-CM | POA: Insufficient documentation

## 2020-08-22 DIAGNOSIS — K219 Gastro-esophageal reflux disease without esophagitis: Secondary | ICD-10-CM | POA: Diagnosis not present

## 2020-08-22 DIAGNOSIS — N3281 Overactive bladder: Secondary | ICD-10-CM | POA: Diagnosis not present

## 2020-08-22 DIAGNOSIS — M159 Polyosteoarthritis, unspecified: Secondary | ICD-10-CM | POA: Insufficient documentation

## 2020-08-22 DIAGNOSIS — N3941 Urge incontinence: Secondary | ICD-10-CM | POA: Diagnosis not present

## 2020-08-22 DIAGNOSIS — M8949 Other hypertrophic osteoarthropathy, multiple sites: Secondary | ICD-10-CM

## 2020-08-22 DIAGNOSIS — Z23 Encounter for immunization: Secondary | ICD-10-CM

## 2020-08-22 DIAGNOSIS — Z0000 Encounter for general adult medical examination without abnormal findings: Secondary | ICD-10-CM | POA: Diagnosis not present

## 2020-08-22 LAB — LIPID PANEL
Cholesterol: 186 mg/dL (ref 0–200)
HDL: 44.2 mg/dL (ref >39.00)
LDL Cholesterol: 111 mg/dL — ABNORMAL HIGH (ref 0–99)
NonHDL: 141.31
Total CHOL/HDL Ratio: 4
Triglycerides: 152 mg/dL — ABNORMAL HIGH (ref 0.0–149.0)
VLDL: 30.4 mg/dL (ref 0.0–40.0)

## 2020-08-22 LAB — COMPREHENSIVE METABOLIC PANEL
ALT: 17 U/L (ref 0–35)
AST: 20 U/L (ref 0–37)
Albumin: 4.1 g/dL (ref 3.5–5.2)
Alkaline Phosphatase: 67 U/L (ref 39–117)
BUN: 16 mg/dL (ref 6–23)
CO2: 30 mEq/L (ref 19–32)
Calcium: 9.2 mg/dL (ref 8.4–10.5)
Chloride: 102 mEq/L (ref 96–112)
Creatinine, Ser: 0.74 mg/dL (ref 0.40–1.20)
GFR: 77.15 mL/min (ref >60.00)
Glucose, Bld: 100 mg/dL — ABNORMAL HIGH (ref 70–99)
Potassium: 4.3 mEq/L (ref 3.5–5.1)
Sodium: 139 mEq/L (ref 135–145)
Total Bilirubin: 0.5 mg/dL (ref 0.2–1.2)
Total Protein: 6.9 g/dL (ref 6.0–8.3)

## 2020-08-22 LAB — CBC
HCT: 45.1 % (ref 36.0–46.0)
Hemoglobin: 14.9 g/dL (ref 12.0–15.0)
MCHC: 33 g/dL (ref 30.0–36.0)
MCV: 89.6 fl (ref 78.0–100.0)
Platelets: 197 10*3/uL (ref 150.0–400.0)
RBC: 5.04 Mil/uL (ref 3.87–5.11)
RDW: 13.8 % (ref 11.5–15.5)
WBC: 6.3 10*3/uL (ref 4.0–10.5)

## 2020-08-22 LAB — VITAMIN D 25 HYDROXY (VIT D DEFICIENCY, FRACTURES): VITD: 57.68 ng/mL (ref 30.00–100.00)

## 2020-08-22 NOTE — Patient Instructions (Signed)

## 2020-08-22 NOTE — Assessment & Plan Note (Signed)
Continue Duloxetine and Glucosamin/Condriotin

## 2020-08-22 NOTE — Progress Notes (Signed)
HPI:  Pt presents to the clinic today for her subsequent annual Medicare Wellness Exam.  OA: Mainly in her fingers and knees. Managed on Duloxetine, Glucosamine/Condroitin and Gabapentin. She follows with Dr Ninfa Linden.  GERD: She denies breakthrough on Omeprazole. Upper GI from 05/2018 reviewed.  OAB: She is taking Myrbetriq and getting shock therapy. She follows with [facility].  Past Medical History:  Diagnosis Date  . Arthritis   . Cancer ([facility])   . History of chicken pox   . History of colon polyps   . History of mouth cancer   . Osteoarthritis of knee    Medial compartment, Right    Current Outpatient Medications  Medication Sig Dispense Refill  . aspirin EC 81 MG tablet Take 81 mg by mouth daily.    . Calcium Carb-Cholecalciferol (CALCIUM 600/VITAMIN D3) 600-800 MG-UNIT TABS Take 1 tablet by mouth 2 (two) times daily.     . cetirizine (ZYRTEC) 10 MG tablet Take 10 mg by mouth daily.    Marland Kitchen esomeprazole (NEXIUM) 20 MG capsule Take 20 mg by mouth daily before breakfast.     . fluticasone (FLONASE) 50 MCG/ACT nasal spray Place 2 sprays into the nose at bedtime.     . Glucosamine-Chondroitin (COSAMIN DS PO) Take 1 tablet by mouth 2 (two) times daily.    . Multiple Vitamins-Minerals (MULTIVITAMIN WITH MINERALS) tablet Take 1 tablet by mouth every evening. Women's One-A-Day    . Omega-3 Fatty Acids (FISH OIL) 1000 MG CAPS Take 1,000 mg by mouth daily.    . tizanidine (ZANAFLEX) 2 MG capsule Take 1 capsule (2 mg total) by mouth at bedtime. 20 capsule 1  . TURMERIC PO Take 538 mg by mouth daily.     No current facility-administered medications for this visit.    Allergies  Allergen Reactions  . Ibuprofen Nausea And Vomiting and Other (See Comments)    GI upset, GERD    No family history on file.  Social History   Socioeconomic History  . Marital status: Married    Spouse name: Not on file  . Number of children: Not on file  . Years of education: Not on file  .  Highest education level: Not on file  Occupational History  . Not on file  Tobacco Use  . Smoking status: Former Smoker    Quit date: 12/28/1986    Years since quitting: 33.6  . Smokeless tobacco: Never Used  Vaping Use  . Vaping Use: Never used  Substance and Sexual Activity  . Alcohol use: No  . Drug use: No  . Sexual activity: Not Currently  Other Topics Concern  . Not on file  Social History Narrative  . Not on file   Social Determinants of Health   Financial Resource Strain:   . Difficulty of Paying Living Expenses: Not on file  Food Insecurity:   . Worried About Charity fundraiser in the Last Year: Not on file  . Ran Out of Food in the Last Year: Not on file  Transportation Needs:   . Lack of Transportation (Medical): Not on file  . Lack of Transportation (Non-Medical): Not on file  Physical Activity:   . Days of Exercise per Week: Not on file  . Minutes of Exercise per Session: Not on file  Stress:   . Feeling of Stress : Not on file  Social Connections:   . Frequency of Communication with Friends and Family: Not on file  . Frequency of Social Gatherings with Friends  and Family: Not on file  . Attends Religious Services: Not on file  . Active Member of Clubs or Organizations: Not on file  . Attends Archivist Meetings: Not on file  . Marital Status: Not on file  Intimate Partner Violence:   . Fear of Current or Ex-Partner: Not on file  . Emotionally Abused: Not on file  . Physically Abused: Not on file  . Sexually Abused: Not on file    Hospitiliaztions: None  Health Maintenance:    Flu: 07/2019  Tetanus: 07/2012  Pneumovax: 07/2007  Prevnar: 07/2014  Covid: Moderna  Zostavax: 07/2008  Shingrix: 08/2018, 11/2018  Mammogram: 08/2019, schedule  Pap Smear: no longer screening  Bone Density: 08/2016  Colon Screening: 10/2009  Eye Doctor: annually  Dental Exam: as needed   Providers:   PCP: Webb Silversmith, NP  Orthopedist: Dr.  Rush Farmer  Urology: [facility]   I have personally reviewed and have noted:  1. The patient's medical and social history 2. Their use of alcohol, tobacco or illicit drugs 3. Their current medications and supplements 4. The patient's functional ability including ADL's, fall risks, home safety risks and hearing or visual impairment. 5. Diet and physical activities 6. Evidence for depression or mood disorder  Subjective:   Review of Systems:   Constitutional: Denies fever, malaise, fatigue, headache or abrupt weight changes.  HEENT: Denies eye pain, eye redness, ear pain, ringing in the ears, wax buildup, runny nose, nasal congestion, bloody nose, or sore throat. Respiratory: Denies difficulty breathing, shortness of breath, cough or sputum production.   Cardiovascular: Denies chest pain, chest tightness, palpitations or swelling in the hands or feet.  Gastrointestinal: Denies abdominal pain, bloating, constipation, diarrhea or blood in the stool.  GU: Pt reports urinary urgency, incontinence. Denies frequency, pain with urination, burning sensation, blood in urine, odor or discharge. Musculoskeletal: Pt reports intermittent joint pain, left ankle in boot, difficulty with gait. Denies decrease in range of motion, muscle pain or joint swelling.  Skin: Denies redness, rashes, lesions or ulcercations.  Neurological: Denies dizziness, difficulty with memory, difficulty with speech or problems with balance and coordination.  Psych: Denies anxiety, depression, SI/HI.  No other specific complaints in a complete review of systems (except as listed in HPI above).  Objective:  PE:   BP 124/84   Pulse 81   Temp (!) 97.2 F (36.2 C) (Temporal)   Ht 5\' 2"  (1.575 m)   Wt 180 lb (81.6 kg)   LMP  (LMP Unknown)   SpO2 97%   BMI 32.92 kg/m   Wt Readings from Last 3 Encounters:  05/08/20 180 lb 6.4 oz (81.8 kg)  07/29/19 176 lb (79.8 kg)  06/15/19 174 lb 8 oz (79.2 kg)    General:  Appears her stated age, obese, in NAD. Skin: Warm, dry and intact. No rashesnoted. HEENT: Head: normal shape and size; Eyes: sclera white, no icterus, conjunctiva pink, PERRLA and EOMs intact;  Neck: Neck supple, trachea midline. No masses, lumps or thyromegaly present.  Cardiovascular: Normal rate and rhythm. S1,S2 noted.  No murmur, rubs or gallops noted. No JVD or BLE edema. No carotid bruits noted. Pulmonary/Chest: Normal effort and positive vesicular breath sounds. No respiratory distress. No wheezes, rales or ronchi noted.  Abdomen: Soft and nontender. Normal bowel sounds. No distention or masses noted. Liver, spleen and kidneys non palpable. Musculoskeletal: Right ankle in brace, left ankle in boot. In wheelchair. Neurological: Alert and oriented. Cranial nerves II-XII grossly intact. Coordination normal.  Psychiatric: Mood and affect normal. Behavior is normal. Judgment and thought content normal.    BMET    Component Value Date/Time   NA 140 07/29/2019 1121   K 4.1 07/29/2019 1121   CL 102 07/29/2019 1121   CO2 31 07/29/2019 1121   GLUCOSE 113 (H) 07/29/2019 1121   BUN 16 07/29/2019 1121   CREATININE 0.69 07/29/2019 1121   CREATININE 0.59 (L) 06/24/2016 0853   CALCIUM 9.6 07/29/2019 1121   GFRNONAA >60 06/16/2019 0337   GFRNONAA >89 06/24/2016 0853   GFRAA >60 06/16/2019 0337   GFRAA >89 06/24/2016 0853    Lipid Panel     Component Value Date/Time   CHOL 192 07/29/2019 1121   TRIG 120.0 07/29/2019 1121   HDL 51.90 07/29/2019 1121   CHOLHDL 4 07/29/2019 1121   VLDL 24.0 07/29/2019 1121   LDLCALC 117 (H) 07/29/2019 1121    CBC    Component Value Date/Time   WBC 6.8 07/29/2019 1121   RBC 4.57 07/29/2019 1121   HGB 13.6 07/29/2019 1121   HCT 41.3 07/29/2019 1121   PLT 221.0 07/29/2019 1121   MCV 90.5 07/29/2019 1121   MCV 86.6 06/24/2016 0909   MCH 30.0 06/16/2019 0337   MCHC 32.8 07/29/2019 1121   RDW 13.7 07/29/2019 1121    Hgb A1C No results found for:  HGBA1C    Assessment and Plan:   Medicare Annual Wellness Visit:  Diet: She does eat some meat. She does eat fruits and veggies daily. She tries to avoid fried foods. She drinks mostly water. Physical activity: Walking, exercise bike Depression/mood screen: Negative, PHQ 9 score of 0 Hearing: Intact to whispered voice Visual acuity: Grossly normal, performs annual eye exam  ADLs: Capable Fall risk: High, currently in a boot Home safety: Good Cognitive evaluation: Intact to orientation, naming, recall and repetition, MMSE score of 30 EOL planning: Adv directives, full code/ I agree  Preventative Medicine: Flu shot today. Tetanus, pneumovax, prevnar, zostovax, covid and shingrix UTD. She no longer needs pap smears. Mammogram scheduled. Will repeat bone density in 2022. Colon screening UTD. Encouraged her to consume a balanced diet and exercise regimen. Advised her to see an eye doctor and dentist annually. Will check CBC, CMET, Lipid and Vit D today. Due dates for screening exam given to patient as part of her AVS.   Next appointment: 1 year, Medicare Wellness Exam   Webb Silversmith, NP This visit occurred during the SARS-CoV-2 public health emergency.  Safety protocols were in place, including screening questions prior to the visit, additional usage of staff PPE, and extensive cleaning of exam room while observing appropriate contact time as indicated for disinfecting solutions.

## 2020-08-22 NOTE — Assessment & Plan Note (Signed)
Continue Myrbetriq  She will continue to follow with urology

## 2020-08-22 NOTE — Assessment & Plan Note (Signed)
Stable on Omeprazole CBC and CMET today Will monitor

## 2020-08-24 ENCOUNTER — Ambulatory Visit: Payer: PPO

## 2020-08-24 ENCOUNTER — Encounter: Payer: Self-pay | Admitting: Internal Medicine

## 2020-09-08 ENCOUNTER — Telehealth: Payer: Self-pay

## 2020-09-08 NOTE — Telephone Encounter (Signed)
Call pt and she if she would like to go ahead and get her bone density done given her recent fracture instead of waiting.

## 2020-09-08 NOTE — Telephone Encounter (Signed)
-----   Message from Helene Shoe, LPN sent at 35/02/7321  4:49 PM EST ----- Good afternoon,    I am with the [facility]. Your patient, [name], has had a fracture recently. Due to this fracture she has fell on the gap report for , Osteoporosis Management. It is recommended she have a Bone Density scan or be treated with an Osteoporosis Medication.    Fracture Date: 07/07/2020 Deadline: 01/03/2021    If you have any questions or concerns please feel free to contact me.    Thank you,    [facility] Quality Coordinator

## 2020-09-18 ENCOUNTER — Ambulatory Visit: Payer: PPO | Admitting: Physician Assistant

## 2020-09-18 ENCOUNTER — Encounter: Payer: Self-pay | Admitting: Physician Assistant

## 2020-09-18 ENCOUNTER — Ambulatory Visit (INDEPENDENT_AMBULATORY_CARE_PROVIDER_SITE_OTHER): Payer: PPO

## 2020-09-18 VITALS — Ht 62.0 in | Wt 180.0 lb

## 2020-09-18 DIAGNOSIS — S8262XD Displaced fracture of lateral malleolus of left fibula, subsequent encounter for closed fracture with routine healing: Secondary | ICD-10-CM | POA: Diagnosis not present

## 2020-09-18 NOTE — Progress Notes (Signed)
HPI: Ms. Quigg returns today follow-up of her right ankle sprain her left ankle lateral malleolus fracture.  She is no longer using a wheelchair to get around.  She currently is only wearing an ASO brace on the left ankle.  Taking no pain medications.  She notes she has just slight tenderness over the lateral aspect of her left ankle no pain in the right ankle.  No mechanical symptoms of either ankle.  She does ask about her knee which she has known mild arthritic changes involving the medial compartment and the patellofemoral joint occasional giving way but does feel that the supplemental injection that she had on 06/26/2020 definitely helped.   Physical exam: Bilateral ankles full dorsiflexion plantarflexion without pain.  Able to invert evert both feet without pain.  Left ankle tenderness over the lateral malleolus only.  No significant edema ecchymosis or impending ulcerations of bilateral ankles and feet.  Radiographs: Left ankle 3 views show the talus to be well located within the ankle mortise no diastases.  Lateral malleolus fracture shows further consolidation.  Fracture site is still evident though.  No change in overall position alignment.   Impression: Left ankle lateral malleolus fracture 10 weeks conservative treatment  Right ankle sprain resolving Left knee osteoarthritis  Plan: We will have her wear the ASO brace for the next 2 weeks on the left ankle when out of the home.  She will continue work on quad strengthening left knee.  Like to see her back in 6 weeks at that time obtain 3 views of the left ankle we will see her back sooner if there is any questions concerns.

## 2020-09-19 DIAGNOSIS — N3941 Urge incontinence: Secondary | ICD-10-CM | POA: Diagnosis not present

## 2020-09-22 ENCOUNTER — Telehealth: Payer: Self-pay

## 2020-09-22 DIAGNOSIS — Z780 Asymptomatic menopausal state: Secondary | ICD-10-CM

## 2020-09-22 NOTE — Telephone Encounter (Signed)
See phone note for 09/22/20 could not add the [facility] note sent as staff message to this note.

## 2020-09-22 NOTE — Telephone Encounter (Signed)
Sending this note from [place] to [employer] NP; see phone note 09/08/20.

## 2020-09-22 NOTE — Telephone Encounter (Signed)
-----   Message from Jesus Genera sent at 09/22/2020  2:10 PM EST ----- Regarding: Bone Density Good Afternoon,  I spoke with Ms. Fulwider today and she would like to proceed with having the Bone Density Scan. Can you have the provider send the order to [facility]? She said she would like to have it the same place she has her Mammogram.  Thank you,  [place]

## 2020-09-24 NOTE — Telephone Encounter (Signed)
Bone density ordered.

## 2020-09-24 NOTE — Addendum Note (Signed)
Addended by: Jearld Fenton on: 09/24/2020 05:13 PM   Modules accepted: Orders

## 2020-10-04 ENCOUNTER — Ambulatory Visit
Admission: RE | Admit: 2020-10-04 | Discharge: 2020-10-04 | Disposition: A | Discharge status: Home / Self Care | Payer: PPO | Source: Ambulatory Visit | Attending: Internal Medicine | Admitting: Internal Medicine

## 2020-10-04 ENCOUNTER — Other Ambulatory Visit: Payer: Self-pay

## 2020-10-04 DIAGNOSIS — Z1231 Encounter for screening mammogram for malignant neoplasm of breast: Secondary | ICD-10-CM

## 2020-10-17 DIAGNOSIS — N3941 Urge incontinence: Secondary | ICD-10-CM | POA: Diagnosis not present

## 2020-10-30 ENCOUNTER — Ambulatory Visit: Payer: PPO | Admitting: Physician Assistant

## 2020-10-30 ENCOUNTER — Ambulatory Visit (INDEPENDENT_AMBULATORY_CARE_PROVIDER_SITE_OTHER): Payer: PPO

## 2020-10-30 ENCOUNTER — Encounter: Payer: Self-pay | Admitting: Physician Assistant

## 2020-10-30 DIAGNOSIS — Z96651 Presence of right artificial knee joint: Secondary | ICD-10-CM

## 2020-10-30 DIAGNOSIS — S8262XK Displaced fracture of lateral malleolus of left fibula, subsequent encounter for closed fracture with nonunion: Secondary | ICD-10-CM

## 2020-10-30 DIAGNOSIS — S8262XD Displaced fracture of lateral malleolus of left fibula, subsequent encounter for closed fracture with routine healing: Secondary | ICD-10-CM

## 2020-10-30 NOTE — Progress Notes (Addendum)
Office Visit Note   Patient: [name]           Date of Birth: [birth year]           MRN: [id number] Visit Date: 10/30/2020              Requested by: Jearld Fenton, NP [address] [place],  [place] [address] PCP: Jearld Fenton, NP   Assessment & Plan: Visit Diagnoses:  1. Status post total right knee replacement   2. 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  In regards to her left ankle pain but that she continues to have pain over the lateral malleolus and has evidence of a nonunion recommend bone stimulator.  We will see her back in 6 weeks and obtain 3 views of the left ankle.  We will work on obtaining approval for the bone stimulator.  Follow-Up Instructions: Return in about 6 weeks (around 12/11/2020) for Radiographs.   Orders:  Orders Placed This Encounter  Procedures  . XR Ankle Complete Left  . XR Knee 1-2 Views Right   No orders of the defined types were placed in this encounter.     Procedures: No procedures performed   Clinical Data: No additional findings.   Subjective: Chief Complaint  Patient presents with  . Left Ankle - Follow-up, Fracture  . Right Knee - Routine Post Op, Pain    HPI [name] is well-known to [name] service comes in today for follow-up of her left ankle fracture which she sustained on October 1 of 2021.  She states the ankle is getting better.  She is having no mechanical symptoms.  She is no longer wearing the ASO brace.  Still has some pain lateral aspect ankle code. She also is having pain in the right knee she is status post right total knee arthroplasty September 2020 which was a conversion from partial knee replacement.  She has had no known injury.  Just general soreness about the knee.  Review of Systems See HPI  otherwise negative  Objective: Vital Signs: LMP  (LMP Unknown)   Physical Exam Constitutional:      Appearance: She is not ill-appearing or diaphoretic.  Pulmonary:     Effort: Pulmonary effort is normal.  Neurological:     Mental Status: She is alert and oriented to person, place, and time.  Psychiatric:        Mood and Affect: Mood normal.     Ortho Exam Right knee good range of motion.  No instability valgus varus stressing.  No abnormal warmth erythema or effusion.  Surgical incisions are well-healed.  She has tenderness over the proximal tibia but no tenderness of the joint line. Specialty Comments:  No specialty comments available.  Imaging: XR Ankle Complete Left  Result Date: 10/30/2020 Left ankle 3 views: Talus well located within the ankle mortise.  No diastases.  Medial malleolus without any acute findings.  Lateral malleolus fracture site is still evident consistent with a nonunion.  No change in overall position alignment lateral malleolus fracture.  XR Knee 1-2 Views Right  Result Date: 10/30/2020 Right knee 2 views: Total knee components are well seated.  There is no acute fractures no bony abnormalities.  No hardware failure.  Knee  is well located    PMFS History: Patient Active Problem List   Diagnosis Date Noted  . Primary osteoarthritis involving multiple joints 08/22/2020  . OAB (overactive bladder) 08/22/2020  . Acid reflux 04/06/2018   Past Medical History:  Diagnosis Date  . Arthritis   . Cancer ([place])   . History of chicken pox   . History of colon polyps   . History of mouth cancer   . Osteoarthritis of knee    Medial compartment, Right    History reviewed. No pertinent family history.  Past Surgical History:  Procedure Laterality Date  . APPENDECTOMY    . DILATION AND CURETTAGE OF UTERUS    . 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 KNEE ARTHROSCOPY W/ MENISCAL REPAIR Left   . PARTIAL KNEE ARTHROPLASTY Right 09/16/2017  . PARTIAL KNEE  ARTHROPLASTY Right 09/16/2017   Procedure: RIGHT UNICOMPARTMENTAL KNEE;  Surgeon: Mcarthur Rossetti, MD;  Location: [facility];  Service: Orthopedics;  Laterality: Right;  . SHOULDER ARTHROSCOPY Left 11/2018  . TOTAL KNEE REVISION Right 06/15/2019   Procedure: RIGHT UNICOMPARTMENTAL ARTHROPLASTY TO TOTAL KNEE ARTHROPLASTY;  Surgeon: Mcarthur Rossetti, MD;  Location: [facility];  Service: Orthopedics;  Laterality: Right;  . TUBAL LIGATION     Social History   Occupational History  . Not on file  Tobacco Use  . Smoking status: Former Smoker    Quit date: 12/28/1986    Years since quitting: 33.8  . Smokeless tobacco: Never Used  Vaping Use  . Vaping Use: Never used  Substance and Sexual Activity  . Alcohol use: No  . Drug use: No  . Sexual activity: Not Currently

## 2020-11-09 NOTE — Telephone Encounter (Signed)
Message sent to bone stimulator rep to check on status of this

## 2020-11-09 NOTE — Telephone Encounter (Signed)
Pt called back stating she is looking for the bone stimulator; she states after the first two initial calls to verify her as the pt she never heard anything back. Pt would like a CB in regards to this please  [phone]

## 2020-11-14 DIAGNOSIS — N3941 Urge incontinence: Secondary | ICD-10-CM | POA: Diagnosis not present

## 2020-11-16 ENCOUNTER — Telehealth: Payer: Self-pay | Admitting: Physician Assistant

## 2020-11-16 NOTE — Telephone Encounter (Signed)
Patient aware this takes a process and when this is approved then they will call her with the information and bone stim

## 2020-11-16 NOTE — Telephone Encounter (Signed)
Patient called requesting a call back from Renato Gails.. Patient has some concerns about a referral. Please call patient at [phone] or [phone].

## 2020-11-22 DIAGNOSIS — S8262XK Displaced fracture of lateral malleolus of left fibula, subsequent encounter for closed fracture with nonunion: Secondary | ICD-10-CM | POA: Diagnosis not present

## 2020-12-11 ENCOUNTER — Ambulatory Visit: Payer: PPO | Admitting: Physician Assistant

## 2020-12-11 ENCOUNTER — Ambulatory Visit (INDEPENDENT_AMBULATORY_CARE_PROVIDER_SITE_OTHER): Payer: PPO

## 2020-12-11 ENCOUNTER — Encounter: Payer: Self-pay | Admitting: Physician Assistant

## 2020-12-11 DIAGNOSIS — S8262XK Displaced fracture of lateral malleolus of left fibula, subsequent encounter for closed fracture with nonunion: Secondary | ICD-10-CM

## 2020-12-11 DIAGNOSIS — M7651 Patellar tendinitis, right knee: Secondary | ICD-10-CM | POA: Diagnosis not present

## 2020-12-11 NOTE — Addendum Note (Signed)
Addended by: Elvin So L on: 12/11/2020 11:01 AM   Modules accepted: Orders

## 2020-12-11 NOTE — Progress Notes (Signed)
Office Visit Note   Patient: [name]           Date of Birth: 1941/03/06           MRN: [id number] Visit Date: 12/11/2020              Requested by: Jearld Fenton, NP [address] [place],  [place] [address] PCP: Jearld Fenton, NP   Assessment & Plan: Visit Diagnoses:  1. 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 Patellar tendinitis of right knee     Plan: We will have her continue to use the bone stimulator for the next month.  Like to see her back in a month at that point time we will obtain 3 views of the right ankle.  In regards to her knee arthritis send her to physical therapy to work on range of motion strengthening.  Also iontophoresis over the patellar tibial tendon.  We will reevaluate her knee at follow-up.  She will continue use of Voltaren gel over the patella tibial tendon right knee.  Follow-Up Instructions: Return in about 4 weeks (around 01/08/2021).   Orders:  Orders Placed This Encounter  Procedures  . XR Ankle Complete Left   No orders of the defined types were placed in this encounter.     Procedures: No procedures performed   Clinical Data: No additional findings.   Subjective: Chief Complaint  Patient presents with  . Left Ankle - Follow-up, Fracture, Pain  . Right Knee - Follow-up    HPI [name] returns today follow-up of her right ankle lateral malleolus fracture.  She has been using a bone stimulator.  She states she still has pain lateral aspect ankle.  She is also complaining of her right knee pain.  She has a history of right total knee arthroplasty.  No known injury.  States she is having pain mainly in the anterior aspect of the knee points over the patella tibial tendon. Review of Systems See HPI.  Objective: Vital Signs: LMP  (LMP Unknown)   Physical Exam Constitutional:      Appearance: She is not ill-appearing or diaphoretic.  Pulmonary:     Effort: Pulmonary effort  is normal.  Neurological:     Mental Status: She is alert and oriented to person, place, and time.  Psychiatric:        Mood and Affect: Mood normal.     Ortho Exam Left ankle full dorsiflexion plantarflexion 5-5 strength with inversion eversion of the left foot against resistance.  She has minimal tenderness over the lateral malleolus.  The remainder the ankle is nontender.  There is no rashes skin lesions, ulcerations or erythema. Left knee no abnormal warmth erythema or effusion.  Surgical incisions well-healed.  No instability valgus varus stressing.  Anterior drawer is negative.  She will do straight leg raise has good range of motion left knee.  Tenderness over the patellar tibial tendon.  Specialty Comments:  No specialty comments available.  Imaging: XR Ankle Complete Left  Result Date: 12/11/2020 3 views left ankle: Talus well located within the ankle mortise.  There is been further consolidation of the lateral malleolus fracture.  The fracture site is barely evident at this point.  No other fractures or bony abnormalities    PMFS History: Patient Active Problem List   Diagnosis Date Noted  . Primary osteoarthritis involving multiple joints 08/22/2020  . OAB (overactive bladder) 08/22/2020  . Acid  reflux 04/06/2018   Past Medical History:  Diagnosis Date  . Arthritis   . Cancer ([place])   . History of chicken pox   . History of colon polyps   . History of mouth cancer   . Osteoarthritis of knee    Medial compartment, Right    History reviewed. No pertinent family history.  Past Surgical History:  Procedure Laterality Date  . APPENDECTOMY    . DILATION AND CURETTAGE OF UTERUS    . 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 KNEE ARTHROSCOPY W/ MENISCAL REPAIR Left   . PARTIAL KNEE ARTHROPLASTY Right 09/16/2017  . PARTIAL KNEE ARTHROPLASTY Right 09/16/2017   Procedure: RIGHT UNICOMPARTMENTAL KNEE;  Surgeon: Mcarthur Rossetti, MD;  Location: [facility];  Service:  Orthopedics;  Laterality: Right;  . SHOULDER ARTHROSCOPY Left 11/2018  . TOTAL KNEE REVISION Right 06/15/2019   Procedure: RIGHT UNICOMPARTMENTAL ARTHROPLASTY TO TOTAL KNEE ARTHROPLASTY;  Surgeon: Mcarthur Rossetti, MD;  Location: [facility];  Service: Orthopedics;  Laterality: Right;  . TUBAL LIGATION     Social History   Occupational History  . Not on file  Tobacco Use  . Smoking status: Former Smoker    Quit date: 12/28/1986    Years since quitting: 33.9  . Smokeless tobacco: Never Used  Vaping Use  . Vaping Use: Never used  Substance and Sexual Activity  . Alcohol use: No  . Drug use: No  . Sexual activity: Not Currently                                                                                                                                                                                                                                                            + +

## 2020-12-12 DIAGNOSIS — N3941 Urge incontinence: Secondary | ICD-10-CM | POA: Diagnosis not present

## 2020-12-15 ENCOUNTER — Encounter: Payer: Self-pay | Admitting: Physical Therapy

## 2020-12-15 ENCOUNTER — Other Ambulatory Visit: Payer: Self-pay

## 2020-12-15 ENCOUNTER — Ambulatory Visit: Payer: PPO | Admitting: Physical Therapy

## 2020-12-15 DIAGNOSIS — R2689 Other abnormalities of gait and mobility: Secondary | ICD-10-CM | POA: Diagnosis not present

## 2020-12-15 DIAGNOSIS — M25561 Pain in right knee: Secondary | ICD-10-CM | POA: Diagnosis not present

## 2020-12-15 NOTE — Patient Instructions (Signed)
Access Code: [email or web address] URL: [email or web address] Date: 12/15/2020 Prepared by: Faustino Congress  Exercises Seated Straight Leg Raise - 1 x daily - 7 x weekly - 2 sets - 10 reps Seated Long Arc Quad with Ankle Weight - 1 x daily - 7 x weekly - 2 sets - 10 reps  Patient Education [place] Patient Instructions

## 2020-12-15 NOTE — Therapy (Signed)
[facility] [place], [place], [address] Phone: [phone]   Fax:  [phone]  Physical Therapy Evaluation  Patient Details  Name: [name] MRN: [id number] Date of Birth: 1941/02/15 Referring Provider (PT): Pete Pelt, [place]   Encounter Date: 12/15/2020   PT End of Session - 12/15/20 1102    Visit Number 1    Number of Visits 6    Date for PT Re-Evaluation 01/26/21    Authorization Type [employer]    Progress Note Due on Visit 10    PT Start Time 2353    PT Stop Time 1051    PT Time Calculation (min) 36 min    Activity Tolerance Patient tolerated treatment well    Behavior During Therapy [facility] for tasks assessed/performed           Past Medical History:  Diagnosis Date  . Arthritis   . Cancer ([place])   . History of chicken pox   . History of colon polyps   . History of mouth cancer   . Osteoarthritis of knee    Medial compartment, Right    Past Surgical History:  Procedure Laterality Date  . APPENDECTOMY    . DILATION AND CURETTAGE OF UTERUS    . 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 KNEE ARTHROSCOPY W/ MENISCAL REPAIR Left   . PARTIAL KNEE ARTHROPLASTY Right 09/16/2017  . PARTIAL KNEE ARTHROPLASTY Right 09/16/2017   Procedure: RIGHT UNICOMPARTMENTAL KNEE;  Surgeon: Mcarthur Rossetti, MD;  Location: [facility];  Service: Orthopedics;  Laterality: Right;  . SHOULDER ARTHROSCOPY Left 11/2018  . TOTAL KNEE REVISION Right 06/15/2019   Procedure: RIGHT UNICOMPARTMENTAL ARTHROPLASTY TO TOTAL KNEE ARTHROPLASTY;  Surgeon: Mcarthur Rossetti, MD;  Location: [facility];  Service: Orthopedics;  Laterality: Right;  . TUBAL LIGATION      There were no vitals filed for this visit.    Subjective Assessment - 12/15/20 1018    Subjective Pt is a 80 y/o female who presents to OPPT with Rt knee pain with hx of Rt TKA.  She has recent Lt fibular fx with non union in Oct 2021, and Rt knee pain has progressively  gotten worse since injury (was present prior).    Pertinent History OA, Lt fibula fx with non union    Limitations Walking    How long can you stand comfortably? 30-45 min    How long can you walk comfortably? 30-45 min in the yard    Patient Stated Goals walk in yard/garden with little to no pain    Currently in Pain? Yes    Pain Score 0-No pain   up to 7/10   Pain Location Knee    Pain Orientation Right    Pain Descriptors / Indicators Throbbing    Pain Type Acute pain;Chronic pain    Pain Onset More than a month ago    Pain Frequency Intermittent    Aggravating Factors  standing, walking    Pain Relieving Factors rest, tylenol    Effect of Pain on Daily Activities avoids stair              [facility] PT Assessment - 12/15/20 1023      Assessment   Medical Diagnosis M76.51 (ICD-10-CM) - Patellar tendinitis of right knee    Referring Provider (PT) Pete Pelt, PA-C    Onset Date/Surgical Date --   6-8 months ago   Hand Dominance Right    Next MD Visit 01/08/21  Prior Therapy multiple times; most recent Sept 2020      Precautions   Precautions Fall    Precaution Comments 1      Restrictions   Weight Bearing Restrictions No      Balance Screen   Has the patient fallen in the past 6 months Yes    How many times? 1    Has the patient had a decrease in activity level because of a fear of falling?  No    Is the patient reluctant to leave their home because of a fear of falling?  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    Leisure crochet, knitting, sewing, gardening; no regular exercise  currently (has stationary bike and walking)      Cognition   Overall Cognitive Status Within Functional Limits for tasks assessed      Observation/Other Assessments   Focus on Therapeutic Outcomes (FOTO)  63 (predicted 70)      ROM / Strength   AROM / PROM / Strength AROM;Strength      AROM   Overall AROM Comments Rt knee WNL, mild pain at end range    AROM Assessment Site Knee    Right/Left Knee --      Strength   Strength Assessment Site Knee    Right/Left Knee Right    Right Knee Flexion 4/5    Right Knee Extension 5/5      Palpation   Palpation comment mild swelling present ant Rt knee; very tender to palpation anterior lateral joint line      Ambulation/Gait   Gait Pattern Antalgic;Decreased hip/knee flexion - right                      Objective measurements completed on examination: See above findings.       [facility] Adult PT Treatment/Exercise - 12/15/20 1124      Exercises   Exercises Other Exercises    Other Exercises  see pt instructions - pt performed 2-5 reps of each exercise      Modalities   Modalities Iontophoresis      Iontophoresis   Type of Iontophoresis Dexamethasone    Location Rt ant lateral knee    Dose 1 cc    Time 4 hour patch                  PT Education - 12/15/20 1102    Education Details HEP, ionto    Person(s) Educated Patient    Methods Explanation;Demonstration;Handout    Comprehension Verbalized understanding;Returned demonstration;Need further instruction            PT Short Term Goals - 12/15/20 1154      PT SHORT TERM GOAL #1   Title independent with initial HEP    Status New    Target Date 01/05/21             PT Long Term Goals - 12/15/20 1154      PT LONG TERM GOAL #1  Title independent with final HEP    Status New    Target Date 01/26/21      PT LONG TERM GOAL #2   Title report pain < 4/10 with walking > 45 min for improved function    Status New    Target Date 01/26/21       PT LONG TERM GOAL #3   Title demonstrate 5/5 knee ext strength for improved mobility and activity tolerance    Status New    Target Date 01/26/21                  Plan - 12/15/20 1125    Clinical Impression Statement Pt is a 80 y/o female who presents to OPPT for Rt knee pain exacerbated by Lt fibula fx with non union.  Pt demonstrates mild strength deficits and some swelling as well as gait abnormalities affecting functional mobility.  Pt will benefit from PT to address deficits listed.    Personal Factors and Comorbidities Comorbidity 2    Comorbidities OA, Lt fibula fx with non union    Examination-Activity Limitations Squat;Stairs;Stand;Locomotion Level    Examination-Participation Restrictions Community Activity;Yard Work;Laundry;Meal Prep    Stability/Clinical Decision Making Evolving/Moderate complexity    Clinical Decision Making Moderate    Rehab Potential Good    PT Frequency 1x / week    PT Duration 6 weeks    PT Treatment/Interventions ADLs/Self Care Home Management;Cryotherapy;Electrical Stimulation;Iontophoresis 4mg /ml Dexamethasone;Moist Heat;Therapeutic exercise;Balance training;Therapeutic activities;Functional mobility training;Stair training;Ultrasound;Neuromuscular re-education;Patient/family education;Manual techniques;Vasopneumatic Device;Taping;Dry needling;Passive range of motion    PT Next Visit Plan review HEP, assess response to ionto and repeat #2    PT Home Exercise Plan Access Code: [email or web address]    Consulted and Agree with Plan of Care Patient           Patient will benefit from skilled therapeutic intervention in order to improve the following deficits and impairments:  Abnormal gait,Pain,Decreased strength,Difficulty walking,Decreased mobility,Decreased range of motion,Decreased balance  Visit Diagnosis: Acute pain of right knee - Plan: PT plan of care cert/re-cert  Other abnormalities of gait and mobility - Plan: PT plan of care  cert/re-cert     Problem List Patient Active Problem List   Diagnosis Date Noted  . Primary osteoarthritis involving multiple joints 08/22/2020  . OAB (overactive bladder) 08/22/2020  . Acid reflux 04/06/2018      Laureen Abrahams, PT, DPT 12/15/20 11:58 AM    [facility] Physical Therapy [address] [place], [place], [address] Phone: [phone]   Fax:  [phone]  Name: [name] MRN: [id number] Date of Birth: [birth year]

## 2020-12-22 ENCOUNTER — Ambulatory Visit: Payer: PPO | Admitting: Rehabilitative and Restorative Service Providers"

## 2020-12-22 ENCOUNTER — Other Ambulatory Visit: Payer: Self-pay

## 2020-12-22 ENCOUNTER — Encounter: Payer: Self-pay | Admitting: Rehabilitative and Restorative Service Providers"

## 2020-12-22 DIAGNOSIS — M25561 Pain in right knee: Secondary | ICD-10-CM | POA: Diagnosis not present

## 2020-12-22 DIAGNOSIS — R2689 Other abnormalities of gait and mobility: Secondary | ICD-10-CM

## 2020-12-22 NOTE — Therapy (Signed)
[place], [place], [address] Phone: [phone]   Fax:  [phone]  Physical Therapy Treatment  Patient Details  Name: [name] MRN: [id number] Date of Birth: Nov 13, 1940 Referring Provider (PT): Pete Pelt, [place]   Encounter Date: 12/22/2020   PT End of Session - 12/22/20 1016    Visit Number 2    Number of Visits 6    Date for PT Re-Evaluation 01/26/21    Authorization Type [employer]    Progress Note Due on Visit 10    PT Start Time 1013    PT Stop Time 1052    PT Time Calculation (min) 39 min    Activity Tolerance Patient tolerated treatment well    Behavior During Therapy [facility] for tasks assessed/performed           Past Medical History:  Diagnosis Date  . Arthritis   . Cancer ([place])   . History of chicken pox   . History of colon polyps   . History of mouth cancer   . Osteoarthritis of knee    Medial compartment, Right    Past Surgical History:  Procedure Laterality Date  . APPENDECTOMY    . DILATION AND CURETTAGE OF UTERUS    . 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 KNEE ARTHROSCOPY W/ MENISCAL REPAIR Left   . PARTIAL KNEE ARTHROPLASTY Right 09/16/2017  . PARTIAL KNEE ARTHROPLASTY Right 09/16/2017   Procedure: RIGHT UNICOMPARTMENTAL KNEE;  Surgeon: Mcarthur Rossetti, MD;  Location: [place];  Service: Orthopedics;  Laterality: Right;  . SHOULDER ARTHROSCOPY Left 11/2018  . TOTAL KNEE REVISION Right 06/15/2019   Procedure: RIGHT UNICOMPARTMENTAL ARTHROPLASTY TO TOTAL KNEE ARTHROPLASTY;  Surgeon: Mcarthur Rossetti, MD;  Location: [place];  Service: Orthopedics;  Laterality: Right;  . TUBAL LIGATION      There were no vitals filed for this visit.   Subjective Assessment - 12/22/20 1015    Subjective Pt. indicated no adverse reactions to patch and HEP use.  Pt. stated maybe a little bit of improvement in walking pain complaints but still noted.  No pain upon arrival today.     Pertinent History OA, Lt fibula fx with non union    Limitations Walking    How long can you stand comfortably? 30-45 min    How long can you walk comfortably? 30-45 min in the yard    Patient Stated Goals walk in yard/garden with little to no pain    Currently in Pain? No/denies    Pain Orientation Right    Pain Onset More than a month ago    Pain Frequency Intermittent    Aggravating Factors  walking                             [facility] Adult PT Treatment/Exercise - 12/22/20 0001      Exercises   Exercises Knee/Hip    Other Exercises  Verbal review of existing HEP      Knee/Hip Exercises: Stretches   Gastroc Stretch 30 seconds;Right;3 reps   runner stretch on incline board     Knee/Hip Exercises: Aerobic   Nustep Lvl 5 6 mins UE/LE      Knee/Hip Exercises: Machines for Strengthening   Cybex Knee Extension eccentric LAQ Rt LE 3 x 10 15 lbs 2-3 second lower    Cybex Knee Flexion SL curl 3 x 10 20 lbs  Knee/Hip Exercises: Seated   Sit to Sand without UE support;10 reps   eccentric lowering focus     Iontophoresis   Type of Iontophoresis Dexamethasone    Location Rt ant lateral knee    Dose 1 cc    Time 4 hour patch                    PT Short Term Goals - 12/15/20 1154      PT SHORT TERM GOAL #1   Title independent with initial HEP    Status New    Target Date 01/05/21             PT Long Term Goals - 12/15/20 1154      PT LONG TERM GOAL #1   Title independent with final HEP    Status New    Target Date 01/26/21      PT LONG TERM GOAL #2   Title report pain < 4/10 with walking > 45 min for improved function    Status New    Target Date 01/26/21      PT LONG TERM GOAL #3   Title demonstrate 5/5 knee ext strength for improved mobility and activity tolerance    Status New    Target Date 01/26/21                 Plan - 12/22/20 1029    Clinical Impression Statement Able to tolerance eccentric lowering focus well  and no adverse reactions to ionto use (reapplied today).  Continued strengthening indicated c progression to include functional WB control.    Personal Factors and Comorbidities Comorbidity 2    Comorbidities OA, Lt fibula fx with non union    Examination-Activity Limitations Squat;Stairs;Stand;Locomotion Level    Examination-Participation Restrictions Community Activity;Yard Work;Laundry;Meal Prep    Stability/Clinical Decision Making Evolving/Moderate complexity    Rehab Potential Good    PT Frequency 1x / week    PT Duration 6 weeks    PT Treatment/Interventions ADLs/Self Care Home Management;Cryotherapy;Electrical Stimulation;Iontophoresis 4mg /ml Dexamethasone;Moist Heat;Therapeutic exercise;Balance training;Therapeutic activities;Functional mobility training;Stair training;Ultrasound;Neuromuscular re-education;Patient/family education;Manual techniques;Vasopneumatic Device;Taping;Dry needling;Passive range of motion    PT Next Visit Plan Continue ionto as beneficial, eccentric loading for quad in NWB, WB as tolerated, balance intervention compliant and non compliant surfaces.    PT Home Exercise Plan Access Code: [email or web address]    Consulted and Agree with Plan of Care Patient           Patient will benefit from skilled therapeutic intervention in order to improve the following deficits and impairments:  Abnormal gait,Pain,Decreased strength,Difficulty walking,Decreased mobility,Decreased range of motion,Decreased balance  Visit Diagnosis: Acute pain of right knee  Other abnormalities of gait and mobility     Problem List Patient Active Problem List   Diagnosis Date Noted  . Primary osteoarthritis involving multiple joints 08/22/2020  . OAB (overactive bladder) 08/22/2020  . Acid reflux 04/06/2018    Scot Jun, PT, DPT, OCS, ATC 12/22/20  10:55 AM    [facility] [address] [place], [place], [address] Phone: [phone]   Fax:   [phone]  Name: [name] MRN: [id number] Date of Birth: 1940-12-31

## 2020-12-28 ENCOUNTER — Ambulatory Visit: Payer: PPO | Admitting: Physical Therapy

## 2020-12-28 ENCOUNTER — Encounter: Payer: Self-pay | Admitting: Physical Therapy

## 2020-12-28 ENCOUNTER — Other Ambulatory Visit: Payer: Self-pay

## 2020-12-28 DIAGNOSIS — R2689 Other abnormalities of gait and mobility: Secondary | ICD-10-CM | POA: Diagnosis not present

## 2020-12-28 DIAGNOSIS — M25561 Pain in right knee: Secondary | ICD-10-CM | POA: Diagnosis not present

## 2020-12-28 NOTE — Therapy (Signed)
[place], [place], [address] Phone: [phone]   Fax:  [phone]  Physical Therapy Treatment  Patient Details  Name: [name] MRN: [id number] Date of Birth: [birth year] Referring Provider (PT): Pete Pelt, [place]   Encounter Date: 12/28/2020   PT End of Session - 12/28/20 0845    Visit Number 3    Number of Visits 6    Date for PT Re-Evaluation 01/26/21    Authorization Type [employer]    PT Start Time 0845    PT Stop Time 0933    PT Time Calculation (min) 48 min    Activity Tolerance Patient tolerated treatment well;Patient limited by pain    Behavior During Therapy [facility] for tasks assessed/performed           Past Medical History:  Diagnosis Date  . Arthritis   . Cancer ([place])   . History of chicken pox   . History of colon polyps   . History of mouth cancer   . Osteoarthritis of knee    Medial compartment, Right    Past Surgical History:  Procedure Laterality Date  . APPENDECTOMY    . DILATION AND CURETTAGE OF UTERUS    . 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 KNEE ARTHROSCOPY W/ MENISCAL REPAIR Left   . PARTIAL KNEE ARTHROPLASTY Right 09/16/2017  . PARTIAL KNEE ARTHROPLASTY Right 09/16/2017   Procedure: RIGHT UNICOMPARTMENTAL KNEE;  Surgeon: Mcarthur Rossetti, MD;  Location: [place];  Service: Orthopedics;  Laterality: Right;  . SHOULDER ARTHROSCOPY Left 11/2018  . TOTAL KNEE REVISION Right 06/15/2019   Procedure: RIGHT UNICOMPARTMENTAL ARTHROPLASTY TO TOTAL KNEE ARTHROPLASTY;  Surgeon: Mcarthur Rossetti, MD;  Location: [place];  Service: Orthopedics;  Laterality: Right;  . TUBAL LIGATION      There were no vitals filed for this visit.   Subjective Assessment - 12/28/20 0848    Subjective Pt reporting right ant lateral knee pain. No significant change with ionto #2. The more I walk the more it hurts.    Pertinent History OA, Lt fibula fx with non union    Patient Stated  Goals walk in yard/garden with little to no pain    Currently in Pain? Yes    Pain Score 4     Pain Location Knee    Pain Orientation Right    Pain Descriptors / Indicators Throbbing    Pain Type Acute pain                             [facility] Adult PT Treatment/Exercise - 12/28/20 0001      Knee/Hip Exercises: Stretches   Gastroc Stretch Right;Left;Both;20 seconds    Soleus Stretch Right;2 reps;20 seconds      Knee/Hip Exercises: Aerobic   Stationary Bike L2 x 5 seat 4      Knee/Hip Exercises: Machines for Strengthening   Cybex Knee Extension unable to tolerate today; pain at 15#, 10# and 5#   pain increased to 7/10   Cybex Knee Flexion Bil 20# 2x15; unable to tolerate SL curl      Knee/Hip Exercises: Seated   Long Arc Quad Right;2 sets;10 reps    Long Arc Quad Weight 5 lbs.    Long [employer] Limitations no pain reported    Sit to [employer] without UE support;10 reps   eccentric lowering focus; cues to avoid rocking to stand     Knee/Hip Exercises:  Supine   Straight Leg Raises Right;15 reps    Straight Leg Raises Limitations 2.5#      Knee/Hip Exercises: Prone   Hamstring Curl 2 sets;10 reps    Hamstring Curl Limitations 5# eccentric lowering; no pain reported      Iontophoresis   Type of Iontophoresis Dexamethasone    Location Rt ant lateral knee    Dose 1 cc    Time 4 hour patch                  PT Education - 12/28/20 0937    Education Details HEP    Person(s) Educated Patient    Methods Explanation;Demonstration;Handout    Comprehension Verbalized understanding;Returned demonstration            PT Short Term Goals - 12/15/20 1154      PT SHORT TERM GOAL #1   Title independent with initial HEP    Status New    Target Date 01/05/21             PT Long Term Goals - 12/15/20 1154      PT LONG TERM GOAL #1   Title independent with final HEP    Status New    Target Date 01/26/21      PT LONG TERM GOAL #2   Title report pain <  4/10 with walking > 45 min for improved function    Status New    Target Date 01/26/21      PT LONG TERM GOAL #3   Title demonstrate 5/5 knee ext strength for improved mobility and activity tolerance    Status New    Target Date 01/26/21                 Plan - 12/28/20 0938    Clinical Impression Statement Patient experienced increased pain with eccentric quad strengthening on machine when done at previous level and even lower weight. Pain increased from 4 to 7/10. She also had pain with SL HS curl on machine as well. She was able to tolerate seated LAQ and prone HS curls with 5# weight with no pain reported, but still challenging. No relief yet from ionto. Patch #3 applied to right knee near medial joint line where patient was very point tender. Pain contiues to increase the further she walks.  LTGs are ongoing.    Comorbidities OA, Lt fibula fx with non union    Examination-Activity Limitations Squat;Stairs;Stand;Locomotion Level    Examination-Participation Restrictions Community Activity;Yard Work;Laundry;Meal Prep    PT Frequency 1x / week    PT Duration 6 weeks    PT Treatment/Interventions ADLs/Self Care Home Management;Cryotherapy;Electrical Stimulation;Iontophoresis 4mg /ml Dexamethasone;Moist Heat;Therapeutic exercise;Balance training;Therapeutic activities;Functional mobility training;Stair training;Ultrasound;Neuromuscular re-education;Patient/family education;Manual techniques;Vasopneumatic Device;Taping;Dry needling;Passive range of motion    PT Next Visit Plan Continue ionto as beneficial, eccentric loading for quad in NWB, WB as tolerated, balance intervention compliant and non compliant surfaces.    PT Home Exercise Plan Access Code: [email or web address]    Consulted and Agree with Plan of Care Patient           Patient will benefit from skilled therapeutic intervention in order to improve the following deficits and impairments:  Abnormal gait,Pain,Decreased strength,Difficulty  walking,Decreased mobility,Decreased range of motion,Decreased balance  Visit Diagnosis: Acute pain of right knee  Other abnormalities of gait and mobility     Problem List Patient Active Problem List   Diagnosis Date Noted  . Primary osteoarthritis involving multiple joints 08/22/2020  . OAB (overactive  bladder) 08/22/2020  . Acid reflux 04/06/2018    Madelyn Flavors PT 12/28/2020, 9:43 AM  [facility] [address] [place], [place], [address] Phone: [phone]   Fax:  [phone]  Name: [name] MRN: [id number] Date of Birth: June 27, 1941

## 2020-12-28 NOTE — Patient Instructions (Signed)
Access Code: [email or web address] URL: [email or web address] Date: 12/28/2020 Prepared by: Almyra Free  Exercises Seated Straight Leg Raise - 1 x daily - 7 x weekly - 2 sets - 10 reps Seated Long Arc Quad with Ankle Weight - 1 x daily - 7 x weekly - 2 sets - 10 reps Prone Hamstring Curl with Ankle Weight - 1 x daily - 7 x weekly - 3 sets - 10 reps  Patient Education [place] Patient Instructions

## 2021-01-01 ENCOUNTER — Telehealth: Payer: Self-pay | Admitting: Internal Medicine

## 2021-01-01 ENCOUNTER — Other Ambulatory Visit: Payer: PPO

## 2021-01-01 NOTE — Telephone Encounter (Signed)
Their fax # is [phone]

## 2021-01-01 NOTE — Telephone Encounter (Signed)
Tracey from [facility] called in stating that the patient is wanting a bone density done. They are needing a order. EM

## 2021-01-02 ENCOUNTER — Other Ambulatory Visit: Payer: Self-pay | Admitting: Internal Medicine

## 2021-01-02 DIAGNOSIS — Z780 Asymptomatic menopausal state: Secondary | ICD-10-CM

## 2021-01-02 NOTE — Telephone Encounter (Signed)
Order faxed as requested

## 2021-01-02 NOTE — Telephone Encounter (Signed)
ordered

## 2021-01-03 DIAGNOSIS — M8588 Other specified disorders of bone density and structure, other site: Secondary | ICD-10-CM | POA: Diagnosis not present

## 2021-01-03 LAB — HM DEXA SCAN

## 2021-01-04 ENCOUNTER — Encounter: Payer: PPO | Admitting: Physical Therapy

## 2021-01-08 ENCOUNTER — Ambulatory Visit (INDEPENDENT_AMBULATORY_CARE_PROVIDER_SITE_OTHER): Payer: PPO

## 2021-01-08 ENCOUNTER — Encounter: Payer: Self-pay | Admitting: Physician Assistant

## 2021-01-08 ENCOUNTER — Other Ambulatory Visit: Payer: Self-pay

## 2021-01-08 ENCOUNTER — Ambulatory Visit (INDEPENDENT_AMBULATORY_CARE_PROVIDER_SITE_OTHER): Payer: PPO | Admitting: Physician Assistant

## 2021-01-08 DIAGNOSIS — M545 Low back pain, unspecified: Secondary | ICD-10-CM

## 2021-01-08 DIAGNOSIS — S8262XK Displaced fracture of lateral malleolus of left fibula, subsequent encounter for closed fracture with nonunion: Secondary | ICD-10-CM

## 2021-01-08 MED ORDER — METHYLPREDNISOLONE 4 MG PO TABS: ORAL_TABLET | ORAL | 0 refills | Status: DC

## 2021-01-08 MED ORDER — METHOCARBAMOL 500 MG PO TABS: 500.0000 mg | ORAL_TABLET | Freq: Every day | ORAL | 1 refills | Status: DC

## 2021-01-08 NOTE — Progress Notes (Signed)
Office Visit Note   Patient: [name]           Date of Birth: May 14, 1941           MRN: [id number] Visit Date: 01/08/2021              Requested by: Jearld Fenton, NP [address] [place],  [place] [address] PCP: Jearld Fenton, NP   Assessment & Plan: Visit Diagnoses:  1. Left-sided low back pain without sciatica, unspecified chronicity   2. 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  We will place her on Medrol Dosepak for the knee pain and the low back pain.  Also Robaxin is given to take at night.  Encouraged her to continue the exercises as taught by therapy for her right knee.  She is given handouts for back exercises.  Back exercises are reviewed.  Follow-Up Instructions: Return in about 6 weeks (around 02/19/2021).   Orders:  Orders Placed This Encounter  Procedures  . XR Ankle Complete Left  . XR Lumbar Spine 2-3 Views   No orders of the defined types were placed in this encounter.     Procedures: No procedures performed   Clinical Data: No additional findings.   Subjective: Chief Complaint  Patient presents with  . Left Ankle - Pain  . Right Knee - Pain  . Lower Back - Pain    HPI [name] comes in today for follow-up of her left ankle lateral malleolus fracture.  She has been using a bone stimulator.  She states she is no longer having any left ankle pain.  However she has developed pain in her low back on the left side over the last 2 or 3 days.  No known injury.  She continues to have right knee pain.  She denies any bowel or bladder changes or saddle anesthesia like symptoms.  She does state low back pain does awaken her at night if she turns the wrong way.  Pain is worse with bending or getting up from a seated position.  Denies any numbness tingling down either leg.  She states that physical  therapy is actually made her pain worse in regards to the right knee.  She had no acute injury to the knee.  She is nondiabetic.  Review of Systems  Negative for fevers chills.  Objective: Vital Signs: LMP  (LMP Unknown)   Physical Exam Constitutional:      Appearance: She is not ill-appearing or diaphoretic.  Neurological:     Mental Status: She is alert and oriented to person, place, and time.  Psychiatric:        Mood and Affect: Mood normal.     Ortho Exam Left ankle: Full range of motion left ankle without pain.  Nontender over the lateral malleolus.  Slight tenderness over the medial malleolus.  No tenderness over the Achilles.  No pain with inversion eversion left ankle.  Left foot dorsal pedal pulse 2+. Lower extremities 5 5 strength throughout lower extremities against resistance.  Positive straight leg raise on the left.  Tight hamstrings bilaterally.  Tenderness over the left lower lumbar paraspinous region only. Right knee full range of motion without pain.  Tenderness over the pes anserinus region and over the lateral joint line.   Specialty Comments:  No specialty comments available.  Imaging: XR Ankle Complete Left  Result Date: 01/08/2021 Left ankle 3 views: Talus well located within the ankle mortise no diastases.  Lateral malleolus fracture is completely healed at this point time.  No acute fractures or bony abnormalities.  XR Lumbar Spine 2-3 Views  Result Date: 01/08/2021 Lumbar spine 2 views: The space overall well-maintained throughout.  Minimal degenerative endplate spurring.  No spondylolisthesis.  Arthrosclerosis of aorta noted.    PMFS History: Patient Active Problem List   Diagnosis Date Noted  . Primary osteoarthritis involving multiple joints 08/22/2020  . OAB (overactive bladder) 08/22/2020  . Acid reflux 04/06/2018   Past Medical History:  Diagnosis Date  . Arthritis   . Cancer ([place])   . History of chicken pox   . History of colon polyps    . History of mouth cancer   . Osteoarthritis of knee    Medial compartment, Right    History reviewed. No pertinent family history.  Past Surgical History:  Procedure Laterality Date  . APPENDECTOMY    . DILATION AND CURETTAGE OF UTERUS    . 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 KNEE ARTHROSCOPY W/ MENISCAL REPAIR Left   . PARTIAL KNEE ARTHROPLASTY Right 09/16/2017  . PARTIAL KNEE ARTHROPLASTY Right 09/16/2017   Procedure: RIGHT UNICOMPARTMENTAL KNEE;  Surgeon: Mcarthur Rossetti, MD;  Location: [place];  Service: Orthopedics;  Laterality: Right;  . SHOULDER ARTHROSCOPY Left 11/2018  . TOTAL KNEE REVISION Right 06/15/2019   Procedure: RIGHT UNICOMPARTMENTAL ARTHROPLASTY TO TOTAL KNEE ARTHROPLASTY;  Surgeon: Mcarthur Rossetti, MD;  Location: [place];  Service: Orthopedics;  Laterality: Right;  . TUBAL LIGATION     Social History   Occupational History  . Not on file  Tobacco Use  . Smoking status: Former Smoker    Quit date: 12/28/1986    Years since quitting: 34.0  . Smokeless tobacco: Never Used  Vaping Use  . Vaping Use: Never used  Substance and Sexual Activity  . Alcohol use: No  . Drug use: No  . Sexual activity: Not Currently

## 2021-01-09 DIAGNOSIS — N3941 Urge incontinence: Secondary | ICD-10-CM | POA: Diagnosis not present

## 2021-01-11 ENCOUNTER — Ambulatory Visit: Payer: PPO | Admitting: Rehabilitative and Restorative Service Providers"

## 2021-01-11 ENCOUNTER — Encounter: Payer: Self-pay | Admitting: Rehabilitative and Restorative Service Providers"

## 2021-01-11 ENCOUNTER — Other Ambulatory Visit: Payer: Self-pay

## 2021-01-11 DIAGNOSIS — R2689 Other abnormalities of gait and mobility: Secondary | ICD-10-CM

## 2021-01-11 DIAGNOSIS — M25561 Pain in right knee: Secondary | ICD-10-CM

## 2021-01-11 NOTE — Therapy (Addendum)
[place], [place], [address] Phone: [phone]   Fax:  [phone]  Physical Therapy Treatment/Discharge   Patient Details  Name: [name] MRN: [id number] Date of Birth: 23-Apr-1941 Referring Provider (PT): Pete Pelt, [place]   Encounter Date: 01/11/2021   PT End of Session - 01/11/21 0913    Visit Number 4    Number of Visits 6    Date for PT Re-Evaluation 01/26/21    Authorization Type [employer]    PT Start Time 0845    PT Stop Time 0924    PT Time Calculation (min) 39 min    Activity Tolerance Patient tolerated treatment well    Behavior During Therapy [facility] for tasks assessed/performed           Past Medical History:  Diagnosis Date  . Arthritis   . Cancer ([place])   . History of chicken pox   . History of colon polyps   . History of mouth cancer   . Osteoarthritis of knee    Medial compartment, Right    Past Surgical History:  Procedure Laterality Date  . APPENDECTOMY    . DILATION AND CURETTAGE OF UTERUS    . 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 KNEE ARTHROSCOPY W/ MENISCAL REPAIR Left   . PARTIAL KNEE ARTHROPLASTY Right 09/16/2017  . PARTIAL KNEE ARTHROPLASTY Right 09/16/2017   Procedure: RIGHT UNICOMPARTMENTAL KNEE;  Surgeon: Mcarthur Rossetti, MD;  Location: [place];  Service: Orthopedics;  Laterality: Right;  . SHOULDER ARTHROSCOPY Left 11/2018  . TOTAL KNEE REVISION Right 06/15/2019   Procedure: RIGHT UNICOMPARTMENTAL ARTHROPLASTY TO TOTAL KNEE ARTHROPLASTY;  Surgeon: Mcarthur Rossetti, MD;  Location: [place];  Service: Orthopedics;  Laterality: Right;  . TUBAL LIGATION      There were no vitals filed for this visit.   Subjective Assessment - 01/11/21 0900    Subjective Pt. indicated having back complaints in last few weeks insidious, rated about 4/10 or so today.  Saw MD c xray, given some medication.  Rt knee at 3/10 with walking in clinic today, no pain at rest.     Pertinent History OA, Lt fibula fx with non union    Patient Stated Goals walk in yard/garden with little to no pain    Currently in Pain? Yes    Pain Score 3     Pain Location Knee    Pain Orientation Right    Pain Type Acute pain    Pain Onset More than a month ago    Pain Frequency Intermittent    Aggravating Factors  walking    Pain Relieving Factors rest              [facility] PT Assessment - 01/11/21 0001      Assessment   Medical Diagnosis M76.51 (ICD-10-CM) - Patellar tendinitis of right knee    Referring Provider (PT) Pete Pelt, PA-C      Transfers   Comments 18 inch chair s UE assist s symptoms, good control                         [facility] Adult PT Treatment/Exercise - 01/11/21 0001      Exercises   Other Exercises  HEP review c printout      Knee/Hip Exercises: Machines for Strengthening   Cybex Knee Extension 10 lbs eccentric lowering Rt LE 3 x 10      Knee/Hip Exercises:  Standing   Lateral Step Up Step Height: 4"   2 x 10 eccentric lowering bilateral     Knee/Hip Exercises: Seated   Other Seated Knee/Hip Exercises seated SLR 2 x 10 eccentric focus bilateral    Sit to Sand without UE support   2 x 10 eccentric focus                   PT Short Term Goals - 01/11/21 0913      PT SHORT TERM GOAL #1   Title independent with initial HEP    Status Achieved    Target Date 01/05/21             PT Long Term Goals - 12/15/20 1154      PT LONG TERM GOAL #1   Title independent with final HEP    Status New    Target Date 01/26/21      PT LONG TERM GOAL #2   Title report pain < 4/10 with walking > 45 min for improved function    Status New    Target Date 01/26/21      PT LONG TERM GOAL #3   Title demonstrate 5/5 knee ext strength for improved mobility and activity tolerance    Status New    Target Date 01/26/21                 Plan - 01/11/21 0914    Clinical Impression Statement Pt. was able to return to  intervention that was held last time as symptoms were not present during intervention today.  Pt. stated overall not noticing improvement c use of ionto patch so held today.  Reviewed existing HEP and discussed back related exercises she was given from MD visit.    Comorbidities OA, Lt fibula fx with non union    Examination-Activity Limitations Squat;Stairs;Stand;Locomotion Level    Examination-Participation Restrictions Community Activity;Yard Work;Laundry;Meal Prep    PT Frequency 1x / week    PT Duration 6 weeks    PT Treatment/Interventions ADLs/Self Care Home Management;Cryotherapy;Electrical Stimulation;Iontophoresis 58m/ml Dexamethasone;Moist Heat;Therapeutic exercise;Balance training;Therapeutic activities;Functional mobility training;Stair training;Ultrasound;Neuromuscular re-education;Patient/family education;Manual techniques;Vasopneumatic Device;Taping;Dry needling;Passive range of motion    PT Next Visit Plan Continue eccentric loading/strengthening for Rt quad/tendon.  Reassessment next visit of FOTO/data.    PT Home Exercise Plan Access Code: [email or web address]   Consulted and Agree with Plan of Care Patient           Patient will benefit from skilled therapeutic intervention in order to improve the following deficits and impairments:  Abnormal gait,Pain,Decreased strength,Difficulty walking,Decreased mobility,Decreased range of motion,Decreased balance  Visit Diagnosis: Acute pain of right knee  Other abnormalities of gait and mobility     Problem List Patient Active Problem List   Diagnosis Date Noted  . Primary osteoarthritis involving multiple joints 08/22/2020  . OAB (overactive bladder) 08/22/2020  . Acid reflux 04/06/2018    MScot Jun PT, DPT, OCS, ATC 01/11/21  9:25 AM   PHYSICAL THERAPY DISCHARGE SUMMARY  Visits from Start of Care: 4  Current functional level related to goals / functional outcomes: See note   Remaining deficits: See note   Education  / Equipment: HEP Plan: Patient agrees to discharge.  Patient goals were partially met. Patient is being discharged due to not returning since the last visit.  ?????    MScot Jun PT, DPT, OCS, ATC 03/08/21  11:25 AM        [facility] [address]  [place], [address] Phone: [phone]   Fax:  [phone]  Name: [name] MRN: [id number] Date of Birth: 1940/11/08

## 2021-01-16 ENCOUNTER — Other Ambulatory Visit: Payer: Self-pay

## 2021-01-16 DIAGNOSIS — M545 Low back pain, unspecified: Secondary | ICD-10-CM

## 2021-01-17 MED ORDER — CYCLOBENZAPRINE HCL 10 MG PO TABS: 10.0000 mg | ORAL_TABLET | Freq: Three times a day (TID) | ORAL | 0 refills | Status: DC | PRN

## 2021-01-17 NOTE — Telephone Encounter (Signed)
Please advise 

## 2021-01-17 NOTE — Telephone Encounter (Signed)
Ok to work her in next week?

## 2021-01-18 ENCOUNTER — Encounter: Payer: PPO | Admitting: Rehabilitative and Restorative Service Providers"

## 2021-01-21 ENCOUNTER — Other Ambulatory Visit: Payer: Self-pay

## 2021-01-21 ENCOUNTER — Ambulatory Visit
Admission: RE | Admit: 2021-01-21 | Discharge: 2021-01-21 | Disposition: A | Discharge status: Home / Self Care | Payer: PPO | Source: Ambulatory Visit | Attending: Physician Assistant | Admitting: Physician Assistant

## 2021-01-21 DIAGNOSIS — M545 Low back pain, unspecified: Secondary | ICD-10-CM

## 2021-01-29 ENCOUNTER — Ambulatory Visit: Payer: PPO | Admitting: Orthopaedic Surgery

## 2021-01-29 ENCOUNTER — Encounter: Payer: Self-pay | Admitting: Orthopaedic Surgery

## 2021-01-29 DIAGNOSIS — M545 Low back pain, unspecified: Secondary | ICD-10-CM

## 2021-01-29 MED ORDER — CYCLOBENZAPRINE HCL 10 MG PO TABS: 10.0000 mg | ORAL_TABLET | Freq: Three times a day (TID) | ORAL | 0 refills | Status: DC | PRN

## 2021-01-29 NOTE — Progress Notes (Signed)
Patient comes today to go over MRI of her lumbar spine.  [name] is well-known to [place].  She is active 80 year old female.  She been having worsening low back pain with left-sided radicular symptoms.  Since her last visit when she had a steroid taper, she is feeling better.  She has better days at this point than not.  Today she is feeling well but she had a period of time last week which her husband had to help remove some.  She appears comfortable this morning.  She has no weakness in her lower extremities with no radicular symptoms.  MRIs reviewed with her and it shows at L4-L5 there is some mild facet spurring and disc bulging with left foraminal annular fissure and shallow protrusion.  This could be causing her symptoms.  My next step will be to order a left-sided L4-L5 epidural steroid injection.  However, given the fact that she is having such an improvement in her symptoms is doing well we both agree that holding off on this injection and only ordering it if her symptoms come back or worsen.  All questions and concerns were answered and addressed.  Follow-up is as needed.

## 2021-02-06 DIAGNOSIS — N3941 Urge incontinence: Secondary | ICD-10-CM | POA: Diagnosis not present

## 2021-03-06 DIAGNOSIS — N3941 Urge incontinence: Secondary | ICD-10-CM | POA: Diagnosis not present

## 2021-04-03 DIAGNOSIS — N3941 Urge incontinence: Secondary | ICD-10-CM | POA: Diagnosis not present

## 2021-05-08 DIAGNOSIS — N3941 Urge incontinence: Secondary | ICD-10-CM | POA: Diagnosis not present

## 2021-05-23 DIAGNOSIS — Z85819 Personal history of malignant neoplasm of unspecified site of lip, oral cavity, and pharynx: Secondary | ICD-10-CM | POA: Diagnosis not present

## 2021-05-23 DIAGNOSIS — J302 Other seasonal allergic rhinitis: Secondary | ICD-10-CM | POA: Diagnosis not present

## 2021-05-23 DIAGNOSIS — H9193 Unspecified hearing loss, bilateral: Secondary | ICD-10-CM | POA: Diagnosis not present

## 2021-06-04 DIAGNOSIS — E669 Obesity, unspecified: Secondary | ICD-10-CM | POA: Diagnosis not present

## 2021-06-04 DIAGNOSIS — Z86010 Personal history of colonic polyps: Secondary | ICD-10-CM | POA: Diagnosis not present

## 2021-06-04 DIAGNOSIS — K219 Gastro-esophageal reflux disease without esophagitis: Secondary | ICD-10-CM | POA: Diagnosis not present

## 2021-06-05 DIAGNOSIS — N3941 Urge incontinence: Secondary | ICD-10-CM | POA: Diagnosis not present

## 2021-06-08 ENCOUNTER — Other Ambulatory Visit: Payer: PPO

## 2021-06-28 DIAGNOSIS — K5730 Diverticulosis of large intestine without perforation or abscess without bleeding: Secondary | ICD-10-CM | POA: Diagnosis not present

## 2021-06-28 DIAGNOSIS — Z86010 Personal history of colonic polyps: Secondary | ICD-10-CM | POA: Diagnosis not present

## 2021-06-28 LAB — HM COLONOSCOPY

## 2021-07-02 ENCOUNTER — Ambulatory Visit: Payer: Self-pay

## 2021-07-02 ENCOUNTER — Ambulatory Visit: Payer: PPO | Admitting: Orthopaedic Surgery

## 2021-07-02 ENCOUNTER — Other Ambulatory Visit: Payer: Self-pay

## 2021-07-02 DIAGNOSIS — M25561 Pain in right knee: Secondary | ICD-10-CM

## 2021-07-02 DIAGNOSIS — M79641 Pain in right hand: Secondary | ICD-10-CM | POA: Diagnosis not present

## 2021-07-02 DIAGNOSIS — Z96651 Presence of right artificial knee joint: Secondary | ICD-10-CM

## 2021-07-02 MED ORDER — METHYLPREDNISOLONE 4 MG PO TABS: ORAL_TABLET | ORAL | 0 refills | Status: DC

## 2021-07-02 NOTE — Progress Notes (Signed)
The patient is a long-term patient of mine.  We have actually performed multiple knee surgeries on the right knee over time.  She will be 80 years old next month.  She started with a knee arthroscopy and eventually had a unicompartmental knee replacement over the medial compartment of her knee.  That was eventually converted to a total knee.  She continues to have medial knee pain.  She also has been having some right hand pain over her middle and index fingers at the MCP joints with no known injury.  She denies any numbness and tingling.  Examination of her right hand shows no swelling.  There is no triggering.  She has weak pinch and grip strength.  There is no numbness and tingling.  Examination of her right knee shows well-healed surgical incisions.  There is no knee joint effusion.  Her knee is ligamentously stable and has full range of motion.  The pain seems to be over the pes bursa area of her right knee.  She is not interested in any physical therapy.  We will try a Medrol taper to see if that helps and I recommended Voltaren gel for her hand and her knee.  Have also recommended she consider a knee sleeve for the right side they can offer her some compression and hopefully that can help.  I model also what else to try.  All questions and concerns were answered and addressed.  Follow-up can be as needed.

## 2021-07-03 DIAGNOSIS — N3941 Urge incontinence: Secondary | ICD-10-CM | POA: Diagnosis not present

## 2021-07-23 DIAGNOSIS — N3941 Urge incontinence: Secondary | ICD-10-CM | POA: Diagnosis not present

## 2021-08-14 DIAGNOSIS — H2513 Age-related nuclear cataract, bilateral: Secondary | ICD-10-CM | POA: Diagnosis not present

## 2021-08-16 DIAGNOSIS — N3941 Urge incontinence: Secondary | ICD-10-CM | POA: Diagnosis not present

## 2021-09-03 ENCOUNTER — Encounter: Payer: Self-pay | Admitting: Internal Medicine

## 2021-09-03 ENCOUNTER — Ambulatory Visit (INDEPENDENT_AMBULATORY_CARE_PROVIDER_SITE_OTHER): Payer: PPO | Admitting: Internal Medicine

## 2021-09-03 ENCOUNTER — Other Ambulatory Visit: Payer: Self-pay

## 2021-09-03 ENCOUNTER — Other Ambulatory Visit: Payer: Self-pay | Admitting: Internal Medicine

## 2021-09-03 VITALS — BP 162/80 | HR 68 | Temp 97.5°F | Resp 18 | Ht 62.0 in | Wt 184.8 lb

## 2021-09-03 DIAGNOSIS — M159 Polyosteoarthritis, unspecified: Secondary | ICD-10-CM

## 2021-09-03 DIAGNOSIS — K219 Gastro-esophageal reflux disease without esophagitis: Secondary | ICD-10-CM

## 2021-09-03 DIAGNOSIS — E6609 Other obesity due to excess calories: Secondary | ICD-10-CM | POA: Insufficient documentation

## 2021-09-03 DIAGNOSIS — Z0001 Encounter for general adult medical examination with abnormal findings: Secondary | ICD-10-CM

## 2021-09-03 DIAGNOSIS — N3281 Overactive bladder: Secondary | ICD-10-CM | POA: Diagnosis not present

## 2021-09-03 DIAGNOSIS — Z1231 Encounter for screening mammogram for malignant neoplasm of breast: Secondary | ICD-10-CM

## 2021-09-03 DIAGNOSIS — R7309 Other abnormal glucose: Secondary | ICD-10-CM | POA: Diagnosis not present

## 2021-09-03 DIAGNOSIS — Z23 Encounter for immunization: Secondary | ICD-10-CM

## 2021-09-03 DIAGNOSIS — E785 Hyperlipidemia, unspecified: Secondary | ICD-10-CM | POA: Insufficient documentation

## 2021-09-03 DIAGNOSIS — Z6833 Body mass index (BMI) 33.0-33.9, adult: Secondary | ICD-10-CM

## 2021-09-03 DIAGNOSIS — E782 Mixed hyperlipidemia: Secondary | ICD-10-CM

## 2021-09-03 DIAGNOSIS — R030 Elevated blood-pressure reading, without diagnosis of hypertension: Secondary | ICD-10-CM | POA: Diagnosis not present

## 2021-09-03 NOTE — Progress Notes (Signed)
Subjective:    Patient ID: [name], female    DOB: [birth year], 80 y.o.   MRN: [id number]  HPI  Pt presents to the clinic today for her annual exam. She is also due to follow up chronic conditions.  OA: Mainly in her hands and knees. She takes Glucosamine/Chondroitin as prescribed. She follows with [name].  GERD: Triggered by spicy foods. She has occasional breakthrough on Esomeprazole for which she takes Tums with good relief of symptoms. Upper GI from 05/2018 reviewed.  OAB: Mainly urinary urgency and frequency. Managed with Myrbetriq and Solone. She continues to get shock therapy. She follows with urology.  HLD: Her last LDL was 111, triglycerides 152, 08/2020. She is taking Fish Oil OTC. She tries to consume a low fat diet.  Flu: 08/2020 Tetanus: 07/2012 Covid:Pfizer x 2, Moderna x 2 Pneumovax: 07/2007 Prevnar: 07/2014 Shingrix: 08/2018, 11/2018 Pap smear: no longer screening Mammogram: 09/2020 Bone density: 12/2020 Colon screening: 06/2021 Vision screening: annually Dentist: biannually  Diet: She does eat some meat. She does eat fruits and veggies. She tries to avoid fried foods. She drinks mostly water. Exercise: Walking, ride stationary bike  Review of Systems     Past Medical History:  Diagnosis Date   Arthritis    Cancer ([place])    History of chicken pox    History of colon polyps    History of mouth cancer    Osteoarthritis of knee    Medial compartment, Right    Current Outpatient Medications  Medication Sig Dispense Refill   aspirin EC 81 MG tablet Take 81 mg by mouth daily.     Calcium Carb-Cholecalciferol 600-800 MG-UNIT TABS Take 1 tablet by mouth 2 (two) times daily.      cetirizine (ZYRTEC) 10 MG tablet Take 10 mg by mouth daily.     esomeprazole (NEXIUM) 20 MG capsule Take 20 mg by mouth daily before breakfast.      fluticasone (FLONASE) 50 MCG/ACT nasal spray Place 2 sprays into the nose at bedtime.      Glucosamine-Chondroitin (COSAMIN  DS PO) Take 1 tablet by mouth 2 (two) times daily.     Multiple Vitamins-Minerals (MULTIVITAMIN WITH MINERALS) tablet Take 1 tablet by mouth every evening. Women's One-A-Day     MYRBETRIQ 50 MG TB24 tablet Take 50 mg by mouth daily.     Omega-3 Fatty Acids (FISH OIL) 1000 MG CAPS Take 1,000 mg by mouth daily.     TURMERIC PO Take 538 mg by mouth daily.     No current facility-administered medications for this visit.    Allergies  Allergen Reactions   Ibuprofen Nausea And Vomiting and Other (See Comments)    GI upset, GERD    No family history on file.  Social History   Socioeconomic History   Marital status: Married    Spouse name: Not on file   Number of children: Not on file   Years of education: Not on file   Highest education level: Not on file  Occupational History   Not on file  Tobacco Use   Smoking status: Former    Types: Cigarettes    Quit date: 12/28/1986    Years since quitting: 34.7   Smokeless tobacco: Never  Vaping Use   Vaping Use: Never used  Substance and Sexual Activity   Alcohol use: No   Drug use: No   Sexual activity: Not Currently  Other Topics Concern   Not on file  Social History Narrative  Not on file   Social Determinants of Health   Financial Resource Strain: Not on file  Food Insecurity: Not on file  Transportation Needs: Not on file  Physical Activity: Not on file  Stress: Not on file  Social Connections: Not on file  Intimate Partner Violence: Not on file     Constitutional: Denies fever, malaise, fatigue, headache or abrupt weight changes.  HEENT: Denies eye pain, eye redness, ear pain, ringing in the ears, wax buildup, runny nose, nasal congestion, bloody nose, or sore throat. Respiratory: Denies difficulty breathing, shortness of breath, cough or sputum production.   Cardiovascular: Denies chest pain, chest tightness, palpitations or swelling in the hands or feet.  Gastrointestinal: Denies abdominal pain, bloating,  constipation, diarrhea or blood in the stool.  GU: Pt reports urinary frequency and urgency. Denies pain with urination, burning sensation, blood in urine, odor or discharge. Musculoskeletal: Pt reports intermittent joint pain. Denies decrease in range of motion, difficulty with gait, muscle pain or joint swelling.  Skin: Denies redness, rashes, lesions or ulcercations.  Neurological: Denies dizziness, difficulty with memory, difficulty with speech or problems with balance and coordination.  Psych: Denies anxiety, depression, SI/HI.  No other specific complaints in a complete review of systems (except as listed in HPI above).  Objective:   Physical Exam BP (!) 164/65 (BP Location: Left Arm, Patient Position: Sitting, Cuff Size: Normal)   Pulse 68   Temp (!) 97.5 F (36.4 C) (Temporal)   Resp 18   Ht 5\' 2"  (1.575 m)   Wt 184 lb 12.8 oz (83.8 kg)   LMP  (LMP Unknown)   SpO2 98%   BMI 33.80 kg/m   Wt Readings from Last 3 Encounters:  09/18/20 180 lb (81.6 kg)  08/22/20 180 lb (81.6 kg)  05/08/20 180 lb 6.4 oz (81.8 kg)    General: Appears her stated age, obese,in NAD. Skin: Warm, dry and intact.  HEENT: Head: normal shape and size; Eyes: sclera white and EOMs intact;  Neck:  Neck supple, trachea midline. No masses, lumps or thyromegaly present.  Cardiovascular: Normal rate and rhythm. S1,S2 noted.  No murmur, rubs or gallops noted. No JVD or BLE edema. No carotid bruits noted. Pulmonary/Chest: Normal effort and positive vesicular breath sounds. No respiratory distress. No wheezes, rales or ronchi noted.  Abdomen: Soft and nontender. Normal bowel sounds. No distention or masses noted. Liver, spleen and kidneys non palpable. Musculoskeletal: Strength 5/5 BUE/BLE. No difficulty with gait.  Neurological: Alert and oriented. Cranial nerves II-XII grossly intact. Coordination normal.  Psychiatric: Mood and affect normal. Behavior is normal. Judgment and thought content normal.     BMET    Component Value Date/Time   NA 139 08/22/2020 1211   K 4.3 08/22/2020 1211   CL 102 08/22/2020 1211   CO2 30 08/22/2020 1211   GLUCOSE 100 (H) 08/22/2020 1211   BUN 16 08/22/2020 1211   CREATININE 0.74 08/22/2020 1211   CREATININE 0.59 (L) 06/24/2016 0853   CALCIUM 9.2 08/22/2020 1211   GFRNONAA >60 06/16/2019 0337   GFRNONAA >89 06/24/2016 0853   GFRAA >60 06/16/2019 0337   GFRAA >89 06/24/2016 0853    Lipid Panel     Component Value Date/Time   CHOL 186 08/22/2020 1211   TRIG 152.0 (H) 08/22/2020 1211   HDL 44.20 08/22/2020 1211   CHOLHDL 4 08/22/2020 1211   VLDL 30.4 08/22/2020 1211   LDLCALC 111 (H) 08/22/2020 1211    CBC    Component Value Date/Time  WBC 6.3 08/22/2020 1211   RBC 5.04 08/22/2020 1211   HGB 14.9 08/22/2020 1211   HCT 45.1 08/22/2020 1211   PLT 197.0 08/22/2020 1211   MCV 89.6 08/22/2020 1211   MCV 86.6 06/24/2016 0909   MCH 30.0 06/16/2019 0337   MCHC 33.0 08/22/2020 1211   RDW 13.8 08/22/2020 1211    Hgb A1C No results found for: HGBA1C          Assessment & Plan:   Preventative Health Maintenance:  Flu shot today Tetanus UTD Encouraged her to get her covid booster Pneumovax UTD Prevnar UTD Shingrix UTD She no longer wants to screen for cervical cancer Mammogram due- she will call to schedule Bone density UTD Colon screening UTD Encouraged her to consume a balanced diet and exercise regimen Advised her to see an eye doctor and dentist annually Will check CBC, CMET, Lipid, A1C today  RTC in 2 weeks for nurse visit BP check Webb Silversmith, NP This visit occurred during the SARS-CoV-2 public health emergency.  Safety protocols were in place, including screening questions prior to the visit, additional usage of staff PPE, and extensive cleaning of exam room while observing appropriate contact time as indicated for disinfecting solutions.

## 2021-09-03 NOTE — Assessment & Plan Note (Signed)
Continue Glucosamine/Condroitin Encouraged regular physical activity

## 2021-09-03 NOTE — Patient Instructions (Signed)
Health Maintenance for Postmenopausal Women ?Menopause is a normal process in which your ability to get pregnant comes to an end. This process happens slowly over many months or years, usually between the ages of 48 and 55. Menopause is complete when you have missed your menstrual period for 12 months. ?It is important to talk with your health care provider about some of the most common conditions that affect women after menopause (postmenopausal women). These include heart disease, cancer, and bone loss (osteoporosis). Adopting a healthy lifestyle and getting preventive care can help to promote your health and wellness. The actions you take can also lower your chances of developing some of these common conditions. ?What are the signs and symptoms of menopause? ?During menopause, you may have the following symptoms: ?Hot flashes. These can be moderate or severe. ?Night sweats. ?Decrease in sex drive. ?Mood swings. ?Headaches. ?Tiredness (fatigue). ?Irritability. ?Memory problems. ?Problems falling asleep or staying asleep. ?Talk with your health care provider about treatment options for your symptoms. ?Do I need hormone replacement therapy? ?Hormone replacement therapy is effective in treating symptoms that are caused by menopause, such as hot flashes and night sweats. ?Hormone replacement carries certain risks, especially as you become older. If you are thinking about using estrogen or estrogen with progestin, discuss the benefits and risks with your health care provider. ?How can I reduce my risk for heart disease and stroke? ?The risk of heart disease, heart attack, and stroke increases as you age. One of the causes may be a change in the body's hormones during menopause. This can affect how your body uses dietary fats, triglycerides, and cholesterol. Heart attack and stroke are medical emergencies. There are many things that you can do to help prevent heart disease and stroke. ?Watch your blood pressure ?High  blood pressure causes heart disease and increases the risk of stroke. This is more likely to develop in people who have high blood pressure readings or are overweight. ?Have your blood pressure checked: ?Every 3-5 years if you are 18-39 years of age. ?Every year if you are 40 years old or older. ?Eat a healthy diet ? ?Eat a diet that includes plenty of vegetables, fruits, low-fat dairy products, and lean protein. ?Do not eat a lot of foods that are high in solid fats, added sugars, or sodium. ?Get regular exercise ?Get regular exercise. This is one of the most important things you can do for your health. Most adults should: ?Try to exercise for at least 150 minutes each week. The exercise should increase your heart rate and make you sweat (moderate-intensity exercise). ?Try to do strengthening exercises at least twice each week. Do these in addition to the moderate-intensity exercise. ?Spend less time sitting. Even light physical activity can be beneficial. ?Other tips ?Work with your health care provider to achieve or maintain a healthy weight. ?Do not use any products that contain nicotine or tobacco. These products include cigarettes, chewing tobacco, and vaping devices, such as e-cigarettes. If you need help quitting, ask your health care provider. ?Know your numbers. Ask your health care provider to check your cholesterol and your blood sugar (glucose). Continue to have your blood tested as directed by your health care provider. ?Do I need screening for cancer? ?Depending on your health history and family history, you may need to have cancer screenings at different stages of your life. This may include screening for: ?Breast cancer. ?Cervical cancer. ?Lung cancer. ?Colorectal cancer. ?What is my risk for osteoporosis? ?After menopause, you may be   at increased risk for osteoporosis. Osteoporosis is a condition in which bone destruction happens more quickly than new bone creation. To help prevent osteoporosis or  the bone fractures that can happen because of osteoporosis, you may take the following actions: ?If you are 19-50 years old, get at least 1,000 mg of calcium and at least 600 international units (IU) of vitamin D per day. ?If you are older than age 50 but younger than age 70, get at least 1,200 mg of calcium and at least 600 international units (IU) of vitamin D per day. ?If you are older than age 70, get at least 1,200 mg of calcium and at least 800 international units (IU) of vitamin D per day. ?Smoking and drinking excessive alcohol increase the risk of osteoporosis. Eat foods that are rich in calcium and vitamin D, and do weight-bearing exercises several times each week as directed by your health care provider. ?How does menopause affect my mental health? ?Depression may occur at any age, but it is more common as you become older. Common symptoms of depression include: ?Feeling depressed. ?Changes in sleep patterns. ?Changes in appetite or eating patterns. ?Feeling an overall lack of motivation or enjoyment of activities that you previously enjoyed. ?Frequent crying spells. ?Talk with your health care provider if you think that you are experiencing any of these symptoms. ?General instructions ?See your health care provider for regular wellness exams and vaccines. This may include: ?Scheduling regular health, dental, and eye exams. ?Getting and maintaining your vaccines. These include: ?Influenza vaccine. Get this vaccine each year before the flu season begins. ?Pneumonia vaccine. ?Shingles vaccine. ?Tetanus, diphtheria, and pertussis (Tdap) booster vaccine. ?Your health care provider may also recommend other immunizations. ?Tell your health care provider if you have ever been abused or do not feel safe at home. ?Summary ?Menopause is a normal process in which your ability to get pregnant comes to an end. ?This condition causes hot flashes, night sweats, decreased interest in sex, mood swings, headaches, or lack  of sleep. ?Treatment for this condition may include hormone replacement therapy. ?Take actions to keep yourself healthy, including exercising regularly, eating a healthy diet, watching your weight, and checking your blood pressure and blood sugar levels. ?Get screened for cancer and depression. Make sure that you are up to date with all your vaccines. ?This information is not intended to replace advice given to you by your health care provider. Make sure you discuss any questions you have with your health care provider. ?Document Revised: 02/12/2021 Document Reviewed: 02/12/2021 ?Elsevier Patient Education ? 2022 [employer] ? ?

## 2021-09-03 NOTE — Assessment & Plan Note (Signed)
CMET and lipid profile today Encouraged her to consume a low fat diet, increase aerobic exercise Continue Fish Oil

## 2021-09-03 NOTE — Assessment & Plan Note (Signed)
Try to avoid foods that trigger your reflux Encouraged weight loss as this can reduce reflux symptoms Continue Esomeprazole and Tums as needed

## 2021-09-03 NOTE — Assessment & Plan Note (Signed)
She would like to start on an appetite suppressant, will discuss this pending labs Encouraged diet and exercise for weight loss

## 2021-09-03 NOTE — Assessment & Plan Note (Signed)
Manual repeat remains elevated Reinforced DASH diet, exercise for weight loss  RTC in 2 weeks for nurse visit BP check

## 2021-09-03 NOTE — Assessment & Plan Note (Signed)
Continue Myrbetriq, Solone and shock therapy She will continue to follow with urology

## 2021-09-04 DIAGNOSIS — R109 Unspecified abdominal pain: Secondary | ICD-10-CM | POA: Diagnosis not present

## 2021-09-04 DIAGNOSIS — N3281 Overactive bladder: Secondary | ICD-10-CM | POA: Diagnosis not present

## 2021-09-04 DIAGNOSIS — N3941 Urge incontinence: Secondary | ICD-10-CM | POA: Diagnosis not present

## 2021-09-04 LAB — COMPLETE METABOLIC PANEL WITH GFR
AG Ratio: 1.5 (calc) (ref 1.0–2.5)
ALT: 15 U/L (ref 6–29)
AST: 19 U/L (ref 10–35)
Albumin: 4 g/dL (ref 3.6–5.1)
Alkaline phosphatase (APISO): 68 U/L (ref 37–153)
BUN: 19 mg/dL (ref 7–25)
CO2: 27 mmol/L (ref 20–32)
Calcium: 8.9 mg/dL (ref 8.6–10.4)
Chloride: 106 mmol/L (ref 98–110)
Creat: 0.72 mg/dL (ref 0.60–0.95)
Globulin: 2.6 g/dL (calc) (ref 1.9–3.7)
Glucose, Bld: 101 mg/dL — ABNORMAL HIGH (ref 65–99)
Potassium: 4.2 mmol/L (ref 3.5–5.3)
Sodium: 142 mmol/L (ref 135–146)
Total Bilirubin: 0.3 mg/dL (ref 0.2–1.2)
Total Protein: 6.6 g/dL (ref 6.1–8.1)
eGFR: 84 mL/min/{1.73_m2} (ref >=60)

## 2021-09-04 LAB — CBC
HCT: 43.7 % (ref 35.0–45.0)
Hemoglobin: 14.7 g/dL (ref 11.7–15.5)
MCH: 30 pg (ref 27.0–33.0)
MCHC: 33.6 g/dL (ref 32.0–36.0)
MCV: 89.2 fL (ref 80.0–100.0)
MPV: 10 fL (ref 7.5–12.5)
Platelets: 174 10*3/uL (ref 140–400)
RBC: 4.9 10*6/uL (ref 3.80–5.10)
RDW: 13 % (ref 11.0–15.0)
WBC: 5.9 10*3/uL (ref 3.8–10.8)

## 2021-09-04 LAB — LIPID PANEL
Cholesterol: 198 mg/dL (ref <200)
HDL: 52 mg/dL (ref >=50)
LDL Cholesterol (Calc): 124 mg/dL (calc) — ABNORMAL HIGH
Non-HDL Cholesterol (Calc): 146 mg/dL (calc) — ABNORMAL HIGH (ref <130)
Total CHOL/HDL Ratio: 3.8 (calc) (ref <5.0)
Triglycerides: 111 mg/dL (ref <150)

## 2021-09-04 LAB — HEMOGLOBIN A1C
Hgb A1c MFr Bld: 5.8 % of total Hgb — ABNORMAL HIGH (ref <5.7)
Mean Plasma Glucose: 120 mg/dL
eAG (mmol/L): 6.6 mmol/L

## 2021-09-18 ENCOUNTER — Other Ambulatory Visit: Payer: Self-pay

## 2021-09-18 ENCOUNTER — Ambulatory Visit: Payer: PPO

## 2021-09-18 VITALS — BP 148/76

## 2021-09-18 DIAGNOSIS — R030 Elevated blood-pressure reading, without diagnosis of hypertension: Secondary | ICD-10-CM

## 2021-09-19 ENCOUNTER — Ambulatory Visit: Payer: PPO

## 2021-09-25 DIAGNOSIS — N3941 Urge incontinence: Secondary | ICD-10-CM | POA: Diagnosis not present

## 2021-10-05 DIAGNOSIS — N3946 Mixed incontinence: Secondary | ICD-10-CM | POA: Diagnosis not present

## 2021-10-09 ENCOUNTER — Ambulatory Visit
Admission: RE | Admit: 2021-10-09 | Discharge: 2021-10-09 | Disposition: A | Discharge status: Home / Self Care | Payer: PPO | Source: Ambulatory Visit | Attending: Internal Medicine | Admitting: Internal Medicine

## 2021-10-09 DIAGNOSIS — Z1231 Encounter for screening mammogram for malignant neoplasm of breast: Secondary | ICD-10-CM

## 2021-10-24 DIAGNOSIS — N3281 Overactive bladder: Secondary | ICD-10-CM | POA: Diagnosis not present

## 2021-10-24 DIAGNOSIS — N3946 Mixed incontinence: Secondary | ICD-10-CM | POA: Diagnosis not present

## 2022-01-01 DIAGNOSIS — L812 Freckles: Secondary | ICD-10-CM | POA: Diagnosis not present

## 2022-01-01 DIAGNOSIS — Z08 Encounter for follow-up examination after completed treatment for malignant neoplasm: Secondary | ICD-10-CM | POA: Diagnosis not present

## 2022-01-01 DIAGNOSIS — L819 Disorder of pigmentation, unspecified: Secondary | ICD-10-CM | POA: Diagnosis not present

## 2022-01-01 DIAGNOSIS — L708 Other acne: Secondary | ICD-10-CM | POA: Diagnosis not present

## 2022-01-01 DIAGNOSIS — Z85828 Personal history of other malignant neoplasm of skin: Secondary | ICD-10-CM | POA: Diagnosis not present

## 2022-01-01 DIAGNOSIS — L570 Actinic keratosis: Secondary | ICD-10-CM | POA: Diagnosis not present

## 2022-01-10 ENCOUNTER — Ambulatory Visit: Payer: PPO | Admitting: Podiatry

## 2022-01-10 ENCOUNTER — Encounter: Payer: Self-pay | Admitting: Podiatry

## 2022-01-10 DIAGNOSIS — L600 Ingrowing nail: Secondary | ICD-10-CM | POA: Diagnosis not present

## 2022-01-10 NOTE — Progress Notes (Signed)
?Subjective:  ?Patient ID: [name], female    DOB: 18-May-1941,  MRN: [id number] ? ?Chief Complaint  ?Patient presents with  ? Nail Problem  ?  Nail fungus  ? ? ?81 y.o. female presents with the above complaint.  Patient presents with thickened elongated dystrophic toenails x1 to left third toe.  Patient states painful to touch progressive gotten worse.  They are ingrowing on the side.  She would like to have the whole nail removed and made permanent.  She has had it done in the past by Dr. Paulla Dolly to the right great toe which seems to have healed well is doing well.  She denies any other acute complaints she has tried all conservative treatment options and failed.  Pain scale 7 out of 10 hurts with ambulation. ? ? ?Review of Systems: Negative except as noted in the HPI. Denies N/V/F/Ch. ? ?Past Medical History:  ?Diagnosis Date  ? Arthritis   ? Cancer [facility])   ? History of chicken pox   ? History of colon polyps   ? History of mouth cancer   ? Osteoarthritis of knee   ? Medial compartment, Right  ? ? ?Current Outpatient Medications:  ?  aspirin EC 81 MG tablet, Take 81 mg by mouth daily., Disp: , Rfl:  ?  Calcium Carb-Cholecalciferol 600-800 MG-UNIT TABS, Take 1 tablet by mouth 2 (two) times daily. , Disp: , Rfl:  ?  cetirizine (ZYRTEC) 10 MG tablet, Take 10 mg by mouth daily., Disp: , Rfl:  ?  esomeprazole (NEXIUM) 20 MG capsule, Take 20 mg by mouth daily before breakfast. , Disp: , Rfl:  ?  fluticasone (FLONASE) 50 MCG/ACT nasal spray, Place 2 sprays into the nose at bedtime. , Disp: , Rfl:  ?  Glucosamine-Chondroitin (COSAMIN DS PO), Take 1 tablet by mouth 2 (two) times daily., Disp: , Rfl:  ?  Multiple Vitamins-Minerals (MULTIVITAMIN WITH MINERALS) tablet, Take 1 tablet by mouth every evening. Women's One-A-Day, Disp: , Rfl:  ?  MYRBETRIQ 50 MG TB24 tablet, Take 50 mg by mouth daily., Disp: , Rfl:  ?  Omega-3 Fatty Acids (FISH OIL) 1000 MG CAPS, Take 1,000 mg by mouth daily., Disp: , Rfl:  ?  prednisoLONE 5  MG TABS tablet, Take 1 tablet (5 mg total) by mouth daily., Disp: 90 tablet, Rfl: 0 ?  TURMERIC PO, Take 538 mg by mouth daily., Disp: , Rfl:  ? ?Social History  ? ?Tobacco Use  ?Smoking Status Former  ? Types: Cigarettes  ? Quit date: 12/28/1986  ? Years since quitting: 35.0  ?Smokeless Tobacco Never  ? ? ?Allergies  ?Allergen Reactions  ? Ibuprofen Nausea And Vomiting and Other (See Comments)  ?  GI upset, GERD  ? ?Objective:  ?There were no vitals filed for this visit. ?There is no height or weight on file to calculate BMI. ?Constitutional Well developed. ?Well nourished.  ?Vascular Dorsalis pedis pulses palpable bilaterally. ?Posterior tibial pulses palpable bilaterally. ?Capillary refill normal to all digits.  ?No cyanosis or clubbing noted. ?Pedal hair growth normal.  ?Neurologic Normal speech. ?Oriented to person, place, and time. ?Epicritic sensation to light touch grossly present bilaterally.  ?Dermatologic Pain on palpation of the entire/total nail on 3rd digit of the left ?No other open wounds. ?No skin lesions.  ?Orthopedic: Normal joint ROM without pain or crepitus bilaterally. ?No visible deformities. ?No bony tenderness.  ? ?Radiographs: None ?Assessment:  ? ?1. Ingrown nail of third toe of left foot   ? ?Plan:  ?  Patient was evaluated and treated and all questions answered. ? ?Nail contusion/dystrophy third, left with underlying ingrown ?-Patient elects to proceed with minor surgery to remove entire toenail today. Consent reviewed and signed by patient. ?-Entire/total nail excised. See procedure note. ?-Educated on post-procedure care including soaking. Written instructions provided and reviewed. ?-Patient to follow up in 2 weeks for nail check. ? ?Procedure: Excision of entire/total nail with phenol matricectomy ?Location: Left 3rd toe digit ?Anesthesia: Lidocaine 1% plain; 1.5 mL and Marcaine 0.5% plain; 1.5 mL, digital block. ?Skin Prep: Betadine. ?Dressing: Silvadene; telfa; dry, sterile,  compression dressing. ?Technique: Following skin prep, the toe was exsanguinated and a tourniquet was secured at the base of the toe. The affected nail border was freed and excised.  Phenol matricectomy was performed in standard technique the tourniquet was then removed and sterile dressing applied. ?Disposition: Patient tolerated procedure well. Patient to return in 2 weeks for follow-up.  ? ?No follow-ups on file. ? ?

## 2022-02-18 DIAGNOSIS — N3946 Mixed incontinence: Secondary | ICD-10-CM | POA: Diagnosis not present

## 2022-03-28 DIAGNOSIS — N3946 Mixed incontinence: Secondary | ICD-10-CM | POA: Diagnosis not present

## 2022-04-03 DIAGNOSIS — L814 Other melanin hyperpigmentation: Secondary | ICD-10-CM | POA: Diagnosis not present

## 2022-04-03 DIAGNOSIS — L570 Actinic keratosis: Secondary | ICD-10-CM | POA: Diagnosis not present

## 2022-04-03 DIAGNOSIS — L578 Other skin changes due to chronic exposure to nonionizing radiation: Secondary | ICD-10-CM | POA: Diagnosis not present

## 2022-04-16 DIAGNOSIS — N3946 Mixed incontinence: Secondary | ICD-10-CM | POA: Diagnosis not present

## 2022-04-16 DIAGNOSIS — N3281 Overactive bladder: Secondary | ICD-10-CM | POA: Diagnosis not present

## 2022-04-18 DIAGNOSIS — H57813 Brow ptosis, bilateral: Secondary | ICD-10-CM | POA: Diagnosis not present

## 2022-05-17 ENCOUNTER — Other Ambulatory Visit: Payer: Self-pay | Admitting: *Deleted

## 2022-05-17 NOTE — Patient Outreach (Signed)
  Care Coordination   05/17/2022 Name: [name] MRN: [id number] DOB: 1941/08/31   Care Coordination Outreach Attempts:  An unsuccessful telephone outreach was attempted today to offer the patient information about available care coordination services as a benefit of their health plan.   Follow Up Plan:  No further outreach attempts will be made at this time. We have been unable to contact the patient to offer or enroll patient in care coordination services  Encounter Outcome:  Pt. Refused  Care Coordination Interventions Activated:  No   Care Coordination Interventions:  No, not indicated    Emelia Loron RN, BSN [place] [phone] [email or web address]

## 2022-05-29 ENCOUNTER — Ambulatory Visit: Payer: Self-pay | Admitting: *Deleted

## 2022-05-29 DIAGNOSIS — N3001 Acute cystitis with hematuria: Secondary | ICD-10-CM | POA: Diagnosis not present

## 2022-05-29 DIAGNOSIS — R309 Painful micturition, unspecified: Secondary | ICD-10-CM | POA: Diagnosis not present

## 2022-05-29 NOTE — Telephone Encounter (Signed)
noted 

## 2022-05-29 NOTE — Telephone Encounter (Signed)
Pt stated unable to urinate pt stated feels pain and pressure.  Pt denied abdominal pain. Pt mentioned this started yesterday.    No appointments.   Pt seeking clinical advice.       Chief Complaint: UTI symptoms Symptoms: Dysuria, frequent urination, bladder area discomfort, lower back right sided pain "When stooping over." States "Feels like not voiding enough." Dribbling urine at times. H/O "Over active bladder." Frequency: Yesterday Pertinent Negatives: Patient denies fever Disposition: '[]'$ ED /'[]'$ Urgent Care (no appt availability in office) / '[x]'$ Appointment(In office/virtual)/ '[]'$  [place] [facility]/ '[]'$ Home Care/ '[]'$ Refused Recommended Disposition /'[]'$ [place] [facility]/ '[]'$  Follow-up with PCP Additional Notes: Appt secured for tomorrow AM with  advise to go to UC/E for worsening symptoms, decreased output. Pt then decided to go to UC. Appt cancelled. Reason for Disposition  Age > 50 years  Answer Assessment - Initial Assessment Questions 1. SEVERITY: "How bad is the pain?"  (e.g., Scale 1-10; mild, moderate, or severe)   - MILD (1-3): complains slightly about urination hurting   - MODERATE (4-7): interferes with normal activities     - SEVERE (8-10): excruciating, unwilling or unable to urinate because of the pain      "Little bit" 2. FREQUENCY: "How many times have you had painful urination today?"      Burning 3. PATTERN: "Is pain present every time you urinate or just sometimes?"      Each time 4. ONSET: "When did the painful urination start?"      Yesterday, better throughout day, now back 5. FEVER: "Do you have a fever?" If Yes, ask: "What is your temperature, how was it measured, and when did it start?"     no 6. PAST UTI: "Have you had a urine infection before?" If Yes, ask: "When was the last time?" and "What happened that time?"      Yes 7. CAUSE: "What do you think is causing the painful urination?"  (e.g., UTI, scratch, Herpes sore)     UTI 8. OTHER SYMPTOMS:  "Do you have any other symptoms?" (e.g., blood in urine, flank pain, genital sores, urgency, vaginal discharge)     "Feels like I really have to go, dribbling" at times lower right sided pain "When I stoop over." Only go a little bit when I go.  Protocols used: Urination Pain - Female-A-AH

## 2022-05-30 ENCOUNTER — Ambulatory Visit: Payer: PPO | Admitting: Physician Assistant

## 2022-07-10 DIAGNOSIS — E669 Obesity, unspecified: Secondary | ICD-10-CM | POA: Diagnosis not present

## 2022-07-10 DIAGNOSIS — H02834 Dermatochalasis of left upper eyelid: Secondary | ICD-10-CM | POA: Diagnosis not present

## 2022-07-10 DIAGNOSIS — H57813 Brow ptosis, bilateral: Secondary | ICD-10-CM | POA: Diagnosis not present

## 2022-07-10 DIAGNOSIS — H02831 Dermatochalasis of right upper eyelid: Secondary | ICD-10-CM | POA: Diagnosis not present

## 2022-07-10 DIAGNOSIS — H0231 Blepharochalasis right upper eyelid: Secondary | ICD-10-CM | POA: Diagnosis not present

## 2022-07-10 DIAGNOSIS — H0234 Blepharochalasis left upper eyelid: Secondary | ICD-10-CM | POA: Diagnosis not present

## 2022-07-24 DIAGNOSIS — N3281 Overactive bladder: Secondary | ICD-10-CM | POA: Diagnosis not present

## 2022-07-24 DIAGNOSIS — N3941 Urge incontinence: Secondary | ICD-10-CM | POA: Diagnosis not present

## 2022-08-12 DIAGNOSIS — L814 Other melanin hyperpigmentation: Secondary | ICD-10-CM | POA: Diagnosis not present

## 2022-08-12 DIAGNOSIS — L821 Other seborrheic keratosis: Secondary | ICD-10-CM | POA: Diagnosis not present

## 2022-08-12 DIAGNOSIS — D225 Melanocytic nevi of trunk: Secondary | ICD-10-CM | POA: Diagnosis not present

## 2022-08-13 ENCOUNTER — Telehealth: Payer: Self-pay

## 2022-08-13 ENCOUNTER — Ambulatory Visit (INDEPENDENT_AMBULATORY_CARE_PROVIDER_SITE_OTHER): Payer: PPO

## 2022-08-13 DIAGNOSIS — Z0000 Encounter for general adult medical examination without abnormal findings: Secondary | ICD-10-CM

## 2022-08-13 NOTE — Patient Instructions (Signed)

## 2022-08-13 NOTE — Progress Notes (Signed)
I connected with  [name] on 08/13/22 by a audio enabled telemedicine application and verified that I am speaking with the correct person using two identifiers.  Patient Location: Home  Provider Location: Office/Clinic  I discussed the limitations of evaluation and management by telemedicine. The patient expressed understanding and agreed to proceed.   Subjective:   [name] is a 81 y.o. female who presents for Medicare Annual (Subsequent) preventive examination.  Review of Systems    Per HPI unless specifically indicated below.  Cardiac Risk Factors include: advanced age (>28mn, >>65women);female gender, and hyperlipidemia.          Objective:       09/18/2021   11:36 AM 09/03/2021    9:17 AM 09/03/2021    8:55 AM  Vitals with BMI  Height   '5\' 2"'$   Weight   184 lbs 13 oz  BMI   323.53 Systolic [id number] Diastolic 76 80 65  Pulse   68    There were no vitals filed for this visit. There is no height or weight on file to calculate BMI.     12/15/2020   10:16 AM 06/16/2019    5:44 AM 06/11/2019   10:18 AM 09/16/2017    6:16 PM 09/09/2017    1:51 PM 06/24/2016    8:21 AM  Advanced Directives  Does Patient Have a Medical Advance Directive? Yes Yes Yes Yes Yes No  Type of AParamedicof [place]endenin[place] will Living will  [place] will Living will   Does patient want to make changes to medical advance directive?  No - Patient declined No - Patient declined No - Patient declined No - Patient declined   Copy of [place] Chart?    No - copy requested    Would patient like information on creating a medical advance directive?      No - patient declined information    Current Medications (verified) Outpatient Encounter Medications as of 08/13/2022  Medication Sig   aspirin EC 81 MG tablet Take 81 mg by mouth daily.   Calcium Carb-Cholecalciferol 600-800 MG-UNIT TABS Take 1 tablet by mouth 2  (two) times daily.    cetirizine (ZYRTEC) 10 MG tablet Take 10 mg by mouth daily.   esomeprazole (NEXIUM) 20 MG capsule Take 20 mg by mouth daily before breakfast.    fluticasone (FLONASE) 50 MCG/ACT nasal spray Place 2 sprays into the nose at bedtime.    Glucosamine-Chondroitin (COSAMIN DS PO) Take 1 tablet by mouth 2 (two) times daily.   Multiple Vitamins-Minerals (MULTIVITAMIN WITH MINERALS) tablet Take 1 tablet by mouth every evening. Women's One-A-Day   Omega-3 Fatty Acids (FISH OIL) 1000 MG CAPS Take 1,000 mg by mouth daily.   TURMERIC PO Take 538 mg by mouth daily.   [DISCONTINUED] MYRBETRIQ 50 MG TB24 tablet Take 50 mg by mouth daily.   [DISCONTINUED] prednisoLONE 5 MG TABS tablet Take 1 tablet (5 mg total) by mouth daily.   No facility-administered encounter medications on file as of 08/13/2022.    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 Social History   Socioeconomic History   Marital status: Married    Spouse name: See Beharry   Number of  children: 2   Years of education: Not on file   Highest education level: Not on file  Occupational History   Occupation: Retired  Tobacco Use   Smoking status: Former    Types: Cigarettes    Quit date: 12/28/1986    Years since quitting: 35.6   Smokeless tobacco: Never  Vaping Use   Vaping Use: Never used  Substance and Sexual Activity   Alcohol use: No   Drug use: No   Sexual activity: Not Currently  Other Topics Concern   Not on file  Social History Narrative   Not on file   Social Determinants of Health   Financial Resource Strain: Low Risk  (08/13/2022)   Overall Financial Resource Strain (CARDIA)    Difficulty of Paying Living Expenses: Not hard at all  Food Insecurity: No Food Insecurity (08/13/2022)   Hunger Vital Sign    Worried About Running Out of Food in the Last Year: Never true    [place] in the Last Year: Never true  Transportation Needs: No Transportation Needs (08/13/2022)   PRAPARE - Hydrologist (Medical): No    Lack of Transportation (Non-Medical): No  Physical Activity: Sufficiently Active (08/13/2022)   Exercise Vital Sign    Days of Exercise per Week: 7 days    Minutes of Exercise per Session: 30 min  Stress: No Stress Concern Present (08/13/2022)   [place]    Feeling of Stress : Not at all  Social Connections: [place] (08/13/2022)   Social Connection and Isolation Panel [NHANES]    Frequency of Communication with Friends and Family: More than three times a week    Frequency of Social Gatherings with Friends and Family: More than three times a week    Attends Religious Services: More than 4 times per year    Active Member of [employer] or Organizations: Yes    Attends Music therapist: Not on file    Marital Status: Married    Tobacco Counseling Counseling given: No   Clinical Intake:  Pre-visit preparation completed:  No  Pain : No/denies pain     Nutritional Status: BMI of 19-24  Normal Diabetes: No  How often do you need to have someone help you when you read instructions, pamphlets, or other written materials from your doctor or pharmacy?: 1 - Never  Diabetic?No      Information entered by :: Donnie Mesa, CMA   Activities of Daily Living    08/13/2022    2:26 PM 09/03/2021    8:58 AM  In your present state of health, do you have  any difficulty performing the following activities:  Hearing? 0 0  Vision? [place]   Difficulty concentrating or making decisions? 0 0  Walking or climbing stairs? 1 0  Dressing or bathing? 0 0  Doing errands, shopping? 0 0    Patient Care Team: Jearld Fenton, NP as PCP - General (Internal Medicine)  Indicate any recent Medical Services you may have received from other than [facility] providers in the past year (date may be approximate).    No hospitalization in the past 12 months. Assessment:   This is a routine wellness examination for [name].  Hearing/Vision screen Denies ay hearing issues. Wear glasses, denies any new vision issues. Annual Eye Exam scheduled next month at [facility].   Dietary issues and exercise activities discussed: Current Exercise Habits: Structured exercise class, Type of exercise: walking, Time (Minutes): 30, Frequency (Times/Week): 7, Weekly Exercise (Minutes/Week): 210, Intensity: Mild   Goals Addressed   None   Depression Screen    09/03/2021    8:57 AM 08/22/2020   11:43 AM 07/29/2019   10:50 AM 07/16/2018    8:23 AM 07/14/2017    2:25 PM 06/24/2016    8:17 AM 02/26/2016   10:30 AM  PHQ 2/9 Scores  PHQ - 2 Score 0 0 0 0 0 0 0  PHQ- 9 Score 0          Fall Risk    08/13/2022    2:25 PM 09/03/2021    8:58 AM 08/22/2020   11:42 AM 05/02/2020    1:45 PM 07/29/2019   10:47 AM  [place] in the past year? 0 0 1 0 0  Comment    Emmi Telephone Survey: data to providers  prior to load   Number falls in past yr: 0 0 0  0  Injury with Fall? 0 0 1    Risk for fall due to : No Fall Risks No Fall Risks     Follow up Falls evaluation completed Falls evaluation completed       [place]:  Any stairs in or around the home? Yes  If so, are there any without handrails? Yes  Home free of loose throw rugs in walkways, pet beds, electrical cords, etc? Yes  Adequate lighting in your home to reduce risk of falls? Yes   ASSISTIVE DEVICES UTILIZED TO PREVENT FALLS:  Life alert? No  Use of a cane, walker or w/c? No  Grab bars in the bathroom? No  Shower chair or bench in shower? No  Elevated toilet seat or a handicapped toilet? No   TIMED UP AND GO:  Was the test performed?  No, virtual appt .  Cognitive Function:        08/13/2022    2:27 PM  6CIT Screen  What Year? 0 points  What month? 0 points  What time? 0 points  Count back from 20 2 points  Months in reverse 0 points  Repeat phrase 10 points  Total Score 12 points    Immunizations Immunization History  Administered Date(s) Administered   Fluad Quad(high Dose 65+) 09/03/2021   Influenza,inj,Quad PF,6+ Mos 06/28/2013, 07/13/2014, 07/03/2015, 06/24/2016, 07/14/2017, 07/16/2018, 07/29/2019, 08/22/2020   Moderna Covid-19 Vaccine Bivalent Booster 73yr & up 07/16/2021   Moderna SARS-COV2 Booster Vaccination 07/08/2022   Moderna Sars-Covid-2 Vaccination 08/07/2020   PFIZER SARS-COV-2 Pediatric Vaccination 5-168yr02/11/2019, 12/10/2019   Pneumococcal Conjugate-13 07/13/2014   Pneumococcal  Polysaccharide-23 07/09/2007   Td 08/03/2012   Zoster Recombinat (Shingrix) 08/11/2018, 11/12/2018   Zoster, Live 07/21/2008    TDAP status: Due, Education has been provided regarding the importance of this vaccine. Advised may receive this vaccine at local pharmacy or Health Dept. Aware to provide a copy of the vaccination record if obtained from local pharmacy or Health Dept.  Verbalized acceptance and understanding.  Flu Vaccine status: Due, Education has been provided regarding the importance of this vaccine. Advised may receive this vaccine at local pharmacy or Health Dept. Aware to provide a copy of the vaccination record if obtained from local pharmacy or Health Dept. Verbalized acceptance and understanding.  Pneumococcal vaccine status: Up to date  Covid-19 vaccine status: Completed vaccines  Qualifies for Shingles Vaccine? Yes   Zostavax completed Yes   Shingrix Completed?: Yes  Screening Tests Health Maintenance  Topic Date Due   INFLUENZA VACCINE  05/07/2022   TETANUS/TDAP  08/03/2022   COVID-19 Vaccine (3 - Moderna series) 11/08/2022   Medicare Annual Wellness (AWV)  08/14/2023   Pneumonia Vaccine 67+ Years old  Completed   DEXA SCAN  Completed   Zoster Vaccines- Shingrix  Completed   HPV VACCINES  Aged Out    Health Maintenance  Health Maintenance Due  Topic Date Due   INFLUENZA VACCINE  05/07/2022   TETANUS/TDAP  08/03/2022    Colorectal cancer screening: No longer required.   Mammogram status: No longer required due to age.  DEXA Scan: 01/03/2021  Lung Cancer Screening: (Low Dose CT Chest recommended if Age 43-80 years, 30 pack-year currently smoking OR have quit w/in 15years.) does not qualify.     Additional Screening:  Hepatitis C Screening: does not qualify;   Vision Screening: Recommended annual ophthalmology exams for early detection of glaucoma and other disorders of the eye. Is the patient up to date with their annual eye exam?  Yes  Who is the provider or what is the name of the office in which the patient attends annual eye exams? [facility]  If pt is not established with a provider, would they like to be referred to a provider to establish care? No .   Dental Screening: Recommended annual dental exams for proper oral hygiene  Community Resource Referral / Chronic Care Management: CRR required this  visit?  No   CCM required this visit?  No      Plan:     I have personally reviewed and noted the following in the patient's chart:   Medical and social history Use of alcohol, tobacco or illicit drugs  Current medications and supplements including opioid prescriptions. Patient is not currently taking opioid prescriptions. Functional ability and status Nutritional status Physical activity Advanced directives List of other physicians Hospitalizations, surgeries, and ER visits in previous 12 months Vitals Screenings to include cognitive, depression, and falls Referrals and appointments  In addition, I have reviewed and discussed with patient certain preventive protocols, quality metrics, and best practice recommendations. A written personalized care plan for preventive services as well as general preventive health recommendations were provided to patient.     Wilson Singer, CMA   08/13/2022   Nurse Notes: Approximately 30 Minute Non-Face-to-Face Visit

## 2022-08-13 NOTE — Telephone Encounter (Signed)
I left a message for the patient to call back and schedule their Medicare Annual Wellness Visit (AWV) virtually, by telephone, or face-to-face.   Katelyne Galster, CMA [phone]  

## 2022-08-19 DIAGNOSIS — H2513 Age-related nuclear cataract, bilateral: Secondary | ICD-10-CM | POA: Diagnosis not present

## 2022-08-19 DIAGNOSIS — H16223 Keratoconjunctivitis sicca, not specified as Sjogren's, bilateral: Secondary | ICD-10-CM | POA: Diagnosis not present

## 2022-08-22 DIAGNOSIS — N3281 Overactive bladder: Secondary | ICD-10-CM | POA: Diagnosis not present

## 2022-09-06 ENCOUNTER — Encounter: Payer: Self-pay | Admitting: Internal Medicine

## 2022-09-06 ENCOUNTER — Ambulatory Visit (INDEPENDENT_AMBULATORY_CARE_PROVIDER_SITE_OTHER): Payer: PPO | Admitting: Internal Medicine

## 2022-09-06 VITALS — BP 138/84 | HR 72 | Temp 96.8°F | Ht 61.0 in | Wt 187.0 lb

## 2022-09-06 DIAGNOSIS — Z0001 Encounter for general adult medical examination with abnormal findings: Secondary | ICD-10-CM | POA: Diagnosis not present

## 2022-09-06 DIAGNOSIS — Z23 Encounter for immunization: Secondary | ICD-10-CM

## 2022-09-06 DIAGNOSIS — R7303 Prediabetes: Secondary | ICD-10-CM | POA: Diagnosis not present

## 2022-09-06 DIAGNOSIS — Z1231 Encounter for screening mammogram for malignant neoplasm of breast: Secondary | ICD-10-CM

## 2022-09-06 DIAGNOSIS — Z6835 Body mass index (BMI) 35.0-35.9, adult: Secondary | ICD-10-CM | POA: Diagnosis not present

## 2022-09-06 DIAGNOSIS — E785 Hyperlipidemia, unspecified: Secondary | ICD-10-CM | POA: Diagnosis not present

## 2022-09-06 NOTE — Patient Instructions (Signed)
Health Maintenance for Postmenopausal Women Menopause is a normal process in which your ability to get pregnant comes to an end. This process happens slowly over many months or years, usually between the ages of 48 and 55. Menopause is complete when you have missed your menstrual period for 12 months. It is important to talk with your health care provider about some of the most common conditions that affect women after menopause (postmenopausal women). These include heart disease, cancer, and bone loss (osteoporosis). Adopting a healthy lifestyle and getting preventive care can help to promote your health and wellness. The actions you take can also lower your chances of developing some of these common conditions. What are the signs and symptoms of menopause? During menopause, you may have the following symptoms: Hot flashes. These can be moderate or severe. Night sweats. Decrease in sex drive. Mood swings. Headaches. Tiredness (fatigue). Irritability. Memory problems. Problems falling asleep or staying asleep. Talk with your health care provider about treatment options for your symptoms. Do I need hormone replacement therapy? Hormone replacement therapy is effective in treating symptoms that are caused by menopause, such as hot flashes and night sweats. Hormone replacement carries certain risks, especially as you become older. If you are thinking about using estrogen or estrogen with progestin, discuss the benefits and risks with your health care provider. How can I reduce my risk for heart disease and stroke? The risk of heart disease, heart attack, and stroke increases as you age. One of the causes may be a change in the body's hormones during menopause. This can affect how your body uses dietary fats, triglycerides, and cholesterol. Heart attack and stroke are medical emergencies. There are many things that you can do to help prevent heart disease and stroke. Watch your blood pressure High  blood pressure causes heart disease and increases the risk of stroke. This is more likely to develop in people who have high blood pressure readings or are overweight. Have your blood pressure checked: Every 3-5 years if you are 18-39 years of age. Every year if you are 40 years old or older. Eat a healthy diet  Eat a diet that includes plenty of vegetables, fruits, low-fat dairy products, and lean protein. Do not eat a lot of foods that are high in solid fats, added sugars, or sodium. Get regular exercise Get regular exercise. This is one of the most important things you can do for your health. Most adults should: Try to exercise for at least 150 minutes each week. The exercise should increase your heart rate and make you sweat (moderate-intensity exercise). Try to do strengthening exercises at least twice each week. Do these in addition to the moderate-intensity exercise. Spend less time sitting. Even light physical activity can be beneficial. Other tips Work with your health care provider to achieve or maintain a healthy weight. Do not use any products that contain nicotine or tobacco. These products include cigarettes, chewing tobacco, and vaping devices, such as e-cigarettes. If you need help quitting, ask your health care provider. Know your numbers. Ask your health care provider to check your cholesterol and your blood sugar (glucose). Continue to have your blood tested as directed by your health care provider. Do I need screening for cancer? Depending on your health history and family history, you may need to have cancer screenings at different stages of your life. This may include screening for: Breast cancer. Cervical cancer. Lung cancer. Colorectal cancer. What is my risk for osteoporosis? After menopause, you may be   at increased risk for osteoporosis. Osteoporosis is a condition in which bone destruction happens more quickly than new bone creation. To help prevent osteoporosis or  the bone fractures that can happen because of osteoporosis, you may take the following actions: If you are 19-50 years old, get at least 1,000 mg of calcium and at least 600 international units (IU) of vitamin D per day. If you are older than age 50 but younger than age 70, get at least 1,200 mg of calcium and at least 600 international units (IU) of vitamin D per day. If you are older than age 70, get at least 1,200 mg of calcium and at least 800 international units (IU) of vitamin D per day. Smoking and drinking excessive alcohol increase the risk of osteoporosis. Eat foods that are rich in calcium and vitamin D, and do weight-bearing exercises several times each week as directed by your health care provider. How does menopause affect my mental health? Depression may occur at any age, but it is more common as you become older. Common symptoms of depression include: Feeling depressed. Changes in sleep patterns. Changes in appetite or eating patterns. Feeling an overall lack of motivation or enjoyment of activities that you previously enjoyed. Frequent crying spells. Talk with your health care provider if you think that you are experiencing any of these symptoms. General instructions See your health care provider for regular wellness exams and vaccines. This may include: Scheduling regular health, dental, and eye exams. Getting and maintaining your vaccines. These include: Influenza vaccine. Get this vaccine each year before the flu season begins. Pneumonia vaccine. Shingles vaccine. Tetanus, diphtheria, and pertussis (Tdap) booster vaccine. Your health care provider may also recommend other immunizations. Tell your health care provider if you have ever been abused or do not feel safe at home. Summary Menopause is a normal process in which your ability to get pregnant comes to an end. This condition causes hot flashes, night sweats, decreased interest in sex, mood swings, headaches, or lack  of sleep. Treatment for this condition may include hormone replacement therapy. Take actions to keep yourself healthy, including exercising regularly, eating a healthy diet, watching your weight, and checking your blood pressure and blood sugar levels. Get screened for cancer and depression. Make sure that you are up to date with all your vaccines. This information is not intended to replace advice given to you by your health care provider. Make sure you discuss any questions you have with your health care provider. Document Revised: 02/12/2021 Document Reviewed: 02/12/2021 Elsevier Patient Education  2023 [employer]  

## 2022-09-06 NOTE — Progress Notes (Signed)
Subjective:    Patient ID: [name], female    DOB: [birth year], 81 y.o.   MRN: [id number]  HPI  Patient presents to clinic today for annual exam.  Flu: 08/2021 Tetanus: 07/2012 COVID: X5 Pneumovax: 07/2007 Prevnar: 07/2014 Shingrix: 08/2018, 11/2018 Pap smear: No longer screening Mammogram: 10/2021 Bone density: 12/2020 Colon screening: 06/2021 Vision screening: annually Dentist: biannually  Diet: She does eat some meat. She consumes fruits and veggies. She does eat some fried foods. She drinks mostly coffee. Exercise: Walking   Review of Systems  Past Medical History:  Diagnosis Date   Arthritis    Cancer ([place])    History of chicken pox    History of colon polyps    History of mouth cancer    Osteoarthritis of knee    Medial compartment, Right    Current Outpatient Medications  Medication Sig Dispense Refill   aspirin EC 81 MG tablet Take 81 mg by mouth daily.     Calcium Carb-Cholecalciferol 600-800 MG-UNIT TABS Take 1 tablet by mouth 2 (two) times daily.      cetirizine (ZYRTEC) 10 MG tablet Take 10 mg by mouth daily.     esomeprazole (NEXIUM) 20 MG capsule Take 20 mg by mouth daily before breakfast.      fluticasone (FLONASE) 50 MCG/ACT nasal spray Place 2 sprays into the nose at bedtime.      Glucosamine-Chondroitin (COSAMIN DS PO) Take 1 tablet by mouth 2 (two) times daily.     Multiple Vitamins-Minerals (MULTIVITAMIN WITH MINERALS) tablet Take 1 tablet by mouth every evening. Women's One-A-Day     Omega-3 Fatty Acids (FISH OIL) 1000 MG CAPS Take 1,000 mg by mouth daily.     TURMERIC PO Take 538 mg by mouth daily.     No current facility-administered medications for this visit.    Allergies  Allergen Reactions   Ibuprofen Nausea And Vomiting and Other (See Comments)    GI upset, GERD   Azithromycin Hives    Itching, blotchy rash     No family history on file.  Social History   Socioeconomic History   Marital status: Married    Spouse name:  Shalona Harbour   Number of children: 2   Years of education: Not on file   Highest education level: Not on file  Occupational History   Occupation: Retired  Tobacco Use   Smoking status: Former    Types: Cigarettes    Quit date: 12/28/1986    Years since quitting: 35.7   Smokeless tobacco: Never  Vaping Use   Vaping Use: Never used  Substance and Sexual Activity   Alcohol use: No   Drug use: No   Sexual activity: Not Currently  Other Topics Concern   Not on file  Social History Narrative   Not on file   Social Determinants of Health   Financial Resource Strain: Low Risk  (08/13/2022)   Overall Financial Resource Strain (CARDIA)    Difficulty of Paying Living Expenses: Not hard at all  Food Insecurity: No Food Insecurity (08/13/2022)   Hunger Vital Sign    Worried About Running Out of Food in the Last Year: Never true    [place] in the Last Year: Never true  Transportation Needs: No Transportation Needs (08/13/2022)   PRAPARE - Hydrologist (Medical): No    Lack of Transportation (Non-Medical): No  Physical Activity: Sufficiently Active (08/13/2022)   Exercise Vital Sign  Days of Exercise per Week: 7 days    Minutes of Exercise per Session: 30 min  Stress: No Stress Concern Present (08/13/2022)   [place]    Feeling of Stress : Not at all  Social Connections: [place] (08/13/2022)   Social Connection and Isolation Panel [NHANES]    Frequency of Communication with Friends and Family: More than three times a week    Frequency of Social Gatherings with Friends and Family: More than three times a week    Attends Religious Services: More than 4 times per year    Active Member of [employer] or Organizations: Yes    Attends Archivist Meetings: Not on file    Marital Status: Married  Intimate Partner Violence: Not At Risk (08/13/2022)   Humiliation, Afraid,  Rape, and Kick questionnaire    Fear of Current or Ex-Partner: No    Emotionally Abused: No    Physically Abused: No    Sexually Abused: No     Constitutional: Denies fever, malaise, fatigue, headache or abrupt weight changes.  HEENT: Denies eye pain, eye redness, ear pain, ringing in the ears, wax buildup, runny nose, nasal congestion, bloody nose, or sore throat. Respiratory: Denies difficulty breathing, shortness of breath, cough or sputum production.   Cardiovascular: Denies chest pain, chest tightness, palpitations or swelling in the hands or feet.  Gastrointestinal: Denies abdominal pain, bloating, constipation, diarrhea or blood in the stool.  GU: Pt reports urinary frequency. Denies urgency, pain with urination, burning sensation, blood in urine, odor or discharge. Musculoskeletal: Patient reports joint pain.  Denies decrease in range of motion, difficulty with gait, muscle pain or joint swelling.  Skin: Denies redness, rashes, lesions or ulcercations.  Neurological: Denies dizziness, difficulty with memory, difficulty with speech or problems with balance and coordination.  Psych: Denies anxiety, depression, SI/HI.  No other specific complaints in a complete review of systems (except as listed in HPI above).     Objective:   Physical Exam  BP 138/84 (BP Location: Right Arm, Patient Position: Sitting, Cuff Size: Normal)   Pulse 72   Temp (!) 96.8 F (36 C) (Temporal)   Ht _0  (1.549 m)   Wt 187 lb (84.8 kg)   LMP  (LMP Unknown)   SpO2 98%   BMI 35.33 kg/m   Wt Readings from Last 3 Encounters:  09/03/21 184 lb 12.8 oz (83.8 kg)  09/18/20 180 lb (81.6 kg)  08/22/20 180 lb (81.6 kg)    General: Appears her stated age, obese, in NAD. Skin: Warm, dry and intact.  HEENT: Head: normal shape and size; Eyes: sclera white, no icterus, conjunctiva pink, PERRLA and EOMs intact;  Neck:  Neck supple, trachea midline. No masses, lumps or thyromegaly present.  Cardiovascular:  Normal rate and rhythm. S1,S2 noted.  No murmur, rubs or gallops noted. No JVD or BLE edema. No carotid bruits noted. Pulmonary/Chest: Normal effort and positive vesicular breath sounds. No respiratory distress. No wheezes, rales or ronchi noted.  Abdomen: Normal bowel sounds.  Musculoskeletal: Strength 5/5 BUE/BLE. No difficulty with gait.  Neurological: Alert and oriented. Cranial nerves II-XII grossly intact. Coordination normal.  Psychiatric: Mood and affect normal. Behavior is normal. Judgment and thought content normal.    BMET    Component Value Date/Time   NA 142 09/03/2021 0917   K 4.2 09/03/2021 0917   CL 106 09/03/2021 0917   CO2 27 09/03/2021 0917   GLUCOSE 101 (H) 09/03/2021  0917   BUN 19 09/03/2021 0917   CREATININE 0.72 09/03/2021 0917   CALCIUM 8.9 09/03/2021 0917   GFRNONAA >60 06/16/2019 0337   GFRNONAA >89 06/24/2016 0853   GFRAA >60 06/16/2019 0337   GFRAA >89 06/24/2016 0853    Lipid Panel     Component Value Date/Time   CHOL 198 09/03/2021 0917   TRIG 111 09/03/2021 0917   HDL 52 09/03/2021 0917   CHOLHDL 3.8 09/03/2021 0917   VLDL 30.4 08/22/2020 1211   LDLCALC 124 (H) 09/03/2021 0917    CBC    Component Value Date/Time   WBC 5.9 09/03/2021 0917   RBC 4.90 09/03/2021 0917   HGB 14.7 09/03/2021 0917   HCT 43.7 09/03/2021 0917   PLT 174 09/03/2021 0917   MCV 89.2 09/03/2021 0917   MCV 86.6 06/24/2016 0909   MCH 30.0 09/03/2021 0917   MCHC 33.6 09/03/2021 0917   RDW 13.0 09/03/2021 0917    Hgb A1C Lab Results  Component Value Date   HGBA1C 5.8 (H) 09/03/2021            Assessment & Plan:   Preventative Health Maintenance:  Flu shot today She declines tetanus for financial reasons, advised if she gets bit or cut to have this done Prevnar 20 today Shingrix UTD Encouraged her to get her COVID booster She no longer needs to screen for cervical cancer Mammogram ordered-she will call to schedule Bone density UTD Colon screening  UTD Encourage her to consume a balanced diet and exercise regimen Advised her to see an eye doctor and dentist annually We will check CBC, c-Met, lipid and A1c today  RTC in 6 months, follow-up chronic conditions Webb Silversmith, NP

## 2022-09-06 NOTE — Assessment & Plan Note (Signed)
Encourage diet and exercise for weight loss 

## 2022-09-07 LAB — LIPID PANEL
Cholesterol: 200 mg/dL — ABNORMAL HIGH (ref <200)
HDL: 52 mg/dL (ref >=50)
LDL Cholesterol (Calc): 116 mg/dL (calc) — ABNORMAL HIGH
Non-HDL Cholesterol (Calc): 148 mg/dL (calc) — ABNORMAL HIGH (ref <130)
Total CHOL/HDL Ratio: 3.8 (calc) (ref <5.0)
Triglycerides: 197 mg/dL — ABNORMAL HIGH (ref <150)

## 2022-09-07 LAB — CBC
HCT: 45 % (ref 35.0–45.0)
Hemoglobin: 15 g/dL (ref 11.7–15.5)
MCH: 30.1 pg (ref 27.0–33.0)
MCHC: 33.3 g/dL (ref 32.0–36.0)
MCV: 90.2 fL (ref 80.0–100.0)
MPV: 10.4 fL (ref 7.5–12.5)
Platelets: 181 10*3/uL (ref 140–400)
RBC: 4.99 10*6/uL (ref 3.80–5.10)
RDW: 13.1 % (ref 11.0–15.0)
WBC: 6.4 10*3/uL (ref 3.8–10.8)

## 2022-09-07 LAB — COMPLETE METABOLIC PANEL WITH GFR
AG Ratio: 1.4 (calc) (ref 1.0–2.5)
ALT: 18 U/L (ref 6–29)
AST: 19 U/L (ref 10–35)
Albumin: 4.2 g/dL (ref 3.6–5.1)
Alkaline phosphatase (APISO): 68 U/L (ref 37–153)
BUN: 19 mg/dL (ref 7–25)
CO2: 27 mmol/L (ref 20–32)
Calcium: 9.4 mg/dL (ref 8.6–10.4)
Chloride: 103 mmol/L (ref 98–110)
Creat: 0.73 mg/dL (ref 0.60–0.95)
Globulin: 3 g/dL (calc) (ref 1.9–3.7)
Glucose, Bld: 100 mg/dL — ABNORMAL HIGH (ref 65–99)
Potassium: 4.5 mmol/L (ref 3.5–5.3)
Sodium: 141 mmol/L (ref 135–146)
Total Bilirubin: 0.5 mg/dL (ref 0.2–1.2)
Total Protein: 7.2 g/dL (ref 6.1–8.1)
eGFR: 83 mL/min/{1.73_m2} (ref >=60)

## 2022-09-07 LAB — HEMOGLOBIN A1C
Hgb A1c MFr Bld: 6 % of total Hgb — ABNORMAL HIGH (ref <5.7)
Mean Plasma Glucose: 126 mg/dL
eAG (mmol/L): 7 mmol/L

## 2022-09-10 ENCOUNTER — Telehealth: Payer: Self-pay | Admitting: Internal Medicine

## 2022-09-10 DIAGNOSIS — E782 Mixed hyperlipidemia: Secondary | ICD-10-CM

## 2022-09-10 NOTE — Telephone Encounter (Signed)
Pt received mychart message from [place] about her cholesterol and she is ok with starting the medication / she would like it sent to  [id number] [place], [place]   Please let pt know when this is sent

## 2022-09-11 MED ORDER — ATORVASTATIN CALCIUM 10 MG PO TABS: 10.0000 mg | ORAL_TABLET | Freq: Every day | ORAL | 1 refills | Status: DC

## 2022-09-11 NOTE — Telephone Encounter (Signed)
Atorvastatin sent to pharmacy.

## 2022-09-11 NOTE — Addendum Note (Signed)
Addended by: Jearld Fenton on: 09/11/2022 07:57 AM   Modules accepted: Orders

## 2022-09-12 DIAGNOSIS — N3000 Acute cystitis without hematuria: Secondary | ICD-10-CM | POA: Diagnosis not present

## 2022-09-12 DIAGNOSIS — N3946 Mixed incontinence: Secondary | ICD-10-CM | POA: Diagnosis not present

## 2022-09-12 DIAGNOSIS — N3281 Overactive bladder: Secondary | ICD-10-CM | POA: Diagnosis not present

## 2022-09-18 DIAGNOSIS — Z85819 Personal history of malignant neoplasm of unspecified site of lip, oral cavity, and pharynx: Secondary | ICD-10-CM | POA: Diagnosis not present

## 2022-09-18 DIAGNOSIS — H6123 Impacted cerumen, bilateral: Secondary | ICD-10-CM | POA: Diagnosis not present

## 2022-10-24 DIAGNOSIS — N3000 Acute cystitis without hematuria: Secondary | ICD-10-CM | POA: Diagnosis not present

## 2022-10-31 ENCOUNTER — Ambulatory Visit
Admission: RE | Admit: 2022-10-31 | Discharge: 2022-10-31 | Disposition: A | Discharge status: Home / Self Care | Payer: PPO | Source: Ambulatory Visit | Attending: Internal Medicine | Admitting: Internal Medicine

## 2022-10-31 DIAGNOSIS — Z1231 Encounter for screening mammogram for malignant neoplasm of breast: Secondary | ICD-10-CM

## 2022-10-31 DIAGNOSIS — H57813 Brow ptosis, bilateral: Secondary | ICD-10-CM | POA: Diagnosis not present

## 2022-12-16 DIAGNOSIS — N3946 Mixed incontinence: Secondary | ICD-10-CM | POA: Diagnosis not present

## 2023-01-06 ENCOUNTER — Ambulatory Visit (INDEPENDENT_AMBULATORY_CARE_PROVIDER_SITE_OTHER): Payer: PPO | Admitting: Family Medicine

## 2023-01-06 ENCOUNTER — Encounter: Payer: Self-pay | Admitting: Family Medicine

## 2023-01-06 VITALS — BP 134/72 | HR 73 | Ht 61.0 in | Wt 177.0 lb

## 2023-01-06 DIAGNOSIS — B372 Candidiasis of skin and nail: Secondary | ICD-10-CM | POA: Diagnosis not present

## 2023-01-06 MED ORDER — NYSTATIN 100000 UNIT/GM EX POWD: 1.0000 | Freq: Three times a day (TID) | CUTANEOUS | 1 refills | Status: DC

## 2023-01-06 MED ORDER — CLOTRIMAZOLE-BETAMETHASONE 1-0.05 % EX CREA: TOPICAL_CREAM | CUTANEOUS | 1 refills | Status: DC

## 2023-01-06 NOTE — Progress Notes (Signed)
Subjective:    Patient ID: [name], female    DOB: 10-27-1940, 82 y.o.   MRN: [id number]  [name] is a 82 y.o. female presenting on 01/06/2023 for Rash  Patient presents for a same day appointment.  PCP Webb Silversmith, FNP  HPI  Candidal Intertrigo Reported onset within 1 week with red irritated rash in skin fold in groin. She has had inc moisture in this area due to overactive bladder, and wearing pads. She has tried [employer] without relief. Has not had this particular rash previously. It does not hurt, but it feels irritated and itching. She denies any vaginal discharge. Denies any fever chills nausea vomiting spreading redness.      09/06/2022    8:21 AM 09/03/2021    8:57 AM 08/22/2020   11:43 AM  Depression screen PHQ 2/9  Decreased Interest 0 0 0  Down, Depressed, Hopeless 0 0 0  PHQ - 2 Score 0 0 0  Altered sleeping  0   Tired, decreased energy  0   Change in appetite  0   Feeling bad or failure about yourself   0   Trouble concentrating  0   Moving slowly or fidgety/restless  0   Suicidal thoughts  0   PHQ-9 Score  0   Difficult doing work/chores  Not difficult at all     Social History   Tobacco Use   Smoking status: Former    Types: Cigarettes    Quit date: 12/28/1986    Years since quitting: 36.0   Smokeless tobacco: Never  Vaping Use   Vaping Use: Never used  Substance Use Topics   Alcohol use: No   Drug use: No    Review of Systems Per HPI unless specifically indicated above     Objective:    BP 134/72   Pulse 73   Ht 5\' 1"  (1.549 m)   Wt 177 lb (80.3 kg)   LMP  (LMP Unknown)   SpO2 95%   BMI 33.44 kg/m   Wt Readings from Last 3 Encounters:  01/06/23 177 lb (80.3 kg)  09/06/22 187 lb (84.8 kg)  09/03/21 184 lb 12.8 oz (83.8 kg)    Physical Exam Vitals and nursing note reviewed.  Constitutional:      General: She is not in acute distress.    Appearance: Normal appearance. She is well-developed. She is not diaphoretic.      Comments: Well-appearing, comfortable, cooperative  HENT:     Head: Normocephalic and atraumatic.  Eyes:     General:        Right eye: No discharge.        Left eye: No discharge.     Conjunctiva/sclera: Conjunctivae normal.  Cardiovascular:     Rate and Rhythm: Normal rate.  Pulmonary:     Effort: Pulmonary effort is normal.  Skin:    General: Skin is warm and dry.     Findings: Rash (not visualized today, we agreed to defer sensitive genital exam today) present. No erythema.  Neurological:     Mental Status: She is alert and oriented to person, place, and time.  Psychiatric:        Mood and Affect: Mood normal.        Behavior: Behavior normal.        Thought Content: Thought content normal.     Comments: Well groomed, good eye contact, normal speech and thoughts    Results for orders placed or performed in  visit on 09/06/22  CBC  Result Value Ref Range   WBC 6.4 3.8 - 10.8 Thousand/uL   RBC 4.99 3.80 - 5.10 Million/uL   Hemoglobin 15.0 11.7 - 15.5 g/dL   HCT 45.0 35.0 - 45.0 %   MCV 90.2 80.0 - 100.0 fL   MCH 30.1 27.0 - 33.0 pg   MCHC 33.3 32.0 - 36.0 g/dL   RDW 13.1 11.0 - 15.0 %   Platelets 181 140 - 400 Thousand/uL   MPV 10.4 7.5 - 12.5 fL  COMPLETE METABOLIC PANEL WITH GFR  Result Value Ref Range   Glucose, Bld 100 (H) 65 - 99 mg/dL   BUN 19 7 - 25 mg/dL   Creat 0.73 0.60 - 0.95 mg/dL   eGFR 83 > OR = 60 mL/min/1.39m2   BUN/Creatinine Ratio SEE NOTE: 6 - 22 (calc)   Sodium 141 135 - 146 mmol/L   Potassium 4.5 3.5 - 5.3 mmol/L   Chloride 103 98 - 110 mmol/L   CO2 27 20 - 32 mmol/L   Calcium 9.4 8.6 - 10.4 mg/dL   Total Protein 7.2 6.1 - 8.1 g/dL   Albumin 4.2 3.6 - 5.1 g/dL   Globulin 3.0 1.9 - 3.7 g/dL (calc)   AG Ratio 1.4 1.0 - 2.5 (calc)   Total Bilirubin 0.5 0.2 - 1.2 mg/dL   Alkaline phosphatase (APISO) 68 37 - 153 U/L   AST 19 10 - 35 U/L   ALT 18 6 - 29 U/L  Lipid panel  Result Value Ref Range   Cholesterol 200 (H) <200 mg/dL   HDL 52 >  OR = 50 mg/dL   Triglycerides 197 (H) <150 mg/dL   LDL Cholesterol (Calc) 116 (H) mg/dL (calc)   Total CHOL/HDL Ratio 3.8 <5.0 (calc)   Non-HDL Cholesterol (Calc) 148 (H) <130 mg/dL (calc)  Hemoglobin A1c  Result Value Ref Range   Hgb A1c MFr Bld 6.0 (H) <5.7 % of total Hgb   Mean Plasma Glucose 126 mg/dL   eAG (mmol/L) 7.0 mmol/L      Assessment & Plan:   Problem List Items Addressed This Visit   None Visit Diagnoses     Candidal intertrigo    -  Primary   Relevant Medications   nystatin (MYCOSTATIN/NYSTOP) powder   clotrimazole-betamethasone (LOTRISONE) cream      Acute candidal intertrigo suspected based on history  Localized to bilateral under groin skin folds Due to excessive moisture, w history of overactive bladder  Start the Nystatin anti fungal powder 2 to 3 times per day until healed, may take 1 to 3+ weeks. Add the topical cream anti fungal + steroid to reduce inflammation, itching, redness, irritation. It is only OPTIONAL treatment, but can help as needed.  Keep dry, use pads and barrier ointment as needed  Follow-up 1-2 weeks if not improved, consider oral antifungal if need    Meds ordered this encounter  Medications   nystatin (MYCOSTATIN/NYSTOP) powder    Sig: Apply 1 Application topically 3 (three) times daily.    Dispense:  30 g    Refill:  1   clotrimazole-betamethasone (LOTRISONE) cream    Sig: Apply 1-2 times a day as needed for spot treatment for yeast skin rash    Dispense:  30 g    Refill:  1      Follow up plan: Return if symptoms worsen or fail to improve, for within 1-2 weeks if not responding for fungal intertrigo rash.  Nobie Putnam, DO [facility]  [facility] 01/06/2023, 11:02 AM

## 2023-01-06 NOTE — Patient Instructions (Addendum)
Thank you for coming to the office today.  Start the Nystatin anti fungal powder 2 to 3 times per day until healed, may take 1 to 3+ weeks.  Add the topical cream anti fungal + steroid to reduce inflammation, itching, redness, irritation. It is only OPTIONAL treatment, but can help as needed.  Try your best to keep the area dry as you.   Intertrigo Intertrigo is skin irritation or inflammation (dermatitis) that occurs when folds of skin rub together. The irritation can cause a rash and make skin raw and itchy. This condition mostly occurs in the skin folds of these areas: Armpits. Under the breasts. Under the abdomen. Groin. Buttocks. Toes. Intertrigo is not passed from person to person (is not contagious). What are the causes? This condition is caused by heat, moisture, rubbing (friction), and not enough air circulation. The condition can be made worse by: Sweat. Bacteria. A fungus, such as yeast. What increases the risk? This condition is more likely to occur if you have moisture in your skin folds. You are more likely to develop this condition if you: Are not able to move around or are not active. Live in a warm and moist climate. Are not able to control your bowels or bladder (have incontinence). Wear splints, braces, or other medical devices. Are overweight. Have diabetes. What are the signs or symptoms? Symptoms of this condition include: A pink or red skin rash in a skin fold or near a skin fold. Raw or scaly skin. Itchiness or burning. Bleeding. Leaking fluid. A bad smell. How is this diagnosed? This condition is diagnosed with a medical history and physical exam. You may also have a skin swab to test for bacteria or fungus. How is this treated? This condition may be treated by: Cleaning and drying your skin. Taking an antibiotic medicine or using an antibiotic skin cream for a bacterial infection. Using an antifungal cream on your skin or taking pills for an  infection that was caused by a fungus, such as yeast. Using a steroid ointment to relieve itchiness and irritation. Separating the skin fold with a clean cotton cloth to absorb moisture and allow air to flow into the area. Follow these instructions at home: Keep the affected area clean and dry. Do not scratch your skin. Stay in a cool environment as much as possible. Use an air conditioner or fan, if available. Apply over-the-counter and prescription medicines only as told by your health care provider. If you were prescribed antibiotics, use them as told by your health care provider. Do not stop using the antibiotic even if you start to feel better. Keep all follow-up visits. Your health care provider may need to check how well your skin is responding to the treatment. How is this prevented? Shower and dry yourself well after activity or exercise. Use a hair dryer on a cool setting to dry between skin folds, especially after you bathe. Do not wear tight clothes. Wear clothes that are loose, absorbent, and made of cotton. Wear a bra that gives good support, if needed. Protect the skin around your groin and buttocks, especially if you have incontinence. Skin protection includes: Following a regular cleaning routine. Using skin protectant creams, powders, or ointments. Changing protection pads frequently. Maintain a healthy weight. Take care of your feet, especially if you have diabetes. Foot care includes: Wearing shoes that fit well. Keeping your feet dry. Wearing clean, breathable socks. If you have diabetes, keep your blood sugar under control. Contact a health care  provider if: Your symptoms do not improve with treatment. Your symptoms get worse or they spread. You notice increased redness and warmth. You have a fever. This information is not intended to replace advice given to you by your health care provider. Make sure you discuss any questions you have with your health care  provider. Document Revised: 02/14/2022 Document Reviewed: 02/14/2022 Elsevier Patient Education  [place].     Please schedule a Follow-up Appointment to: Return if symptoms worsen or fail to improve, for within 1-2 weeks if not responding for fungal intertrigo rash.  If you have any other questions or concerns, please feel free to call the office or send a message through [facility]. You may also schedule an earlier appointment if necessary.  Additionally, you may be receiving a survey about your experience at our office within a few days to 1 week by e-mail or mail. We value your feedback.  Nobie Putnam, DO [place]

## 2023-01-24 ENCOUNTER — Other Ambulatory Visit (HOSPITAL_BASED_OUTPATIENT_CLINIC_OR_DEPARTMENT_OTHER): Payer: Self-pay | Admitting: Student

## 2023-01-24 ENCOUNTER — Encounter (HOSPITAL_BASED_OUTPATIENT_CLINIC_OR_DEPARTMENT_OTHER): Payer: Self-pay | Admitting: Student

## 2023-01-24 ENCOUNTER — Ambulatory Visit (INDEPENDENT_AMBULATORY_CARE_PROVIDER_SITE_OTHER): Payer: PPO | Admitting: Student

## 2023-01-24 ENCOUNTER — Ambulatory Visit (HOSPITAL_BASED_OUTPATIENT_CLINIC_OR_DEPARTMENT_OTHER): Payer: PPO

## 2023-01-24 DIAGNOSIS — M25562 Pain in left knee: Secondary | ICD-10-CM

## 2023-01-24 DIAGNOSIS — S76312A Strain of muscle, fascia and tendon of the posterior muscle group at thigh level, left thigh, initial encounter: Secondary | ICD-10-CM | POA: Diagnosis not present

## 2023-01-24 NOTE — Progress Notes (Signed)
Chief Complaint: Left knee pain     History of Present Illness:    [name] is a 82 y.o. female presenting for evaluation of left knee pain.  This began yesterday when she felt a sharp pain in the back of her knee while twisting in order to turn around.  Her pain after the injury was a 6/10 however has improved some since yesterday.  She does have a history of a left meniscal repair which she reports was approximately 10 years ago.  She has tried Voltaren gel but has not noticed any benefit to this point.    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   KNEE ARTHROSCOPY W/ MENISCAL REPAIR Left    PARTIAL KNEE ARTHROPLASTY Right 09/16/2017   PARTIAL KNEE ARTHROPLASTY Right 09/16/2017   Procedure: RIGHT UNICOMPARTMENTAL KNEE;  Surgeon: Kathryne Hitch, MD;  Location: [facility];  Service: Orthopedics;  Laterality: Right;   SHOULDER ARTHROSCOPY Left 11/2018   TOTAL KNEE REVISION Right 06/15/2019   Procedure: RIGHT UNICOMPARTMENTAL ARTHROPLASTY TO TOTAL KNEE ARTHROPLASTY;  Surgeon: Kathryne Hitch, MD;  Location: [facility] OR;  Service: Orthopedics;  Laterality: Right;   TUBAL LIGATION     Social History   Socioeconomic History   Marital status: Married    Spouse name: Emmry Hinsch   Number of children: 2   Years of education: Not on file   Highest education level: Not on file  Occupational History   Occupation: Retired  Tobacco Use   Smoking status: Former    Types: Cigarettes    Quit date: 12/28/1986    Years  since quitting: 36.0   Smokeless tobacco: Never  Vaping Use   Vaping Use: Never used  Substance and Sexual Activity   Alcohol use: No   Drug use: No   Sexual activity: Not Currently  Other Topics Concern   Not on file  Social History Narrative   Not on file   Social Determinants of Health   Financial Resource Strain: Low Risk  (08/13/2022)   Overall Financial Resource Strain (CARDIA)    Difficulty of Paying Living Expenses: Not hard at all  Food Insecurity: No Food Insecurity (08/13/2022)   Hunger Vital Sign    Worried About Running Out of Food in the Last Year: Never true    Ran Out of Food in the Last Year: Never true  Transportation Needs: No Transportation Needs (08/13/2022)   PRAPARE - Administrator, Civil Service (Medical): No    Lack of Transportation (Non-Medical): No  Physical Activity: Sufficiently Active (08/13/2022)   Exercise Vital Sign    Days of Exercise per Week: 7 days    Minutes of Exercise per Session: 30 min  Stress: No Stress Concern Present (08/13/2022)   [employer] of Occupational Health -  Occupational Stress Questionnaire    Feeling of Stress : Not at all  Social Connections: Socially Integrated (08/13/2022)   Social Connection and Isolation Panel [NHANES]    Frequency of Communication with Friends and Family: More than three times a week    Frequency of Social Gatherings with Friends and Family: More than three times a week    Attends Religious Services: More than 4 times per year    Active Member of [employer] or Organizations: Yes    Attends Engineer, structural: Not on file    Marital Status: Married   Family History  Problem Relation Age of Onset   Breast cancer Neg Hx    Allergies  Allergen Reactions   Ibuprofen Nausea And Vomiting and Other (See Comments)    GI upset, GERD   Azithromycin Hives    Itching, blotchy rash    Current Outpatient Medications  Medication Sig Dispense Refill   aspirin EC 81 MG tablet Take  81 mg by mouth daily.     atorvastatin (LIPITOR) 10 MG tablet Take 1 tablet (10 mg total) by mouth daily. 90 tablet 1   Calcium Carb-Cholecalciferol 600-800 MG-UNIT TABS Take 1 tablet by mouth 2 (two) times daily.      cetirizine (ZYRTEC) 10 MG tablet Take 10 mg by mouth daily.     clotrimazole-betamethasone (LOTRISONE) cream Apply 1-2 times a day as needed for spot treatment for yeast skin rash 30 g 1   esomeprazole (NEXIUM) 20 MG capsule Take 20 mg by mouth daily before breakfast.      fluticasone (FLONASE) 50 MCG/ACT nasal spray Place 2 sprays into the nose at bedtime.      Glucosamine-Chondroitin (COSAMIN DS PO) Take 1 tablet by mouth 2 (two) times daily.     Multiple Vitamins-Minerals (MULTIVITAMIN WITH MINERALS) tablet Take 1 tablet by mouth every evening. Women's One-A-Day     nystatin (MYCOSTATIN/NYSTOP) powder Apply 1 Application topically 3 (three) times daily. 30 g 1   Omega-3 Fatty Acids (FISH OIL) 1000 MG CAPS Take 1,000 mg by mouth daily.     TURMERIC PO Take 538 mg by mouth daily.     No current facility-administered medications for this visit.   No results found.  Review of Systems:   A ROS was performed including pertinent positives and negatives as documented in the HPI.  Physical Exam :   Constitutional: NAD and appears stated age Neurological: Alert and oriented Psych: Appropriate affect and cooperative There were no vitals taken for this visit.   Comprehensive Musculoskeletal Exam:    Patient has tenderness to palpation over the posterior medial knee superior joint line in the region of the hamstring tendons.  No patellar or joint line tenderness.  No soft tissue edema or joint effusion present.  Passive and active range of motion from 0 to 110 degrees.  Knee flexion and extension strength 5/5.  Knee joint is stable with no laxity with varus or valgus stress.  Negative Lachman's.  Distal neurosensory exam intact.  Imaging:   Xray (Left knee 4 views): Medial joint  line narrowing with small osteophytes indicating mild to moderate osteoarthritis.  Mild patellofemoral degenerative changes.   I personally reviewed and interpreted the radiographs.   Assessment:   82 y.o. female presenting with acute left knee pain.  Based on her location of pain and presentation I suspect that this is more likely an injury or strain to the hamstring tendon as opposed to pathology of the knee joint or flare  up of arthritis.  At this point I would like her to work on some hamstring stretching at home as well as continue Tylenol and Voltaren gel.  Recommend following up for reevaluation in about 3 weeks if symptoms have not significantly improved.  Plan :    -Return to clinic as needed     I personally saw and evaluated the patient, and participated in the management and treatment plan.  Hazle Nordmann, PA-C Orthopedics  This document was dictated using Conservation officer, historic buildings. A reasonable attempt at proof reading has been made to minimize errors.

## 2023-02-13 ENCOUNTER — Ambulatory Visit: Payer: PPO | Admitting: Internal Medicine

## 2023-02-24 ENCOUNTER — Ambulatory Visit (INDEPENDENT_AMBULATORY_CARE_PROVIDER_SITE_OTHER): Payer: PPO | Admitting: Physician Assistant

## 2023-02-24 ENCOUNTER — Other Ambulatory Visit (INDEPENDENT_AMBULATORY_CARE_PROVIDER_SITE_OTHER): Payer: PPO

## 2023-02-24 ENCOUNTER — Encounter: Payer: Self-pay | Admitting: Physician Assistant

## 2023-02-24 DIAGNOSIS — Z96651 Presence of right artificial knee joint: Secondary | ICD-10-CM | POA: Diagnosis not present

## 2023-02-24 DIAGNOSIS — M7052 Other bursitis of knee, left knee: Secondary | ICD-10-CM | POA: Diagnosis not present

## 2023-02-24 MED ORDER — METHYLPREDNISOLONE ACETATE 40 MG/ML IJ SUSP
40.0000 mg | INTRAMUSCULAR | Status: AC | PRN
  Administered 2023-02-24: 40 mg via INTRAMUSCULAR

## 2023-02-24 MED ORDER — LIDOCAINE HCL 1 % IJ SOLN
1.0000 mL | INTRAMUSCULAR | Status: AC | PRN
  Administered 2023-02-24: 1 mL

## 2023-02-24 NOTE — Progress Notes (Signed)
Office Visit Note   Patient: [name]           Date of Birth: Oct 27, 1940           MRN: [id number] Visit Date: 02/24/2023              Requested by: Angela Munroe, NP [address] [place],  [place] [address] PCP: Angela Munroe, NP   Assessment & Plan: Visit Diagnoses:  1. Status post total right knee replacement   2. Pes anserinus bursitis of left knee     Plan: We will have her undergo bone scan to rule out loosening of 58 right total knee arthroplasty components.  Follow-up after the bone scan to go over results and discuss further treatment.  Will also reevaluate the left knee at that time.  Encouraged her to work on quad strengthening particularly the VMO left leg.  Also she is shown an open patella brace.  She wishes to pick this up over-the-counter on her own.  Questions were encouraged and answered at length  Follow-Up Instructions: Return for After Bone scan.   Orders:  Orders Placed This Encounter  Procedures   Trigger Point Inj   XR Knee 1-2 Views Right   No orders of the defined types were placed in this encounter.     Procedures: Trigger Point Inj  Date/Time: 02/24/2023 9:40 AM  Performed by: Angela Bouchard, PA-C Authorized by: Angela Bouchard, PA-C   Consent Given by:  Patient Total # of Trigger Points:  1 Location: lower extremity   Needle Size:  25 G Medications #1:  1 mL lidocaine 1 %; 40 mg methylPREDNISolone acetate 40 MG/ML    Clinical Data: No additional findings.   Subjective: Chief Complaint  Patient presents with   Left Knee - Pain   Right Knee - Pain    HPI [name] 82 year old female well-known to [name] service comes in today with bilateral knee pain.  History of right total knee arthroplasty which was a conversion from U knee compartmental arthroplasty on 06/15/2019.  She denies any recent falls or injuries.  She is taking Tylenol as needed.  Left knee she states is cracking and popping.  Feels like it will give way at  times.  Right knee no giving way.  She also notes no fevers or chills. Radiographs left knee 4 views dated 01/28/2023: Shows mild patellofemoral arthritis and moderate narrowing of the medial joint line.  No acute fractures.  Review of Systems See HPI  Objective: Vital Signs: LMP  (LMP Unknown)   Physical Exam General: Well-developed well-nourished female who ambulates without any assistive device. Psych: Alert and oriented x 3 Ortho Exam Bilateral knees good range of motion of both knees.  Left knee with significant patellofemoral crepitus with passive range of motion.  Buttressing the patella with passive range of motion reduces the crepitus considerably.  She has slight valgus deformity of the left knee.  Maximal tenderness left knee over the medial joint line and pes anserinus region. Right knee global tenderness.  No abnormal warmth erythema or effusion.  No instability valgus varus stressing.  Anterior drawer is negative.  Specialty Comments:  No specialty comments available.  Imaging: XR Knee 1-2 Views Right  Result Date: 02/24/2023 Right knee 2 views: No acute fractures.  No bony abnormalities.  Total knee arthroplasty components appear well-seated.  Knee is well located.    PMFS History: Patient Active Problem List   Diagnosis Date Noted  HLD (hyperlipidemia) 09/03/2021   Class 2 obesity due to excess calories with body mass index (BMI) of 35.0 to 35.9 in adult 09/03/2021   Elevated blood pressure reading in office without diagnosis of hypertension 09/03/2021   Primary osteoarthritis involving multiple joints 08/22/2020   OAB (overactive bladder) 08/22/2020   Acid reflux 04/06/2018   Past Medical History:  Diagnosis Date   Arthritis    Cancer (HCC)    History of chicken pox    History of colon polyps    History of mouth cancer    Osteoarthritis of knee    Medial compartment, Right    Family History  Problem Relation Age of Onset   Breast cancer Neg Hx      Past Surgical History:  Procedure Laterality Date   APPENDECTOMY     DILATION AND CURETTAGE OF UTERUS     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   KNEE ARTHROSCOPY W/ MENISCAL REPAIR Left    PARTIAL KNEE ARTHROPLASTY Right 09/16/2017   PARTIAL KNEE ARTHROPLASTY Right 09/16/2017   Procedure: RIGHT UNICOMPARTMENTAL KNEE;  Surgeon: Angela Hitch, MD;  Location: [facility];  Service: Orthopedics;  Laterality: Right;   SHOULDER ARTHROSCOPY Left 11/2018   TOTAL KNEE REVISION Right 06/15/2019   Procedure: RIGHT UNICOMPARTMENTAL ARTHROPLASTY TO TOTAL KNEE ARTHROPLASTY;  Surgeon: Angela Hitch, MD;  Location: [facility];  Service: Orthopedics;  Laterality: Right;   TUBAL LIGATION     Social History   Occupational History   Occupation: Retired  Tobacco Use   Smoking status: Former    Types: Cigarettes    Quit date: 12/28/1986    Years since quitting: 36.1   Smokeless tobacco: Never  Vaping Use   Vaping Use: Never used  Substance and Sexual Activity   Alcohol use: No   Drug use: No   Sexual activity: Not Currently

## 2023-02-26 ENCOUNTER — Other Ambulatory Visit: Payer: Self-pay

## 2023-02-26 ENCOUNTER — Telehealth: Payer: Self-pay

## 2023-02-26 DIAGNOSIS — Z96651 Presence of right artificial knee joint: Secondary | ICD-10-CM

## 2023-02-26 NOTE — Telephone Encounter (Signed)
Patient aware that sometimes this takes several days due to the volume of scheduling

## 2023-02-26 NOTE — Telephone Encounter (Signed)
Patient called concerning having a bone scan done.  Stated that she reached out to her insurance and was advised that they had not received anything.  Please advise.  CB# [phone].  Thank you.

## 2023-03-01 ENCOUNTER — Other Ambulatory Visit: Payer: Self-pay | Admitting: Internal Medicine

## 2023-03-01 DIAGNOSIS — E782 Mixed hyperlipidemia: Secondary | ICD-10-CM

## 2023-03-04 NOTE — Telephone Encounter (Signed)
Requested by interface surescripts. Last refill doc. 12/07/22. #90 . Last labs 09/06/22. Future visit in 1 week  Requested Prescriptions  Refused Prescriptions Disp Refills   atorvastatin (LIPITOR) 10 MG tablet [Pharmacy Med Name: ATORVASTATIN 10 MG TABLET] 90 tablet 1    Sig: TAKE 1 TABLET BY MOUTH EVERY DAY     Cardiovascular:  Antilipid - Statins Failed - 03/01/2023  8:50 AM      Failed - Lipid Panel in normal range within the last 12 months    Cholesterol  Date Value Ref Range Status  09/06/2022 200 (H) <200 mg/dL Final   LDL Cholesterol (Calc)  Date Value Ref Range Status  09/06/2022 116 (H) mg/dL (calc) Final    Comment:    Reference range: <100 . Desirable range <100 mg/dL for primary prevention;   <70 mg/dL for patients with CHD or diabetic patients  with > or = 2 CHD risk factors. Marland Kitchen LDL-C is now calculated using the Martin-Hopkins  calculation, which is a validated novel method providing  better accuracy than the Friedewald equation in the  estimation of LDL-C.  Horald Pollen et al. Lenox Ahr. 9604;540(98): 2061-2068  (http://education.[facility].com/faq/FAQ164)    HDL  Date Value Ref Range Status  09/06/2022 52 > OR = 50 mg/dL Final   Triglycerides  Date Value Ref Range Status  09/06/2022 197 (H) <150 mg/dL Final         Passed - Patient is not pregnant      Passed - Valid encounter within last 12 months    Recent Outpatient Visits           1 month ago Candidal intertrigo   [facility] [place], Netta Neat, DO   5 months ago Encounter for general adult medical examination with abnormal findings   [facility] [place], Salvadore Oxford, NP   1 year ago Encounter for general adult medical examination with abnormal findings   [facility] [place], Salvadore Oxford, NP   6 years ago Annual physical exam   [facility], MD   7 years ago Cerumen impaction, bilateral    [facility], MD       Future Appointments             In 1 week Baity, Salvadore Oxford, NP [facility]   In 1 week Kathryne Hitch, MD [facility]

## 2023-03-06 ENCOUNTER — Encounter (HOSPITAL_COMMUNITY)
Admission: RE | Admit: 2023-03-06 | Discharge: 2023-03-06 | Disposition: A | Discharge status: Home / Self Care | Payer: PPO | Source: Ambulatory Visit | Attending: Physician Assistant | Admitting: Physician Assistant

## 2023-03-06 DIAGNOSIS — Z471 Aftercare following joint replacement surgery: Secondary | ICD-10-CM | POA: Diagnosis not present

## 2023-03-06 DIAGNOSIS — M25561 Pain in right knee: Secondary | ICD-10-CM | POA: Diagnosis not present

## 2023-03-06 DIAGNOSIS — Z96651 Presence of right artificial knee joint: Secondary | ICD-10-CM | POA: Insufficient documentation

## 2023-03-13 ENCOUNTER — Ambulatory Visit (INDEPENDENT_AMBULATORY_CARE_PROVIDER_SITE_OTHER): Payer: PPO | Admitting: Internal Medicine

## 2023-03-13 ENCOUNTER — Encounter: Payer: Self-pay | Admitting: Internal Medicine

## 2023-03-13 VITALS — BP 136/82 | HR 67 | Temp 95.9°F | Wt 169.0 lb

## 2023-03-13 DIAGNOSIS — M159 Polyosteoarthritis, unspecified: Secondary | ICD-10-CM

## 2023-03-13 DIAGNOSIS — N3281 Overactive bladder: Secondary | ICD-10-CM | POA: Diagnosis not present

## 2023-03-13 DIAGNOSIS — Z6831 Body mass index (BMI) 31.0-31.9, adult: Secondary | ICD-10-CM

## 2023-03-13 DIAGNOSIS — M150 Primary generalized (osteo)arthritis: Secondary | ICD-10-CM

## 2023-03-13 DIAGNOSIS — E782 Mixed hyperlipidemia: Secondary | ICD-10-CM | POA: Diagnosis not present

## 2023-03-13 DIAGNOSIS — E6609 Other obesity due to excess calories: Secondary | ICD-10-CM | POA: Diagnosis not present

## 2023-03-13 DIAGNOSIS — K219 Gastro-esophageal reflux disease without esophagitis: Secondary | ICD-10-CM | POA: Diagnosis not present

## 2023-03-13 DIAGNOSIS — R7303 Prediabetes: Secondary | ICD-10-CM

## 2023-03-13 MED ORDER — ATORVASTATIN CALCIUM 10 MG PO TABS: 10.0000 mg | ORAL_TABLET | Freq: Every day | ORAL | 1 refills | Status: DC

## 2023-03-13 MED ORDER — ESOMEPRAZOLE MAGNESIUM 20 MG PO CPDR: 20.0000 mg | DELAYED_RELEASE_CAPSULE | Freq: Two times a day (BID) | ORAL | 1 refills | Status: DC

## 2023-03-13 NOTE — Progress Notes (Signed)
Subjective:    Patient ID: [name], female    DOB: December 17, 1940, 82 y.o.   MRN: [id number]  HPI  Patient presents to clinic today for 66-month follow-up of chronic conditions.  OA: Mainly in her hands and knees.  She takes Glucosamine Chondroitin and Turmeric OTC.  She follows with orthopedics.  GERD: Triggered by spicy foods.  She has occasional breakthrough on Esomeprazole for which she takes Tums as needed with good relief of symptoms.  Upper GI from 05/2018 reviewed.  OAB: She reports mainly urinary urgency and frequency.  She is not currently taking any medications for this.  She gets botox as needed.  She follows with urology.  HLD: Her last LDL was 116, triglycerides 197, 09/2022.  She denies myalgias on Atorvastatin and is taking Fish Oil OTC.  She tries to consume low-fat diet.  Prediabetes: Her last A1c was 6%, 09/2022.  She is not taking any oral diabetic medication at this time.  She does not check her sugars.  Review of Systems     Past Medical History:  Diagnosis Date   Arthritis    Cancer (HCC)    History of chicken pox    History of colon polyps    History of mouth cancer    Osteoarthritis of knee    Medial compartment, Right    Current Outpatient Medications  Medication Sig Dispense Refill   aspirin EC 81 MG tablet Take 81 mg by mouth daily.     atorvastatin (LIPITOR) 10 MG tablet Take 1 tablet (10 mg total) by mouth daily. 90 tablet 1   Calcium Carb-Cholecalciferol 600-800 MG-UNIT TABS Take 1 tablet by mouth 2 (two) times daily.      cetirizine (ZYRTEC) 10 MG tablet Take 10 mg by mouth daily.     clotrimazole-betamethasone (LOTRISONE) cream Apply 1-2 times a day as needed for spot treatment for yeast skin rash 30 g 1   esomeprazole (NEXIUM) 20 MG capsule Take 20 mg by mouth daily before breakfast.      fluticasone (FLONASE) 50 MCG/ACT nasal spray Place 2 sprays into the nose at bedtime.      Glucosamine-Chondroitin (COSAMIN DS PO) Take 1 tablet by  mouth 2 (two) times daily.     Multiple Vitamins-Minerals (MULTIVITAMIN WITH MINERALS) tablet Take 1 tablet by mouth every evening. Women's One-A-Day     nystatin (MYCOSTATIN/NYSTOP) powder Apply 1 Application topically 3 (three) times daily. 30 g 1   Omega-3 Fatty Acids (FISH OIL) 1000 MG CAPS Take 1,000 mg by mouth daily.     TURMERIC PO Take 538 mg by mouth daily.     No current facility-administered medications for this visit.    Allergies  Allergen Reactions   Ibuprofen Nausea And Vomiting and Other (See Comments)    GI upset, GERD   Azithromycin Hives    Itching, blotchy rash     Family History  Problem Relation Age of Onset   Breast cancer Neg Hx     Social History   Socioeconomic History   Marital status: Married    Spouse name: Oneika Exner   Number of children: 2   Years of education: Not on file   Highest education level: 12th grade  Occupational History   Occupation: Retired  Tobacco Use   Smoking status: Former    Types: Cigarettes    Quit date: 12/28/1986    Years since quitting: 36.2   Smokeless tobacco: Never  Vaping Use   Vaping Use: Never used  Substance and Sexual Activity   Alcohol use: No   Drug use: No   Sexual activity: Not Currently  Other Topics Concern   Not on file  Social History Narrative   Not on file   Social Determinants of Health   Financial Resource Strain: Low Risk  (03/11/2023)   Overall Financial Resource Strain (CARDIA)    Difficulty of Paying Living Expenses: Not very hard  Food Insecurity: No Food Insecurity (03/11/2023)   Hunger Vital Sign    Worried About Running Out of Food in the Last Year: Never true    Ran Out of Food in the Last Year: Never true  Transportation Needs: No Transportation Needs (03/11/2023)   PRAPARE - Administrator, Civil Service (Medical): No    Lack of Transportation (Non-Medical): No  Physical Activity: Sufficiently Active (08/13/2022)   Exercise Vital Sign    Days of Exercise per  Week: 7 days    Minutes of Exercise per Session: 30 min  Stress: No Stress Concern Present (03/11/2023)   [name]    Feeling of Stress : Not at all  Social Connections: Socially Integrated (03/11/2023)   Social Connection and Isolation Panel [NHANES]    Frequency of Communication with Friends and Family: More than three times a week    Frequency of Social Gatherings with Friends and Family: Once a week    Attends Religious Services: More than 4 times per year    Active Member of [employer] or Organizations: Yes    Attends Banker Meetings: Never    Marital Status: Married  Catering manager Violence: Not At Risk (08/13/2022)   Humiliation, Afraid, Rape, and Kick questionnaire    Fear of Current or Ex-Partner: No    Emotionally Abused: No    Physically Abused: No    Sexually Abused: No     Constitutional: Denies fever, malaise, fatigue, headache or abrupt weight changes.  HEENT: Denies eye pain, eye redness, ear pain, ringing in the ears, wax buildup, runny nose, nasal congestion, bloody nose, or sore throat. Respiratory: Denies difficulty breathing, shortness of breath, cough or sputum production.   Cardiovascular: Denies chest pain, chest tightness, palpitations or swelling in the hands or feet.  Gastrointestinal: Denies abdominal pain, bloating, constipation, diarrhea or blood in the stool.  GU: Patient reports urinary urgency and frequency.  Denies pain with urination, burning sensation, blood in urine, odor or discharge. Musculoskeletal: Patient reports joint pain.  Denies decrease in range of motion, difficulty with gait, muscle pain or joint swelling.  Skin: Denies redness, rashes, lesions or ulcercations.  Neurological: Denies dizziness, difficulty with memory, difficulty with speech or problems with balance and coordination.  Psych: Denies anxiety, depression, SI/HI.  No other specific complaints in a  complete review of systems (except as listed in HPI above).  Objective:   Physical Exam  BP 136/82 (BP Location: Left Arm, Patient Position: Sitting, Cuff Size: Normal)   Pulse 67   Temp (!) 95.9 F (35.5 C) (Temporal)   Wt 169 lb (76.7 kg)   LMP  (LMP Unknown)   SpO2 97%   BMI 31.93 kg/m   Wt Readings from Last 3 Encounters:  01/06/23 177 lb (80.3 kg)  09/06/22 187 lb (84.8 kg)  09/03/21 184 lb 12.8 oz (83.8 kg)    General: Appears her stated age, obese, in NAD. Skin: Warm, dry and intact.  Sun damaged skin of hands noted. HEENT: Head: normal shape and  size; Eyes: sclera white, no icterus, conjunctiva pink, PERRLA and EOMs intact;  Cardiovascular: Normal rate and rhythm. S1,S2 noted.  No murmur, rubs or gallops noted. No JVD or BLE edema. No carotid bruits noted. Pulmonary/Chest: Normal effort and positive vesicular breath sounds. No respiratory distress. No wheezes, rales or ronchi noted.  Abdomen: Soft and nontender. Normal bowel sounds.  Musculoskeletal: No difficulty with gait.  Neurological: Alert and oriented. Coordination normal.  Psychiatric: Mood and affect normal. Behavior is normal. Judgment and thought content normal.     BMET    Component Value Date/Time   NA 141 09/06/2022 0837   K 4.5 09/06/2022 0837   CL 103 09/06/2022 0837   CO2 27 09/06/2022 0837   GLUCOSE 100 (H) 09/06/2022 0837   BUN 19 09/06/2022 0837   CREATININE 0.73 09/06/2022 0837   CALCIUM 9.4 09/06/2022 0837   GFRNONAA >60 06/16/2019 0337   GFRNONAA >89 06/24/2016 0853   GFRAA >60 06/16/2019 0337   GFRAA >89 06/24/2016 0853    Lipid Panel     Component Value Date/Time   CHOL 200 (H) 09/06/2022 0837   TRIG 197 (H) 09/06/2022 0837   HDL 52 09/06/2022 0837   CHOLHDL 3.8 09/06/2022 0837   VLDL 30.4 08/22/2020 1211   LDLCALC 116 (H) 09/06/2022 0837    CBC    Component Value Date/Time   WBC 6.4 09/06/2022 0837   RBC 4.99 09/06/2022 0837   HGB 15.0 09/06/2022 0837   HCT 45.0  09/06/2022 0837   PLT 181 09/06/2022 0837   MCV 90.2 09/06/2022 0837   MCV 86.6 06/24/2016 0909   MCH 30.1 09/06/2022 0837   MCHC 33.3 09/06/2022 0837   RDW 13.1 09/06/2022 0837    Hgb A1C Lab Results  Component Value Date   HGBA1C 6.0 (H) 09/06/2022           Assessment & Plan:      RTC in 6 months for annual exam Nicki Reaper, NP

## 2023-03-13 NOTE — Patient Instructions (Signed)

## 2023-03-13 NOTE — Assessment & Plan Note (Signed)
Encourage weight loss as this can help reduce joint pain Continue Tylenol as needed She will continue to follow with orthopedics

## 2023-03-13 NOTE — Assessment & Plan Note (Signed)
A1c today °Encouraged low-carb diet and exercise for weight loss °

## 2023-03-13 NOTE — Assessment & Plan Note (Signed)
Encourage diet and exercise for weight loss 

## 2023-03-13 NOTE — Assessment & Plan Note (Signed)
She will continue Botox injections as needed

## 2023-03-13 NOTE — Assessment & Plan Note (Signed)
C-Met and lipid profile today Encouraged to consume a low-fat diet Continue atorvastatin and fish oil

## 2023-03-13 NOTE — Assessment & Plan Note (Signed)
Avoid foods that trigger reflux Encouraged also discomfort is reflux symptoms Increase Esomeprazole to 20 mg twice daily Okay to continue Tums as needed

## 2023-03-14 LAB — COMPLETE METABOLIC PANEL WITH GFR
AG Ratio: 1.5 (calc) (ref 1.0–2.5)
ALT: 18 U/L (ref 6–29)
AST: 20 U/L (ref 10–35)
Albumin: 4.3 g/dL (ref 3.6–5.1)
Alkaline phosphatase (APISO): 54 U/L (ref 37–153)
BUN: 15 mg/dL (ref 7–25)
CO2: 27 mmol/L (ref 20–32)
Calcium: 10 mg/dL (ref 8.6–10.4)
Chloride: 104 mmol/L (ref 98–110)
Creat: 0.75 mg/dL (ref 0.60–0.95)
Globulin: 2.8 g/dL (calc) (ref 1.9–3.7)
Glucose, Bld: 107 mg/dL — ABNORMAL HIGH (ref 65–99)
Potassium: 4.2 mmol/L (ref 3.5–5.3)
Sodium: 142 mmol/L (ref 135–146)
Total Bilirubin: 0.4 mg/dL (ref 0.2–1.2)
Total Protein: 7.1 g/dL (ref 6.1–8.1)
eGFR: 80 mL/min/{1.73_m2} (ref >=60)

## 2023-03-14 LAB — LIPID PANEL
Cholesterol: 127 mg/dL (ref <200)
HDL: 59 mg/dL (ref >=50)
LDL Cholesterol (Calc): 50 mg/dL (calc)
Non-HDL Cholesterol (Calc): 68 mg/dL (calc) (ref <130)
Total CHOL/HDL Ratio: 2.2 (calc) (ref <5.0)
Triglycerides: 101 mg/dL (ref <150)

## 2023-03-14 LAB — CBC
HCT: 44.5 % (ref 35.0–45.0)
Hemoglobin: 14.4 g/dL (ref 11.7–15.5)
MCH: 29.9 pg (ref 27.0–33.0)
MCHC: 32.4 g/dL (ref 32.0–36.0)
MCV: 92.5 fL (ref 80.0–100.0)
MPV: 10.4 fL (ref 7.5–12.5)
Platelets: 159 10*3/uL (ref 140–400)
RBC: 4.81 10*6/uL (ref 3.80–5.10)
RDW: 12.5 % (ref 11.0–15.0)
WBC: 7 10*3/uL (ref 3.8–10.8)

## 2023-03-14 LAB — HEMOGLOBIN A1C
Hgb A1c MFr Bld: 6 % of total Hgb — ABNORMAL HIGH (ref <5.7)
Mean Plasma Glucose: 126 mg/dL
eAG (mmol/L): 7 mmol/L

## 2023-03-17 ENCOUNTER — Ambulatory Visit (INDEPENDENT_AMBULATORY_CARE_PROVIDER_SITE_OTHER): Payer: PPO | Admitting: Orthopaedic Surgery

## 2023-03-17 ENCOUNTER — Encounter: Payer: Self-pay | Admitting: Orthopaedic Surgery

## 2023-03-17 DIAGNOSIS — G8929 Other chronic pain: Secondary | ICD-10-CM | POA: Diagnosis not present

## 2023-03-17 DIAGNOSIS — Z96651 Presence of right artificial knee joint: Secondary | ICD-10-CM | POA: Diagnosis not present

## 2023-03-17 DIAGNOSIS — M25561 Pain in right knee: Secondary | ICD-10-CM

## 2023-03-17 NOTE — Progress Notes (Signed)
The patient is an active 82 year old female well-known to [place].  She had a previous partial knee arthroplasty that was converted to a total knee arthroplasty with the right knee several years ago.  She continues on occasion have pain with that knee when she walks but not all the time and is not a constant pain.  Recent x-rays of the right knee showed a well-seated total knee arthroplasty with no complicating features.  We decided to obtain a three-phase bone scan to rule out prosthetic loosening.  The three-phase bone scan shows no evidence of loosening or infection.  There is no uptake around the knee arthroplasty at all.  On my exam today her right knee shows no effusion.  The alignment is neutral.  Her range of motion is full and the knee feels ligamentously stable.  She does have some patellofemoral arthritis on the left side.  From my standpoint I did give her reassurance that the knee replacement itself looks down.  I would not recommend any further surgery on that knee and she should likely look at her shoe wear when she walks and just exercise walk on flat surfaces.  She agrees with that treatment plan.  All questions concerns were answered and addressed.  Follow-up is as needed.

## 2023-03-20 DIAGNOSIS — N3946 Mixed incontinence: Secondary | ICD-10-CM | POA: Diagnosis not present

## 2023-04-04 ENCOUNTER — Telehealth: Payer: Self-pay | Admitting: Internal Medicine

## 2023-04-04 DIAGNOSIS — N3281 Overactive bladder: Secondary | ICD-10-CM | POA: Diagnosis not present

## 2023-04-04 DIAGNOSIS — N3946 Mixed incontinence: Secondary | ICD-10-CM | POA: Diagnosis not present

## 2023-04-04 MED ORDER — LANCET DEVICE MISC: 0 refills | Status: DC

## 2023-04-04 MED ORDER — BLOOD GLUCOSE MONITORING SUPPL DEVI: 0 refills | Status: AC

## 2023-04-04 MED ORDER — BLOOD GLUCOSE TEST VI STRP: ORAL_STRIP | 0 refills | Status: DC

## 2023-04-04 MED ORDER — LANCETS MISC. MISC: 0 refills | Status: AC

## 2023-04-04 NOTE — Telephone Encounter (Signed)
RX sent off

## 2023-04-04 NOTE — Telephone Encounter (Signed)
Copied from CRM [id number]. Topic: General - Other >> Apr 04, 2023  8:15 AM Everette C wrote: Reason for CRM: Medication Refill - Medication: Diabetic testing kit and supplies   Has the patient contacted their pharmacy? No. (Agent: If no, request that the patient contact the pharmacy for the refill. If patient does not wish to contact the pharmacy document the reason why and proceed with request.) (Agent: If yes, when and what did the pharmacy advise?)  Preferred Pharmacy (with phone number or street name): [phone],  - [address] [place] [address] Phone: [phone] Fax: [phone] Hours: Not open 24 hours   Has the patient been seen for an appointment in the last year OR does the patient have an upcoming appointment? Yes.    Agent: Please be advised that RX refills may take up to 3 business days. We ask that you follow-up with your pharmacy.

## 2023-04-04 NOTE — Addendum Note (Signed)
Addended by: Kavin Leech E on: 04/04/2023 04:45 PM   Modules accepted: Orders

## 2023-04-04 NOTE — Telephone Encounter (Signed)
You can for BID testing. She is only prediabetic.

## 2023-04-04 NOTE — Telephone Encounter (Signed)
Patient requests Diabetic testing kit and supplies, not on current list.

## 2023-04-29 DIAGNOSIS — N3000 Acute cystitis without hematuria: Secondary | ICD-10-CM | POA: Diagnosis not present

## 2023-05-15 ENCOUNTER — Other Ambulatory Visit: Payer: Self-pay | Admitting: Family Medicine

## 2023-05-15 DIAGNOSIS — B372 Candidiasis of skin and nail: Secondary | ICD-10-CM

## 2023-05-16 NOTE — Telephone Encounter (Signed)
Requested medication (s) are due for refill today - yes  Requested medication (s) are on the active medication list -yes  Future visit scheduled -yes  Last refill: 01/06/23 30g 1RF  Notes to clinic: off protocol- provider review   Requested Prescriptions  Pending Prescriptions Disp Refills   clotrimazole-betamethasone (LOTRISONE) cream [Pharmacy Med Name: CLOTRIMAZOLE-BETAMETHASONE CRM] 30 g 1    Sig: Apply 1-2 times a day as needed for spot treatment for yeast skin rash     Off-Protocol Failed - 05/15/2023 11:52 AM      Failed - Medication not assigned to a protocol, review manually.      Passed - Valid encounter within last 12 months    Recent Outpatient Visits           2 months ago Prediabetes   [facility] [place], Salvadore Oxford, NP   4 months ago Candidal intertrigo   [place] [facility] [place], Netta Neat, DO   8 months ago Encounter for general adult medical examination with abnormal findings   [place] [facility] [place], Salvadore Oxford, NP   1 year ago Encounter for general adult medical examination with abnormal findings   [place] [facility] [place], Salvadore Oxford, NP   6 years ago Annual physical exam   [facility] [place], Maylon Peppers, MD       Future Appointments             In 3 months Baity, Salvadore Oxford, NP [place] [facility], [facility]               Requested Prescriptions  Pending Prescriptions Disp Refills   clotrimazole-betamethasone (LOTRISONE) cream [Pharmacy Med Name: CLOTRIMAZOLE-BETAMETHASONE CRM] 30 g 1    Sig: Apply 1-2 times a day as needed for spot treatment for yeast skin rash     Off-Protocol Failed - 05/15/2023 11:52 AM      Failed - Medication not assigned to a protocol, review manually.      Passed - Valid encounter within last 12 months    Recent Outpatient Visits           2 months ago Prediabetes   [place] [facility] [place], Salvadore Oxford, NP   4 months ago Candidal intertrigo   [facility] Smitty Cords, DO   8 months ago Encounter for general adult medical examination with abnormal findings   [place] [facility] [place], Salvadore Oxford, NP   1 year ago Encounter for general adult medical examination with abnormal findings   [facility] [place], Salvadore Oxford, NP   6 years ago Annual physical exam   [facility] [place], MD       Future Appointments             In 3 months Baity, Salvadore Oxford, NP [facility], [facility]

## 2023-05-16 NOTE — Telephone Encounter (Signed)
Requested medication (s) are due for refill today - yes  Requested medication (s) are on the active medication list -yes  Future visit scheduled -yes  Last refill: 01/06/23 30g 1RF  Notes to clinic: off protocol- provider review   Requested Prescriptions  Pending Prescriptions Disp Refills   nystatin (MYCOSTATIN/NYSTOP) powder [Pharmacy Med Name: NYSTATIN 100,000 UNIT/GM POWD] 30 g 1    Sig: APPLY TOPICALLY 3 TIMES A DAY     Off-Protocol Failed - 05/15/2023 11:52 AM      Failed - Medication not assigned to a protocol, review manually.      Passed - Valid encounter within last 12 months    Recent Outpatient Visits           2 months ago Prediabetes   [place] [facility] [place], Salvadore Oxford, NP   4 months ago Candidal intertrigo   [place] [facility] [place], Netta Neat, DO   8 months ago Encounter for general adult medical examination with abnormal findings   [place] [facility] [place], Salvadore Oxford, NP   1 year ago Encounter for general adult medical examination with abnormal findings   [place] [facility] [place], Salvadore Oxford, NP   6 years ago Annual physical exam   [facility] [place], Maylon Peppers, MD       Future Appointments             In 3 months Baity, Salvadore Oxford, NP [facility], [facility]               Requested Prescriptions  Pending Prescriptions Disp Refills   nystatin (MYCOSTATIN/NYSTOP) powder [Pharmacy Med Name: NYSTATIN 100,000 UNIT/GM POWD] 30 g 1    Sig: APPLY TOPICALLY 3 TIMES A DAY     Off-Protocol Failed - 05/15/2023 11:52 AM      Failed - Medication not assigned to a protocol, review manually.      Passed - Valid encounter within last 12 months    Recent Outpatient Visits           2 months ago Prediabetes   [place] [facility] [place], Salvadore Oxford, NP   4 months ago Candidal intertrigo   [place] [facility]  Smitty Cords, DO   8 months ago Encounter for general adult medical examination with abnormal findings   [place] [facility] [place], Salvadore Oxford, NP   1 year ago Encounter for general adult medical examination with abnormal findings   [facility] [place], Salvadore Oxford, NP   6 years ago Annual physical exam   [facility], MD       Future Appointments             In 3 months Baity, Salvadore Oxford, NP [place] [facility], [facility]

## 2023-05-19 ENCOUNTER — Ambulatory Visit: Payer: Self-pay | Admitting: *Deleted

## 2023-05-19 NOTE — Telephone Encounter (Signed)
Summary: knot on side of neck   Patient called in experiencing a knot that popped up on the side of her neck, it is swollen but not painful. She said a little sore when it's touched. No appointments today, there was one appt with [facility] tomorrow but patient declined and stated she wanted this to be looked at today, please advise.   Patients callback # [phone]           Chief Complaint: "knot" side of neck , soreness with pain with swallowing  Symptoms: "knot" on right side of neck between chin and ear. Soreness to touch, "hurts" to swallow. Noted last night. Lump size of a quarter Frequency: last night  Pertinent Negatives: Patient denies fever no redness no severe pain  Disposition: [] ED /[] Urgent Care (no appt availability in office) / [x] Appointment(In office/virtual)/ []  [place] [facility]/ [] Home Care/ [] Refused Recommended Disposition /[] [facility]/ []  Follow-up with PCP Additional Notes:   Appt scheduled for tomorrow.        Reason for Disposition  [1] Swelling is painful to touch AND [2] no fever  Answer Assessment - Initial Assessment Questions 1. APPEARANCE of SWELLING: "What does it look like?"     Right between ear and chin 2. SIZE: "How large is the swelling?" (e.g., inches, cm; or compare to size of pinhead, tip of pen, eraser, coin, pea, grape, ping pong ball)      Quarter size  3. LOCATION: "Where is the swelling located?"     Right side neck  4. ONSET: "When did the swelling start?"     Last night  5. COLOR: "What color is it?" "Is there more than one color?"     No change  6. PAIN: "Is there any pain?" If Yes, ask: "How bad is the pain?" (e.g., scale 1-10; or mild, moderate, severe)     - NONE (0): no pain   - MILD (1-3): doesn't interfere with normal activities    - MODERATE (4-7): interferes with normal activities or awakens from sleep    - SEVERE (8-10): excruciating pain, unable to do any normal activities      No pain but  soreness to touch  7. ITCH: "Does it itch?" If Yes, ask: "How bad is the itch?"      no 8. CAUSE: "What do you think caused the swelling?" Not sure  9 OTHER SYMPTOMS: "Do you have any other symptoms?" (e.g., fever)     Achy yesterday no fever "hurts to swallow"  Protocols used: Skin Lump or Localized Swelling-A-AH

## 2023-05-20 ENCOUNTER — Ambulatory Visit
Admission: RE | Admit: 2023-05-20 | Discharge: 2023-05-20 | Disposition: A | Discharge status: Home / Self Care | Payer: PPO | Source: Ambulatory Visit | Attending: Internal Medicine | Admitting: Internal Medicine

## 2023-05-20 ENCOUNTER — Ambulatory Visit (INDEPENDENT_AMBULATORY_CARE_PROVIDER_SITE_OTHER): Payer: PPO | Admitting: Internal Medicine

## 2023-05-20 ENCOUNTER — Encounter: Payer: Self-pay | Admitting: Internal Medicine

## 2023-05-20 VITALS — BP 134/72 | HR 79 | Temp 95.3°F | Wt 163.0 lb

## 2023-05-20 DIAGNOSIS — R221 Localized swelling, mass and lump, neck: Secondary | ICD-10-CM | POA: Diagnosis not present

## 2023-05-20 MED ORDER — METHYLPREDNISOLONE ACETATE 80 MG/ML IJ SUSP
80.0000 mg | Freq: Once | INTRAMUSCULAR | Status: AC
Start: 2023-05-20 — End: 2023-05-20
  Administered 2023-05-20: 80 mg via INTRAMUSCULAR

## 2023-05-20 NOTE — Progress Notes (Signed)
Subjective:    Patient ID: [name], female    DOB: [birth year], 82 y.o.   MRN: [id number]  HPI  Patient presents to clinic today with complaint of a lump on the right side of her neck.  She noticed this 3 days ago.  She reports this area has not gotten bigger in size. She reports associated headache, but denies runny nose, nasal congestion, ear pain, sore throat or cough. She has tried tylenol OTC with minimal relief. She has no family history of lymphoma.  Review of Systems     Past Medical History:  Diagnosis Date   Arthritis    Cancer (HCC)    History of chicken pox    History of colon polyps    History of mouth cancer    Osteoarthritis of knee    Medial compartment, Right    Current Outpatient Medications  Medication Sig Dispense Refill   aspirin EC 81 MG tablet Take 81 mg by mouth daily.     atorvastatin (LIPITOR) 10 MG tablet Take 1 tablet (10 mg total) by mouth daily. 90 tablet 1   Blood Glucose Monitoring Suppl DEVI Use to check blood sugar twice a day.  DX R73.03. May substitute to any manufacturer covered by patient's insurance. 1 each 0   Calcium Carb-Cholecalciferol 600-800 MG-UNIT TABS Take 1 tablet by mouth 2 (two) times daily.      cetirizine (ZYRTEC) 10 MG tablet Take 10 mg by mouth daily.     clotrimazole-betamethasone (LOTRISONE) cream APPLY 1-2 TIMES A DAY AS NEEDED FOR SPOT TREATMENT FOR YEAST SKIN RASH 30 g 1   esomeprazole (NEXIUM) 20 MG capsule Take 1 capsule (20 mg total) by mouth 2 (two) times daily before a meal. 180 capsule 1   fluticasone (FLONASE) 50 MCG/ACT nasal spray Place 2 sprays into the nose at bedtime.      Glucosamine-Chondroitin (COSAMIN DS PO) Take 1 tablet by mouth 2 (two) times daily.     Glucose Blood (BLOOD GLUCOSE TEST STRIPS) STRP Use to check blood sugar twice a day.  DX R73.03. May substitute to any manufacturer covered by patient's insurance. 100 strip 0   Lancet Device MISC Use to check blood sugar twice a day.  DX R73.03.  May substitute to any manufacturer covered by patient's insurance. 1 each 0   Lancets Misc. MISC Use to check blood sugar twice a day.  DX R73.03. May substitute to any manufacturer covered by patient's insurance. 100 each 0   Multiple Vitamins-Minerals (MULTIVITAMIN WITH MINERALS) tablet Take 1 tablet by mouth every evening. Women's One-A-Day     nystatin (MYCOSTATIN/NYSTOP) powder APPLY TOPICALLY 3 TIMES A DAY 30 g 1   Omega-3 Fatty Acids (FISH OIL) 1000 MG CAPS Take 1,000 mg by mouth daily.     TURMERIC PO Take 538 mg by mouth daily.     No current facility-administered medications for this visit.    Allergies  Allergen Reactions   Ibuprofen Nausea And Vomiting and Other (See Comments)    GI upset, GERD   Azithromycin Hives    Itching, blotchy rash     Family History  Problem Relation Age of Onset   Breast cancer Neg Hx     Social History   Socioeconomic History   Marital status: Married    Spouse name: Lisanne Cobb   Number of children: 2   Years of education: Not on file   Highest education level: 12th grade  Occupational History   Occupation: Retired  Tobacco Use   Smoking status: Former    Current packs/day: 0.00    Types: Cigarettes    Quit date: 12/28/1986    Years since quitting: 36.4   Smokeless tobacco: Never  Vaping Use   Vaping status: Never Used  Substance and Sexual Activity   Alcohol use: No   Drug use: No   Sexual activity: Not Currently  Other Topics Concern   Not on file  Social History Narrative   Not on file   Social Determinants of Health   Financial Resource Strain: Low Risk  (03/11/2023)   Overall Financial Resource Strain (CARDIA)    Difficulty of Paying Living Expenses: Not very hard  Food Insecurity: No Food Insecurity (03/11/2023)   Hunger Vital Sign    Worried About Running Out of Food in the Last Year: Never true    Ran Out of Food in the Last Year: Never true  Transportation Needs: No Transportation Needs (03/11/2023)   PRAPARE -  Administrator, Civil Service (Medical): No    Lack of Transportation (Non-Medical): No  Physical Activity: Sufficiently Active (08/13/2022)   Exercise Vital Sign    Days of Exercise per Week: 7 days    Minutes of Exercise per Session: 30 min  Stress: No Stress Concern Present (03/11/2023)   [name]    Feeling of Stress : Not at all  Social Connections: Socially Integrated (03/11/2023)   Social Connection and Isolation Panel [NHANES]    Frequency of Communication with Friends and Family: More than three times a week    Frequency of Social Gatherings with Friends and Family: Once a week    Attends Religious Services: More than 4 times per year    Active Member of [employer] or Organizations: Yes    Attends Banker Meetings: Never    Marital Status: Married  Catering manager Violence: Not At Risk (08/13/2022)   Humiliation, Afraid, Rape, and Kick questionnaire    Fear of Current or Ex-Partner: No    Emotionally Abused: No    Physically Abused: No    Sexually Abused: No     Constitutional: Reports headache.  Denies fever, malaise, fatigue, or abrupt weight changes.  HEENT: Patient reports sore throat.  Denies eye pain, eye redness, ear pain, ringing in the ears, wax buildup, runny nose, nasal congestion, bloody nose. Respiratory: Denies difficulty breathing, shortness of breath, cough or sputum production.   Cardiovascular: Denies chest pain, chest tightness, palpitations or swelling in the hands or feet.  Gastrointestinal: Denies abdominal pain, bloating, constipation, diarrhea or blood in the stool.  GU: Denies urgency, frequency, pain with urination, burning sensation, blood in urine, odor or discharge. Musculoskeletal: Denies decrease in range of motion, difficulty with gait, muscle pain or joint pain and swelling.  Skin: Patient reports mass to right side of neck.  Denies redness, rashes, lesions or  ulcercations.  Neurological: Denies dizziness, difficulty with memory, difficulty with speech or problems with balance and coordination.  Psych: Denies anxiety, depression, SI/HI.  No other specific complaints in a complete review of systems (except as listed in HPI above).  Objective:   Physical Exam   BP 134/72 (BP Location: Left Arm, Patient Position: Sitting, Cuff Size: Normal)   Pulse 79   Temp (!) 95.3 F (35.2 C) (Temporal)   Wt 163 lb (73.9 kg)   LMP  (LMP Unknown)   SpO2 95%   BMI 30.80 kg/m   Wt Readings  from Last 3 Encounters:  03/13/23 169 lb (76.7 kg)  01/06/23 177 lb (80.3 kg)  09/06/22 187 lb (84.8 kg)    General: Appears her stated age, obese, in NAD. HEENT: Head: normal shape and size; Eyes: sclera white, no icterus, conjunctiva pink, PERRLA and EOMs intact; Ears: Tm's gray and intact, normal light reflex; Nose: mucosa pink and moist, septum midline; Throat/Mouth: Teeth present, mucosa pink and moist, no exudate, lesions or ulcerations noted.  Neck:  6 x 5 cm mass noted to the right medial neck. Cardiovascular: Normal rate and rhythm.  Pulmonary/Chest: Normal effort and positive vesicular breath sounds. No respiratory distress. No wheezes, rales or ronchi noted.  Musculoskeletal: No difficulty with gait.  Neurological: Alert and oriented. Coordination normal.      BMET    Component Value Date/Time   NA 142 03/13/2023 0832   K 4.2 03/13/2023 0832   CL 104 03/13/2023 0832   CO2 27 03/13/2023 0832   GLUCOSE 107 (H) 03/13/2023 0832   BUN 15 03/13/2023 0832   CREATININE 0.75 03/13/2023 0832   CALCIUM 10.0 03/13/2023 0832   GFRNONAA >60 06/16/2019 0337   GFRNONAA >89 06/24/2016 0853   GFRAA >60 06/16/2019 0337   GFRAA >89 06/24/2016 0853    Lipid Panel     Component Value Date/Time   CHOL 127 03/13/2023 0832   TRIG 101 03/13/2023 0832   HDL 59 03/13/2023 0832   CHOLHDL 2.2 03/13/2023 0832   VLDL 30.4 08/22/2020 1211   LDLCALC 50 03/13/2023 0832     CBC    Component Value Date/Time   WBC 7.0 03/13/2023 0832   RBC 4.81 03/13/2023 0832   HGB 14.4 03/13/2023 0832   HCT 44.5 03/13/2023 0832   PLT 159 03/13/2023 0832   MCV 92.5 03/13/2023 0832   MCV 86.6 06/24/2016 0909   MCH 29.9 03/13/2023 0832   MCHC 32.4 03/13/2023 0832   RDW 12.5 03/13/2023 0832    Hgb A1C Lab Results  Component Value Date   HGBA1C 6.0 (H) 03/13/2023           Assessment & Plan:  Mass of right side of neck  Rapid strep: Negative Depo-Medrol 80 mg IM x 1 for swelling Stat ultrasound soft tissue head and neck, 9 thyroid for further evaluation  RTC in 4 months for your annual exam Nicki Reaper, NP

## 2023-05-20 NOTE — Addendum Note (Signed)
Addended by: Kavin Leech E on: 05/20/2023 04:48 PM   Modules accepted: Orders

## 2023-05-21 ENCOUNTER — Telehealth: Payer: Self-pay

## 2023-05-21 ENCOUNTER — Ambulatory Visit
Admission: RE | Admit: 2023-05-21 | Discharge: 2023-05-21 | Disposition: A | Discharge status: Home / Self Care | Payer: PPO | Source: Ambulatory Visit | Attending: Internal Medicine | Admitting: Internal Medicine

## 2023-05-21 DIAGNOSIS — D4989 Neoplasm of unspecified behavior of other specified sites: Secondary | ICD-10-CM

## 2023-05-21 DIAGNOSIS — R221 Localized swelling, mass and lump, neck: Secondary | ICD-10-CM

## 2023-05-21 DIAGNOSIS — I6523 Occlusion and stenosis of bilateral carotid arteries: Secondary | ICD-10-CM | POA: Diagnosis not present

## 2023-05-21 DIAGNOSIS — C4490 Unspecified malignant neoplasm of skin, unspecified: Secondary | ICD-10-CM | POA: Diagnosis not present

## 2023-05-21 DIAGNOSIS — C069 Malignant neoplasm of mouth, unspecified: Secondary | ICD-10-CM | POA: Diagnosis not present

## 2023-05-21 MED ORDER — SODIUM CHLORIDE 0.9 % IV BOLUS
50.0000 mL | Freq: Once | INTRAVENOUS | Status: AC
  Administered 2023-05-21: 50 mL via INTRAVENOUS

## 2023-05-21 MED ORDER — IOHEXOL 300 MG/ML  SOLN
75.0000 mL | Freq: Once | INTRAMUSCULAR | Status: AC | PRN
  Administered 2023-05-21: 75 mL via INTRAVENOUS

## 2023-05-21 NOTE — Addendum Note (Signed)
Addended by: Lorre Munroe on: 05/21/2023 09:11 AM   Modules accepted: Orders

## 2023-05-21 NOTE — Telephone Encounter (Signed)
CT results that was done today, not yet reviewed by PCP.  Copied from CRM [id number]. Topic: General - Other >> May 21, 2023  3:03 PM Franchot Heidelberg wrote: Reason for CRM: Pt called for imaging results, please advise once PCP interpretation is available

## 2023-05-22 DIAGNOSIS — R221 Localized swelling, mass and lump, neck: Secondary | ICD-10-CM | POA: Diagnosis not present

## 2023-05-22 DIAGNOSIS — Z85818 Personal history of malignant neoplasm of other sites of lip, oral cavity, and pharynx: Secondary | ICD-10-CM | POA: Diagnosis not present

## 2023-05-22 NOTE — Telephone Encounter (Signed)
Results discussed with patient by this provider.  Questions answered.  Urgent referral to ENT placed

## 2023-05-22 NOTE — Addendum Note (Signed)
Addended by: Lorre Munroe on: 05/22/2023 07:45 AM   Modules accepted: Orders

## 2023-05-26 ENCOUNTER — Other Ambulatory Visit: Payer: Self-pay | Admitting: Otolaryngology

## 2023-05-26 DIAGNOSIS — R599 Enlarged lymph nodes, unspecified: Secondary | ICD-10-CM

## 2023-05-30 ENCOUNTER — Other Ambulatory Visit: Payer: Self-pay | Admitting: Student

## 2023-05-30 DIAGNOSIS — Z01812 Encounter for preprocedural laboratory examination: Secondary | ICD-10-CM

## 2023-05-30 NOTE — Progress Notes (Signed)
Irish Lack, MD sent to [facility] PROCEDURE / BIOPSY REVIEW Date: 05/26/23  Requested Biopsy site: Right cervical lymph node Reason for request: Enlarged, necrotic right cervical lymph node Imaging review: Best seen on CT of neck  Decision: Approved Imaging modality to perform: Ultrasound Schedule with: Moderate Sedation Schedule for: Any VIR  Additional comments:   Please contact me with questions, concerns, or if issue pertaining to this request arise.  Reola Calkins, MD [facility]

## 2023-05-30 NOTE — Progress Notes (Signed)
Patient for US guided Core RT Cervical LN Biopsy on Monday 06/02/2023, I called and spoke with the patient on the phone and gave pre-procedure instructions. Pt was made aware to be here at 12:30p and check in at the [facility]. Pt stated understanding.  Called 05/30/23

## 2023-06-02 ENCOUNTER — Ambulatory Visit
Admission: RE | Admit: 2023-06-02 | Discharge: 2023-06-02 | Disposition: A | Discharge status: Home / Self Care | Payer: PPO | Source: Ambulatory Visit | Attending: Otolaryngology | Admitting: Otolaryngology

## 2023-06-02 DIAGNOSIS — R599 Enlarged lymph nodes, unspecified: Secondary | ICD-10-CM

## 2023-06-02 DIAGNOSIS — Z85828 Personal history of other malignant neoplasm of skin: Secondary | ICD-10-CM | POA: Insufficient documentation

## 2023-06-02 DIAGNOSIS — R590 Localized enlarged lymph nodes: Secondary | ICD-10-CM | POA: Diagnosis not present

## 2023-06-02 DIAGNOSIS — R221 Localized swelling, mass and lump, neck: Secondary | ICD-10-CM | POA: Diagnosis not present

## 2023-06-02 DIAGNOSIS — R591 Generalized enlarged lymph nodes: Secondary | ICD-10-CM | POA: Diagnosis not present

## 2023-06-02 MED ORDER — LIDOCAINE HCL (PF) 1 % IJ SOLN
4.0000 mL | Freq: Once | INTRAMUSCULAR | Status: AC
  Administered 2023-06-02: 4 mL via INTRADERMAL
  Filled 2023-06-02: qty 4

## 2023-06-04 ENCOUNTER — Other Ambulatory Visit: Payer: Self-pay | Admitting: Otolaryngology

## 2023-06-04 DIAGNOSIS — R221 Localized swelling, mass and lump, neck: Secondary | ICD-10-CM

## 2023-06-09 ENCOUNTER — Other Ambulatory Visit: Payer: Self-pay | Admitting: Internal Medicine

## 2023-06-09 DIAGNOSIS — E782 Mixed hyperlipidemia: Secondary | ICD-10-CM

## 2023-06-11 ENCOUNTER — Ambulatory Visit
Admission: RE | Admit: 2023-06-11 | Discharge: 2023-06-11 | Disposition: A | Discharge status: Home / Self Care | Payer: PPO | Source: Ambulatory Visit | Attending: Otolaryngology | Admitting: Otolaryngology

## 2023-06-11 DIAGNOSIS — R221 Localized swelling, mass and lump, neck: Secondary | ICD-10-CM | POA: Diagnosis not present

## 2023-06-11 DIAGNOSIS — C760 Malignant neoplasm of head, face and neck: Secondary | ICD-10-CM | POA: Diagnosis not present

## 2023-06-11 LAB — GLUCOSE, CAPILLARY: Glucose-Capillary: 91 mg/dL (ref 70–99)

## 2023-06-11 MED ORDER — FLUDEOXYGLUCOSE F - 18 (FDG) INJECTION
8.4000 | Freq: Once | INTRAVENOUS | Status: AC | PRN
  Administered 2023-06-11: 8.88 via INTRAVENOUS

## 2023-06-11 NOTE — Telephone Encounter (Signed)
Requested too soon. Last refilled 03/13/23 #90, 1RF Requested Prescriptions  Pending Prescriptions Disp Refills   atorvastatin (LIPITOR) 10 MG tablet [Pharmacy Med Name: ATORVASTATIN 10 MG TABLET] 90 tablet 1    Sig: TAKE 1 TABLET BY MOUTH EVERY DAY     Cardiovascular:  Antilipid - Statins Failed - 06/09/2023  1:28 PM      Failed - Lipid Panel in normal range within the last 12 months    Cholesterol  Date Value Ref Range Status  03/13/2023 127 <200 mg/dL Final   LDL Cholesterol (Calc)  Date Value Ref Range Status  03/13/2023 50 mg/dL (calc) Final    Comment:    Reference range: <100 . Desirable range <100 mg/dL for primary prevention;   <70 mg/dL for patients with CHD or diabetic patients  with > or = 2 CHD risk factors. Marland Kitchen LDL-C is now calculated using the Martin-Hopkins  calculation, which is a validated novel method providing  better accuracy than the Friedewald equation in the  estimation of LDL-C.  Horald Pollen et al. Lenox Ahr. 1610;960(45): 2061-2068  (http://education.[facility].com/faq/FAQ164)    HDL  Date Value Ref Range Status  03/13/2023 59 > OR = 50 mg/dL Final   Triglycerides  Date Value Ref Range Status  03/13/2023 101 <150 mg/dL Final         Passed - Patient is not pregnant      Passed - Valid encounter within last 12 months    Recent Outpatient Visits           3 weeks ago Mass of right side of neck   [place] [facility] [place], Salvadore Oxford, NP   3 months ago Prediabetes   [place] [facility] [place], Salvadore Oxford, NP   5 months ago Candidal intertrigo   [place] [facility] [place], Netta Neat, DO   9 months ago Encounter for general adult medical examination with abnormal findings   [place] [facility] [place], Salvadore Oxford, NP   1 year ago Encounter for general adult medical examination with abnormal findings   [place] [facility] [place], Salvadore Oxford, NP        Future Appointments             In 3 months Baity, Salvadore Oxford, NP  [facility], [facility]

## 2023-06-18 DIAGNOSIS — R221 Localized swelling, mass and lump, neck: Secondary | ICD-10-CM | POA: Diagnosis not present

## 2023-06-18 DIAGNOSIS — Z85819 Personal history of malignant neoplasm of unspecified site of lip, oral cavity, and pharynx: Secondary | ICD-10-CM | POA: Diagnosis not present

## 2023-06-24 DIAGNOSIS — R221 Localized swelling, mass and lump, neck: Secondary | ICD-10-CM | POA: Diagnosis not present

## 2023-06-30 DIAGNOSIS — M542 Cervicalgia: Secondary | ICD-10-CM | POA: Diagnosis not present

## 2023-06-30 DIAGNOSIS — R490 Dysphonia: Secondary | ICD-10-CM | POA: Diagnosis not present

## 2023-06-30 DIAGNOSIS — F17210 Nicotine dependence, cigarettes, uncomplicated: Secondary | ICD-10-CM | POA: Diagnosis not present

## 2023-06-30 DIAGNOSIS — R221 Localized swelling, mass and lump, neck: Secondary | ICD-10-CM | POA: Diagnosis not present

## 2023-07-09 DIAGNOSIS — R221 Localized swelling, mass and lump, neck: Secondary | ICD-10-CM | POA: Diagnosis not present

## 2023-07-12 ENCOUNTER — Other Ambulatory Visit: Payer: Self-pay | Admitting: Internal Medicine

## 2023-07-14 NOTE — Telephone Encounter (Signed)
Requested Prescriptions  Pending Prescriptions Disp Refills   ONETOUCH VERIO test strip [Pharmacy Med Name: ONE TOUCH VERIO TEST STRIP] 100 strip 0    Sig: USE TO CHECK BLOOD SUGAR TWICE A DAY. DX R73.03. MAY SUBSTITUTE TO ANY MANUFACTURER COVERED BY PATIENT'S INSURANCE.     Endocrinology: Diabetes - Testing Supplies Passed - 07/12/2023 11:04 AM      Passed - Valid encounter within last 12 months    Recent Outpatient Visits           1 month ago Mass of right side of neck   [place] [facility] [place], Salvadore Oxford, NP   4 months ago Prediabetes   [place] [facility] [place], Salvadore Oxford, NP   6 months ago Candidal intertrigo   [place] [facility] Smitty Cords, DO   10 months ago Encounter for general adult medical examination with abnormal findings   [place] [facility] [place], Salvadore Oxford, NP   1 year ago Encounter for general adult medical examination with abnormal findings   [place] [facility] [place], Salvadore Oxford, NP       Future Appointments             In 2 months Baity, Salvadore Oxford, NP [facility], [facility]

## 2023-07-17 DIAGNOSIS — E669 Obesity, unspecified: Secondary | ICD-10-CM | POA: Diagnosis not present

## 2023-07-17 DIAGNOSIS — R490 Dysphonia: Secondary | ICD-10-CM | POA: Diagnosis not present

## 2023-07-17 DIAGNOSIS — Z6831 Body mass index (BMI) 31.0-31.9, adult: Secondary | ICD-10-CM | POA: Diagnosis not present

## 2023-07-17 DIAGNOSIS — K219 Gastro-esophageal reflux disease without esophagitis: Secondary | ICD-10-CM | POA: Diagnosis not present

## 2023-07-17 DIAGNOSIS — R221 Localized swelling, mass and lump, neck: Secondary | ICD-10-CM | POA: Diagnosis not present

## 2023-07-17 DIAGNOSIS — C801 Malignant (primary) neoplasm, unspecified: Secondary | ICD-10-CM | POA: Diagnosis not present

## 2023-07-17 DIAGNOSIS — C439 Malignant melanoma of skin, unspecified: Secondary | ICD-10-CM | POA: Diagnosis not present

## 2023-07-17 DIAGNOSIS — K0510 Chronic gingivitis, plaque induced: Secondary | ICD-10-CM | POA: Diagnosis not present

## 2023-07-17 DIAGNOSIS — C7989 Secondary malignant neoplasm of other specified sites: Secondary | ICD-10-CM | POA: Diagnosis not present

## 2023-07-17 DIAGNOSIS — E785 Hyperlipidemia, unspecified: Secondary | ICD-10-CM | POA: Diagnosis not present

## 2023-07-17 DIAGNOSIS — G8929 Other chronic pain: Secondary | ICD-10-CM | POA: Diagnosis not present

## 2023-07-21 DIAGNOSIS — Z09 Encounter for follow-up examination after completed treatment for conditions other than malignant neoplasm: Secondary | ICD-10-CM | POA: Diagnosis not present

## 2023-07-21 DIAGNOSIS — C760 Malignant neoplasm of head, face and neck: Secondary | ICD-10-CM | POA: Diagnosis not present

## 2023-08-04 DIAGNOSIS — M542 Cervicalgia: Secondary | ICD-10-CM | POA: Diagnosis not present

## 2023-08-04 DIAGNOSIS — L989 Disorder of the skin and subcutaneous tissue, unspecified: Secondary | ICD-10-CM | POA: Diagnosis not present

## 2023-08-04 DIAGNOSIS — Z881 Allergy status to other antibiotic agents status: Secondary | ICD-10-CM | POA: Diagnosis not present

## 2023-08-04 DIAGNOSIS — F17200 Nicotine dependence, unspecified, uncomplicated: Secondary | ICD-10-CM | POA: Diagnosis not present

## 2023-08-04 DIAGNOSIS — R221 Localized swelling, mass and lump, neck: Secondary | ICD-10-CM | POA: Diagnosis not present

## 2023-08-04 DIAGNOSIS — Z7982 Long term (current) use of aspirin: Secondary | ICD-10-CM | POA: Diagnosis not present

## 2023-08-04 DIAGNOSIS — L820 Inflamed seborrheic keratosis: Secondary | ICD-10-CM | POA: Diagnosis not present

## 2023-08-05 DIAGNOSIS — C434 Malignant melanoma of scalp and neck: Secondary | ICD-10-CM | POA: Diagnosis not present

## 2023-08-05 DIAGNOSIS — C439 Malignant melanoma of skin, unspecified: Secondary | ICD-10-CM | POA: Diagnosis not present

## 2023-08-05 DIAGNOSIS — Z79899 Other long term (current) drug therapy: Secondary | ICD-10-CM | POA: Diagnosis not present

## 2023-08-11 ENCOUNTER — Encounter: Payer: Self-pay | Admitting: Oncology

## 2023-08-11 ENCOUNTER — Inpatient Hospital Stay: Payer: PPO | Attending: Oncology

## 2023-08-11 ENCOUNTER — Inpatient Hospital Stay (HOSPITAL_BASED_OUTPATIENT_CLINIC_OR_DEPARTMENT_OTHER): Payer: PPO | Admitting: Oncology

## 2023-08-11 VITALS — BP 137/62 | HR 73 | Temp 98.5°F | Resp 16 | Ht 61.0 in | Wt 167.0 lb

## 2023-08-11 DIAGNOSIS — Z79899 Other long term (current) drug therapy: Secondary | ICD-10-CM | POA: Diagnosis not present

## 2023-08-11 DIAGNOSIS — C770 Secondary and unspecified malignant neoplasm of lymph nodes of head, face and neck: Secondary | ICD-10-CM | POA: Insufficient documentation

## 2023-08-11 DIAGNOSIS — C439 Malignant melanoma of skin, unspecified: Secondary | ICD-10-CM | POA: Diagnosis not present

## 2023-08-11 DIAGNOSIS — Z87891 Personal history of nicotine dependence: Secondary | ICD-10-CM | POA: Diagnosis not present

## 2023-08-11 DIAGNOSIS — Z7189 Other specified counseling: Secondary | ICD-10-CM

## 2023-08-11 DIAGNOSIS — Z7982 Long term (current) use of aspirin: Secondary | ICD-10-CM | POA: Diagnosis not present

## 2023-08-11 MED ORDER — ONDANSETRON HCL 8 MG PO TABS: 8.0000 mg | ORAL_TABLET | Freq: Three times a day (TID) | ORAL | 1 refills | Status: AC | PRN

## 2023-08-11 MED ORDER — PROCHLORPERAZINE MALEATE 10 MG PO TABS: 10.0000 mg | ORAL_TABLET | Freq: Four times a day (QID) | ORAL | 1 refills | Status: DC | PRN

## 2023-08-11 MED ORDER — LIDOCAINE-PRILOCAINE 2.5-2.5 % EX CREA: TOPICAL_CREAM | CUTANEOUS | 3 refills | Status: DC

## 2023-08-11 NOTE — Progress Notes (Signed)
Hematology/Oncology Consult note [facility] Telephone:([phone] [phone]  Patient Care Team: Lorre Munroe, NP as PCP - General (Internal Medicine) Creig Hines, MD as Consulting Physician (Oncology)   Name of the patient: [name]  [id number]  December 28, 1940    Reason for referral-new diagnosis of malignant melanoma of unknown primary   Referring physician-Dr. Sheryle Spray, MD  Date of visit: 08/11/23   History of presenting illness-patient is a 82-year Old female who was seen by [facility] ENT for concerns of neck swelling in August 2024.  This was followed by a PET CT scan on 06/11/2023 which showed a 2.8 cm right level 2 lymph node with an SUV of 31.  Peripheral nodular hypermetabolism compatible with irregular rim enhancement.  12 mm nodular component along the inferior aspect of the lesion measuring SUV 10.1.  No other evidence of distant metastatic disease.  Core biopsy showed necrosis but was not conclusive for malignancy.  She was then referred to [facility] and underwent definitive excision of the right cervical lymph node on 07/17/2023.  Pathology showed malignant neoplasm with extensive necrosis consistent with metastatic melanoma.  BRAF negative surgical margins negative.  Right level 2A2B3 and 4 lymph nodes and right tonsil as well as right retromolar trigone biopsy all negative for malignancy. She was staged as stage IIIc T0 N2b M0.  Pros and cons of adjuvant immunotherapy discussed with the patient and patient chose to receive care closer to home in [place].  She will also be undergoing radiation therapy to the neck at [facility] soon.  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  HENT:  Negative for  congestion, ear discharge and nosebleeds.   Eyes:  Negative for blurred vision.  Respiratory:  Negative for cough, hemoptysis, sputum production, shortness of breath and wheezing.   Cardiovascular:  Negative for chest pain, palpitations, orthopnea and claudication.  Gastrointestinal:  Negative for abdominal pain, blood in stool, constipation, diarrhea, heartburn, melena, nausea and vomiting.  Genitourinary:  Negative for dysuria, flank pain, frequency, hematuria and urgency.  Musculoskeletal:  Negative for back pain, joint pain and myalgias.  Skin:  Negative for rash.  Neurological:  Negative for dizziness, tingling, focal weakness, seizures, weakness and headaches.  Endo/Heme/Allergies:  Does not bruise/bleed easily.  Psychiatric/Behavioral:  Negative for depression and suicidal ideas. The patient does not have insomnia.     Allergies  Allergen Reactions   Ibuprofen Nausea And Vomiting and Other (See Comments)    GI upset, GERD   Azithromycin Hives    Itching, blotchy rash    Macrobid [Nitrofurantoin] Nausea And Vomiting    Patient Active Problem List   Diagnosis Date Noted   Melanoma of skin (HCC) 08/11/2023   Prediabetes 03/13/2023   HLD (hyperlipidemia) 09/03/2021   Class 1 obesity due to excess calories with body mass index (BMI) of 31.0 to 31.9 in adult 09/03/2021   Primary osteoarthritis involving multiple joints 08/22/2020   OAB (overactive bladder) 08/22/2020   Acid reflux 04/06/2018     Past Medical History:  Diagnosis Date   Allergy    Arthritis    Cancer (HCC)    History of chicken pox    History of colon polyps    History of mouth cancer  Osteoarthritis of knee    Medial compartment, Right     Past Surgical History:  Procedure Laterality Date   APPENDECTOMY     DILATION AND CURETTAGE OF UTERUS     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   KNEE ARTHROSCOPY W/ MENISCAL REPAIR Left    PARTIAL KNEE ARTHROPLASTY Right 09/16/2017   PARTIAL KNEE ARTHROPLASTY  Right 09/16/2017   Procedure: RIGHT UNICOMPARTMENTAL KNEE;  Surgeon: Kathryne Hitch, MD;  Location: [facility] OR;  Service: Orthopedics;  Laterality: Right;   SHOULDER ARTHROSCOPY Left 11/2018   TOTAL KNEE REVISION Right 06/15/2019   Procedure: RIGHT UNICOMPARTMENTAL ARTHROPLASTY TO TOTAL KNEE ARTHROPLASTY;  Surgeon: Kathryne Hitch, MD;  Location: [facility] OR;  Service: Orthopedics;  Laterality: Right;   TUBAL LIGATION      Social History   Socioeconomic History   Marital status: Married    Spouse name: Acadia Thammavong   Number of children: 2   Years of education: Not on file   Highest education level: 12th grade  Occupational History   Occupation: Retired  Tobacco Use   Smoking status: Former    Current packs/day: 0.00    Types: Cigarettes    Quit date: 12/28/1986    Years since quitting: 36.6   Smokeless tobacco: Never  Vaping Use   Vaping status: Never Used  Substance and Sexual Activity   Alcohol use: No   Drug use: No   Sexual activity: Not Currently  Other Topics Concern   Not on file  Social History Narrative   Not on file   Social Determinants of Health   Financial Resource Strain: Low Risk  (03/11/2023)   Overall Financial Resource Strain (CARDIA)    Difficulty of Paying Living Expenses: Not very hard  Food Insecurity: No Food Insecurity (03/11/2023)   Hunger Vital Sign    Worried About Running Out of Food in the Last Year: Never true    Ran Out of Food in the Last Year: Never true  Transportation Needs: No Transportation Needs (03/11/2023)   PRAPARE - Administrator, Civil Service (Medical): No    Lack of Transportation (Non-Medical): No  Physical Activity: Sufficiently Active (08/13/2022)   Exercise Vital Sign    Days of Exercise per Week: 7 days    Minutes of Exercise per Session: 30 min  Stress: No Stress Concern Present (03/11/2023)   [name]    Feeling of Stress : Not at all   Social Connections: Socially Integrated (03/11/2023)   Social Connection and Isolation Panel [NHANES]    Frequency of Communication with Friends and Family: More than three times a week    Frequency of Social Gatherings with Friends and Family: Once a week    Attends Religious Services: More than 4 times per year    Active Member of [employer] or Organizations: Yes    Attends Banker Meetings: Never    Marital Status: Married  Catering manager Violence: Not At Risk (08/13/2022)   Humiliation, Afraid, Rape, and Kick questionnaire    Fear of Current or Ex-Partner: No    Emotionally Abused: No    Physically Abused: No    Sexually Abused: No     Family History  Problem Relation Age of Onset   Breast cancer Neg Hx      Current Outpatient Medications:    aspirin EC 81 MG tablet, Take 81 mg by mouth daily., Disp: , Rfl:    atorvastatin (LIPITOR)  10 MG tablet, Take 1 tablet (10 mg total) by mouth daily., Disp: 90 tablet, Rfl: 1   Blood Glucose Monitoring Suppl DEVI, Use to check blood sugar twice a day.  DX R73.03. May substitute to any manufacturer covered by patient's insurance., Disp: 1 each, Rfl: 0   Calcium Carb-Cholecalciferol 600-800 MG-UNIT TABS, Take 1 tablet by mouth 2 (two) times daily. , Disp: , Rfl:    cetirizine (ZYRTEC) 10 MG tablet, Take 10 mg by mouth daily., Disp: , Rfl:    clotrimazole-betamethasone (LOTRISONE) cream, APPLY 1-2 TIMES A DAY AS NEEDED FOR SPOT TREATMENT FOR YEAST SKIN RASH, Disp: 30 g, Rfl: 1   esomeprazole (NEXIUM) 20 MG capsule, Take 1 capsule (20 mg total) by mouth 2 (two) times daily before a meal., Disp: 180 capsule, Rfl: 1   fluticasone (FLONASE) 50 MCG/ACT nasal spray, Place 2 sprays into the nose at bedtime. , Disp: , Rfl:    Glucosamine-Chondroitin (COSAMIN DS PO), Take 1 tablet by mouth 2 (two) times daily., Disp: , Rfl:    Lancet Device MISC, Use to check blood sugar twice a day.  DX R73.03. May substitute to any manufacturer covered by  patient's insurance., Disp: 1 each, Rfl: 0   Lancets Misc. MISC, Use to check blood sugar twice a day.  DX R73.03. May substitute to any manufacturer covered by patient's insurance., Disp: 100 each, Rfl: 0   Multiple Vitamins-Minerals (MULTIVITAMIN WITH MINERALS) tablet, Take 1 tablet by mouth every evening. Women's One-A-Day, Disp: , Rfl:    nystatin (MYCOSTATIN/NYSTOP) powder, APPLY TOPICALLY 3 TIMES A DAY, Disp: 30 g, Rfl: 1   Omega-3 Fatty Acids (FISH OIL) 1000 MG CAPS, Take 1,000 mg by mouth daily., Disp: , Rfl:    ONETOUCH VERIO test strip, USE TO CHECK BLOOD SUGAR TWICE A DAY. DX R73.03. MAY SUBSTITUTE TO ANY MANUFACTURER COVERED BY PATIENT'S INSURANCE., Disp: 100 strip, Rfl: 0   TURMERIC PO, Take 538 mg by mouth daily., Disp: , Rfl:    Physical exam:  Vitals:   08/11/23 1415  BP: 137/62  Pulse: 73  Resp: 16  Temp: 98.5 F (36.9 C)  TempSrc: Tympanic  SpO2: 97%  Weight: 167 lb (75.8 kg)  Height: 5\' 1"  (1.549 m)   Physical Exam Neck:     Comments: Surgical scar over right cervical region healing well Cardiovascular:     Rate and Rhythm: Normal rate and regular rhythm.     Heart sounds: Normal heart sounds.  Pulmonary:     Effort: Pulmonary effort is normal.     Breath sounds: Normal breath sounds.  Abdominal:     General: Bowel sounds are normal.     Palpations: Abdomen is soft.  Skin:    General: Skin is warm and dry.  Neurological:     Mental Status: She is alert and oriented to person, place, and time.           Latest Ref Rng & Units 03/13/2023    8:32 AM  CMP  Glucose 65 - 99 mg/dL 403   BUN 7 - 25 mg/dL 15   Creatinine 4.74 - 0.95 mg/dL 2.59   Sodium 563 - 875 mmol/L 142   Potassium 3.5 - 5.3 mmol/L 4.2   Chloride 98 - 110 mmol/L 104   CO2 20 - 32 mmol/L 27   Calcium 8.6 - 10.4 mg/dL 64.3   Total Protein 6.1 - 8.1 g/dL 7.1   Total Bilirubin 0.2 - 1.2 mg/dL 0.4   AST 10 -  35 U/L 20   ALT 6 - 29 U/L 18       Latest Ref Rng & Units 03/13/2023    8:32  AM  CBC  WBC 3.8 - 10.8 Thousand/uL 7.0   Hemoglobin 11.7 - 15.5 g/dL 78.2   Hematocrit 95.6 - 45.0 % 44.5   Platelets 140 - 400 Thousand/uL 159     Assessment and plan- Patient is a 82 y.o. 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  This was surgically excised and was consistent with malignant melanoma.  There is no evidence of disease elsewhere and is unclear as to where the primary is.  Patient was seen by [facility] melanoma team and was staged as IIIC [id number].   Patient is also met with [facility] radiation oncology team and at this point is unclear as to when she is going to start adjuvant radiation.  Patient prefers to get her staging MRI brain here at [place] instead of going to [facility] and I will try to coordinate that for her.  We discussed the utility of adjuvant immunotherapy in the setting of stage IIIc melanoma.  She is BRAF negative and therefore BRAF inhibitors are not options for her at this time.  Potential options at this time include adjuvant nivolumab or adjuvant pembrolizumab. Pembrolizumab improved three-year RFS in patients with stage IIIB and stage IIIC disease (66 versus 47 percent, HR 0.56, 99% CI 0.39-0.81; 54 versus 32 percent, HR 0.57, 99% CI 0.4-0.81, respectively) and trended towards RFS benefit in those with stage IIIA disease, specifically those with >=1 mm of disease in the lymph nodes (81 versus 66 percent, HR 0.5, 99% CI 0.22-1.16   Discussed risks and benefits of pembrolizumab including all but not limited to autoimmune side effect such as colitis pneumonitis endocrinopathies, skin rash as well as need to monitor kidney and liver functions.  Treatment will be given with a curative intent.  Patient understands and agrees to proceed as planned.  Will plan for port placement and chemo teach.  I will coordinate the start of  pembrolizumab based on her start date for radiation.  Her surgery was on 07/17/2023 and therefore it would be prudent to start adjuvant treatment as soon as possible.  I will be reaching out to her [facility] team about this   Cancer Staging  Melanoma of skin [facility]) Staging form: Melanoma of the Skin, AJCC 8th Edition - Pathologic: Stage IIIC (pT0, pN2b, cM0) - Signed by Creig Hines, MD on 08/11/2023     Thank you for this kind referral and the opportunity to participate in the care of this patient   Visit Diagnosis 1. Melanoma of skin (HCC)   2. Goals of care, counseling/discussion     Dr. Owens Shark, MD, MPH [facility] [phone] 08/11/2023

## 2023-08-11 NOTE — Progress Notes (Signed)
START ON PATHWAY REGIMEN - Melanoma and Other Skin Cancers     A cycle is every 21 days:     Pembrolizumab   **Always confirm dose/schedule in your pharmacy ordering system**  Patient Characteristics: Melanoma, Cutaneous/Unknown Primary, Postoperative without Neoadjuvant Therapy, M0 (Pathologic Staging), Any pT, pN+, BRAF V600 Wild Type / BRAF V600 Results Pending or Unknown Disease Classification: Melanoma Disease Subtype: Cutaneous BRAF V600 Mutation Status: BRAF V600 Wild Type (No Mutation) Therapeutic Status: Postoperative without Neoadjuvant Therapy, M0 (Pathologic Staging) AJCC T Category: pT0 AJCC N Category: pN2b AJCC M Category: cM0 AJCC 8 Stage Grouping: IIIC Intent of Therapy: Curative Intent, Discussed with Patient

## 2023-08-12 ENCOUNTER — Other Ambulatory Visit: Payer: Self-pay

## 2023-08-13 DIAGNOSIS — L814 Other melanin hyperpigmentation: Secondary | ICD-10-CM | POA: Diagnosis not present

## 2023-08-13 DIAGNOSIS — L821 Other seborrheic keratosis: Secondary | ICD-10-CM | POA: Diagnosis not present

## 2023-08-13 DIAGNOSIS — D225 Melanocytic nevi of trunk: Secondary | ICD-10-CM | POA: Diagnosis not present

## 2023-08-14 ENCOUNTER — Other Ambulatory Visit: Payer: PPO

## 2023-08-14 DIAGNOSIS — C434 Malignant melanoma of scalp and neck: Secondary | ICD-10-CM | POA: Diagnosis not present

## 2023-08-14 DIAGNOSIS — Z510 Encounter for antineoplastic radiation therapy: Secondary | ICD-10-CM | POA: Diagnosis not present

## 2023-08-15 ENCOUNTER — Encounter: Payer: Self-pay | Admitting: Oncology

## 2023-08-15 ENCOUNTER — Telehealth: Payer: Self-pay | Admitting: *Deleted

## 2023-08-15 NOTE — Telephone Encounter (Signed)
Patient called reporting that her radiation therapy doctor at [facility] said that it is ok for Dr Smith Robert to start her injection,  at least one of them before she starts radiation therapy and that he needs to speak with Dr Smith Robert. Patient request a return call to discuss this matter

## 2023-08-15 NOTE — Telephone Encounter (Signed)
Can you ask her when does she start RT and for how long so I can dcide what to do with [place]

## 2023-08-18 ENCOUNTER — Other Ambulatory Visit: Payer: Self-pay | Admitting: *Deleted

## 2023-08-19 ENCOUNTER — Encounter: Payer: Self-pay | Admitting: *Deleted

## 2023-08-19 ENCOUNTER — Ambulatory Visit
Admission: RE | Admit: 2023-08-19 | Discharge: 2023-08-19 | Disposition: A | Discharge status: Home / Self Care | Payer: PPO | Source: Ambulatory Visit | Attending: Oncology | Admitting: Oncology

## 2023-08-19 ENCOUNTER — Telehealth: Payer: Self-pay | Admitting: *Deleted

## 2023-08-19 DIAGNOSIS — C439 Malignant melanoma of skin, unspecified: Secondary | ICD-10-CM | POA: Diagnosis not present

## 2023-08-19 DIAGNOSIS — C434 Malignant melanoma of scalp and neck: Secondary | ICD-10-CM | POA: Diagnosis not present

## 2023-08-19 DIAGNOSIS — I6782 Cerebral ischemia: Secondary | ICD-10-CM | POA: Diagnosis not present

## 2023-08-19 MED ORDER — GADOBUTROL 1 MMOL/ML IV SOLN
7.5000 mL | Freq: Once | INTRAVENOUS | Status: AC | PRN
  Administered 2023-08-19: 7.5 mL via INTRAVENOUS

## 2023-08-19 NOTE — Telephone Encounter (Signed)
I called and spoke to pt. About the portacath insertion and went over  date  11/14 arrive at 10:30 for 11:30 procedure and gave her how to get there to heart and vascular, NPO instructions . Pt. Has to have a driver because of sedation. She was ok with all of this and she wanted me to share the info. By my chart and that was sent also

## 2023-08-20 ENCOUNTER — Ambulatory Visit: Payer: PPO | Admitting: Oncology

## 2023-08-20 ENCOUNTER — Ambulatory Visit: Payer: PPO

## 2023-08-20 ENCOUNTER — Other Ambulatory Visit (HOSPITAL_COMMUNITY): Payer: Self-pay | Admitting: Student

## 2023-08-20 ENCOUNTER — Other Ambulatory Visit: Payer: PPO

## 2023-08-20 NOTE — H&P (Signed)
Chief Complaint: Patient was seen in consultation today for port a catheter placement at the request of [name]  Referring Physician(s): [name]  Supervising Physician: Gilmer Mor  Patient Status: [facility] - Out-pt  History of Present Illness: [name] is a 82 y.o. 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 She was complaining of neck swelling and found to have a right neck mass s/p PET and biopsy which was inconclusive and yielded necrotic tissue, s/p surgical excision, pathology consistent with malignant melanoma. Patient has been seen by oncology and is here today for a port a catheter placement for treatment in addition to radiation.  Past Medical History:  Diagnosis Date   Allergy    Arthritis    Cancer (HCC)    History of chicken pox    History of colon polyps    History of mouth cancer    Osteoarthritis of knee    Medial compartment, Right    Past Surgical History:  Procedure Laterality Date   APPENDECTOMY     DILATION AND CURETTAGE OF UTERUS     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   KNEE ARTHROSCOPY W/ MENISCAL REPAIR Left    PARTIAL KNEE ARTHROPLASTY Right 09/16/2017   PARTIAL KNEE ARTHROPLASTY Right 09/16/2017   Procedure: RIGHT UNICOMPARTMENTAL KNEE;  Surgeon: Kathryne Hitch, MD;  Location: [facility] OR;  Service: Orthopedics;  Laterality: Right;   SHOULDER ARTHROSCOPY Left 11/2018   TOTAL KNEE REVISION Right 06/15/2019   Procedure: RIGHT UNICOMPARTMENTAL ARTHROPLASTY TO TOTAL KNEE ARTHROPLASTY;  Surgeon: Kathryne Hitch, MD;  Location: [facility] OR;  Service: Orthopedics;  Laterality: Right;   TUBAL LIGATION      Allergies: Ibuprofen, Azithromycin, and Macrobid [nitrofurantoin]  Medications: Prior to Admission medications   Medication Sig Start Date End Date Taking? Authorizing Provider  aspirin EC 81 MG tablet Take 81 mg by mouth daily.    [provider]  atorvastatin (LIPITOR)  10 MG tablet Take 1 tablet (10 mg total) by mouth daily. 03/13/23   Lorre Munroe, NP  Blood Glucose Monitoring Suppl DEVI Use to check blood sugar twice a day.  DX R73.03. May substitute to any manufacturer covered by patient's insurance. 04/04/23   Lorre Munroe, NP  Calcium Carb-Cholecalciferol 600-800 MG-UNIT TABS Take 1 tablet by mouth 2 (two) times daily.     [provider]  cetirizine (ZYRTEC) 10 MG tablet Take 10 mg by mouth daily.    [provider]  clotrimazole-betamethasone (LOTRISONE) cream APPLY 1-2 TIMES A DAY AS NEEDED FOR SPOT TREATMENT FOR YEAST SKIN RASH 05/16/23   Lorre Munroe, NP  esomeprazole (NEXIUM) 20 MG capsule Take 1 capsule (20 mg total) by mouth 2 (two) times daily before a meal. 03/13/23   Baity, Salvadore Oxford, NP  fluticasone (FLONASE) 50 MCG/ACT nasal spray Place 2 sprays into the nose at bedtime.     [provider]  Glucosamine-Chondroitin (COSAMIN DS PO) Take 1 tablet by mouth 2 (two) times daily.    [provider]  Lancet Device MISC Use to check blood sugar twice a day.  DX R73.03. May substitute to any manufacturer covered by patient's insurance. 04/04/23   Lorre Munroe, NP  Lancets Misc. MISC Use to check blood sugar twice a day.  DX R73.03. May substitute to any manufacturer covered by patient's insurance. 04/04/23   Lorre Munroe, NP  lidocaine-prilocaine (EMLA) cream Apply to affected area once 08/11/23  Creig Hines, MD  Multiple Vitamins-Minerals (MULTIVITAMIN WITH MINERALS) tablet Take 1 tablet by mouth every evening. Women's One-A-Day    [provider]  nystatin (MYCOSTATIN/NYSTOP) powder APPLY TOPICALLY 3 TIMES A DAY 05/16/23   Lorre Munroe, NP  Omega-3 Fatty Acids (FISH OIL) 1000 MG CAPS Take 1,000 mg by mouth daily.    [provider]  ondansetron (ZOFRAN) 8 MG tablet Take 1 tablet (8 mg total) by mouth every 8 (eight) hours as needed for nausea or vomiting. 08/11/23   Creig Hines, MD  ONETOUCH  VERIO test strip USE TO CHECK BLOOD SUGAR TWICE A DAY. DX R73.03. MAY SUBSTITUTE TO ANY MANUFACTURER COVERED BY PATIENT'S INSURANCE. 07/14/23   Lorre Munroe, NP  prochlorperazine (COMPAZINE) 10 MG tablet Take 1 tablet (10 mg total) by mouth every 6 (six) hours as needed for nausea or vomiting. 08/11/23   Creig Hines, MD  TURMERIC PO Take 538 mg by mouth daily.    [provider]     Family History  Problem Relation Age of Onset   Breast cancer Neg Hx     Social History   Socioeconomic History   Marital status: Married    Spouse name: [name]   Number of children: 2   Years of education: Not on file   Highest education level: 12th grade  Occupational History   Occupation: Retired  Tobacco Use   Smoking status: Former    Current packs/day: 0.00    Types: Cigarettes    Quit date: 12/28/1986    Years since quitting: 36.6   Smokeless tobacco: Never  Vaping Use   Vaping status: Never Used  Substance and Sexual Activity   Alcohol use: No   Drug use: No   Sexual activity: Not Currently  Other Topics Concern   Not on file  Social History Narrative   Not on file   Social Determinants of Health   Financial Resource Strain: Low Risk  (03/11/2023)   Overall Financial Resource Strain (CARDIA)    Difficulty of Paying Living Expenses: Not very hard  Food Insecurity: No Food Insecurity (08/11/2023)   Hunger Vital Sign    Worried About Running Out of Food in the Last Year: Never true    Ran Out of Food in the Last Year: Never true  Transportation Needs: No Transportation Needs (03/11/2023)   PRAPARE - Administrator, Civil Service (Medical): No    Lack of Transportation (Non-Medical): No  Physical Activity: Sufficiently Active (08/13/2022)   Exercise Vital Sign    Days of Exercise per Week: 7 days    Minutes of Exercise per Session: 30 min  Stress: No Stress Concern Present (03/11/2023)   [employer] of Occupational Health - Occupational Stress  Questionnaire    Feeling of Stress : Not at all  Social Connections: Socially Integrated (03/11/2023)   Social Connection and Isolation Panel [NHANES]    Frequency of Communication with Friends and Family: More than three times a week    Frequency of Social Gatherings with Friends and Family: Once a week    Attends Religious Services: More than 4 times per year    Active Member of [employer] or Organizations: Yes    Attends Banker Meetings: Never    Marital Status: Married    Review of Systems: A 12 point ROS discussed and pertinent positives are indicated in the HPI above.  All other systems are negative.  Review of Systems  Vital  Signs: LMP  (LMP Unknown)   Physical Exam  Imaging: MR Brain W Wo Contrast  Result Date: 08/19/2023 CLINICAL DATA:  Provided history: Melanoma of skin. Melanoma, stage IIB/III/IV, monitor. New malignant melanoma of scalp and neck. EXAM: MRI HEAD WITHOUT AND WITH CONTRAST TECHNIQUE: Multiplanar, multiecho pulse sequences of the brain and surrounding structures were obtained without and with intravenous contrast. CONTRAST:  7.48mL GADAVIST GADOBUTROL 1 MMOL/ML IV SOLN COMPARISON:  PET CT 06/11/2023 FINDINGS: Brain: No age advanced or lobar predominant parenchymal atrophy. Multifocal T2 FLAIR hyperintense signal abnormality within the cerebral white matter, nonspecific but compatible with minimal chronic small vessel ischemic disease. Moderate chronic small vessel ischemic changes within the pons. No cortical encephalomalacia is identified. There is no acute infarct. No evidence of an intracranial mass. No chronic intracranial blood products. No extra-axial fluid collection. No midline shift. No pathologic intracranial enhancement identified. Vascular: Maintained flow voids within the proximal large arterial vessels. Skull and upper cervical spine: No focal worrisome marrow lesion. Incompletely assessed cervical spondylosis. Sinuses/Orbits: No mass or acute  finding within the imaged orbits. No significant paranasal sinus disease. Other: Small-volume fluid within the bilateral mastoid air cells. IMPRESSION: 1. No evidence of intracranial metastatic disease. 2. Chronic small vessel ischemic changes which are moderate in the pons and minimal in the cerebral white matter. 3. Small-volume fluid within the bilateral mastoid air cells. Electronically Signed   By: Jackey Loge D.O.   On: 08/19/2023 09:52    Labs:  CBC: Recent Labs    09/06/22 0837 03/13/23 0832  WBC 6.4 7.0  HGB 15.0 14.4  HCT 45.0 44.5  PLT 181 159    COAGS: No results for input(s): "INR", "APTT" in the last 8760 hours.  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 She was complaining of neck swelling and found to have a right neck mass s/p PET and biopsy which was inconclusive and yielded necrotic tissue, s/p surgical excision, pathology consistent with malignant melanoma. Patient has been seen by oncology and is here today for a port a catheter placement for treatment in addition to radiation.  The patient has been NPO, labs and vitals have been reviewed.  Risks and benefits of image guided port-a-catheter placement was discussed with the patient including, but not limited to bleeding, infection, pneumothorax, or fibrin sheath development and need for additional procedures.  All of the patient's questions were answered, patient is agreeable to proceed. Consent signed and in chart.   Thank you for this interesting consult.  I greatly enjoyed meeting HETHER DANKO and look forward  to participating in their care.  A copy of this report was sent to the requesting provider on this date.  Electronically Signed: Berneta Levins, PA-Hoover 08/20/2023, 4:41 PM   I spent a total of  15 Minutes in face to face in clinical consultation, greater than 50% of which was counseling/coordinating care for port a catheter placement.

## 2023-08-20 NOTE — Progress Notes (Signed)
Patient for IR Port Placement on Thurs 08/21/2023, I called and spoke with the patient on the phone and gave pre-procedure instructions. Pt was made aware to be here at 10:30a, NPO after MN prior to procedure as well as driver post procedure/recovery/discharge. Pt stated understanding.  Called 08/20/2023

## 2023-08-21 ENCOUNTER — Encounter: Payer: Self-pay | Admitting: Oncology

## 2023-08-21 ENCOUNTER — Encounter: Payer: Self-pay | Admitting: Radiology

## 2023-08-21 ENCOUNTER — Ambulatory Visit
Admission: RE | Admit: 2023-08-21 | Discharge: 2023-08-21 | Disposition: A | Discharge status: Home / Self Care | Payer: PPO | Source: Ambulatory Visit | Attending: Oncology | Admitting: Oncology

## 2023-08-21 DIAGNOSIS — Z87891 Personal history of nicotine dependence: Secondary | ICD-10-CM | POA: Diagnosis not present

## 2023-08-21 DIAGNOSIS — E785 Hyperlipidemia, unspecified: Secondary | ICD-10-CM | POA: Diagnosis not present

## 2023-08-21 DIAGNOSIS — C439 Malignant melanoma of skin, unspecified: Secondary | ICD-10-CM | POA: Diagnosis not present

## 2023-08-21 DIAGNOSIS — Z01812 Encounter for preprocedural laboratory examination: Secondary | ICD-10-CM | POA: Diagnosis not present

## 2023-08-21 DIAGNOSIS — I10 Essential (primary) hypertension: Secondary | ICD-10-CM | POA: Diagnosis not present

## 2023-08-21 DIAGNOSIS — Z0189 Encounter for other specified special examinations: Secondary | ICD-10-CM | POA: Diagnosis not present

## 2023-08-21 DIAGNOSIS — Z85818 Personal history of malignant neoplasm of other sites of lip, oral cavity, and pharynx: Secondary | ICD-10-CM | POA: Insufficient documentation

## 2023-08-21 HISTORY — PX: IR IMAGING GUIDED PORT INSERTION: IMG5740

## 2023-08-21 MED ORDER — HEPARIN SOD (PORK) LOCK FLUSH 100 UNIT/ML IV SOLN
INTRAVENOUS | Status: AC
  Filled 2023-08-21: qty 5

## 2023-08-21 MED ORDER — MIDAZOLAM HCL 5 MG/5ML IJ SOLN
INTRAMUSCULAR | Status: AC | PRN
  Administered 2023-08-21: 1 mg via INTRAVENOUS

## 2023-08-21 MED ORDER — MIDAZOLAM HCL 2 MG/2ML IJ SOLN
INTRAMUSCULAR | Status: AC
Start: 2023-08-21 — End: ?
  Filled 2023-08-21: qty 2

## 2023-08-21 MED ORDER — FENTANYL CITRATE (PF) 100 MCG/2ML IJ SOLN
INTRAMUSCULAR | Status: AC
  Filled 2023-08-21: qty 2

## 2023-08-21 MED ORDER — HEPARIN SOD (PORK) LOCK FLUSH 100 UNIT/ML IV SOLN
500.0000 [IU] | Freq: Once | INTRAVENOUS | Status: AC
  Administered 2023-08-21: 500 [IU] via INTRAVENOUS

## 2023-08-21 MED ORDER — LIDOCAINE-EPINEPHRINE 1 %-1:100000 IJ SOLN
20.0000 mL | Freq: Once | INTRAMUSCULAR | Status: AC
  Administered 2023-08-21: 20 mL via INTRADERMAL

## 2023-08-21 MED ORDER — LIDOCAINE-EPINEPHRINE 1 %-1:100000 IJ SOLN
INTRAMUSCULAR | Status: AC
  Filled 2023-08-21: qty 1

## 2023-08-21 MED ORDER — FENTANYL CITRATE (PF) 100 MCG/2ML IJ SOLN
INTRAMUSCULAR | Status: AC | PRN
  Administered 2023-08-21: 50 ug via INTRAVENOUS
  Administered 2023-08-21: 25 ug via INTRAVENOUS

## 2023-08-21 MED ORDER — SODIUM CHLORIDE 0.9 % IV SOLN
INTRAVENOUS | Status: DC
  Administered 2023-08-21: 250 mL via INTRAVENOUS

## 2023-08-21 NOTE — Procedures (Signed)
Interventional Radiology Procedure Note  Procedure: Placement of a right IJ approach single lumen PowerPort.  Tip is positioned at the superior cavoatrial junction and catheter is ready for immediate use.  Complications: None Recommendations:  - Ok to shower tomorrow - Do not submerge for 7 days - Routine line care   Signed,  Leandra Vanderweele S. Catera Hankins, DO   

## 2023-08-21 NOTE — Discharge Instructions (Signed)
Implanted Port Home Guide  An implanted port is a type of central line that is placed under the skin. Central lines are used to provide IV access when treatment or nutrition needs to be given through a person's veins. Implanted ports are used for long-term IV access. An implanted port may be placed because: You need IV medicine that would be irritating to the small veins in your hands or arms. You need long-term IV medicines, such as antibiotics. You need IV nutrition for a long period. You need frequent blood draws for lab tests. You need dialysis.   Implanted ports are usually placed in the chest area, but they can also be placed in the upper arm, the abdomen, or the leg. An implanted port has two main parts: Reservoir. The reservoir is round and will appear as a small, raised area under your skin. The reservoir is the part where a needle is inserted to give medicines or draw blood. Catheter. The catheter is a thin, flexible tube that extends from the reservoir. The catheter is placed into a large vein. Medicine that is inserted into the reservoir goes into the catheter and then into the vein.   How will I care for my incision  You may shower tomorrow Please remove dressing in 24hrs not other skin care is needed  How is my port accessed? Special steps must be taken to access the port: Before the port is accessed, a numbing cream can be placed on the skin. This helps numb the skin over the port site. Your health care provider uses a sterile technique to access the port. Your health care provider must put on a mask and sterile gloves. The skin over your port is cleaned carefully with an antiseptic and allowed to dry. The port is gently pinched between sterile gloves, and a needle is inserted into the port. Only "non-coring" port needles should be used to access the port. Once the port is accessed, a blood return should be checked. This helps ensure that the port is in the vein and is not  clogged. If your port needs to remain accessed for a constant infusion, a clear (transparent) bandage will be placed over the needle site. The bandage and needle will need to be changed every week, or as directed by your health care provider.   What is flushing? Flushing helps keep the port from getting clogged. Follow your health care provider's instructions on how and when to flush the port. Ports are usually flushed with saline solution or a medicine called heparin. The need for flushing will depend on how the port is used. If the port is used for intermittent medicines or blood draws, the port will need to be flushed: After medicines have been given. After blood has been drawn. As part of routine maintenance. If a constant infusion is running, the port may not need to be flushed.   How long will my port stay implanted? The port can stay in for as long as your health care provider thinks it is needed. When it is time for the port to come out, surgery will be done to remove it. The procedure is similar to the one performed when the port was put in. When should I seek immediate medical care? When you have an implanted port, you should seek immediate medical care if: You notice a bad smell coming from the incision site. You have swelling, redness, or drainage at the incision site. You have more swelling or pain at the   port site or the surrounding area. You have a fever that is not controlled with medicine.   This information is not intended to replace advice given to you by your health care provider. Make sure you discuss any questions you have with your health care provider. Document Released: 09/23/2005 Document Revised: 02/29/2016 Document Reviewed: 05/31/2013 Elsevier Interactive Patient Education  2017 Elsevier Inc.   

## 2023-08-22 ENCOUNTER — Ambulatory Visit (INDEPENDENT_AMBULATORY_CARE_PROVIDER_SITE_OTHER): Payer: PPO

## 2023-08-22 VITALS — BP 124/64 | Ht 61.0 in | Wt 166.0 lb

## 2023-08-22 DIAGNOSIS — Z0000 Encounter for general adult medical examination without abnormal findings: Secondary | ICD-10-CM | POA: Diagnosis not present

## 2023-08-22 DIAGNOSIS — Z23 Encounter for immunization: Secondary | ICD-10-CM

## 2023-08-22 NOTE — Progress Notes (Signed)
Subjective:   [name] is a 82 y.o. female who presents for Medicare Annual (Subsequent) preventive examination.  Visit Complete: In person       Objective:    Today's Vitals   08/22/23 0814 08/22/23 0818  BP: 124/64   Weight: 166 lb (75.3 kg)   Height: 5\' 1"  (1.549 m)   PainSc:  0-No pain   Body mass index is 31.37 kg/m.     08/22/2023    8:24 AM 08/11/2023    2:17 PM 12/15/2020   10:16 AM 06/16/2019    5:44 AM 06/11/2019   10:18 AM 09/16/2017    6:16 PM 09/09/2017    1:51 PM  Advanced Directives  Does Patient Have a Medical Advance Directive? Yes Yes Yes Yes Yes Yes Yes  Type of Estate agent of [place];Living will Healthcare Power of [place];Living will Healthcare Power of [place];Living will Living will  Healthcare Power of [place];Living will Living will  Does patient want to make changes to medical advance directive? No - Patient declined   No - Patient declined No - Patient declined No - Patient declined No - Patient declined  Copy of Healthcare Power of Attorney in Chart? No - copy requested No - copy requested    No - copy requested     Current Medications (verified) Outpatient Encounter Medications as of 08/22/2023  Medication Sig   aspirin EC 81 MG tablet Take 81 mg by mouth daily.   atorvastatin (LIPITOR) 10 MG tablet Take 1 tablet (10 mg total) by mouth daily.   Blood Glucose Monitoring Suppl DEVI Use to check blood sugar twice a day.  DX R73.03. May substitute to any manufacturer covered by patient's insurance.   Calcium Carb-Cholecalciferol 600-800 MG-UNIT TABS Take 1 tablet by mouth 2 (two) times daily.    cetirizine (ZYRTEC) 10 MG tablet Take 10 mg by mouth daily.   clotrimazole-betamethasone (LOTRISONE) cream APPLY 1-2 TIMES A DAY AS NEEDED FOR SPOT TREATMENT FOR YEAST SKIN RASH   esomeprazole (NEXIUM) 20 MG capsule Take 1 capsule (20 mg total) by mouth 2 (two) times daily before a meal.   fluticasone (FLONASE) 50 MCG/ACT  nasal spray Place 2 sprays into the nose at bedtime.    Glucosamine-Chondroitin (COSAMIN DS PO) Take 1 tablet by mouth 2 (two) times daily.   Lancets Misc. MISC Use to check blood sugar twice a day.  DX R73.03. May substitute to any manufacturer covered by patient's insurance.   lidocaine-prilocaine (EMLA) cream Apply to affected area once   Multiple Vitamins-Minerals (MULTIVITAMIN WITH MINERALS) tablet Take 1 tablet by mouth every evening. Women's One-A-Day   nystatin (MYCOSTATIN/NYSTOP) powder APPLY TOPICALLY 3 TIMES A DAY   Omega-3 Fatty Acids (FISH OIL) 1000 MG CAPS Take 1,000 mg by mouth daily.   ONETOUCH VERIO test strip USE TO CHECK BLOOD SUGAR TWICE A DAY. DX R73.03. MAY SUBSTITUTE TO ANY MANUFACTURER COVERED BY PATIENT'S INSURANCE.   TURMERIC PO Take 538 mg by mouth daily.   Lancet Device MISC Use to check blood sugar twice a day.  DX R73.03. May substitute to any manufacturer covered by patient's insurance.   ondansetron (ZOFRAN) 8 MG tablet Take 1 tablet (8 mg total) by mouth every 8 (eight) hours as needed for nausea or vomiting. (Patient not taking: Reported on 08/22/2023)   prochlorperazine (COMPAZINE) 10 MG tablet Take 1 tablet (10 mg total) by mouth every 6 (six) hours as needed for nausea or vomiting. (Patient not taking: Reported on 08/22/2023)  No facility-administered encounter medications on file as of 08/22/2023.    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   IR IMAGING GUIDED PORT INSERTION  08/21/2023   KNEE ARTHROSCOPY W/ MENISCAL REPAIR Left    PARTIAL KNEE  ARTHROPLASTY Right 09/16/2017   PARTIAL KNEE ARTHROPLASTY Right 09/16/2017   Procedure: RIGHT UNICOMPARTMENTAL KNEE;  Surgeon: Kathryne Hitch, MD;  Location: [facility] OR;  Service: Orthopedics;  Laterality: Right;   SHOULDER ARTHROSCOPY Left 11/2018   TOTAL KNEE REVISION Right 06/15/2019   Procedure: RIGHT UNICOMPARTMENTAL ARTHROPLASTY TO TOTAL KNEE ARTHROPLASTY;  Surgeon: Kathryne Hitch, MD;  Location: [facility] OR;  Service: Orthopedics;  Laterality: Right;   TUBAL LIGATION     Family History  Problem Relation Age of Onset   Breast cancer Neg Hx    Social History   Socioeconomic History   Marital status: Married    Spouse name: Joyia Salts   Number of children: 2   Years of education: Not on file   Highest education level: 12th grade  Occupational History   Occupation: Retired  Tobacco Use   Smoking status: Former    Current packs/day: 0.00    Types: Cigarettes    Quit date: 12/28/1986    Years since quitting: 36.6   Smokeless tobacco: Never  Vaping Use   Vaping status: Never Used  Substance and Sexual Activity   Alcohol use: No   Drug use: No   Sexual activity: Not Currently  Other Topics Concern   Not on file  Social History Narrative   Not on file   Social Determinants of Health   Financial Resource Strain: Low Risk  (08/22/2023)   Overall Financial Resource Strain (CARDIA)    Difficulty of Paying Living Expenses: Not hard at all  Food Insecurity: No Food Insecurity (08/22/2023)   Hunger Vital Sign    Worried About Running Out of Food in the Last Year: Never true    Ran Out of Food in the Last Year: Never true  Transportation Needs: No Transportation Needs (08/22/2023)   PRAPARE - Administrator, Civil Service (Medical): No    Lack of Transportation (Non-Medical): No  Physical Activity: Insufficiently Active (08/22/2023)   Exercise Vital Sign    Days of Exercise per Week: 4 days    Minutes of Exercise per Session: 30 min  Stress: No Stress  Concern Present (08/22/2023)   [name]    Feeling of Stress : Not at all  Social Connections: Moderately Integrated (08/22/2023)   Social Connection and Isolation Panel [NHANES]    Frequency of Communication with Friends and Family: More than three times a week    Frequency of Social Gatherings with Friends and Family: Once a week    Attends Religious Services: More than 4 times per year    Active Member of [employer] or Organizations: No    Attends Banker Meetings: Never    Marital Status: Married  Tobacco Counseling Counseling given: Not Answered   Clinical Intake:  Pre-visit preparation completed: Yes  Pain : No/denies pain Pain Score: 0-No pain     BMI - recorded: 31.37 Nutritional Status: BMI > 30  Obese Nutritional Risks: None Diabetes: No  How often do you need to have someone help you when you read instructions, pamphlets, or other written materials from your doctor or pharmacy?: 1 - Never  Interpreter Needed?: No  Information entered by :: Kennedy Bucker, LPN   Activities of Daily Living    08/22/2023    8:26 AM 08/21/2023   10:37 AM  In your present state of health, do you have any difficulty performing the following activities:  Hearing? 0 0  Vision? 0 0  Difficulty concentrating or making decisions? 0 0  Walking or climbing stairs? 1   Comment knee pain- has to have railing   Dressing or bathing? 0   Doing errands, shopping? 0   Preparing Food and eating ? N   Using the Toilet? N   In the past six months, have you accidently leaked urine? N   Do you have problems with loss of bowel control? N   Managing your Medications? N   Managing your Finances? N   Housekeeping or managing your Housekeeping? N     Patient Care Team: Lorre Munroe, NP as PCP - General (Internal Medicine) Creig Hines, MD as Consulting Physician (Oncology) Dingeldein, Viviann Spare, MD  (Ophthalmology)  Indicate any recent Medical Services you may have received from other than [facility] providers in the past year (date may be approximate).     Assessment:   This is a routine wellness examination for Iliany.  Hearing/Vision screen Hearing Screening - Comments:: No aids Vision Screening - Comments:: Wears glasses- Dr.Dingeldein   Goals Addressed             This Visit's Progress    DIET - EAT MORE FRUITS AND VEGETABLES         Depression Screen    08/22/2023    8:22 AM 08/11/2023    3:39 PM 05/20/2023    9:54 AM 03/13/2023    8:15 AM 09/06/2022    8:21 AM 09/03/2021    8:57 AM 08/22/2020   11:43 AM  PHQ 2/9 Scores  PHQ - 2 Score 0 0 0 0 0 0 0  PHQ- 9 Score 0     0     Fall Risk    08/22/2023    8:25 AM 08/18/2023   10:04 AM 05/20/2023    9:54 AM 03/13/2023    8:15 AM 09/06/2022    8:22 AM  Fall Risk   Falls in the past year? 1 0 0 0 0  Number falls in past yr: 0 0     Injury with Fall? 0  0 0 0  Risk for fall due to : History of fall(s)  No Fall Risks No Fall Risks   Follow up Falls prevention discussed;Falls evaluation completed        MEDICARE RISK AT HOME: Medicare Risk at Home Any stairs in or around the home?: Yes If so, are there any without handrails?: Yes Home free of loose throw rugs in walkways, pet beds, electrical cords, etc?: Yes Adequate lighting in your home to reduce risk of falls?: Yes Life alert?: No Use of a cane, walker or w/c?: No Grab bars in the bathroom?: No Shower chair or bench in shower?: No Elevated toilet seat or a handicapped  toilet?: Yes  TIMED UP AND GO:  Was the test performed?  Yes  Length of time to ambulate 10 feet: 5 sec Gait slow and steady without use of assistive device    Cognitive Function:        08/22/2023    8:28 AM 08/13/2022    2:27 PM  6CIT Screen  What Year? 0 points 0 points  What month? 0 points 0 points  What time? 0 points 0 points  Count back from 20 0 points 2 points  Months in  reverse 0 points 0 points  Repeat phrase 0 points 10 points  Total Score 0 points 12 points    Immunizations Immunization History  Administered Date(s) Administered   Fluad Quad(high Dose 65+) 09/03/2021, 09/06/2022   Influenza,inj,Quad PF,6+ Mos 06/28/2013, 07/13/2014, 07/03/2015, 06/24/2016, 07/14/2017, 07/16/2018, 07/29/2019, 08/22/2020   Moderna Covid-19 Vaccine Bivalent Booster 38yrs & up 07/16/2021   Moderna SARS-COV2 Booster Vaccination 07/08/2022   Moderna Sars-Covid-2 Vaccination 08/07/2020   PFIZER SARS-COV-2 Pediatric Vaccination 5-67yrs 11/09/2019, 12/10/2019   PNEUMOCOCCAL CONJUGATE-20 09/06/2022   Pneumococcal Conjugate-13 07/13/2014   Pneumococcal Polysaccharide-23 07/09/2007   Td 08/03/2012   Zoster Recombinant(Shingrix) 08/11/2018, 11/12/2018   Zoster, Live 07/21/2008    TDAP status: Due, Education has been provided regarding the importance of this vaccine. Advised may receive this vaccine at local pharmacy or Health Dept. Aware to provide a copy of the vaccination record if obtained from local pharmacy or Health Dept. Verbalized acceptance and understanding.- had Td at [facility]  Flu Vaccine status: Completed at today's visit  Pneumococcal vaccine status: Up to date  Covid-19 vaccine status: Completed vaccines  Qualifies for Shingles Vaccine? Yes   Zostavax completed Yes   Shingrix Completed?: Yes  Screening Tests Health Maintenance  Topic Date Due   DTaP/Tdap/Td (2 - Tdap) 08/03/2022   INFLUENZA VACCINE  05/08/2023   Medicare Annual Wellness (AWV)  08/21/2024   Pneumonia Vaccine 25+ Years old  Completed   DEXA SCAN  Completed   Zoster Vaccines- Shingrix  Completed   HPV VACCINES  Aged Out   COVID-19 Vaccine  Discontinued    Health Maintenance  Health Maintenance Due  Topic Date Due   DTaP/Tdap/Td (2 - Tdap) 08/03/2022   INFLUENZA VACCINE  05/08/2023    Colorectal cancer screening: No longer required.   Mammogram status: No longer required  due to age.  Bone Density status: Completed 01/03/21. Results reflect: Bone density results: OSTEOPENIA. Repeat every 3 years.  Lung Cancer Screening: (Low Dose CT Chest recommended if Age 25-80 years, 20 pack-year currently smoking OR have quit w/in 15years.) does not qualify.    Additional Screening:  Hepatitis C Screening: does not qualify; Completed no  Vision Screening: Recommended annual ophthalmology exams for early detection of glaucoma and other disorders of the eye. Is the patient up to date with their annual eye exam?  Yes  Who is the provider or what is the name of the office in which the patient attends annual eye exams? Dr.Dingeldein If pt is not established with a provider, would they like to be referred to a provider to establish care? No .   Dental Screening: Recommended annual dental exams for proper oral hygiene   Community Resource Referral / Chronic Care Management: CRR required this visit?  Yes   CCM required this visit?  No     Plan:     I have personally reviewed and noted the following in the patient's chart:   Medical and social history  Use of alcohol, tobacco or illicit drugs  Current medications and supplements including opioid prescriptions. Patient is not currently taking opioid prescriptions. Functional ability and status Nutritional status Physical activity Advanced directives List of other physicians Hospitalizations, surgeries, and ER visits in previous 12 months Vitals Screenings to include cognitive, depression, and falls Referrals and appointments  In addition, I have reviewed and discussed with patient certain preventive protocols, quality metrics, and best practice recommendations. A written personalized care plan for preventive services as well as general preventive health recommendations were provided to patient.     Hal Hope, LPN   16/07/9603   After Visit Summary: (In Person-Declined) Patient declined AVS at this  time.  Nurse Notes: none

## 2023-08-22 NOTE — Telephone Encounter (Signed)
Pt says that she will start the radiation 12/2. She said that both you trying to get the MD at [facility] and the MD at [facility] trying to get you.[facility] doctor says fine to do [place] before 12/2. I told her that I will pass it long.

## 2023-08-22 NOTE — Patient Instructions (Addendum)
[name] , Thank you for taking time to come for your Medicare Wellness Visit. I appreciate your ongoing commitment to your health goals. Please review the following plan we discussed and let me know if I can assist you in the future.   Referrals/Orders/Follow-Ups/Clinician Recommendations: flu shot given  This is a list of the screening recommended for you and due dates:  Health Maintenance  Topic Date Due   DTaP/Tdap/Td vaccine (2 - Tdap) 08/03/2022   Flu Shot  05/08/2023   Medicare Annual Wellness Visit  08/21/2024   Pneumonia Vaccine  Completed   DEXA scan (bone density measurement)  Completed   Zoster (Shingles) Vaccine  Completed   HPV Vaccine  Aged Out   COVID-19 Vaccine  Discontinued    Advanced directives: (Copy Requested) Please bring a copy of your health care power of attorney and living will to the office to be added to your chart at your convenience.  Next Medicare Annual Wellness Visit scheduled for next year: Yes   08/27/24 @ 8:10 am in person

## 2023-08-24 ENCOUNTER — Other Ambulatory Visit: Payer: Self-pay

## 2023-08-25 DIAGNOSIS — H16223 Keratoconjunctivitis sicca, not specified as Sjogren's, bilateral: Secondary | ICD-10-CM | POA: Diagnosis not present

## 2023-08-25 DIAGNOSIS — C434 Malignant melanoma of scalp and neck: Secondary | ICD-10-CM | POA: Diagnosis not present

## 2023-08-25 DIAGNOSIS — H2513 Age-related nuclear cataract, bilateral: Secondary | ICD-10-CM | POA: Diagnosis not present

## 2023-08-26 ENCOUNTER — Other Ambulatory Visit: Payer: Self-pay | Admitting: Oncology

## 2023-08-26 ENCOUNTER — Telehealth: Payer: Self-pay | Admitting: *Deleted

## 2023-08-26 DIAGNOSIS — C434 Malignant melanoma of scalp and neck: Secondary | ICD-10-CM | POA: Diagnosis not present

## 2023-08-26 NOTE — Telephone Encounter (Signed)
I called to check on the appts for the pt. For education and then next week on tues. To get labs and see [name] and then get [place]. Before she starts her radiation at [facility]

## 2023-08-27 NOTE — Progress Notes (Signed)
Pharmacist Chemotherapy Monitoring - Initial Assessment    Anticipated start date: 09/02/23   The following has been reviewed per standard work regarding the patient's treatment regimen: The patient's diagnosis, treatment plan and drug doses, and organ/hematologic function Lab orders and baseline tests specific to treatment regimen  The treatment plan start date, drug sequencing, and pre-medications Prior authorization status  Patient's documented medication list, including drug-drug interaction screen and prescriptions for anti-emetics and supportive care specific to the treatment regimen The drug concentrations, fluid compatibility, administration routes, and timing of the medications to be used The patient's access for treatment and lifetime cumulative dose history, if applicable  The patient's medication allergies and previous infusion related reactions, if applicable   Changes made to treatment plan:  N/A  Follow up needed:  N/A   Sharen Hones, PharmD, BCPS Clinical Pharmacist   08/27/2023  3:11 PM

## 2023-08-29 ENCOUNTER — Inpatient Hospital Stay: Payer: PPO

## 2023-09-01 ENCOUNTER — Ambulatory Visit: Payer: PPO | Admitting: Physician Assistant

## 2023-09-01 DIAGNOSIS — M7052 Other bursitis of knee, left knee: Secondary | ICD-10-CM | POA: Diagnosis not present

## 2023-09-01 NOTE — Progress Notes (Signed)
HPI: [name] comes in today due to left knee pain.  She has had no known injury.  Complaining of pain medial aspect knee asking about an injection.  Notes knee gives way and painful popping.  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  She has had no fevers or chills.  We last saw her in the spring for pes anserinus bursitis left knee.  Review of systems: See HPI otherwise negative  Physical exam: General Well-developed well-nourished female no acute distress ambulates without any assistive device and a nonantalgic gait.  Left knee good range of motion no abnormal warmth erythema or effusion.  Nontender over the medial lateral joint line.  No instability valgus varus stressing.  Tenderness directly over the left pes anserinus region.  Impression: Left knee pes anserinus bursitis  Plan: Recommend Voltaren gel 4 g 4 times daily over the pes anserinus area.  Also recommend hamstring stretching.  Follow-up as needed.  Questions were encouraged and

## 2023-09-02 ENCOUNTER — Inpatient Hospital Stay: Payer: PPO

## 2023-09-02 ENCOUNTER — Inpatient Hospital Stay (HOSPITAL_BASED_OUTPATIENT_CLINIC_OR_DEPARTMENT_OTHER): Payer: PPO | Admitting: Oncology

## 2023-09-02 ENCOUNTER — Encounter: Payer: Self-pay | Admitting: Oncology

## 2023-09-02 VITALS — BP 156/58 | HR 69 | Temp 96.0°F | Resp 17 | Wt 164.8 lb

## 2023-09-02 DIAGNOSIS — C439 Malignant melanoma of skin, unspecified: Secondary | ICD-10-CM

## 2023-09-02 DIAGNOSIS — C770 Secondary and unspecified malignant neoplasm of lymph nodes of head, face and neck: Secondary | ICD-10-CM | POA: Diagnosis not present

## 2023-09-02 DIAGNOSIS — Z5112 Encounter for antineoplastic immunotherapy: Secondary | ICD-10-CM | POA: Diagnosis not present

## 2023-09-02 LAB — CBC WITH DIFFERENTIAL (CANCER CENTER ONLY)
Abs Immature Granulocytes: 0.02 10*3/uL (ref 0.00–0.07)
Basophils Absolute: 0 10*3/uL (ref 0.0–0.1)
Basophils Relative: 0 %
Eosinophils Absolute: 0.1 10*3/uL (ref 0.0–0.5)
Eosinophils Relative: 1 %
HCT: 40.4 % (ref 36.0–46.0)
Hemoglobin: 13.4 g/dL (ref 12.0–15.0)
Immature Granulocytes: 0 %
Lymphocytes Relative: 33 %
Lymphs Abs: 2.4 10*3/uL (ref 0.7–4.0)
MCH: 30.8 pg (ref 26.0–34.0)
MCHC: 33.2 g/dL (ref 30.0–36.0)
MCV: 92.9 fL (ref 80.0–100.0)
Monocytes Absolute: 0.6 10*3/uL (ref 0.1–1.0)
Monocytes Relative: 8 %
Neutro Abs: 4.1 10*3/uL (ref 1.7–7.7)
Neutrophils Relative %: 58 %
Platelet Count: 153 10*3/uL (ref 150–400)
RBC: 4.35 MIL/uL (ref 3.87–5.11)
RDW: 12.6 % (ref 11.5–15.5)
WBC Count: 7.3 10*3/uL (ref 4.0–10.5)
nRBC: 0 % (ref 0.0–0.2)

## 2023-09-02 LAB — CMP (CANCER CENTER ONLY)
ALT: 20 U/L (ref 0–44)
AST: 21 U/L (ref 15–41)
Albumin: 3.9 g/dL (ref 3.5–5.0)
Alkaline Phosphatase: 64 U/L (ref 38–126)
Anion gap: 11 (ref 5–15)
BUN: 18 mg/dL (ref 8–23)
CO2: 25 mmol/L (ref 22–32)
Calcium: 8.8 mg/dL — ABNORMAL LOW (ref 8.9–10.3)
Chloride: 103 mmol/L (ref 98–111)
Creatinine: 0.56 mg/dL (ref 0.44–1.00)
GFR, Estimated: >60 mL/min (ref >60)
Glucose, Bld: 98 mg/dL (ref 70–99)
Potassium: 4 mmol/L (ref 3.5–5.1)
Sodium: 139 mmol/L (ref 135–145)
Total Bilirubin: 0.8 mg/dL (ref <1.2)
Total Protein: 7 g/dL (ref 6.5–8.1)

## 2023-09-02 LAB — TSH: TSH: 0.955 u[IU]/mL (ref 0.350–4.500)

## 2023-09-02 MED ORDER — SODIUM CHLORIDE 0.9 % IV SOLN
INTRAVENOUS | Status: DC
  Filled 2023-09-02: qty 250

## 2023-09-02 MED ORDER — SODIUM CHLORIDE 0.9 % IV SOLN
200.0000 mg | Freq: Once | INTRAVENOUS | Status: AC
  Administered 2023-09-02: 200 mg via INTRAVENOUS
  Filled 2023-09-02: qty 200

## 2023-09-02 MED ORDER — HEPARIN SOD (PORK) LOCK FLUSH 100 UNIT/ML IV SOLN
500.0000 [IU] | Freq: Once | INTRAVENOUS | Status: AC | PRN
Start: 2023-09-02 — End: 2023-09-02
  Administered 2023-09-02: 500 [IU]
  Filled 2023-09-02: qty 5

## 2023-09-02 NOTE — Patient Instructions (Signed)
[facility] - A DEPT OF [facility]  Discharge Instructions: Thank you for choosing [facility] to provide your oncology and hematology care.  If you have a lab appointment with the Cancer Center, please go directly to the Cancer Center and check in at the registration area.  Wear comfortable clothing and clothing appropriate for easy access to any Portacath or PICC line.   We strive to give you quality time with your provider. You may need to reschedule your appointment if you arrive late (15 or more minutes).  Arriving late affects you and other patients whose appointments are after yours.  Also, if you miss three or more appointments without notifying the office, you may be dismissed from the clinic at the provider's discretion.      For prescription refill requests, have your pharmacy contact our office and allow 72 hours for refills to be completed.    Today you received the following chemotherapy and/or immunotherapy agents KEYTRUDA      To help prevent nausea and vomiting after your treatment, we encourage you to take your nausea medication as directed.  BELOW ARE SYMPTOMS THAT SHOULD BE REPORTED IMMEDIATELY: *FEVER GREATER THAN 100.4 F (38 C) OR HIGHER *CHILLS OR SWEATING *NAUSEA AND VOMITING THAT IS NOT CONTROLLED WITH YOUR NAUSEA MEDICATION *UNUSUAL SHORTNESS OF BREATH *UNUSUAL BRUISING OR BLEEDING *URINARY PROBLEMS (pain or burning when urinating, or frequent urination) *BOWEL PROBLEMS (unusual diarrhea, constipation, pain near the anus) TENDERNESS IN MOUTH AND THROAT WITH OR WITHOUT PRESENCE OF ULCERS (sore throat, sores in mouth, or a toothache) UNUSUAL RASH, SWELLING OR PAIN  UNUSUAL VAGINAL DISCHARGE OR ITCHING   Items with * indicate a potential emergency and should be followed up as soon as possible or go to the Emergency Department if any problems should occur.  Please show the CHEMOTHERAPY ALERT CARD or IMMUNOTHERAPY  ALERT CARD at check-in to the Emergency Department and triage nurse.  Should you have questions after your visit or need to cancel or reschedule your appointment, please contact [facility] - A DEPT OF [facility]  [phone] and follow the prompts.  Office hours are 8:00 a.m. to 4:30 p.m. Monday - Friday. Please note that voicemails left after 4:00 p.m. may not be returned until the following business day.  We are closed weekends and major holidays. You have access to a nurse at all times for urgent questions. Please call the main number to the clinic [phone] and follow the prompts.  For any non-urgent questions, you may also contact your provider using MyChart. We now offer e-Visits for anyone 97 and older to request care online for non-urgent symptoms. For details visit [email or web address].   Also download the MyChart app! Go to the app store, search "MyChart", open the app, select [place], and log in with your MyChart username and password.  Pembrolizumab Injection What is this medication? PEMBROLIZUMAB (PEM broe LIZ ue mab) treats some types of cancer. It works by helping your immune system slow or stop the spread of cancer cells. It is a monoclonal antibody. This medicine may be used for other purposes; ask your health care provider or pharmacist if you have questions. COMMON BRAND NAME(S): Keytruda What should I tell my care team before I take this medication? They need to know if you have any of these conditions: Allogeneic stem cell transplant (uses someone else's stem cells) Autoimmune diseases, such as Crohn disease, ulcerative colitis, lupus History  of chest radiation Nervous system problems, such as Guillain-Barre syndrome, myasthenia gravis Organ transplant An unusual or allergic reaction to pembrolizumab, other medications, foods, dyes, or preservatives Pregnant or trying to get pregnant Breast-feeding How should I use this  medication? This medication is injected into a vein. It is given by your care team in a hospital or clinic setting. A special MedGuide will be given to you before each treatment. Be sure to read this information carefully each time. Talk to your care team about the use of this medication in children. While it may be prescribed for children as young as 6 months for selected conditions, precautions do apply. Overdosage: If you think you have taken too much of this medicine contact a poison control center or emergency room at once. NOTE: This medicine is only for you. Do not share this medicine with others. What if I miss a dose? Keep appointments for follow-up doses. It is important not to miss your dose. Call your care team if you are unable to keep an appointment. What may interact with this medication? Interactions have not been studied. This list may not describe all possible interactions. Give your health care provider a list of all the medicines, herbs, non-prescription drugs, or dietary supplements you use. Also tell them if you smoke, drink alcohol, or use illegal drugs. Some items may interact with your medicine. What should I watch for while using this medication? Your condition will be monitored carefully while you are receiving this medication. You may need blood work while taking this medication. This medication may cause serious skin reactions. They can happen weeks to months after starting the medication. Contact your care team right away if you notice fevers or flu-like symptoms with a rash. The rash may be red or purple and then turn into blisters or peeling of the skin. You may also notice a red rash with swelling of the face, lips, or lymph nodes in your neck or under your arms. Tell your care team right away if you have any change in your eyesight. Talk to your care team if you may be pregnant. Serious birth defects can occur if you take this medication during pregnancy and for 4  months after the last dose. You will need a negative pregnancy test before starting this medication. Contraception is recommended while taking this medication and for 4 months after the last dose. Your care team can help you find the option that works for you. Do not breastfeed while taking this medication and for 4 months after the last dose. What side effects may I notice from receiving this medication? Side effects that you should report to your care team as soon as possible: Allergic reactions--skin rash, itching, hives, swelling of the face, lips, tongue, or throat Dry cough, shortness of breath or trouble breathing Eye pain, redness, irritation, or discharge with blurry or decreased vision Heart muscle inflammation--unusual weakness or fatigue, shortness of breath, chest pain, fast or irregular heartbeat, dizziness, swelling of the ankles, feet, or hands Hormone gland problems--headache, sensitivity to light, unusual weakness or fatigue, dizziness, fast or irregular heartbeat, increased sensitivity to cold or heat, excessive sweating, constipation, hair loss, increased thirst or amount of urine, tremors or shaking, irritability Infusion reactions--chest pain, shortness of breath or trouble breathing, feeling faint or lightheaded Kidney injury (glomerulonephritis)--decrease in the amount of urine, red or dark brown urine, foamy or bubbly urine, swelling of the ankles, hands, or feet Liver injury--right upper belly pain, loss of appetite, nausea, light-colored  stool, dark yellow or brown urine, yellowing skin or eyes, unusual weakness or fatigue Pain, tingling, or numbness in the hands or feet, muscle weakness, change in vision, confusion or trouble speaking, loss of balance or coordination, trouble walking, seizures Rash, fever, and swollen lymph nodes Redness, blistering, peeling, or loosening of the skin, including inside the mouth Sudden or severe stomach pain, bloody diarrhea, fever, nausea,  vomiting Side effects that usually do not require medical attention (report to your care team if they continue or are bothersome): Bone, joint, or muscle pain Diarrhea Fatigue Loss of appetite Nausea Skin rash This list may not describe all possible side effects. Call your doctor for medical advice about side effects. You may report side effects to FDA at 1-800-FDA-1088. Where should I keep my medication? This medication is given in a hospital or clinic. It will not be stored at home. NOTE: This sheet is a summary. It may not cover all possible information. If you have questions about this medicine, talk to your doctor, pharmacist, or health care provider.  2024 [employer]/Gold Standard (2022-02-05 00:00:00)

## 2023-09-02 NOTE — Progress Notes (Signed)
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  This was followed by a PET CT scan on 06/11/2023 which showed a 2.8 cm right level 2 lymph node with an SUV of 31.  Peripheral nodular hypermetabolism compatible with irregular rim enhancement.  12 mm nodular component along the inferior aspect of the lesion measuring SUV 10.1.  No other evidence of distant metastatic disease.  Core biopsy showed necrosis but was not conclusive for malignancy.  She was then referred to [facility] and underwent definitive excision of the right cervical lymph node on 07/17/2023.  Pathology showed malignant neoplasm with extensive necrosis consistent with metastatic melanoma.  BRAF negative surgical margins negative.  Right level 2A2B3 and 4 lymph nodes and right tonsil as well as right retromolar trigone biopsy all negative for malignancy. She was staged as stage IIIc T0 N2b M0.  Pros and cons of adjuvant immunotherapy discussed with the patient and patient chose to receive care closer to home in [place].  Interval history-she is doing well presently and denies any complaints at this time.   ECOG PS- 1 Pain scale- 0 Opioid associated constipation- no  Review of systems- Review of Systems  Constitutional:  Negative for chills, fever, malaise/fatigue and weight loss.  HENT:  Negative for congestion, ear  discharge and nosebleeds.   Eyes:  Negative for blurred vision.  Respiratory:  Negative for cough, hemoptysis, sputum production, shortness of breath and wheezing.   Cardiovascular:  Negative for chest pain, palpitations, orthopnea and claudication.  Gastrointestinal:  Negative for abdominal pain, blood in stool, constipation, diarrhea, heartburn, melena, nausea and vomiting.  Genitourinary:  Negative for dysuria, flank pain, frequency, hematuria and urgency.  Musculoskeletal:  Negative for back pain, joint pain and myalgias.  Skin:  Negative for rash.  Neurological:  Negative for dizziness, tingling, focal weakness, seizures, weakness and headaches.  Endo/Heme/Allergies:  Does not bruise/bleed easily.  Psychiatric/Behavioral:  Negative for depression and suicidal ideas. The patient does not have insomnia.       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   IR IMAGING GUIDED PORT INSERTION  08/21/2023   KNEE ARTHROSCOPY W/ MENISCAL REPAIR Left    PARTIAL KNEE ARTHROPLASTY Right 09/16/2017   PARTIAL KNEE ARTHROPLASTY Right  09/16/2017   Procedure: RIGHT UNICOMPARTMENTAL KNEE;  Surgeon: Kathryne Hitch, MD;  Location: [facility] OR;  Service: Orthopedics;  Laterality: Right;   SHOULDER ARTHROSCOPY Left 11/2018   TOTAL KNEE REVISION Right 06/15/2019   Procedure: RIGHT UNICOMPARTMENTAL ARTHROPLASTY TO TOTAL KNEE ARTHROPLASTY;  Surgeon: Kathryne Hitch, MD;   Location: [facility] OR;  Service: Orthopedics;  Laterality: Right;   TUBAL LIGATION      Social History   Socioeconomic History   Marital status: Married    Spouse name: Jerney Emmi   Number of children: 2   Years of education: Not on file   Highest education level: 12th grade  Occupational History   Occupation: Retired  Tobacco Use   Smoking status: Former    Current packs/day: 0.00    Types: Cigarettes    Quit date: 12/28/1986    Years since quitting: 36.7   Smokeless tobacco: Never  Vaping Use   Vaping status: Never Used  Substance and Sexual Activity   Alcohol use: No   Drug use: No   Sexual activity: Not Currently  Other Topics Concern   Not on file  Social History Narrative   Not on file   Social Determinants of Health   Financial Resource Strain: Low Risk  (08/22/2023)   Overall Financial Resource Strain (CARDIA)    Difficulty of Paying Living Expenses: Not hard at all  Food Insecurity: No Food Insecurity (08/22/2023)   Hunger Vital Sign    Worried About Running Out of Food in the Last Year: Never true    Ran Out of Food in the Last Year: Never true  Transportation Needs: No Transportation Needs (08/22/2023)   PRAPARE - Administrator, Civil Service (Medical): No    Lack of Transportation (Non-Medical): No  Physical Activity: Insufficiently Active (08/22/2023)   Exercise Vital Sign    Days of Exercise per Week: 4 days    Minutes of Exercise per Session: 30 min  Stress: No Stress Concern Present (08/22/2023)   [name]    Feeling of Stress : Not at all  Social Connections: Moderately Integrated (08/22/2023)   Social Connection and Isolation Panel [NHANES]    Frequency of Communication with Friends and Family: More than three times a week    Frequency of Social Gatherings with Friends and Family: Once a week    Attends Religious Services: More than 4 times per year    Active Member  of [employer] or Organizations: No    Attends Banker Meetings: Never    Marital Status: Married  Catering manager Violence: Not At Risk (08/22/2023)   Humiliation, Afraid, Rape, and Kick questionnaire    Fear of Current or Ex-Partner: No    Emotionally Abused: No    Physically Abused: No    Sexually Abused: No    Family History  Problem Relation Age of Onset   Breast cancer Neg Hx      Current Outpatient Medications:    aspirin EC 81 MG tablet, Take 81 mg by mouth daily., Disp: , Rfl:    atorvastatin (LIPITOR) 10 MG tablet, Take 1 tablet (10 mg total) by mouth daily., Disp: 90 tablet, Rfl: 1   Blood Glucose Monitoring Suppl DEVI, Use to check blood sugar twice a day.  DX R73.03. May substitute to any manufacturer covered by patient's insurance., Disp: 1 each, Rfl: 0   Calcium Carb-Cholecalciferol 600-800 MG-UNIT TABS, Take 1 tablet by mouth 2 (two) times  daily. , Disp: , Rfl:    cetirizine (ZYRTEC) 10 MG tablet, Take 10 mg by mouth daily., Disp: , Rfl:    clotrimazole-betamethasone (LOTRISONE) cream, APPLY 1-2 TIMES A DAY AS NEEDED FOR SPOT TREATMENT FOR YEAST SKIN RASH, Disp: 30 g, Rfl: 1   esomeprazole (NEXIUM) 20 MG capsule, Take 1 capsule (20 mg total) by mouth 2 (two) times daily before a meal., Disp: 180 capsule, Rfl: 1   fluticasone (FLONASE) 50 MCG/ACT nasal spray, Place 2 sprays into the nose at bedtime. , Disp: , Rfl:    Glucosamine-Chondroitin (COSAMIN DS PO), Take 1 tablet by mouth 2 (two) times daily., Disp: , Rfl:    Lancets Misc. MISC, Use to check blood sugar twice a day.  DX R73.03. May substitute to any manufacturer covered by patient's insurance., Disp: 100 each, Rfl: 0   lidocaine-prilocaine (EMLA) cream, Apply to affected area once, Disp: 30 g, Rfl: 3   Multiple Vitamins-Minerals (MULTIVITAMIN WITH MINERALS) tablet, Take 1 tablet by mouth every evening. Women's One-A-Day, Disp: , Rfl:    nystatin (MYCOSTATIN/NYSTOP) powder, APPLY TOPICALLY 3 TIMES A DAY,  Disp: 30 g, Rfl: 1   Omega-3 Fatty Acids (FISH OIL) 1000 MG CAPS, Take 1,000 mg by mouth daily., Disp: , Rfl:    ondansetron (ZOFRAN) 8 MG tablet, Take 1 tablet (8 mg total) by mouth every 8 (eight) hours as needed for nausea or vomiting., Disp: 30 tablet, Rfl: 1   ONETOUCH VERIO test strip, USE TO CHECK BLOOD SUGAR TWICE A DAY. DX R73.03. MAY SUBSTITUTE TO ANY MANUFACTURER COVERED BY PATIENT'S INSURANCE., Disp: 100 strip, Rfl: 0   prochlorperazine (COMPAZINE) 10 MG tablet, Take 1 tablet (10 mg total) by mouth every 6 (six) hours as needed for nausea or vomiting., Disp: 30 tablet, Rfl: 1   TURMERIC PO, Take 538 mg by mouth daily., Disp: , Rfl:  No current facility-administered medications for this visit.  Facility-Administered Medications Ordered in Other Visits:    0.9 %  sodium chloride infusion, , Intravenous, Continuous, Creig Hines, MD, Stopped at 09/02/23 1102  Physical exam:  Vitals:   09/02/23 0904 09/02/23 0910  BP: (!) 153/63 (!) 156/58  Pulse: 69   Resp: 17   Temp: (!) 96 F (35.6 C)   TempSrc: Tympanic   SpO2: 98%   Weight: 164 lb 12.8 oz (74.8 kg)    Physical Exam Cardiovascular:     Rate and Rhythm: Normal rate and regular rhythm.     Heart sounds: Normal heart sounds.  Pulmonary:     Effort: Pulmonary effort is normal.     Breath sounds: Normal breath sounds.  Skin:    General: Skin is warm and dry.  Neurological:     Mental Status: She is alert and oriented to person, place, and time.         Latest Ref Rng & Units 09/02/2023    8:42 AM  CMP  Glucose 70 - 99 mg/dL 98   BUN 8 - 23 mg/dL 18   Creatinine 1.61 - 1.00 mg/dL 0.96   Sodium 045 - 409 mmol/L 139   Potassium 3.5 - 5.1 mmol/L 4.0   Chloride 98 - 111 mmol/L 103   CO2 22 - 32 mmol/L 25   Calcium 8.9 - 10.3 mg/dL 8.8   Total Protein 6.5 - 8.1 g/dL 7.0   Total Bilirubin <8.1 mg/dL 0.8   Alkaline Phos 38 - 126 U/L 64   AST 15 - 41 U/L 21   ALT  0 - 44 U/L 20       Latest Ref Rng & Units  09/02/2023    8:42 AM  CBC  WBC 4.0 - 10.5 K/uL 7.3   Hemoglobin 12.0 - 15.0 g/dL 56.2   Hematocrit 13.0 - 46.0 % 40.4   Platelets 150 - 400 K/uL 153     No images are attached to the encounter.  IR IMAGING GUIDED PORT INSERTION  Result Date: 08/21/2023 INDICATION: 82 year old female referred for port catheter EXAM: IMAGE GUIDED PORT CATHETER MEDICATIONS: None ANESTHESIA/SEDATION: Moderate (conscious) sedation was employed during this procedure. A total of Versed 1.0 mg and Fentanyl 75 mcg was administered intravenously. Moderate Sedation Time: 18 minutes. The patient's level of consciousness and vital signs were monitored continuously by radiology nursing throughout the procedure under my direct supervision. FLUOROSCOPY TIME:  Fluoroscopy Time:  (1 mGy). COMPLICATIONS: None PROCEDURE: Informed written consent was obtained from the patient after a discussion of the risks, benefits, and alternatives to treatment. Questions regarding the procedure were encouraged and answered. The right neck and chest were prepped with chlorhexidine in a sterile fashion, and a sterile drape was applied covering the operative field. Maximum barrier sterile technique with sterile gowns and gloves were used for the procedure. A timeout was performed prior to the initiation of the procedure. Ultrasound survey was performed with images stored and sent to PACs. Right IJ vein documented to be patent. The right neck and chest was prepped with chlorhexidine, and draped in the usual sterile fashion using maximum barrier technique (cap and mask, sterile gown, sterile gloves, large sterile sheet, hand hygiene and cutaneous antiseptic). Local anesthesia was attained by infiltration with 1% lidocaine without epinephrine. Ultrasound demonstrated patency of the right internal jugular vein, and this was documented with an image. Under real-time ultrasound guidance, this vein was accessed with a 21 gauge micropuncture needle and image  documentation was performed. A small dermatotomy was made at the access site with an 11 scalpel. A 0.018" wire was advanced into the SVC and used to estimate the length of the internal catheter. The access needle exchanged for a 74F micropuncture vascular sheath. The 0.018" wire was then removed and a 0.035" wire advanced into the IVC. An appropriate location for the subcutaneous reservoir was selected below the clavicle and an incision was made through the skin and underlying soft tissues. The subcutaneous tissues were then dissected using a combination of blunt and sharp surgical technique and a pocket was formed. A single lumen power injectable portacatheter was then tunneled through the subcutaneous tissues from the pocket to the dermatotomy and the port reservoir placed within the subcutaneous pocket. The venous access site was then serially dilated and a peel away vascular sheath placed over the wire. The wire was removed and the port catheter advanced into position under fluoroscopic guidance. The catheter tip is positioned in the cavoatrial junction. This was documented with a spot image. The portacatheter was then tested and found to flush and aspirate well. The port was flushed with saline followed by 100 units/mL heparinized saline. The pocket was then closed in two layers using first subdermal inverted interrupted absorbable sutures followed by a running subcuticular suture. The epidermis was then sealed with Dermabond. The dermatotomy at the venous access site was also seal with Dermabond. Patient tolerated the procedure well and remained hemodynamically stable throughout. No complications encountered and no significant blood loss encountered IMPRESSION: Status post right IJ port catheter Signed, Yvone Neu. Loreta Ave DO, ABVM, RPVI [facility] Radiology Electronically Signed   By: Gilmer Mor D.O.   On: 08/21/2023 13:24   MR Brain W Wo Contrast  Result  Date: 08/19/2023 CLINICAL DATA:  Provided history: Melanoma of skin. Melanoma, stage IIB/III/IV, monitor. New malignant melanoma of scalp and neck. EXAM: MRI HEAD WITHOUT AND WITH CONTRAST TECHNIQUE: Multiplanar, multiecho pulse sequences of the brain and surrounding structures were obtained without and with intravenous contrast. CONTRAST:  7.85mL GADAVIST GADOBUTROL 1 MMOL/ML IV SOLN COMPARISON:  PET CT 06/11/2023 FINDINGS: Brain: No age advanced or lobar predominant parenchymal atrophy. Multifocal T2 FLAIR hyperintense signal abnormality within the cerebral white matter, nonspecific but compatible with minimal chronic small vessel ischemic disease. Moderate chronic small vessel ischemic changes within the pons. No cortical encephalomalacia is identified. There is no acute infarct. No evidence of an intracranial mass. No chronic intracranial blood products. No extra-axial fluid collection. No midline shift. No pathologic intracranial enhancement identified. Vascular: Maintained flow voids within the proximal large arterial vessels. Skull and upper cervical spine: No focal worrisome marrow lesion. Incompletely assessed cervical spondylosis. Sinuses/Orbits: No mass or acute finding within the imaged orbits. No significant paranasal sinus disease. Other: Small-volume fluid within the bilateral mastoid air cells. IMPRESSION: 1. No evidence of intracranial metastatic disease. 2. Chronic small vessel ischemic changes which are moderate in the pons and minimal in the cerebral white matter. 3. Small-volume fluid within the bilateral mastoid air cells. Electronically Signed   By: Jackey Loge D.O.   On: 08/19/2023 09:52     Assessment and plan- Patient is a 82 y.o. female with history of stage IIIb malignant melanoma T0 N1b M0 here for on treatment assessment prior to cycle 1 of adjuvant Keytruda  Counts okay to proceed with cycle 1 of adjuvant Keytruda today.  She starts radiation treatment next week and she will be  receiving 5 treatments every other day.  I will see her back in 3 weeks for cycle 2 of Keytruda.  Plan to get scans every 6 months.  MRI brain did not show any evidence of metastatic disease.  Discussed risks and benefits of Keytruda including all but not limited to autoimmune side effect such as skin rash diarrhea, endocrinopathies as well as need to monitor kidney and liver functions and pneumonitis.  Treatment will be given with a curative intent.  Patient understands and agrees to proceed as planned   Visit Diagnosis 1. Melanoma of skin (HCC)   2. Encounter for antineoplastic immunotherapy      Dr. Owens Shark, MD, MPH [facility] at [facility] [phone] 09/02/2023 11:22 AM

## 2023-09-03 ENCOUNTER — Telehealth: Payer: Self-pay

## 2023-09-03 LAB — T4: T4, Total: 7.1 ug/dL (ref 4.5–12.0)

## 2023-09-03 NOTE — Telephone Encounter (Signed)
Telephone call for follow up after first infusion but no answer and states patient does not have a voice mail set up.   Unable to leave message.

## 2023-09-08 DIAGNOSIS — C434 Malignant melanoma of scalp and neck: Secondary | ICD-10-CM | POA: Diagnosis not present

## 2023-09-08 DIAGNOSIS — Z510 Encounter for antineoplastic radiation therapy: Secondary | ICD-10-CM | POA: Diagnosis not present

## 2023-09-11 ENCOUNTER — Other Ambulatory Visit: Payer: PPO

## 2023-09-12 ENCOUNTER — Ambulatory Visit (INDEPENDENT_AMBULATORY_CARE_PROVIDER_SITE_OTHER): Payer: PPO | Admitting: Internal Medicine

## 2023-09-12 ENCOUNTER — Encounter: Payer: Self-pay | Admitting: Internal Medicine

## 2023-09-12 VITALS — BP 130/64 | Ht 61.0 in | Wt 165.0 lb

## 2023-09-12 DIAGNOSIS — E782 Mixed hyperlipidemia: Secondary | ICD-10-CM

## 2023-09-12 DIAGNOSIS — E66811 Obesity, class 1: Secondary | ICD-10-CM | POA: Diagnosis not present

## 2023-09-12 DIAGNOSIS — Z6831 Body mass index (BMI) 31.0-31.9, adult: Secondary | ICD-10-CM | POA: Diagnosis not present

## 2023-09-12 DIAGNOSIS — R7303 Prediabetes: Secondary | ICD-10-CM

## 2023-09-12 DIAGNOSIS — Z0001 Encounter for general adult medical examination with abnormal findings: Secondary | ICD-10-CM

## 2023-09-12 DIAGNOSIS — E6609 Other obesity due to excess calories: Secondary | ICD-10-CM

## 2023-09-12 DIAGNOSIS — Z1231 Encounter for screening mammogram for malignant neoplasm of breast: Secondary | ICD-10-CM

## 2023-09-12 MED ORDER — ESOMEPRAZOLE MAGNESIUM 20 MG PO CPDR: 20.0000 mg | DELAYED_RELEASE_CAPSULE | Freq: Two times a day (BID) | ORAL | 1 refills | Status: DC

## 2023-09-12 NOTE — Patient Instructions (Signed)
Health Maintenance for Postmenopausal Women Menopause is a normal process in which your ability to get pregnant comes to an end. This process happens slowly over many months or years, usually between the ages of 48 and 55. Menopause is complete when you have missed your menstrual period for 12 months. It is important to talk with your health care provider about some of the most common conditions that affect women after menopause (postmenopausal women). These include heart disease, cancer, and bone loss (osteoporosis). Adopting a healthy lifestyle and getting preventive care can help to promote your health and wellness. The actions you take can also lower your chances of developing some of these common conditions. What are the signs and symptoms of menopause? During menopause, you may have the following symptoms: Hot flashes. These can be moderate or severe. Night sweats. Decrease in sex drive. Mood swings. Headaches. Tiredness (fatigue). Irritability. Memory problems. Problems falling asleep or staying asleep. Talk with your health care provider about treatment options for your symptoms. Do I need hormone replacement therapy? Hormone replacement therapy is effective in treating symptoms that are caused by menopause, such as hot flashes and night sweats. Hormone replacement carries certain risks, especially as you become older. If you are thinking about using estrogen or estrogen with progestin, discuss the benefits and risks with your health care provider. How can I reduce my risk for heart disease and stroke? The risk of heart disease, heart attack, and stroke increases as you age. One of the causes may be a change in the body's hormones during menopause. This can affect how your body uses dietary fats, triglycerides, and cholesterol. Heart attack and stroke are medical emergencies. There are many things that you can do to help prevent heart disease and stroke. Watch your blood pressure High  blood pressure causes heart disease and increases the risk of stroke. This is more likely to develop in people who have high blood pressure readings or are overweight. Have your blood pressure checked: Every 3-5 years if you are 18-39 years of age. Every year if you are 40 years old or older. Eat a healthy diet  Eat a diet that includes plenty of vegetables, fruits, low-fat dairy products, and lean protein. Do not eat a lot of foods that are high in solid fats, added sugars, or sodium. Get regular exercise Get regular exercise. This is one of the most important things you can do for your health. Most adults should: Try to exercise for at least 150 minutes each week. The exercise should increase your heart rate and make you sweat (moderate-intensity exercise). Try to do strengthening exercises at least twice each week. Do these in addition to the moderate-intensity exercise. Spend less time sitting. Even light physical activity can be beneficial. Other tips Work with your health care provider to achieve or maintain a healthy weight. Do not use any products that contain nicotine or tobacco. These products include cigarettes, chewing tobacco, and vaping devices, such as e-cigarettes. If you need help quitting, ask your health care provider. Know your numbers. Ask your health care provider to check your cholesterol and your blood sugar (glucose). Continue to have your blood tested as directed by your health care provider. Do I need screening for cancer? Depending on your health history and family history, you may need to have cancer screenings at different stages of your life. This may include screening for: Breast cancer. Cervical cancer. Lung cancer. Colorectal cancer. What is my risk for osteoporosis? After menopause, you may be   at increased risk for osteoporosis. Osteoporosis is a condition in which bone destruction happens more quickly than new bone creation. To help prevent osteoporosis or  the bone fractures that can happen because of osteoporosis, you may take the following actions: If you are 19-50 years old, get at least 1,000 mg of calcium and at least 600 international units (IU) of vitamin D per day. If you are older than age 50 but younger than age 70, get at least 1,200 mg of calcium and at least 600 international units (IU) of vitamin D per day. If you are older than age 70, get at least 1,200 mg of calcium and at least 800 international units (IU) of vitamin D per day. Smoking and drinking excessive alcohol increase the risk of osteoporosis. Eat foods that are rich in calcium and vitamin D, and do weight-bearing exercises several times each week as directed by your health care provider. How does menopause affect my mental health? Depression may occur at any age, but it is more common as you become older. Common symptoms of depression include: Feeling depressed. Changes in sleep patterns. Changes in appetite or eating patterns. Feeling an overall lack of motivation or enjoyment of activities that you previously enjoyed. Frequent crying spells. Talk with your health care provider if you think that you are experiencing any of these symptoms. General instructions See your health care provider for regular wellness exams and vaccines. This may include: Scheduling regular health, dental, and eye exams. Getting and maintaining your vaccines. These include: Influenza vaccine. Get this vaccine each year before the flu season begins. Pneumonia vaccine. Shingles vaccine. Tetanus, diphtheria, and pertussis (Tdap) booster vaccine. Your health care provider may also recommend other immunizations. Tell your health care provider if you have ever been abused or do not feel safe at home. Summary Menopause is a normal process in which your ability to get pregnant comes to an end. This condition causes hot flashes, night sweats, decreased interest in sex, mood swings, headaches, or lack  of sleep. Treatment for this condition may include hormone replacement therapy. Take actions to keep yourself healthy, including exercising regularly, eating a healthy diet, watching your weight, and checking your blood pressure and blood sugar levels. Get screened for cancer and depression. Make sure that you are up to date with all your vaccines. This information is not intended to replace advice given to you by your health care provider. Make sure you discuss any questions you have with your health care provider. Document Revised: 02/12/2021 Document Reviewed: 02/12/2021 Elsevier Patient Education  2024 [employer].  

## 2023-09-12 NOTE — Progress Notes (Signed)
Subjective:    Patient ID: [name], female    DOB: November 08, 1940, 82 y.o.   MRN: [id number]  HPI  Patient presents to clinic today for annual exam.  Flu: 08/2023 Tetanus: 2023, [facility] COVID: X5 Pneumovax: 07/2007 Prevnar 20: 09/2022 Shingrix: 08/2018, 11/2018 Pap smear: No longer screening Mammogram: 10/2022 Bone density: 12/2020 Colon screening: 06/2021 Vision screening: annually Dentist: biannually  Diet: She does eat some meat. She consumes fruits and veggies. She does eat some fried foods. She drinks mostly coffee. Exercise: Walking   Review of Systems  Past Medical History:  Diagnosis Date   Allergy    Arthritis    Cancer (HCC)    History of chicken pox    History of colon polyps    History of mouth cancer    Osteoarthritis of knee    Medial compartment, Right    Current Outpatient Medications  Medication Sig Dispense Refill   aspirin EC 81 MG tablet Take 81 mg by mouth daily.     atorvastatin (LIPITOR) 10 MG tablet Take 1 tablet (10 mg total) by mouth daily. 90 tablet 1   Blood Glucose Monitoring Suppl DEVI Use to check blood sugar twice a day.  DX R73.03. May substitute to any manufacturer covered by patient's insurance. 1 each 0   Calcium Carb-Cholecalciferol 600-800 MG-UNIT TABS Take 1 tablet by mouth 2 (two) times daily.      cetirizine (ZYRTEC) 10 MG tablet Take 10 mg by mouth daily.     clotrimazole-betamethasone (LOTRISONE) cream APPLY 1-2 TIMES A DAY AS NEEDED FOR SPOT TREATMENT FOR YEAST SKIN RASH 30 g 1   esomeprazole (NEXIUM) 20 MG capsule Take 1 capsule (20 mg total) by mouth 2 (two) times daily before a meal. 180 capsule 1   fluticasone (FLONASE) 50 MCG/ACT nasal spray Place 2 sprays into the nose at bedtime.      Glucosamine-Chondroitin (COSAMIN DS PO) Take 1 tablet by mouth 2 (two) times daily.     Lancets Misc. MISC Use to check blood sugar twice a day.  DX R73.03. May substitute to any manufacturer covered by patient's insurance. 100 each 0    lidocaine-prilocaine (EMLA) cream Apply to affected area once 30 g 3   Multiple Vitamins-Minerals (MULTIVITAMIN WITH MINERALS) tablet Take 1 tablet by mouth every evening. Women's One-A-Day     nystatin (MYCOSTATIN/NYSTOP) powder APPLY TOPICALLY 3 TIMES A DAY 30 g 1   Omega-3 Fatty Acids (FISH OIL) 1000 MG CAPS Take 1,000 mg by mouth daily.     ondansetron (ZOFRAN) 8 MG tablet Take 1 tablet (8 mg total) by mouth every 8 (eight) hours as needed for nausea or vomiting. 30 tablet 1   ONETOUCH VERIO test strip USE TO CHECK BLOOD SUGAR TWICE A DAY. DX R73.03. MAY SUBSTITUTE TO ANY MANUFACTURER COVERED BY PATIENT'S INSURANCE. 100 strip 0   prochlorperazine (COMPAZINE) 10 MG tablet Take 1 tablet (10 mg total) by mouth every 6 (six) hours as needed for nausea or vomiting. 30 tablet 1   TURMERIC PO Take 538 mg by mouth daily.     No current facility-administered medications for this visit.    Allergies  Allergen Reactions   Ibuprofen Nausea And Vomiting and Other (See Comments)    GI upset, GERD   Azithromycin Hives    Itching, blotchy rash    Macrobid [Nitrofurantoin] Nausea And Vomiting    Family History  Problem Relation Age of Onset   Breast cancer Neg Hx  Social History   Socioeconomic History   Marital status: Married    Spouse name: Kushana Yebra   Number of children: 2   Years of education: Not on file   Highest education level: 12th grade  Occupational History   Occupation: Retired  Tobacco Use   Smoking status: Former    Current packs/day: 0.00    Types: Cigarettes    Quit date: 12/28/1986    Years since quitting: 36.7   Smokeless tobacco: Never  Vaping Use   Vaping status: Never Used  Substance and Sexual Activity   Alcohol use: No   Drug use: No   Sexual activity: Not Currently  Other Topics Concern   Not on file  Social History Narrative   Not on file   Social Determinants of Health   Financial Resource Strain: Low Risk  (08/22/2023)   Overall Financial  Resource Strain (CARDIA)    Difficulty of Paying Living Expenses: Not hard at all  Food Insecurity: No Food Insecurity (08/22/2023)   Hunger Vital Sign    Worried About Running Out of Food in the Last Year: Never true    Ran Out of Food in the Last Year: Never true  Transportation Needs: No Transportation Needs (08/22/2023)   PRAPARE - Administrator, Civil Service (Medical): No    Lack of Transportation (Non-Medical): No  Physical Activity: Insufficiently Active (08/22/2023)   Exercise Vital Sign    Days of Exercise per Week: 4 days    Minutes of Exercise per Session: 30 min  Stress: No Stress Concern Present (08/22/2023)   [name]    Feeling of Stress : Not at all  Social Connections: Moderately Integrated (08/22/2023)   Social Connection and Isolation Panel [NHANES]    Frequency of Communication with Friends and Family: More than three times a week    Frequency of Social Gatherings with Friends and Family: Once a week    Attends Religious Services: More than 4 times per year    Active Member of [employer] or Organizations: No    Attends Banker Meetings: Never    Marital Status: Married  Catering manager Violence: Not At Risk (08/22/2023)   Humiliation, Afraid, Rape, and Kick questionnaire    Fear of Current or Ex-Partner: No    Emotionally Abused: No    Physically Abused: No    Sexually Abused: No     Constitutional: Denies fever, malaise, fatigue, headache or abrupt weight changes.  HEENT: Denies eye pain, eye redness, ear pain, ringing in the ears, wax buildup, runny nose, nasal congestion, bloody nose, or sore throat. Respiratory: Denies difficulty breathing, shortness of breath, cough or sputum production.   Cardiovascular: Denies chest pain, chest tightness, palpitations or swelling in the hands or feet.  Gastrointestinal: Denies abdominal pain, bloating, constipation, diarrhea or  blood in the stool.  GU: Pt reports urinary frequency. Denies urgency, pain with urination, burning sensation, blood in urine, odor or discharge. Musculoskeletal: Patient reports joint pain.  Denies decrease in range of motion, difficulty with gait, muscle pain or joint swelling.  Skin: Patient reports mass to right side of neck.  Denies redness, rashes, or ulcercations.  Neurological: Denies dizziness, difficulty with memory, difficulty with speech or problems with balance and coordination.  Psych: Denies anxiety, depression, SI/HI.  No other specific complaints in a complete review of systems (except as listed in HPI above).     Objective:   Physical Exam BP  130/64   Ht 5\' 1"  (1.549 m)   Wt 165 lb (74.8 kg)   LMP  (LMP Unknown)   BMI 31.18 kg/m    Wt Readings from Last 3 Encounters:  09/02/23 164 lb 12.8 oz (74.8 kg)  08/22/23 166 lb (75.3 kg)  08/21/23 160 lb (72.6 kg)    General: Appears her stated age, obese, in NAD. Skin: Warm, dry and intact.  Hard, nonmobile mass noted to the right neck-undergoing treatment for necrotic adenopathy secondary to melanoma. HEENT: Head: normal shape and size; Eyes: sclera white, no icterus, conjunctiva pink, PERRLA and EOMs intact;  Neck:  Neck supple, trachea midline. No masses, lumps or thyromegaly present.  Cardiovascular: Normal rate and rhythm. S1,S2 noted.  No murmur, rubs or gallops noted. No JVD or BLE edema. No carotid bruits noted. Pulmonary/Chest: Normal effort and positive vesicular breath sounds. No respiratory distress. No wheezes, rales or ronchi noted.  Abdomen: Normal bowel sounds.  Musculoskeletal: Strength 5/5 BUE/BLE. No difficulty with gait.  Neurological: Alert and oriented. Cranial nerves II-XII grossly intact. Coordination normal.  Psychiatric: Mood and affect normal. Behavior is normal. Judgment and thought content normal.    BMET    Component Value Date/Time   NA 139 09/02/2023 0842   K 4.0 09/02/2023 0842   CL  103 09/02/2023 0842   CO2 25 09/02/2023 0842   GLUCOSE 98 09/02/2023 0842   BUN 18 09/02/2023 0842   CREATININE 0.56 09/02/2023 0842   CREATININE 0.75 03/13/2023 0832   CALCIUM 8.8 (L) 09/02/2023 0842   GFRNONAA >60 09/02/2023 0842   GFRNONAA >89 06/24/2016 0853   GFRAA >60 06/16/2019 0337   GFRAA >89 06/24/2016 0853    Lipid Panel     Component Value Date/Time   CHOL 127 03/13/2023 0832   TRIG 101 03/13/2023 0832   HDL 59 03/13/2023 0832   CHOLHDL 2.2 03/13/2023 0832   VLDL 30.4 08/22/2020 1211   LDLCALC 50 03/13/2023 0832    CBC    Component Value Date/Time   WBC 7.3 09/02/2023 0842   WBC 7.0 03/13/2023 0832   RBC 4.35 09/02/2023 0842   HGB 13.4 09/02/2023 0842   HCT 40.4 09/02/2023 0842   PLT 153 09/02/2023 0842   MCV 92.9 09/02/2023 0842   MCV 86.6 06/24/2016 0909   MCH 30.8 09/02/2023 0842   MCHC 33.2 09/02/2023 0842   RDW 12.6 09/02/2023 0842   LYMPHSABS 2.4 09/02/2023 0842   MONOABS 0.6 09/02/2023 0842   EOSABS 0.1 09/02/2023 0842   BASOSABS 0.0 09/02/2023 0842    Hgb A1C Lab Results  Component Value Date   HGBA1C 6.0 (H) 03/13/2023            Assessment & Plan:   Preventative Health Maintenance:  Flu shot UTD She declines tetanus for financial reasons, advised if she gets bit or cut to have this done Prevnar 20 and Pneumovax UTD Shingrix UTD Encouraged her to get her COVID booster She no longer needs to screen for cervical cancer Mammogram ordered-she will call to schedule Bone density UTD Colon screening UTD Encourage her to consume a balanced diet and exercise regimen Advised her to see an eye doctor and dentist annually CBC and c-Met reviewed. We will check lipid and A1c today  RTC in 6 months, follow-up chronic conditions Nicki Reaper, NP

## 2023-09-12 NOTE — Assessment & Plan Note (Signed)
Encourage diet and exercise for weight loss 

## 2023-09-13 LAB — LIPID PANEL
Cholesterol: 136 mg/dL (ref <200)
HDL: 53 mg/dL (ref >=50)
LDL Cholesterol (Calc): 64 mg/dL
Non-HDL Cholesterol (Calc): 83 mg/dL (ref <130)
Total CHOL/HDL Ratio: 2.6 (calc) (ref <5.0)
Triglycerides: 100 mg/dL (ref <150)

## 2023-09-13 LAB — HEMOGLOBIN A1C
Hgb A1c MFr Bld: 5.8 %{Hb} — ABNORMAL HIGH (ref <5.7)
Mean Plasma Glucose: 120 mg/dL
eAG (mmol/L): 6.6 mmol/L

## 2023-09-15 ENCOUNTER — Other Ambulatory Visit: Payer: Self-pay | Admitting: Internal Medicine

## 2023-09-15 DIAGNOSIS — E782 Mixed hyperlipidemia: Secondary | ICD-10-CM

## 2023-09-16 ENCOUNTER — Telehealth: Payer: Self-pay | Admitting: Internal Medicine

## 2023-09-16 ENCOUNTER — Ambulatory Visit: Payer: PPO

## 2023-09-16 ENCOUNTER — Ambulatory Visit: Payer: PPO | Admitting: Oncology

## 2023-09-16 ENCOUNTER — Other Ambulatory Visit: Payer: PPO

## 2023-09-16 NOTE — Telephone Encounter (Signed)
Refill sent in today in a separate refill encounter.

## 2023-09-16 NOTE — Telephone Encounter (Signed)
Requested Prescriptions  Pending Prescriptions Disp Refills   atorvastatin (LIPITOR) 10 MG tablet [Pharmacy Med Name: ATORVASTATIN 10 MG TABLET] 90 tablet 1    Sig: TAKE 1 TABLET BY MOUTH EVERY DAY     Cardiovascular:  Antilipid - Statins Failed - 09/15/2023 11:04 AM      Failed - Lipid Panel in normal range within the last 12 months    Cholesterol  Date Value Ref Range Status  09/12/2023 136 <200 mg/dL Final   LDL Cholesterol (Calc)  Date Value Ref Range Status  09/12/2023 64 mg/dL (calc) Final    Comment:    Reference range: <100 . Desirable range <100 mg/dL for primary prevention;   <70 mg/dL for patients with CHD or diabetic patients  with > or = 2 CHD risk factors. Marland Kitchen LDL-C is now calculated using the Martin-Hopkins  calculation, which is a validated novel method providing  better accuracy than the Friedewald equation in the  estimation of LDL-C.  Horald Pollen et al. Lenox Ahr. 6578;469(62): 2061-2068  (http://education.[facility].com/faq/FAQ164)    HDL  Date Value Ref Range Status  09/12/2023 53 > OR = 50 mg/dL Final   Triglycerides  Date Value Ref Range Status  09/12/2023 100 <150 mg/dL Final         Passed - Patient is not pregnant      Passed - Valid encounter within last 12 months    Recent Outpatient Visits           4 days ago Encounter for general adult medical examination with abnormal findings   [place] [facility] [place], Salvadore Oxford, NP   3 months ago Mass of right side of neck   [place] [facility] [place], Salvadore Oxford, NP   6 months ago Prediabetes   [place] [facility] [place], Salvadore Oxford, NP   8 months ago Candidal intertrigo   [place] [facility] Smitty Cords, DO   1 year ago Encounter for general adult medical examination with abnormal findings   [place] [facility] [place], Salvadore Oxford, NP       Future Appointments             In 5 months  [place], Salvadore Oxford, NP [place] [facility], [facility]

## 2023-09-16 NOTE — Telephone Encounter (Signed)
Medication Refill -  Most Recent Primary Care Visit:  Provider: Lorre Munroe  Department: [facility]  Visit Type: PHYSICAL 20  Date: 09/12/2023  Medication:  atorvastatin (LIPITOR) 10 MG tablet  *Patient will be out on Friday  *Patient states she saw [facility] on 09/12/2023 and Rene Kocher was supposed to send this medication in for her.  Has the patient contacted their pharmacy? Yes, advised to contact PCP office for refill.  Is this the correct pharmacy for this prescription? Yes, the one listed below.  This is the patient's preferred pharmacy:  [id number] - [place], [place] - [address] Phone: [phone] Fax: [phone]   Has the prescription been filled recently?  Receipt confirmed by pharmacy (09/16/2023  3:55 PM EST)  (Showing it was sent as I was typing this TE, states needs DX CODE???)  Is the patient out of the medication? Patient will not have any medication for Saturday  Has the patient been seen for an appointment in the last year OR does the patient have an upcoming appointment? Patient has a f/u appointment scheduled for 6.6.2025

## 2023-09-17 DIAGNOSIS — C434 Malignant melanoma of scalp and neck: Secondary | ICD-10-CM | POA: Diagnosis not present

## 2023-09-18 DIAGNOSIS — H6123 Impacted cerumen, bilateral: Secondary | ICD-10-CM | POA: Diagnosis not present

## 2023-09-23 ENCOUNTER — Encounter: Payer: Self-pay | Admitting: Oncology

## 2023-09-23 ENCOUNTER — Inpatient Hospital Stay: Payer: PPO

## 2023-09-23 ENCOUNTER — Inpatient Hospital Stay: Payer: PPO | Attending: Oncology | Admitting: Oncology

## 2023-09-23 VITALS — BP 147/55 | HR 74

## 2023-09-23 VITALS — BP 129/57 | HR 75 | Temp 97.5°F | Resp 18 | Wt 163.2 lb

## 2023-09-23 DIAGNOSIS — C439 Malignant melanoma of skin, unspecified: Secondary | ICD-10-CM | POA: Insufficient documentation

## 2023-09-23 DIAGNOSIS — C770 Secondary and unspecified malignant neoplasm of lymph nodes of head, face and neck: Secondary | ICD-10-CM | POA: Insufficient documentation

## 2023-09-23 DIAGNOSIS — Z79899 Other long term (current) drug therapy: Secondary | ICD-10-CM | POA: Insufficient documentation

## 2023-09-23 DIAGNOSIS — Z85820 Personal history of malignant melanoma of skin: Secondary | ICD-10-CM | POA: Diagnosis not present

## 2023-09-23 DIAGNOSIS — Z5112 Encounter for antineoplastic immunotherapy: Secondary | ICD-10-CM | POA: Diagnosis not present

## 2023-09-23 DIAGNOSIS — Z95828 Presence of other vascular implants and grafts: Secondary | ICD-10-CM

## 2023-09-23 LAB — CBC WITH DIFFERENTIAL (CANCER CENTER ONLY)
Abs Immature Granulocytes: 0 10*3/uL (ref 0.00–0.07)
Basophils Absolute: 0 10*3/uL (ref 0.0–0.1)
Basophils Relative: 1 %
Eosinophils Absolute: 0.2 10*3/uL (ref 0.0–0.5)
Eosinophils Relative: 3 %
HCT: 41.9 % (ref 36.0–46.0)
Hemoglobin: 13.7 g/dL (ref 12.0–15.0)
Immature Granulocytes: 0 %
Lymphocytes Relative: 32 %
Lymphs Abs: 1.8 10*3/uL (ref 0.7–4.0)
MCH: 30 pg (ref 26.0–34.0)
MCHC: 32.7 g/dL (ref 30.0–36.0)
MCV: 91.9 fL (ref 80.0–100.0)
Monocytes Absolute: 0.5 10*3/uL (ref 0.1–1.0)
Monocytes Relative: 9 %
Neutro Abs: 3.1 10*3/uL (ref 1.7–7.7)
Neutrophils Relative %: 55 %
Platelet Count: 167 10*3/uL (ref 150–400)
RBC: 4.56 MIL/uL (ref 3.87–5.11)
RDW: 12.4 % (ref 11.5–15.5)
WBC Count: 5.7 10*3/uL (ref 4.0–10.5)
nRBC: 0 % (ref 0.0–0.2)

## 2023-09-23 LAB — CMP (CANCER CENTER ONLY)
ALT: 19 U/L (ref 0–44)
AST: 21 U/L (ref 15–41)
Albumin: 3.6 g/dL (ref 3.5–5.0)
Alkaline Phosphatase: 61 U/L (ref 38–126)
Anion gap: 7 (ref 5–15)
BUN: 18 mg/dL (ref 8–23)
CO2: 27 mmol/L (ref 22–32)
Calcium: 8.9 mg/dL (ref 8.9–10.3)
Chloride: 105 mmol/L (ref 98–111)
Creatinine: 0.67 mg/dL (ref 0.44–1.00)
GFR, Estimated: >60 mL/min (ref >60)
Glucose, Bld: 102 mg/dL — ABNORMAL HIGH (ref 70–99)
Potassium: 4.2 mmol/L (ref 3.5–5.1)
Sodium: 139 mmol/L (ref 135–145)
Total Bilirubin: 0.3 mg/dL (ref <1.2)
Total Protein: 6.4 g/dL — ABNORMAL LOW (ref 6.5–8.1)

## 2023-09-23 MED ORDER — HEPARIN SOD (PORK) LOCK FLUSH 100 UNIT/ML IV SOLN
500.0000 [IU] | Freq: Once | INTRAVENOUS | Status: AC
Start: 2023-09-23 — End: 2023-09-23
  Administered 2023-09-23: 500 [IU] via INTRAVENOUS
  Filled 2023-09-23: qty 5

## 2023-09-23 MED ORDER — SODIUM CHLORIDE 0.9 % IV SOLN
INTRAVENOUS | Status: DC
  Filled 2023-09-23: qty 250

## 2023-09-23 MED ORDER — SODIUM CHLORIDE 0.9% FLUSH
10.0000 mL | Freq: Once | INTRAVENOUS | Status: AC
Start: 2023-09-23 — End: 2023-09-23
  Administered 2023-09-23: 10 mL via INTRAVENOUS
  Filled 2023-09-23: qty 10

## 2023-09-23 MED ORDER — SODIUM CHLORIDE 0.9 % IV SOLN
200.0000 mg | Freq: Once | INTRAVENOUS | Status: AC
  Administered 2023-09-23: 200 mg via INTRAVENOUS
  Filled 2023-09-23: qty 8

## 2023-09-23 NOTE — Progress Notes (Signed)
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  This was followed by a PET CT scan on 06/11/2023 which showed a 2.8 cm right level 2 lymph node with an SUV of 31.  Peripheral nodular hypermetabolism compatible with irregular rim enhancement.  12 mm nodular component along the inferior aspect of the lesion measuring SUV 10.1.  No other evidence of distant metastatic disease.  Core biopsy showed necrosis but was not conclusive for malignancy.  She was then referred to [facility] and underwent definitive excision of the right cervical lymph node on 07/17/2023.  Pathology showed malignant neoplasm with extensive necrosis consistent with metastatic melanoma.  BRAF negative surgical margins negative.  Right level 2A2B3 and 4 lymph nodes and right tonsil as well as right retromolar trigone biopsy all negative for malignancy. She was staged as stage IIIB T0 N1b M0.  She received adjuvant radiation therapy in [facility]  Pros and cons of adjuvant immunotherapy discussed with the patient and patient chose to receive care closer to home in [place].    Interval history-tolerated cycle 1 of Keytruda well without any significant side effects.  She reports some dry mouth and throat after receiving radiation  ECOG PS- 1 Pain scale- 0   Review of systems- Review of Systems  Constitutional:  Negative for  chills, fever, malaise/fatigue and weight loss.  HENT:  Negative for congestion, ear discharge and nosebleeds.   Eyes:  Negative for blurred vision.  Respiratory:  Negative for cough, hemoptysis, sputum production, shortness of breath and wheezing.   Cardiovascular:  Negative for chest pain, palpitations, orthopnea and claudication.  Gastrointestinal:  Negative for abdominal pain, blood in stool, constipation, diarrhea, heartburn, melena, nausea and vomiting.  Genitourinary:  Negative for dysuria, flank pain, frequency, hematuria and urgency.  Musculoskeletal:  Negative for back pain, joint pain and myalgias.  Skin:  Negative for rash.  Neurological:  Negative for dizziness, tingling, focal weakness, seizures, weakness and headaches.  Endo/Heme/Allergies:  Does not bruise/bleed easily.  Psychiatric/Behavioral:  Negative for depression and suicidal ideas. The patient does not have insomnia.       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   IR IMAGING GUIDED PORT INSERTION  08/21/2023   KNEE ARTHROSCOPY  W/ MENISCAL REPAIR Left    PARTIAL KNEE ARTHROPLASTY Right 09/16/2017   PARTIAL KNEE ARTHROPLASTY Right 09/16/2017   Procedure: RIGHT UNICOMPARTMENTAL KNEE;  Surgeon: Kathryne Hitch, MD;  Location: [facility] OR;  Service: Orthopedics;  Laterality: Right;   SHOULDER ARTHROSCOPY Left 11/2018   TOTAL KNEE REVISION Right 06/15/2019   Procedure: RIGHT UNICOMPARTMENTAL  ARTHROPLASTY TO TOTAL KNEE ARTHROPLASTY;  Surgeon: Kathryne Hitch, MD;  Location: [facility] OR;  Service: Orthopedics;  Laterality: Right;   TUBAL LIGATION      Social History   Socioeconomic History   Marital status: Married    Spouse name: Malka Lupton   Number of children: 2   Years of education: Not on file   Highest education level: 12th grade  Occupational History   Occupation: Retired  Tobacco Use   Smoking status: Former    Current packs/day: 0.00    Types: Cigarettes    Quit date: 12/28/1986    Years since quitting: 36.7   Smokeless tobacco: Never  Vaping Use   Vaping status: Never Used  Substance and Sexual Activity   Alcohol use: No   Drug use: No   Sexual activity: Not Currently  Other Topics Concern   Not on file  Social History Narrative   Not on file   Social Drivers of Health   Financial Resource Strain: Low Risk  (08/22/2023)   Overall Financial Resource Strain (CARDIA)    Difficulty of Paying Living Expenses: Not hard at all  Food Insecurity: No Food Insecurity (08/22/2023)   Hunger Vital Sign    Worried About Running Out of Food in the Last Year: Never true    Ran Out of Food in the Last Year: Never true  Transportation Needs: No Transportation Needs (08/22/2023)   PRAPARE - Administrator, Civil Service (Medical): No    Lack of Transportation (Non-Medical): No  Physical Activity: Insufficiently Active (08/22/2023)   Exercise Vital Sign    Days of Exercise per Week: 4 days    Minutes of Exercise per Session: 30 min  Stress: No Stress Concern Present (08/22/2023)   [name]    Feeling of Stress : Not at all  Social Connections: Moderately Integrated (08/22/2023)   Social Connection and Isolation Panel [NHANES]    Frequency of Communication with Friends and Family: More than three times a week    Frequency of Social Gatherings with Friends and Family: Once a week     Attends Religious Services: More than 4 times per year    Active Member of [employer] or Organizations: No    Attends Banker Meetings: Never    Marital Status: Married  Catering manager Violence: Not At Risk (08/22/2023)   Humiliation, Afraid, Rape, and Kick questionnaire    Fear of Current or Ex-Partner: No    Emotionally Abused: No    Physically Abused: No    Sexually Abused: No    Family History  Problem Relation Age of Onset   Breast cancer Neg Hx      Current Outpatient Medications:    aspirin EC 81 MG tablet, Take 81 mg by mouth daily., Disp: , Rfl:    atorvastatin (LIPITOR) 10 MG tablet, TAKE 1 TABLET BY MOUTH EVERY DAY, Disp: 90 tablet, Rfl: 1   Blood Glucose Monitoring Suppl DEVI, Use to check blood sugar twice a day.  DX R73.03. May substitute to any manufacturer covered by patient's insurance., Disp: 1 each, Rfl:  0   Calcium Carb-Cholecalciferol 600-800 MG-UNIT TABS, Take 1 tablet by mouth 2 (two) times daily. , Disp: , Rfl:    cetirizine (ZYRTEC) 10 MG tablet, Take 10 mg by mouth daily., Disp: , Rfl:    clotrimazole-betamethasone (LOTRISONE) cream, APPLY 1-2 TIMES A DAY AS NEEDED FOR SPOT TREATMENT FOR YEAST SKIN RASH, Disp: 30 g, Rfl: 1   esomeprazole (NEXIUM) 20 MG capsule, Take 1 capsule (20 mg total) by mouth 2 (two) times daily before a meal., Disp: 180 capsule, Rfl: 1   fluticasone (FLONASE) 50 MCG/ACT nasal spray, Place 2 sprays into the nose at bedtime. , Disp: , Rfl:    Glucosamine-Chondroitin (COSAMIN DS PO), Take 1 tablet by mouth 2 (two) times daily., Disp: , Rfl:    Lancets Misc. MISC, Use to check blood sugar twice a day.  DX R73.03. May substitute to any manufacturer covered by patient's insurance., Disp: 100 each, Rfl: 0   lidocaine-prilocaine (EMLA) cream, Apply to affected area once, Disp: 30 g, Rfl: 3   Multiple Vitamins-Minerals (MULTIVITAMIN WITH MINERALS) tablet, Take 1 tablet by mouth every evening. Women's One-A-Day, Disp: , Rfl:     nystatin (MYCOSTATIN/NYSTOP) powder, APPLY TOPICALLY 3 TIMES A DAY, Disp: 30 g, Rfl: 1   Omega-3 Fatty Acids (FISH OIL) 1000 MG CAPS, Take 1,000 mg by mouth daily., Disp: , Rfl:    ondansetron (ZOFRAN) 8 MG tablet, Take 1 tablet (8 mg total) by mouth every 8 (eight) hours as needed for nausea or vomiting., Disp: 30 tablet, Rfl: 1   ONETOUCH VERIO test strip, USE TO CHECK BLOOD SUGAR TWICE A DAY. DX R73.03. MAY SUBSTITUTE TO ANY MANUFACTURER COVERED BY PATIENT'S INSURANCE., Disp: 100 strip, Rfl: 0   prochlorperazine (COMPAZINE) 10 MG tablet, Take 1 tablet (10 mg total) by mouth every 6 (six) hours as needed for nausea or vomiting., Disp: 30 tablet, Rfl: 1   TURMERIC PO, Take 538 mg by mouth daily., Disp: , Rfl:  No current facility-administered medications for this visit.  Facility-Administered Medications Ordered in Other Visits:    0.9 %  sodium chloride infusion, , Intravenous, Continuous, Creig Hines, MD, Stopped at 09/23/23 1109  Physical exam:  Vitals:   09/23/23 0841  BP: (!) 129/57  Pulse: 75  Resp: 18  Temp: (!) 97.5 F (36.4 C)  TempSrc: Tympanic  SpO2: 96%  Weight: 163 lb 3.2 oz (74 kg)   Physical Exam HENT:     Mouth/Throat:     Mouth: Mucous membranes are moist.     Pharynx: Oropharynx is clear.  Cardiovascular:     Rate and Rhythm: Normal rate and regular rhythm.     Heart sounds: Normal heart sounds.  Pulmonary:     Effort: Pulmonary effort is normal.  Skin:    General: Skin is warm and dry.  Neurological:     Mental Status: She is alert and oriented to person, place, and time.         Latest Ref Rng & Units 09/23/2023    8:23 AM  CMP  Glucose 70 - 99 mg/dL 409   BUN 8 - 23 mg/dL 18   Creatinine 8.11 - 1.00 mg/dL 9.14   Sodium 782 - 956 mmol/L 139   Potassium 3.5 - 5.1 mmol/L 4.2   Chloride 98 - 111 mmol/L 105   CO2 22 - 32 mmol/L 27   Calcium 8.9 - 10.3 mg/dL 8.9   Total Protein 6.5 - 8.1 g/dL 6.4   Total Bilirubin <2.1 mg/dL  0.3   Alkaline Phos  38 - 126 U/L 61   AST 15 - 41 U/L 21   ALT 0 - 44 U/L 19       Latest Ref Rng & Units 09/23/2023    8:23 AM  CBC  WBC 4.0 - 10.5 K/uL 5.7   Hemoglobin 12.0 - 15.0 g/dL 16.1   Hematocrit 09.6 - 46.0 % 41.9   Platelets 150 - 400 K/uL 167     Assessment and plan- Patient is a 82 y.o. female  with history of stage IIIb malignant melanoma T0 N1b M0.  She is here for on treatment assessment prior to cycle 2 of adjuvant Keytruda  Counts okay to proceed with cycle 2 of adjuvant Keytruda today.  She will be seen by covering provider in 3 weeks for cycle 3 and I will see her back in 6 weeks for cycle 4.  Plan is to complete 1 year of adjuvant treatment.   Visit Diagnosis 1. Melanoma of skin (HCC)   2. Encounter for antineoplastic immunotherapy      Dr. Owens Shark, MD, MPH [facility] [phone] 09/23/2023 12:45 PM

## 2023-09-23 NOTE — Patient Instructions (Signed)
 [facility] - A DEPT OF [facility]  Discharge Instructions: Thank you for choosing [facility] to provide your oncology and hematology care.  If you have a lab appointment with the Cancer Center, please go directly to the Cancer Center and check in at the registration area.  Wear comfortable clothing and clothing appropriate for easy access to any Portacath or PICC line.   We strive to give you quality time with your provider. You may need to reschedule your appointment if you arrive late (15 or more minutes).  Arriving late affects you and other patients whose appointments are after yours.  Also, if you miss three or more appointments without notifying the office, you may be dismissed from the clinic at the provider's discretion.      For prescription refill requests, have your pharmacy contact our office and allow 72 hours for refills to be completed.    Today you received the following chemotherapy and/or immunotherapy agents Keytruda      To help prevent nausea and vomiting after your treatment, we encourage you to take your nausea medication as directed.  BELOW ARE SYMPTOMS THAT SHOULD BE REPORTED IMMEDIATELY: *FEVER GREATER THAN 100.4 F (38 C) OR HIGHER *CHILLS OR SWEATING *NAUSEA AND VOMITING THAT IS NOT CONTROLLED WITH YOUR NAUSEA MEDICATION *UNUSUAL SHORTNESS OF BREATH *UNUSUAL BRUISING OR BLEEDING *URINARY PROBLEMS (pain or burning when urinating, or frequent urination) *BOWEL PROBLEMS (unusual diarrhea, constipation, pain near the anus) TENDERNESS IN MOUTH AND THROAT WITH OR WITHOUT PRESENCE OF ULCERS (sore throat, sores in mouth, or a toothache) UNUSUAL RASH, SWELLING OR PAIN  UNUSUAL VAGINAL DISCHARGE OR ITCHING   Items with * indicate a potential emergency and should be followed up as soon as possible or go to the Emergency Department if any problems should occur.  Please show the CHEMOTHERAPY ALERT CARD or IMMUNOTHERAPY  ALERT CARD at check-in to the Emergency Department and triage nurse.  Should you have questions after your visit or need to cancel or reschedule your appointment, please contact [facility] - A DEPT OF [facility]  [phone] and follow the prompts.  Office hours are 8:00 a.m. to 4:30 p.m. Monday - Friday. Please note that voicemails left after 4:00 p.m. may not be returned until the following business day.  We are closed weekends and major holidays. You have access to a nurse at all times for urgent questions. Please call the main number to the clinic [phone] and follow the prompts.  For any non-urgent questions, you may also contact your provider using MyChart. We now offer e-Visits for anyone 42 and older to request care online for non-urgent symptoms. For details visit [email or web address].   Also download the MyChart app! Go to the app store, search "MyChart", open the app, select [place], and log in with your MyChart username and password.

## 2023-09-24 DIAGNOSIS — N3281 Overactive bladder: Secondary | ICD-10-CM | POA: Diagnosis not present

## 2023-09-24 DIAGNOSIS — N3946 Mixed incontinence: Secondary | ICD-10-CM | POA: Diagnosis not present

## 2023-09-24 DIAGNOSIS — N3000 Acute cystitis without hematuria: Secondary | ICD-10-CM | POA: Diagnosis not present

## 2023-10-09 ENCOUNTER — Encounter: Payer: Self-pay | Admitting: Oncology

## 2023-10-10 ENCOUNTER — Ambulatory Visit
Admission: RE | Admit: 2023-10-10 | Discharge: 2023-10-10 | Disposition: A | Discharge status: Home / Self Care | Payer: PPO | Source: Ambulatory Visit | Attending: Internal Medicine | Admitting: Internal Medicine

## 2023-10-10 DIAGNOSIS — Z1231 Encounter for screening mammogram for malignant neoplasm of breast: Secondary | ICD-10-CM

## 2023-10-14 ENCOUNTER — Inpatient Hospital Stay: Payer: PPO

## 2023-10-14 ENCOUNTER — Inpatient Hospital Stay: Payer: PPO | Attending: Oncology | Admitting: Nurse Practitioner

## 2023-10-14 ENCOUNTER — Encounter: Payer: Self-pay | Admitting: Nurse Practitioner

## 2023-10-14 VITALS — BP 137/63 | HR 72 | Temp 96.4°F | Wt 163.2 lb

## 2023-10-14 DIAGNOSIS — C770 Secondary and unspecified malignant neoplasm of lymph nodes of head, face and neck: Secondary | ICD-10-CM | POA: Diagnosis not present

## 2023-10-14 DIAGNOSIS — C439 Malignant melanoma of skin, unspecified: Secondary | ICD-10-CM

## 2023-10-14 DIAGNOSIS — Z5112 Encounter for antineoplastic immunotherapy: Secondary | ICD-10-CM | POA: Insufficient documentation

## 2023-10-14 DIAGNOSIS — Z923 Personal history of irradiation: Secondary | ICD-10-CM | POA: Diagnosis not present

## 2023-10-14 DIAGNOSIS — L270 Generalized skin eruption due to drugs and medicaments taken internally: Secondary | ICD-10-CM

## 2023-10-14 DIAGNOSIS — K117 Disturbances of salivary secretion: Secondary | ICD-10-CM | POA: Diagnosis not present

## 2023-10-14 DIAGNOSIS — Z79899 Other long term (current) drug therapy: Secondary | ICD-10-CM | POA: Insufficient documentation

## 2023-10-14 DIAGNOSIS — Y842 Radiological procedure and radiotherapy as the cause of abnormal reaction of the patient, or of later complication, without mention of misadventure at the time of the procedure: Secondary | ICD-10-CM

## 2023-10-14 DIAGNOSIS — Z87891 Personal history of nicotine dependence: Secondary | ICD-10-CM | POA: Diagnosis not present

## 2023-10-14 LAB — CBC WITH DIFFERENTIAL (CANCER CENTER ONLY)
Abs Immature Granulocytes: 0.01 10*3/uL (ref 0.00–0.07)
Basophils Absolute: 0 10*3/uL (ref 0.0–0.1)
Basophils Relative: 0 %
Eosinophils Absolute: 0.2 10*3/uL (ref 0.0–0.5)
Eosinophils Relative: 4 %
HCT: 40.2 % (ref 36.0–46.0)
Hemoglobin: 13.4 g/dL (ref 12.0–15.0)
Immature Granulocytes: 0 %
Lymphocytes Relative: 28 %
Lymphs Abs: 1.8 10*3/uL (ref 0.7–4.0)
MCH: 30.2 pg (ref 26.0–34.0)
MCHC: 33.3 g/dL (ref 30.0–36.0)
MCV: 90.7 fL (ref 80.0–100.0)
Monocytes Absolute: 0.5 10*3/uL (ref 0.1–1.0)
Monocytes Relative: 9 %
Neutro Abs: 3.7 10*3/uL (ref 1.7–7.7)
Neutrophils Relative %: 59 %
Platelet Count: 162 10*3/uL (ref 150–400)
RBC: 4.43 MIL/uL (ref 3.87–5.11)
RDW: 12.4 % (ref 11.5–15.5)
WBC Count: 6.3 10*3/uL (ref 4.0–10.5)
nRBC: 0 % (ref 0.0–0.2)

## 2023-10-14 LAB — CMP (CANCER CENTER ONLY)
ALT: 18 U/L (ref 0–44)
AST: 22 U/L (ref 15–41)
Albumin: 3.9 g/dL (ref 3.5–5.0)
Alkaline Phosphatase: 55 U/L (ref 38–126)
Anion gap: 10 (ref 5–15)
BUN: 20 mg/dL (ref 8–23)
CO2: 26 mmol/L (ref 22–32)
Calcium: 9.1 mg/dL (ref 8.9–10.3)
Chloride: 104 mmol/L (ref 98–111)
Creatinine: 0.59 mg/dL (ref 0.44–1.00)
GFR, Estimated: >60 mL/min (ref >60)
Glucose, Bld: 115 mg/dL — ABNORMAL HIGH (ref 70–99)
Potassium: 3.8 mmol/L (ref 3.5–5.1)
Sodium: 140 mmol/L (ref 135–145)
Total Bilirubin: 0.7 mg/dL (ref 0.0–1.2)
Total Protein: 6.9 g/dL (ref 6.5–8.1)

## 2023-10-14 LAB — TSH: TSH: 0.736 u[IU]/mL (ref 0.350–4.500)

## 2023-10-14 MED ORDER — HEPARIN SOD (PORK) LOCK FLUSH 100 UNIT/ML IV SOLN
500.0000 [IU] | Freq: Once | INTRAVENOUS | Status: AC | PRN
  Administered 2023-10-14: 500 [IU]
  Filled 2023-10-14: qty 5

## 2023-10-14 MED ORDER — SODIUM CHLORIDE 0.9 % IV SOLN
INTRAVENOUS | Status: DC
  Filled 2023-10-14: qty 250

## 2023-10-14 MED ORDER — SODIUM CHLORIDE 0.9 % IV SOLN
200.0000 mg | Freq: Once | INTRAVENOUS | Status: AC
  Administered 2023-10-14: 200 mg via INTRAVENOUS
  Filled 2023-10-14: qty 200

## 2023-10-14 MED ORDER — PILOCARPINE HCL 5 MG PO TABS: 5.0000 mg | ORAL_TABLET | Freq: Three times a day (TID) | ORAL | 1 refills | Status: DC

## 2023-10-14 NOTE — Patient Instructions (Signed)
 [facility] - A DEPT OF [facility]  Discharge Instructions: Thank you for choosing [facility] to provide your oncology and hematology care.  If you have a lab appointment with the Cancer Center, please go directly to the Cancer Center and check in at the registration area.  Wear comfortable clothing and clothing appropriate for easy access to any Portacath or PICC line.   We strive to give you quality time with your provider. You may need to reschedule your appointment if you arrive late (15 or more minutes).  Arriving late affects you and other patients whose appointments are after yours.  Also, if you miss three or more appointments without notifying the office, you may be dismissed from the clinic at the provider's discretion.      For prescription refill requests, have your pharmacy contact our office and allow 72 hours for refills to be completed.    Today you received the following chemotherapy and/or immunotherapy agents KEYTRUDA      To help prevent nausea and vomiting after your treatment, we encourage you to take your nausea medication as directed.  BELOW ARE SYMPTOMS THAT SHOULD BE REPORTED IMMEDIATELY: *FEVER GREATER THAN 100.4 F (38 C) OR HIGHER *CHILLS OR SWEATING *NAUSEA AND VOMITING THAT IS NOT CONTROLLED WITH YOUR NAUSEA MEDICATION *UNUSUAL SHORTNESS OF BREATH *UNUSUAL BRUISING OR BLEEDING *URINARY PROBLEMS (pain or burning when urinating, or frequent urination) *BOWEL PROBLEMS (unusual diarrhea, constipation, pain near the anus) TENDERNESS IN MOUTH AND THROAT WITH OR WITHOUT PRESENCE OF ULCERS (sore throat, sores in mouth, or a toothache) UNUSUAL RASH, SWELLING OR PAIN  UNUSUAL VAGINAL DISCHARGE OR ITCHING   Items with * indicate a potential emergency and should be followed up as soon as possible or go to the Emergency Department if any problems should occur.  Please show the CHEMOTHERAPY ALERT CARD or IMMUNOTHERAPY  ALERT CARD at check-in to the Emergency Department and triage nurse.  Should you have questions after your visit or need to cancel or reschedule your appointment, please contact [facility] - A DEPT OF [facility]  [phone] and follow the prompts.  Office hours are 8:00 a.m. to 4:30 p.m. Monday - Friday. Please note that voicemails left after 4:00 p.m. may not be returned until the following business day.  We are closed weekends and major holidays. You have access to a nurse at all times for urgent questions. Please call the main number to the clinic [phone] and follow the prompts.  For any non-urgent questions, you may also contact your provider using MyChart. We now offer e-Visits for anyone 71 and older to request care online for non-urgent symptoms. For details visit [email or web address].   Also download the MyChart app! Go to the app store, search "MyChart", open the app, select [place], and log in with your MyChart username and password.   Pembrolizumab Injection What is this medication? PEMBROLIZUMAB (PEM broe LIZ ue mab) treats some types of cancer. It works by helping your immune system slow or stop the spread of cancer cells. It is a monoclonal antibody. This medicine may be used for other purposes; ask your health care provider or pharmacist if you have questions. COMMON BRAND NAME(S): Keytruda What should I tell my care team before I take this medication? They need to know if you have any of these conditions: Allogeneic stem cell transplant (uses someone else's stem cells) Autoimmune diseases, such as Crohn  disease, ulcerative colitis, lupus History of chest radiation Nervous system problems, such as Guillain-Barre syndrome, myasthenia gravis Organ transplant An unusual or allergic reaction to pembrolizumab, other medications, foods, dyes, or preservatives Pregnant or trying to get pregnant Breast-feeding How should I use this  medication? This medication is injected into a vein. It is given by your care team in a hospital or clinic setting. A special MedGuide will be given to you before each treatment. Be sure to read this information carefully each time. Talk to your care team about the use of this medication in children. While it may be prescribed for children as young as 6 months for selected conditions, precautions do apply. Overdosage: If you think you have taken too much of this medicine contact a poison control center or emergency room at once. NOTE: This medicine is only for you. Do not share this medicine with others. What if I miss a dose? Keep appointments for follow-up doses. It is important not to miss your dose. Call your care team if you are unable to keep an appointment. What may interact with this medication? Interactions have not been studied. This list may not describe all possible interactions. Give your health care provider a list of all the medicines, herbs, non-prescription drugs, or dietary supplements you use. Also tell them if you smoke, drink alcohol, or use illegal drugs. Some items may interact with your medicine. What should I watch for while using this medication? Your condition will be monitored carefully while you are receiving this medication. You may need blood work while taking this medication. This medication may cause serious skin reactions. They can happen weeks to months after starting the medication. Contact your care team right away if you notice fevers or flu-like symptoms with a rash. The rash may be red or purple and then turn into blisters or peeling of the skin. You may also notice a red rash with swelling of the face, lips, or lymph nodes in your neck or under your arms. Tell your care team right away if you have any change in your eyesight. Talk to your care team if you may be pregnant. Serious birth defects can occur if you take this medication during pregnancy and for 4  months after the last dose. You will need a negative pregnancy test before starting this medication. Contraception is recommended while taking this medication and for 4 months after the last dose. Your care team can help you find the option that works for you. Do not breastfeed while taking this medication and for 4 months after the last dose. What side effects may I notice from receiving this medication? Side effects that you should report to your care team as soon as possible: Allergic reactions--skin rash, itching, hives, swelling of the face, lips, tongue, or throat Dry cough, shortness of breath or trouble breathing Eye pain, redness, irritation, or discharge with blurry or decreased vision Heart muscle inflammation--unusual weakness or fatigue, shortness of breath, chest pain, fast or irregular heartbeat, dizziness, swelling of the ankles, feet, or hands Hormone gland problems--headache, sensitivity to light, unusual weakness or fatigue, dizziness, fast or irregular heartbeat, increased sensitivity to cold or heat, excessive sweating, constipation, hair loss, increased thirst or amount of urine, tremors or shaking, irritability Infusion reactions--chest pain, shortness of breath or trouble breathing, feeling faint or lightheaded Kidney injury (glomerulonephritis)--decrease in the amount of urine, red or dark brown urine, foamy or bubbly urine, swelling of the ankles, hands, or feet Liver injury--right upper belly pain,  loss of appetite, nausea, light-colored stool, dark yellow or brown urine, yellowing skin or eyes, unusual weakness or fatigue Pain, tingling, or numbness in the hands or feet, muscle weakness, change in vision, confusion or trouble speaking, loss of balance or coordination, trouble walking, seizures Rash, fever, and swollen lymph nodes Redness, blistering, peeling, or loosening of the skin, including inside the mouth Sudden or severe stomach pain, bloody diarrhea, fever, nausea,  vomiting Side effects that usually do not require medical attention (report to your care team if they continue or are bothersome): Bone, joint, or muscle pain Diarrhea Fatigue Loss of appetite Nausea Skin rash This list may not describe all possible side effects. Call your doctor for medical advice about side effects. You may report side effects to FDA at 1-800-FDA-1088. Where should I keep my medication? This medication is given in a hospital or clinic. It will not be stored at home. NOTE: This sheet is a summary. It may not cover all possible information. If you have questions about this medicine, talk to your doctor, pharmacist, or health care provider.  2024 [employer]vier/Gold Standard (2022-02-05 00:00:00)

## 2023-10-14 NOTE — Patient Instructions (Addendum)
 We can try Pilocarbine for the dry mouth. I've sent a prescription. It may take up to 3 months to notice benefit. If you experience sweating, headaches, fast heart rate, flushing, let me know as we may need to change medications.   You can try chewing/eating acidic or bitter substances (lime, lemon, peppermint, mustard, horseradish, wasabi, radish, etc.) to stimulate salivary flow. Sugar free candy may be less effective. Chewing sugarless gum can also help.   You can try the [facility] Cleansing Spray. It's a hypochlorous solution that you can use to scalp or areas of rash once a day.   It was a pleasure meeting you today and thank you for allowing me to participate in your care. -Tinnie Dawn, NP

## 2023-10-14 NOTE — Progress Notes (Signed)
 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  This was followed by a PET CT scan on 06/11/2023 which showed a 2.8 cm right level 2 lymph node with an SUV of 31.  Peripheral nodular hypermetabolism compatible with irregular rim enhancement.  12 mm nodular component along the inferior aspect of the lesion measuring SUV 10.1.  No other evidence of distant metastatic disease.  Core biopsy showed necrosis but was not conclusive for malignancy.  She was then referred to [facility] and underwent definitive excision of the right cervical lymph node on 07/17/2023.  Pathology showed malignant neoplasm with extensive necrosis consistent with metastatic melanoma.  BRAF negative surgical margins negative.  Right level 2A2B3 and 4 lymph nodes and right tonsil as well as right retromolar trigone biopsy all negative for malignancy. She was staged as stage IIIB T0 N1b M0.  She received adjuvant radiation therapy in [facility].  Pros and cons of adjuvant immunotherapy discussed with the patient and patient chose to receive care closer to home in [place].    Interval history- Patient is 83 year old female   ECOG PS- 1 Pain scale- 0   Review of systems- Review of Systems  Constitutional:  Negative for chills, fever, malaise/fatigue and weight loss.  HENT:  Negative for congestion, ear discharge and nosebleeds.    Eyes:  Negative for blurred vision.  Respiratory:  Negative for cough, hemoptysis, sputum production, shortness of breath and wheezing.   Cardiovascular:  Negative for chest pain, palpitations, orthopnea and claudication.  Gastrointestinal:  Negative for abdominal pain, blood in stool, constipation, diarrhea, heartburn, melena, nausea and vomiting.  Genitourinary:  Negative for dysuria, flank pain, frequency, hematuria and urgency.  Musculoskeletal:  Negative for back pain, joint pain and myalgias.  Skin:  Negative for rash.  Neurological:  Negative for dizziness, tingling, focal weakness, seizures, weakness and headaches.  Endo/Heme/Allergies:  Does not bruise/bleed easily.  Psychiatric/Behavioral:  Negative for depression and suicidal ideas. The patient does not have insomnia.       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   IR IMAGING GUIDED PORT INSERTION  08/21/2023   KNEE ARTHROSCOPY W/ MENISCAL REPAIR Left    PARTIAL KNEE ARTHROPLASTY Right 09/16/2017   PARTIAL KNEE  ARTHROPLASTY Right 09/16/2017   Procedure: RIGHT UNICOMPARTMENTAL KNEE;  Surgeon: Vernetta Lonni GRADE, MD;  Location[facility] OR;  Service: Orthopedics;  Laterality: Right;   SHOULDER ARTHROSCOPY Left 11/2018   TOTAL KNEE REVISION Right 06/15/2019   Procedure: RIGHT UNICOMPARTMENTAL ARTHROPLASTY TO TOTAL KNEE ARTHROPLASTY;  Surgeon: Vernetta Lonni GRADE, MD;  Location: [facility] OR;  Service:  Orthopedics;  Laterality: Right;   TUBAL LIGATION      Social History   Socioeconomic History   Marital status: Married    Spouse name: Reisha Wos   Number of children: 2   Years of education: Not on file   Highest education level: 12th grade  Occupational History   Occupation: Retired  Tobacco Use   Smoking status: Former    Current packs/day: 0.00    Types: Cigarettes    Quit date: 12/28/1986    Years since quitting: 36.8   Smokeless tobacco: Never  Vaping Use   Vaping status: Never Used  Substance and Sexual Activity   Alcohol use: No   Drug use: No   Sexual activity: Not Currently  Other Topics Concern   Not on file  Social History Narrative   Not on file   Social Drivers of Health   Financial Resource Strain: Low Risk  (08/22/2023)   Overall Financial Resource Strain (CARDIA)    Difficulty of Paying Living Expenses: Not hard at all  Food Insecurity: No Food Insecurity (08/22/2023)   Hunger Vital Sign    Worried About Running Out of Food in the Last Year: Never true    Ran Out of Food in the Last Year: Never true  Transportation Needs: No Transportation Needs (08/22/2023)   PRAPARE - Administrator, Civil Service (Medical): No    Lack of Transportation (Non-Medical): No  Physical Activity: Insufficiently Active (08/22/2023)   Exercise Vital Sign    Days of Exercise per Week: 4 days    Minutes of Exercise per Session: 30 min  Stress: No Stress Concern Present (08/22/2023)   [name]    Feeling of Stress : Not at all  Social Connections: Moderately Integrated (08/22/2023)   Social Connection and Isolation Panel [NHANES]    Frequency of Communication with Friends and Family: More than three times a week    Frequency of Social Gatherings with Friends and Family: Once a week    Attends Religious Services: More than 4 times per year    Active Member of [employer] or Organizations: No     Attends Banker Meetings: Never    Marital Status: Married  Catering Manager Violence: Not At Risk (08/22/2023)   Humiliation, Afraid, Rape, and Kick questionnaire    Fear of Current or Ex-Partner: No    Emotionally Abused: No    Physically Abused: No    Sexually Abused: No    Family History  Problem Relation Age of Onset   Breast cancer Neg Hx      Current Outpatient Medications:    aspirin  EC 81 MG tablet, Take 81 mg by mouth daily., Disp: , Rfl:    atorvastatin  (LIPITOR) 10 MG tablet, TAKE 1 TABLET BY MOUTH EVERY DAY, Disp: 90 tablet, Rfl: 1   Blood Glucose Monitoring Suppl DEVI, Use to check blood sugar twice a day.  DX R73.03. May substitute to any manufacturer covered by patient's insurance., Disp: 1 each, Rfl: 0   Calcium  Carb-Cholecalciferol  600-800 MG-UNIT TABS, Take 1 tablet by mouth 2 (two) times  daily. , Disp: , Rfl:    cetirizine (ZYRTEC) 10 MG tablet, Take 10 mg by mouth daily., Disp: , Rfl:    clotrimazole -betamethasone  (LOTRISONE ) cream, APPLY 1-2 TIMES A DAY AS NEEDED FOR SPOT TREATMENT FOR YEAST SKIN RASH, Disp: 30 g, Rfl: 1   esomeprazole  (NEXIUM ) 20 MG capsule, Take 1 capsule (20 mg total) by mouth 2 (two) times daily before a meal., Disp: 180 capsule, Rfl: 1   fluticasone  (FLONASE ) 50 MCG/ACT nasal spray, Place 2 sprays into the nose at bedtime. , Disp: , Rfl:    Glucosamine-Chondroitin (COSAMIN DS PO), Take 1 tablet by mouth 2 (two) times daily., Disp: , Rfl:    Lancets Misc. MISC, Use to check blood sugar twice a day.  DX R73.03. May substitute to any manufacturer covered by patient's insurance., Disp: 100 each, Rfl: 0   lidocaine -prilocaine  (EMLA ) cream, Apply to affected area once, Disp: 30 g, Rfl: 3   Multiple Vitamins-Minerals (MULTIVITAMIN WITH MINERALS) tablet, Take 1 tablet by mouth every evening. Women's One-A-Day, Disp: , Rfl:    nystatin  (MYCOSTATIN /NYSTOP ) powder, APPLY TOPICALLY 3 TIMES A DAY, Disp: 30 g, Rfl: 1   Omega-3 Fatty Acids (FISH  OIL) 1000 MG CAPS, Take 1,000 mg by mouth daily., Disp: , Rfl:    ondansetron  (ZOFRAN ) 8 MG tablet, Take 1 tablet (8 mg total) by mouth every 8 (eight) hours as needed for nausea or vomiting., Disp: 30 tablet, Rfl: 1   ONETOUCH VERIO test strip, USE TO CHECK BLOOD SUGAR TWICE A DAY. DX R73.03. MAY SUBSTITUTE TO ANY MANUFACTURER COVERED BY PATIENT'S INSURANCE., Disp: 100 strip, Rfl: 0   prochlorperazine  (COMPAZINE ) 10 MG tablet, Take 1 tablet (10 mg total) by mouth every 6 (six) hours as needed for nausea or vomiting., Disp: 30 tablet, Rfl: 1   TURMERIC PO, Take 538 mg by mouth daily., Disp: , Rfl:   Physical exam:  Vitals:   10/14/23 0850  BP: 137/63  Pulse: 72  Temp: (!) 96.4 F (35.8 C)  TempSrc: Tympanic  SpO2: 100%  Weight: 163 lb 3.2 oz (74 kg)   Physical Exam HENT:     Mouth/Throat:     Mouth: Mucous membranes are moist.     Pharynx: Oropharynx is clear.  Cardiovascular:     Rate and Rhythm: Normal rate and regular rhythm.     Heart sounds: Normal heart sounds.  Pulmonary:     Effort: Pulmonary effort is normal.  Skin:    General: Skin is warm and dry.  Neurological:     Mental Status: She is alert and oriented to person, place, and time.         Latest Ref Rng & Units 10/14/2023    8:34 AM  CMP  Glucose 70 - 99 mg/dL 884   BUN 8 - 23 mg/dL 20   Creatinine 9.55 - 1.00 mg/dL 9.40   Sodium 864 - 854 mmol/L 140   Potassium 3.5 - 5.1 mmol/L 3.8   Chloride 98 - 111 mmol/L 104   CO2 22 - 32 mmol/L 26   Calcium  8.9 - 10.3 mg/dL 9.1   Total Protein 6.5 - 8.1 g/dL 6.9   Total Bilirubin 0.0 - 1.2 mg/dL 0.7   Alkaline Phos 38 - 126 U/L 55   AST 15 - 41 U/L 22   ALT 0 - 44 U/L 18       Latest Ref Rng & Units 10/14/2023    8:34 AM  CBC  WBC 4.0 - 10.5 K/uL 6.3  Hemoglobin 12.0 - 15.0 g/dL 86.5   Hematocrit 63.9 - 46.0 % 40.2   Platelets 150 - 400 K/uL 162     Assessment and plan- Patient is a 83 y.o. female    History of stage IIIb malignant melanoma - T0 N1b M0.  Plan is to complete 1 year of adjuvant treatment.  Treatment assessment - Prior to cycle 3 of adjuvant Keytruda . Labs reviewed and acceptable for treatment. Tolerating well.  Xerostomia- post radiation. A systematic review of the literature identified nine randomized trials that studied pilocarpine  in the postradiation setting Isa SB, [place] AM, Vissink A, Leartis BRAVO, Brown CG, [place] AN, [place] JINNY Dave HARLAND, Marnie LITTIE Files NN, [place], [place], [place], [place], Bary DUKES, Nogueira-Rodrigues A, Zackary BIRCH, [place] B, von Ider I, Erick DS, Elting LS, [place] FK, Hershal MT, Salivary Gland Hypofunction/Xerostomia Section, Oral Care Study Group, Multinational Association of Supportive Care in Cancer (MASCC)/International Society of Oral Oncology (ISOO) Support Care Cancer. 2010;18(8):1061.). The review concluded that approximately 50 percent of patients with ??r??t?mi? benefit from oral ?il???r?i?? following RT. Continuous treatment with doses greater than 2.5 mg three times per day is required for optimal response, with some patients needing up to 12 weeks to experience benefit. Lifelong therapy is required. Pilocarpine  causes frequent side effects, most of which are the result of generalized sweating, headaches, tachycardia, hypertension, flushing, and increased bowel and bladder motility. Start pilocarbine 5 mg TID. If intolerance, could trial cevimeline. Acupuncture could also be considered. We also discussed nonpharmacologic stimuli such as acidic or bitter substances (lime, lemon, peppermint, mustard, horseradish, wasabi, radish, etc.) are effective at stimulating salivary flow. Sugar free candy may be less effective. Chewing sugarless gum can also help.  Rash- likely related to keytruda . Cosmetic. Not itchy. Grade 1. Continue treatment. She has a comedone on scalp. Can trial topical skin cleansing solution, dilute hypochlorous solution. Available at [facility].    Disposition:  Treatment today Rtc in 3 weeks for port/labs (cbc, cmp), Dr Melanee, +/- keytruda - la  Visit Diagnosis 1. Encounter for antineoplastic immunotherapy   2. Xerostomia due to radiotherapy   3. Drug rash    Tinnie Dawn, DNP, AGNP-C, [facility] at [facility] [phone] (clinic) 10/14/2023

## 2023-10-14 NOTE — Patient Instructions (Signed)

## 2023-10-15 LAB — T4: T4, Total: 7.9 ug/dL (ref 4.5–12.0)

## 2023-11-04 ENCOUNTER — Inpatient Hospital Stay: Payer: PPO

## 2023-11-04 ENCOUNTER — Encounter: Payer: Self-pay | Admitting: Oncology

## 2023-11-04 ENCOUNTER — Inpatient Hospital Stay (HOSPITAL_BASED_OUTPATIENT_CLINIC_OR_DEPARTMENT_OTHER): Payer: PPO

## 2023-11-04 ENCOUNTER — Inpatient Hospital Stay: Payer: PPO | Admitting: Oncology

## 2023-11-04 VITALS — BP 156/60 | HR 75

## 2023-11-04 VITALS — BP 138/56 | HR 75 | Temp 97.7°F | Resp 20 | Wt 155.9 lb

## 2023-11-04 DIAGNOSIS — L270 Generalized skin eruption due to drugs and medicaments taken internally: Secondary | ICD-10-CM

## 2023-11-04 DIAGNOSIS — C439 Malignant melanoma of skin, unspecified: Secondary | ICD-10-CM

## 2023-11-04 DIAGNOSIS — Z5112 Encounter for antineoplastic immunotherapy: Secondary | ICD-10-CM

## 2023-11-04 LAB — CMP (CANCER CENTER ONLY)
ALT: 19 U/L (ref 0–44)
AST: 24 U/L (ref 15–41)
Albumin: 4 g/dL (ref 3.5–5.0)
Alkaline Phosphatase: 56 U/L (ref 38–126)
Anion gap: 9 (ref 5–15)
BUN: 16 mg/dL (ref 8–23)
CO2: 23 mmol/L (ref 22–32)
Calcium: 9 mg/dL (ref 8.9–10.3)
Chloride: 105 mmol/L (ref 98–111)
Creatinine: 0.59 mg/dL (ref 0.44–1.00)
GFR, Estimated: >60 mL/min (ref >60)
Glucose, Bld: 106 mg/dL — ABNORMAL HIGH (ref 70–99)
Potassium: 3.9 mmol/L (ref 3.5–5.1)
Sodium: 137 mmol/L (ref 135–145)
Total Bilirubin: 0.8 mg/dL (ref 0.0–1.2)
Total Protein: 7.3 g/dL (ref 6.5–8.1)

## 2023-11-04 LAB — CBC WITH DIFFERENTIAL (CANCER CENTER ONLY)
Abs Immature Granulocytes: 0.01 10*3/uL (ref 0.00–0.07)
Basophils Absolute: 0 10*3/uL (ref 0.0–0.1)
Basophils Relative: 1 %
Eosinophils Absolute: 0.2 10*3/uL (ref 0.0–0.5)
Eosinophils Relative: 3 %
HCT: 40.5 % (ref 36.0–46.0)
Hemoglobin: 13.4 g/dL (ref 12.0–15.0)
Immature Granulocytes: 0 %
Lymphocytes Relative: 34 %
Lymphs Abs: 2 10*3/uL (ref 0.7–4.0)
MCH: 30.1 pg (ref 26.0–34.0)
MCHC: 33.1 g/dL (ref 30.0–36.0)
MCV: 91 fL (ref 80.0–100.0)
Monocytes Absolute: 0.6 10*3/uL (ref 0.1–1.0)
Monocytes Relative: 11 %
Neutro Abs: 3 10*3/uL (ref 1.7–7.7)
Neutrophils Relative %: 51 %
Platelet Count: 169 10*3/uL (ref 150–400)
RBC: 4.45 MIL/uL (ref 3.87–5.11)
RDW: 12.4 % (ref 11.5–15.5)
WBC Count: 5.9 10*3/uL (ref 4.0–10.5)
nRBC: 0 % (ref 0.0–0.2)

## 2023-11-04 MED ORDER — HEPARIN SOD (PORK) LOCK FLUSH 100 UNIT/ML IV SOLN
500.0000 [IU] | Freq: Once | INTRAVENOUS | Status: AC | PRN
  Administered 2023-11-04: 500 [IU]
  Filled 2023-11-04: qty 5

## 2023-11-04 MED ORDER — SODIUM CHLORIDE 0.9 % IV SOLN
INTRAVENOUS | Status: DC
  Filled 2023-11-04: qty 250

## 2023-11-04 MED ORDER — SODIUM CHLORIDE 0.9% FLUSH
10.0000 mL | INTRAVENOUS | Status: DC | PRN
Start: 2023-11-04 — End: 2023-11-04
  Administered 2023-11-04: 10 mL
  Filled 2023-11-04: qty 10

## 2023-11-04 MED ORDER — SODIUM CHLORIDE 0.9 % IV SOLN
200.0000 mg | Freq: Once | INTRAVENOUS | Status: AC
  Administered 2023-11-04: 200 mg via INTRAVENOUS
  Filled 2023-11-04: qty 200

## 2023-11-04 NOTE — Progress Notes (Signed)
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  This was followed by a PET CT scan on 06/11/2023 which showed a 2.8 cm right level 2 lymph node with an SUV of 31.  Peripheral nodular hypermetabolism compatible with irregular rim enhancement.  12 mm nodular component along the inferior aspect of the lesion measuring SUV 10.1.  No other evidence of distant metastatic disease.  Core biopsy showed necrosis but was not conclusive for malignancy.  She was then referred to [facility] and underwent definitive excision of the right cervical lymph node on 07/17/2023.  Pathology showed malignant neoplasm with extensive necrosis consistent with metastatic melanoma.  BRAF negative surgical margins negative.  Right level 2A2B3 and 4 lymph nodes and right tonsil as well as right retromolar trigone biopsy all negative for malignancy. She was staged as stage IIIB T0 N1b M0.  She received adjuvant radiation therapy in [facility].  Pros and cons of adjuvant immunotherapy discussed with the patient and patient chose to receive care closer to home in [place].  Interval history-she has developed an erythematous rash over her bilateral forearms and bilateral legs.  Rash is overall nonpruritic and it does not bother her.States that she was prescribed topical lotion by [facility] but it makes her skin very dry and therefore she  has not been using it.  She is trying to keep her skin moisturized with Aveeno  ECOG PS- 1 Pain scale- 0   Review of systems- Review of Systems  Constitutional:  Negative for chills, fever, malaise/fatigue and weight loss.  HENT:  Negative for congestion, ear discharge and nosebleeds.   Eyes:  Negative for blurred vision.  Respiratory:  Negative for cough, hemoptysis, sputum production, shortness of breath and wheezing.   Cardiovascular:  Negative for chest pain, palpitations, orthopnea and claudication.  Gastrointestinal:  Negative for abdominal pain, blood in stool, constipation, diarrhea, heartburn, melena, nausea and vomiting.  Genitourinary:  Negative for dysuria, flank pain, frequency, hematuria and urgency.  Musculoskeletal:  Negative for back pain, joint pain and myalgias.  Skin:  Positive for rash.  Neurological:  Negative for dizziness, tingling, focal weakness, seizures, weakness and headaches.  Endo/Heme/Allergies:  Does not bruise/bleed easily.  Psychiatric/Behavioral:  Negative for depression and suicidal ideas. The patient does not have insomnia.       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   IR IMAGING GUIDED PORT INSERTION  08/21/2023   KNEE ARTHROSCOPY W/ MENISCAL REPAIR Left    PARTIAL KNEE ARTHROPLASTY Right 09/16/2017   PARTIAL KNEE ARTHROPLASTY Right 09/16/2017   Procedure: RIGHT UNICOMPARTMENTAL KNEE;  Surgeon: Kathryne Hitch, MD;  Location: [facility] OR;  Service: Orthopedics;  Laterality: Right;   SHOULDER ARTHROSCOPY Left 11/2018   TOTAL KNEE REVISION Right 06/15/2019   Procedure: RIGHT UNICOMPARTMENTAL ARTHROPLASTY TO TOTAL KNEE ARTHROPLASTY;  Surgeon: Kathryne Hitch, MD;  Location: [facility] OR;  Service: Orthopedics;  Laterality: Right;   TUBAL LIGATION      Social History   Socioeconomic History   Marital status: Married    Spouse name: Mata Rowen   Number of children: 2   Years of education: Not on file   Highest education level: 12th grade  Occupational History   Occupation: Retired  Tobacco Use   Smoking status: Former    Current packs/day: 0.00    Types: Cigarettes    Quit date: 12/28/1986    Years since quitting: 36.8   Smokeless tobacco: Never  Vaping Use   Vaping status: Never Used  Substance and Sexual Activity   Alcohol use: No   Drug use: No   Sexual activity: Not Currently  Other Topics Concern   Not on file  Social History Narrative   Not on file   Social Drivers of Health   Financial Resource Strain: Low Risk  (08/22/2023)   Overall Financial Resource Strain (CARDIA)    Difficulty of Paying Living Expenses: Not hard at all  Food Insecurity: No Food Insecurity (08/22/2023)   Hunger Vital Sign    Worried About Running Out of Food in the Last Year: Never true    Ran Out of Food in the Last Year: Never true  Transportation Needs: No Transportation Needs (08/22/2023)   PRAPARE - Administrator, Civil Service (Medical): No    Lack of Transportation (Non-Medical): No  Physical Activity: Insufficiently Active (08/22/2023)   Exercise Vital Sign    Days of Exercise per Week: 4 days    Minutes of Exercise per Session: 30 min  Stress: No Stress Concern Present (08/22/2023)   [name]    Feeling of Stress : Not at all  Social Connections: Moderately Integrated (08/22/2023)   Social  Connection and Isolation Panel [NHANES]    Frequency of Communication with Friends and Family: More than three times a week    Frequency of Social Gatherings with Friends and Family: Once a week    Attends Religious Services: More than 4 times per year    Active Member of [employer] or Organizations: No    Attends Banker Meetings: Never    Marital Status: Married  Catering manager Violence: Not At Risk (08/22/2023)   Humiliation, Afraid, Rape, and Kick questionnaire    Fear of Current or Ex-Partner: No    Emotionally Abused: No    Physically Abused: No    Sexually Abused: No    Family History  Problem Relation Age of Onset   Breast cancer Neg Hx      Current Outpatient Medications:    aspirin EC 81 MG tablet, Take 81 mg by mouth daily., Disp: , Rfl:    atorvastatin (LIPITOR) 10 MG tablet, TAKE 1 TABLET BY MOUTH EVERY DAY, Disp: 90 tablet,  Rfl: 1   Blood Glucose Monitoring Suppl DEVI, Use to check blood sugar twice a day.  DX R73.03. May substitute to any manufacturer covered by patient's insurance., Disp: 1 each, Rfl: 0   Calcium Carb-Cholecalciferol 600-800 MG-UNIT TABS, Take 1 tablet by mouth 2 (two) times daily. , Disp: , Rfl:    cetirizine (ZYRTEC) 10 MG tablet, Take 10 mg by mouth daily., Disp: , Rfl:    clotrimazole-betamethasone (LOTRISONE) cream, APPLY 1-2 TIMES A DAY AS NEEDED FOR SPOT TREATMENT FOR YEAST SKIN RASH, Disp: 30 g, Rfl: 1   esomeprazole (NEXIUM) 20 MG capsule, Take 1 capsule (20 mg total) by mouth 2 (two) times daily before a meal., Disp: 180 capsule, Rfl: 1   fluticasone (FLONASE) 50 MCG/ACT nasal spray, Place 2 sprays into the nose at bedtime. , Disp: , Rfl:    Glucosamine-Chondroitin (COSAMIN DS PO), Take 1 tablet by mouth 2 (two) times daily., Disp: , Rfl:    Lancets Misc. MISC, Use to check blood sugar twice a day.  DX R73.03. May substitute to any manufacturer covered by patient's insurance., Disp: 100 each, Rfl: 0   lidocaine-prilocaine (EMLA)  cream, Apply to affected area once, Disp: 30 g, Rfl: 3   Multiple Vitamins-Minerals (MULTIVITAMIN WITH MINERALS) tablet, Take 1 tablet by mouth every evening. Women's One-A-Day, Disp: , Rfl:    nystatin (MYCOSTATIN/NYSTOP) powder, APPLY TOPICALLY 3 TIMES A DAY, Disp: 30 g, Rfl: 1   Omega-3 Fatty Acids (FISH OIL) 1000 MG CAPS, Take 1,000 mg by mouth daily., Disp: , Rfl:    ondansetron (ZOFRAN) 8 MG tablet, Take 1 tablet (8 mg total) by mouth every 8 (eight) hours as needed for nausea or vomiting., Disp: 30 tablet, Rfl: 1   ONETOUCH VERIO test strip, USE TO CHECK BLOOD SUGAR TWICE A DAY. DX R73.03. MAY SUBSTITUTE TO ANY MANUFACTURER COVERED BY PATIENT'S INSURANCE., Disp: 100 strip, Rfl: 0   pilocarpine (SALAGEN) 5 MG tablet, Take 1 tablet (5 mg total) by mouth 3 (three) times daily. For dry mouth from radiation, Disp: 90 tablet, Rfl: 1   prochlorperazine (COMPAZINE) 10 MG tablet, Take 1 tablet (10 mg total) by mouth every 6 (six) hours as needed for nausea or vomiting., Disp: 30 tablet, Rfl: 1   TURMERIC PO, Take 538 mg by mouth daily., Disp: , Rfl:   Physical exam:  Vitals:   11/04/23 0834  BP: (!) 138/56  Pulse: 75  Resp: 20  Temp: 97.7 F (36.5 C)  SpO2: 97%  Weight: 155 lb 14.4 oz (70.7 kg)   Physical Exam Cardiovascular:     Rate and Rhythm: Normal rate and regular rhythm.     Heart sounds: Normal heart sounds.  Pulmonary:     Effort: Pulmonary effort is normal.     Breath sounds: Normal breath sounds.  Skin:    Comments: Erythematous maculopapular rash noted over bilateral forearms and legs  Neurological:     Mental Status: She is alert and oriented to person, place, and time.         Latest Ref Rng & Units 11/04/2023    8:21 AM  CMP  Glucose 70 - 99 mg/dL 811   BUN 8 - 23 mg/dL 16   Creatinine 9.14 - 1.00 mg/dL 7.82   Sodium 956 - 213 mmol/L 137   Potassium 3.5 - 5.1 mmol/L 3.9   Chloride 98 - 111 mmol/L 105   CO2 22 - 32 mmol/L 23   Calcium 8.9 - 10.3 mg/dL 9.0  Total Protein 6.5 - 8.1 g/dL 7.3   Total Bilirubin 0.0 - 1.2 mg/dL 0.8   Alkaline Phos 38 - 126 U/L 56   AST 15 - 41 U/L 24   ALT 0 - 44 U/L 19       Latest Ref Rng & Units 11/04/2023    8:21 AM  CBC  WBC 4.0 - 10.5 K/uL 5.9   Hemoglobin 12.0 - 15.0 g/dL 40.9   Hematocrit 81.1 - 46.0 % 40.5   Platelets 150 - 400 K/uL 169     No images are attached to the encounter.  MM 3D SCREENING MAMMOGRAM BILATERAL BREAST Result Date: 10/14/2023 CLINICAL DATA:  Screening. EXAM: DIGITAL SCREENING BILATERAL MAMMOGRAM WITH TOMOSYNTHESIS AND CAD TECHNIQUE: Bilateral screening digital craniocaudal and mediolateral oblique mammograms were obtained. Bilateral screening digital breast tomosynthesis was performed. The images were evaluated with computer-aided detection. COMPARISON:  Previous exam(s). ACR Breast Density Category b: There are scattered areas of fibroglandular density. FINDINGS: There are no findings suspicious for malignancy. IMPRESSION: No mammographic evidence of malignancy. A result letter of this screening mammogram will be mailed directly to the patient. RECOMMENDATION: Screening mammogram in one year. (Code:SM-B-01Y) BI-RADS CATEGORY  1: Negative. Electronically Signed   By: Sherron Ales M.D.   On: 10/14/2023 09:47     Assessment and plan- Patient is a 83 y.o. female with history of stage IIIb malignant melanoma T0 N1b M0 here for on treatment assessment prior to cycle 4 of adjuvant Keytruda   Counts okay to proceed with cycle 4 of Keytruda today and I will see her back in 3 weeks for cycle 5.  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  She has been keeping her skin moisturized overall the rash does not bother her.  We discussed topical versus oral steroids should the rash worsen.  We will continue to monitor it conservatively for now    Visit Diagnosis 1. Melanoma of skin (HCC)   2. Encounter for  antineoplastic immunotherapy   3. Drug-induced skin rash      Dr. Owens Shark, MD, MPH [facility] at [facility] [phone] 11/04/2023 8:53 AM

## 2023-11-04 NOTE — Patient Instructions (Signed)
[facility] - A DEPT OF [facility]  Discharge Instructions: Thank you for choosing [facility] to provide your oncology and hematology care.  If you have a lab appointment with the Cancer Center, please go directly to the Cancer Center and check in at the registration area.  Wear comfortable clothing and clothing appropriate for easy access to any Portacath or PICC line.   We strive to give you quality time with your provider. You may need to reschedule your appointment if you arrive late (15 or more minutes).  Arriving late affects you and other patients whose appointments are after yours.  Also, if you miss three or more appointments without notifying the office, you may be dismissed from the clinic at the provider's discretion.      For prescription refill requests, have your pharmacy contact our office and allow 72 hours for refills to be completed.    Today you received the following chemotherapy and/or immunotherapy agents- Keytruda      To help prevent nausea and vomiting after your treatment, we encourage you to take your nausea medication as directed.  BELOW ARE SYMPTOMS THAT SHOULD BE REPORTED IMMEDIATELY: *FEVER GREATER THAN 100.4 F (38 C) OR HIGHER *CHILLS OR SWEATING *NAUSEA AND VOMITING THAT IS NOT CONTROLLED WITH YOUR NAUSEA MEDICATION *UNUSUAL SHORTNESS OF BREATH *UNUSUAL BRUISING OR BLEEDING *URINARY PROBLEMS (pain or burning when urinating, or frequent urination) *BOWEL PROBLEMS (unusual diarrhea, constipation, pain near the anus) TENDERNESS IN MOUTH AND THROAT WITH OR WITHOUT PRESENCE OF ULCERS (sore throat, sores in mouth, or a toothache) UNUSUAL RASH, SWELLING OR PAIN  UNUSUAL VAGINAL DISCHARGE OR ITCHING   Items with * indicate a potential emergency and should be followed up as soon as possible or go to the Emergency Department if any problems should occur.  Please show the CHEMOTHERAPY ALERT CARD or IMMUNOTHERAPY  ALERT CARD at check-in to the Emergency Department and triage nurse.  Should you have questions after your visit or need to cancel or reschedule your appointment, please contact [facility] - A DEPT OF [facility]  [phone] and follow the prompts.  Office hours are 8:00 a.m. to 4:30 p.m. Monday - Friday. Please note that voicemails left after 4:00 p.m. may not be returned until the following business day.  We are closed weekends and major holidays. You have access to a nurse at all times for urgent questions. Please call the main number to the clinic [phone] and follow the prompts.  For any non-urgent questions, you may also contact your provider using MyChart. We now offer e-Visits for anyone 55 and older to request care online for non-urgent symptoms. For details visit [email or web address].   Also download the MyChart app! Go to the app store, search "MyChart", open the app, select [place], and log in with your MyChart username and password.

## 2023-11-07 ENCOUNTER — Other Ambulatory Visit: Payer: Self-pay | Admitting: Nurse Practitioner

## 2023-11-07 ENCOUNTER — Other Ambulatory Visit: Payer: Self-pay | Admitting: Internal Medicine

## 2023-11-07 NOTE — Telephone Encounter (Signed)
Requested Prescriptions  Pending Prescriptions Disp Refills   glucose blood (ONETOUCH VERIO) test strip [Pharmacy Med Name: ONE TOUCH VERIO TEST STRIP] 100 strip 1    Sig: USE TO CHECK BLOOD SUGAR TWICE A DAY. DX R73.03.     Endocrinology: Diabetes - Testing Supplies Passed - 11/07/2023  5:51 PM      Passed - Valid encounter within last 12 months    Recent Outpatient Visits           1 month ago Encounter for general adult medical examination with abnormal findings   [facility] [place], Salvadore Oxford, NP   5 months ago Mass of right side of neck   [facility] [place], Salvadore Oxford, NP   7 months ago Prediabetes   [place] [facility] [place], Salvadore Oxford, NP   10 months ago Candidal intertrigo   [facility] Smitty Cords, DO   1 year ago Encounter for general adult medical examination with abnormal findings   [facility] [place], Salvadore Oxford, NP       Future Appointments             In 4 months Baity, Salvadore Oxford, NP [facility]

## 2023-11-10 ENCOUNTER — Encounter: Payer: Self-pay | Admitting: Oncology

## 2023-11-12 DIAGNOSIS — L820 Inflamed seborrheic keratosis: Secondary | ICD-10-CM | POA: Diagnosis not present

## 2023-11-12 DIAGNOSIS — L538 Other specified erythematous conditions: Secondary | ICD-10-CM | POA: Diagnosis not present

## 2023-11-12 DIAGNOSIS — R233 Spontaneous ecchymoses: Secondary | ICD-10-CM | POA: Diagnosis not present

## 2023-11-25 ENCOUNTER — Inpatient Hospital Stay: Payer: PPO | Attending: Oncology | Admitting: Oncology

## 2023-11-25 ENCOUNTER — Encounter: Payer: Self-pay | Admitting: Oncology

## 2023-11-25 ENCOUNTER — Inpatient Hospital Stay: Payer: PPO

## 2023-11-25 VITALS — BP 125/60 | HR 75 | Temp 98.3°F | Resp 17 | Wt 163.0 lb

## 2023-11-25 DIAGNOSIS — C770 Secondary and unspecified malignant neoplasm of lymph nodes of head, face and neck: Secondary | ICD-10-CM | POA: Diagnosis not present

## 2023-11-25 DIAGNOSIS — C439 Malignant melanoma of skin, unspecified: Secondary | ICD-10-CM | POA: Diagnosis not present

## 2023-11-25 DIAGNOSIS — Z79899 Other long term (current) drug therapy: Secondary | ICD-10-CM | POA: Diagnosis not present

## 2023-11-25 DIAGNOSIS — L270 Generalized skin eruption due to drugs and medicaments taken internally: Secondary | ICD-10-CM | POA: Diagnosis not present

## 2023-11-25 DIAGNOSIS — Z5112 Encounter for antineoplastic immunotherapy: Secondary | ICD-10-CM | POA: Diagnosis not present

## 2023-11-25 DIAGNOSIS — Z85820 Personal history of malignant melanoma of skin: Secondary | ICD-10-CM | POA: Diagnosis not present

## 2023-11-25 LAB — CMP (CANCER CENTER ONLY)
ALT: 18 U/L (ref 0–44)
AST: 24 U/L (ref 15–41)
Albumin: 4 g/dL (ref 3.5–5.0)
Alkaline Phosphatase: 62 U/L (ref 38–126)
Anion gap: 12 (ref 5–15)
BUN: 18 mg/dL (ref 8–23)
CO2: 22 mmol/L (ref 22–32)
Calcium: 9 mg/dL (ref 8.9–10.3)
Chloride: 103 mmol/L (ref 98–111)
Creatinine: 0.6 mg/dL (ref 0.44–1.00)
GFR, Estimated: >60 mL/min (ref >60)
Glucose, Bld: 104 mg/dL — ABNORMAL HIGH (ref 70–99)
Potassium: 3.9 mmol/L (ref 3.5–5.1)
Sodium: 137 mmol/L (ref 135–145)
Total Bilirubin: 0.7 mg/dL (ref 0.0–1.2)
Total Protein: 7.3 g/dL (ref 6.5–8.1)

## 2023-11-25 LAB — CBC WITH DIFFERENTIAL (CANCER CENTER ONLY)
Abs Immature Granulocytes: 0.01 10*3/uL (ref 0.00–0.07)
Basophils Absolute: 0 10*3/uL (ref 0.0–0.1)
Basophils Relative: 1 %
Eosinophils Absolute: 0.2 10*3/uL (ref 0.0–0.5)
Eosinophils Relative: 3 %
HCT: 43.2 % (ref 36.0–46.0)
Hemoglobin: 14 g/dL (ref 12.0–15.0)
Immature Granulocytes: 0 %
Lymphocytes Relative: 34 %
Lymphs Abs: 2.1 10*3/uL (ref 0.7–4.0)
MCH: 29.7 pg (ref 26.0–34.0)
MCHC: 32.4 g/dL (ref 30.0–36.0)
MCV: 91.5 fL (ref 80.0–100.0)
Monocytes Absolute: 0.6 10*3/uL (ref 0.1–1.0)
Monocytes Relative: 10 %
Neutro Abs: 3.2 10*3/uL (ref 1.7–7.7)
Neutrophils Relative %: 52 %
Platelet Count: 180 10*3/uL (ref 150–400)
RBC: 4.72 MIL/uL (ref 3.87–5.11)
RDW: 12.5 % (ref 11.5–15.5)
WBC Count: 6.1 10*3/uL (ref 4.0–10.5)
nRBC: 0 % (ref 0.0–0.2)

## 2023-11-25 MED ORDER — HEPARIN SOD (PORK) LOCK FLUSH 100 UNIT/ML IV SOLN
500.0000 [IU] | Freq: Once | INTRAVENOUS | Status: DC | PRN
  Filled 2023-11-25: qty 5

## 2023-11-25 MED ORDER — SODIUM CHLORIDE 0.9 % IV SOLN
INTRAVENOUS | Status: DC
Start: 2023-11-25 — End: 2023-11-25
  Filled 2023-11-25: qty 250

## 2023-11-25 MED ORDER — SODIUM CHLORIDE 0.9 % IV SOLN
200.0000 mg | Freq: Once | INTRAVENOUS | Status: AC
  Administered 2023-11-25: 200 mg via INTRAVENOUS
  Filled 2023-11-25: qty 8

## 2023-11-25 NOTE — Patient Instructions (Signed)
 [facility]  Discharge Instructions: Thank you for choosing [facility] to provide your oncology and hematology care.  If you have a lab appointment with the Cancer Center, please go directly to the Cancer Center and check in at the registration area.  Wear comfortable clothing and clothing appropriate for easy access to any Portacath or PICC line.   We strive to give you quality time with your provider. You may need to reschedule your appointment if you arrive late (15 or more minutes).  Arriving late affects you and other patients whose appointments are after yours.  Also, if you miss three or more appointments without notifying the office, you may be dismissed from the clinic at the provider's discretion.      For prescription refill requests, have your pharmacy contact our office and allow 72 hours for refills to be completed.    Today you received the following chemotherapy and/or immunotherapy agents Keytruda      To help prevent nausea and vomiting after your treatment, we encourage you to take your nausea medication as directed.  BELOW ARE SYMPTOMS THAT SHOULD BE REPORTED IMMEDIATELY: *FEVER GREATER THAN 100.4 F (38 C) OR HIGHER *CHILLS OR SWEATING *NAUSEA AND VOMITING THAT IS NOT CONTROLLED WITH YOUR NAUSEA MEDICATION *UNUSUAL SHORTNESS OF BREATH *UNUSUAL BRUISING OR BLEEDING *URINARY PROBLEMS (pain or burning when urinating, or frequent urination) *BOWEL PROBLEMS (unusual diarrhea, constipation, pain near the anus) TENDERNESS IN MOUTH AND THROAT WITH OR WITHOUT PRESENCE OF ULCERS (sore throat, sores in mouth, or a toothache) UNUSUAL RASH, SWELLING OR PAIN  UNUSUAL VAGINAL DISCHARGE OR ITCHING   Items with * indicate a potential emergency and should be followed up as soon as possible or go to the Emergency Department if any problems should occur.  Please show the CHEMOTHERAPY ALERT CARD or IMMUNOTHERAPY  ALERT CARD at check-in to the Emergency Department and triage nurse.  Should you have questions after your visit or need to cancel or reschedule your appointment, please contact [facility] - A DEPT OF [facility]  [phone] and follow the prompts.  Office hours are 8:00 a.m. to 4:30 p.m. Monday - Friday. Please note that voicemails left after 4:00 p.m. may not be returned until the following business day.  We are closed weekends and major holidays. You have access to a nurse at all times for urgent questions. Please call the main number to the clinic [phone] and follow the prompts.  For any non-urgent questions, you may also contact your provider using MyChart. We now offer e-Visits for anyone 58 and older to request care online for non-urgent symptoms. For details visit [email or web address].   Also download the MyChart app! Go to the app store, search "MyChart", open the app, select [place], and log in with your MyChart username and password.

## 2023-11-25 NOTE — Progress Notes (Signed)
 Patient here for oncology follow-up appointment, expresses no complaints or concerns at this time.

## 2023-11-25 NOTE — Progress Notes (Signed)
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  This was followed by a PET CT scan on 06/11/2023 which showed a 2.8 cm right level 2 lymph node with an SUV of 31.  Peripheral nodular hypermetabolism compatible with irregular rim enhancement.  12 mm nodular component along the inferior aspect of the lesion measuring SUV 10.1.  No other evidence of distant metastatic disease.  Core biopsy showed necrosis but was not conclusive for malignancy.  She was then referred to [facility] and underwent definitive excision of the right cervical lymph node on 07/17/2023.  Pathology showed malignant neoplasm with extensive necrosis consistent with metastatic melanoma.  BRAF negative surgical margins negative.  Right level 2A2B3 and 4 lymph nodes and right tonsil as well as right retromolar trigone biopsy all negative for malignancy. She was staged as stage IIIB T0 N1b M0.  She received adjuvant radiation therapy in [facility]  Pros and cons of adjuvant immunotherapy discussed with the patient and patient chose to receive care closer to home in [place].  Interval history-patient is tolerating Keytruda well other than maculopapular rash which is mainly noticed on her forearms and legs.  She has been using Aveeno cream as a soothing agent.  The rash does not bother her as such and has been nonpruritic.  ECOG PS-  1 Pain scale- 0  Review of systems- Review of Systems  Constitutional:  Negative for chills, fever, malaise/fatigue and weight loss.  HENT:  Negative for congestion, ear discharge and nosebleeds.   Eyes:  Negative for blurred vision.  Respiratory:  Negative for cough, hemoptysis, sputum production, shortness of breath and wheezing.   Cardiovascular:  Negative for chest pain, palpitations, orthopnea and claudication.  Gastrointestinal:  Negative for abdominal pain, blood in stool, constipation, diarrhea, heartburn, melena, nausea and vomiting.  Genitourinary:  Negative for dysuria, flank pain, frequency, hematuria and urgency.  Musculoskeletal:  Negative for back pain, joint pain and myalgias.  Skin:  Negative for rash.  Neurological:  Negative for dizziness, tingling, focal weakness, seizures, weakness and headaches.  Endo/Heme/Allergies:  Does not bruise/bleed easily.  Psychiatric/Behavioral:  Negative for depression and suicidal ideas. The patient does not have insomnia.       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   IR IMAGING GUIDED PORT INSERTION  08/21/2023   KNEE ARTHROSCOPY W/ MENISCAL REPAIR Left    PARTIAL KNEE ARTHROPLASTY Right 09/16/2017   PARTIAL KNEE ARTHROPLASTY Right 09/16/2017   Procedure: RIGHT UNICOMPARTMENTAL KNEE;  Surgeon: Kathryne Hitch, MD;  Location: [facility] OR;  Service: Orthopedics;  Laterality: Right;   SHOULDER ARTHROSCOPY  Left 11/2018   TOTAL KNEE REVISION Right 06/15/2019   Procedure: RIGHT UNICOMPARTMENTAL ARTHROPLASTY TO TOTAL KNEE ARTHROPLASTY;  Surgeon: Kathryne Hitch, MD;  Location: [facility] OR;  Service: Orthopedics;  Laterality: Right;   TUBAL LIGATION      Social History   Socioeconomic History   Marital status: Married    Spouse name: Jaydynn Wolford   Number of children: 2   Years of education: Not on file   Highest education level: 12th grade  Occupational History   Occupation: Retired  Tobacco Use   Smoking status: Former    Current packs/day: 0.00    Types: Cigarettes    Quit date: 12/28/1986    Years since quitting: 36.9   Smokeless tobacco: Never  Vaping Use   Vaping status: Never Used  Substance and Sexual Activity   Alcohol use: No   Drug use: No   Sexual activity: Not Currently  Other Topics Concern   Not on file  Social History Narrative   Not on file   Social Drivers of Health   Financial Resource Strain: Low Risk  (08/22/2023)   Overall Financial Resource Strain (CARDIA)    Difficulty of Paying Living Expenses: Not hard at all  Food Insecurity: No Food Insecurity (08/22/2023)   Hunger Vital Sign    Worried About Running Out of Food in the Last Year: Never true    Ran Out of Food in the Last Year: Never true  Transportation Needs: No Transportation Needs (08/22/2023)   PRAPARE - Administrator, Civil Service (Medical): No    Lack of Transportation (Non-Medical): No  Physical Activity: Insufficiently Active (08/22/2023)   Exercise Vital Sign    Days of Exercise per Week: 4 days    Minutes of Exercise per Session: 30 min  Stress: No Stress Concern Present (08/22/2023)   [name]    Feeling of Stress : Not at all  Social Connections: Moderately Integrated (08/22/2023)   Social Connection and Isolation Panel [NHANES]    Frequency of Communication with Friends and Family: More than three  times a week    Frequency of Social Gatherings with Friends and Family: Once a week    Attends Religious Services: More than 4 times per year    Active Member of [employer] or Organizations: No    Attends Banker Meetings: Never    Marital Status: Married  Catering manager Violence: Not At Risk (08/22/2023)   Humiliation, Afraid, Rape, and Kick questionnaire    Fear of Current or Ex-Partner: No    Emotionally Abused: No    Physically Abused: No    Sexually Abused: No    Family History  Problem Relation Age of Onset   Breast cancer Neg Hx      Current Outpatient Medications:    acetaminophen (TYLENOL) 325 MG tablet, Take by mouth., Disp: , Rfl:    aspirin EC 81 MG tablet, Take 81 mg by mouth daily., Disp: , Rfl:    atorvastatin (LIPITOR) 10 MG tablet, TAKE 1 TABLET BY MOUTH EVERY DAY, Disp: 90 tablet, Rfl: 1  Blood Glucose Monitoring Suppl DEVI, Use to check blood sugar twice a day.  DX R73.03. May substitute to any manufacturer covered by patient's insurance., Disp: 1 each, Rfl: 0   Calcium Carb-Cholecalciferol 600-800 MG-UNIT TABS, Take 1 tablet by mouth 2 (two) times daily. , Disp: , Rfl:    cetirizine (ZYRTEC) 10 MG tablet, Take 10 mg by mouth daily., Disp: , Rfl:    clotrimazole-betamethasone (LOTRISONE) cream, APPLY 1-2 TIMES A DAY AS NEEDED FOR SPOT TREATMENT FOR YEAST SKIN RASH, Disp: 30 g, Rfl: 1   esomeprazole (NEXIUM) 20 MG capsule, Take 1 capsule (20 mg total) by mouth 2 (two) times daily before a meal., Disp: 180 capsule, Rfl: 1   fluticasone (FLONASE) 50 MCG/ACT nasal spray, Place 2 sprays into the nose at bedtime. , Disp: , Rfl:    Glucosamine-Chondroitin (COSAMIN DS PO), Take 1 tablet by mouth 2 (two) times daily., Disp: , Rfl:    glucose blood (ONETOUCH VERIO) test strip, USE TO CHECK BLOOD SUGAR TWICE A DAY. DX R73.03., Disp: 100 strip, Rfl: 1   Lancets Misc. MISC, Use to check blood sugar twice a day.  DX R73.03. May substitute to any manufacturer covered by  patient's insurance., Disp: 100 each, Rfl: 0   lidocaine-prilocaine (EMLA) cream, Apply to affected area once, Disp: 30 g, Rfl: 3   Multiple Vitamins-Minerals (MULTIVITAMIN WITH MINERALS) tablet, Take 1 tablet by mouth every evening. Women's One-A-Day, Disp: , Rfl:    nystatin (MYCOSTATIN/NYSTOP) powder, APPLY TOPICALLY 3 TIMES A DAY, Disp: 30 g, Rfl: 1   Omega-3 Fatty Acids (FISH OIL) 1000 MG CAPS, Take 1,000 mg by mouth daily., Disp: , Rfl:    ondansetron (ZOFRAN) 8 MG tablet, Take 1 tablet (8 mg total) by mouth every 8 (eight) hours as needed for nausea or vomiting., Disp: 30 tablet, Rfl: 1   pilocarpine (SALAGEN) 5 MG tablet, Take 1 tablet (5 mg total) by mouth 3 (three) times daily. For dry mouth from radiation, Disp: 90 tablet, Rfl: 1   prochlorperazine (COMPAZINE) 10 MG tablet, Take 1 tablet (10 mg total) by mouth every 6 (six) hours as needed for nausea or vomiting., Disp: 30 tablet, Rfl: 1   TURMERIC PO, Take 538 mg by mouth daily., Disp: , Rfl:  No current facility-administered medications for this visit.  Facility-Administered Medications Ordered in Other Visits:    0.9 %  sodium chloride infusion, , Intravenous, Continuous, Creig Hines, MD, Stopped at 11/25/23 1053   heparin lock flush 100 unit/mL, 500 Units, Intracatheter, Once PRN, Creig Hines, MD  Physical exam:  Vitals:   11/25/23 0900  BP: 125/60  Pulse: 75  Resp: 17  Temp: 98.3 F (36.8 C)  TempSrc: Oral  SpO2: 96%  Weight: 163 lb (73.9 kg)   Physical Exam Cardiovascular:     Rate and Rhythm: Normal rate and regular rhythm.     Heart sounds: Normal heart sounds.  Pulmonary:     Effort: Pulmonary effort is normal.     Breath sounds: Normal breath sounds.  Skin:    General: Skin is warm and dry.  Neurological:     Mental Status: She is alert and oriented to person, place, and time.         Latest Ref Rng & Units 11/25/2023    8:45 AM  CMP  Glucose 70 - 99 mg/dL 161   BUN 8 - 23 mg/dL 18   Creatinine  0.96 - 1.00 mg/dL 0.45   Sodium 409 - 811  mmol/L 137   Potassium 3.5 - 5.1 mmol/L 3.9   Chloride 98 - 111 mmol/L 103   CO2 22 - 32 mmol/L 22   Calcium 8.9 - 10.3 mg/dL 9.0   Total Protein 6.5 - 8.1 g/dL 7.3   Total Bilirubin 0.0 - 1.2 mg/dL 0.7   Alkaline Phos 38 - 126 U/L 62   AST 15 - 41 U/L 24   ALT 0 - 44 U/L 18       Latest Ref Rng & Units 11/25/2023    8:45 AM  CBC  WBC 4.0 - 10.5 K/uL 6.1   Hemoglobin 12.0 - 15.0 g/dL 16.1   Hematocrit 09.6 - 46.0 % 43.2   Platelets 150 - 400 K/uL 180     No images are attached to the encounter.  No results found.   Assessment and plan- Patient is a 83 y.o. female with history of stage IIIb malignant melanoma T0 N1b M0 here for on treatment assessment prior to cycle 5 of adjuvant Keytruda  Counts okay to proceed with cycle 5 of adjuvant Keytruda today.  She will be seen by covering provider in 3 weeks for cycle 6 and I will see her back in 6 weeks for cycle 7.  [facility] and therefore holding off on any imaging at my end.  However if it is only a CT chest neck I will consider getting PET scan in the future.  Drug-induced skin rash: Likely secondary to Keytruda induced dermatitis.  Continue topical Aveeno cream.  She does not require any topical steroids at this time since the rash is not worsening and it does not seem to bother her as such   Visit Diagnosis 1. Melanoma of skin (HCC)   2. Encounter for antineoplastic immunotherapy      Dr. Owens Shark, MD, MPH [facility] at [facility] [phone] 11/25/2023 12:56 PM

## 2023-11-27 DIAGNOSIS — N3946 Mixed incontinence: Secondary | ICD-10-CM | POA: Diagnosis not present

## 2023-11-28 DIAGNOSIS — C801 Malignant (primary) neoplasm, unspecified: Secondary | ICD-10-CM | POA: Diagnosis not present

## 2023-11-28 DIAGNOSIS — C434 Malignant melanoma of scalp and neck: Secondary | ICD-10-CM | POA: Diagnosis not present

## 2023-12-01 DIAGNOSIS — C434 Malignant melanoma of scalp and neck: Secondary | ICD-10-CM | POA: Diagnosis not present

## 2023-12-01 DIAGNOSIS — R490 Dysphonia: Secondary | ICD-10-CM | POA: Diagnosis not present

## 2023-12-01 DIAGNOSIS — Z7982 Long term (current) use of aspirin: Secondary | ICD-10-CM | POA: Diagnosis not present

## 2023-12-01 DIAGNOSIS — Z9226 Personal history of immune checkpoint inhibitor therapy: Secondary | ICD-10-CM | POA: Diagnosis not present

## 2023-12-01 DIAGNOSIS — Z881 Allergy status to other antibiotic agents status: Secondary | ICD-10-CM | POA: Diagnosis not present

## 2023-12-01 DIAGNOSIS — M542 Cervicalgia: Secondary | ICD-10-CM | POA: Diagnosis not present

## 2023-12-01 DIAGNOSIS — R221 Localized swelling, mass and lump, neck: Secondary | ICD-10-CM | POA: Diagnosis not present

## 2023-12-01 DIAGNOSIS — K219 Gastro-esophageal reflux disease without esophagitis: Secondary | ICD-10-CM | POA: Diagnosis not present

## 2023-12-01 DIAGNOSIS — Z87891 Personal history of nicotine dependence: Secondary | ICD-10-CM | POA: Diagnosis not present

## 2023-12-01 DIAGNOSIS — Z85818 Personal history of malignant neoplasm of other sites of lip, oral cavity, and pharynx: Secondary | ICD-10-CM | POA: Diagnosis not present

## 2023-12-01 DIAGNOSIS — E785 Hyperlipidemia, unspecified: Secondary | ICD-10-CM | POA: Diagnosis not present

## 2023-12-11 DIAGNOSIS — N3281 Overactive bladder: Secondary | ICD-10-CM | POA: Diagnosis not present

## 2023-12-15 ENCOUNTER — Other Ambulatory Visit: Payer: Self-pay | Admitting: Nurse Practitioner

## 2023-12-16 ENCOUNTER — Encounter: Payer: Self-pay | Admitting: Nurse Practitioner

## 2023-12-16 ENCOUNTER — Inpatient Hospital Stay: Payer: PPO | Admitting: Nurse Practitioner

## 2023-12-16 ENCOUNTER — Inpatient Hospital Stay: Payer: PPO

## 2023-12-16 ENCOUNTER — Inpatient Hospital Stay: Payer: PPO | Attending: Oncology

## 2023-12-16 VITALS — BP 139/51 | HR 65

## 2023-12-16 VITALS — BP 150/62 | HR 67 | Temp 96.4°F | Resp 16 | Wt 163.0 lb

## 2023-12-16 DIAGNOSIS — C770 Secondary and unspecified malignant neoplasm of lymph nodes of head, face and neck: Secondary | ICD-10-CM | POA: Diagnosis not present

## 2023-12-16 DIAGNOSIS — Z5112 Encounter for antineoplastic immunotherapy: Secondary | ICD-10-CM | POA: Insufficient documentation

## 2023-12-16 DIAGNOSIS — C439 Malignant melanoma of skin, unspecified: Secondary | ICD-10-CM

## 2023-12-16 DIAGNOSIS — Z79899 Other long term (current) drug therapy: Secondary | ICD-10-CM | POA: Diagnosis not present

## 2023-12-16 LAB — CBC WITH DIFFERENTIAL (CANCER CENTER ONLY)
Abs Immature Granulocytes: 0.02 10*3/uL (ref 0.00–0.07)
Basophils Absolute: 0 10*3/uL (ref 0.0–0.1)
Basophils Relative: 0 %
Eosinophils Absolute: 0.1 10*3/uL (ref 0.0–0.5)
Eosinophils Relative: 2 %
HCT: 40.8 % (ref 36.0–46.0)
Hemoglobin: 13.7 g/dL (ref 12.0–15.0)
Immature Granulocytes: 0 %
Lymphocytes Relative: 30 %
Lymphs Abs: 2 10*3/uL (ref 0.7–4.0)
MCH: 30.2 pg (ref 26.0–34.0)
MCHC: 33.6 g/dL (ref 30.0–36.0)
MCV: 90.1 fL (ref 80.0–100.0)
Monocytes Absolute: 0.5 10*3/uL (ref 0.1–1.0)
Monocytes Relative: 8 %
Neutro Abs: 3.9 10*3/uL (ref 1.7–7.7)
Neutrophils Relative %: 60 %
Platelet Count: 151 10*3/uL (ref 150–400)
RBC: 4.53 MIL/uL (ref 3.87–5.11)
RDW: 12.7 % (ref 11.5–15.5)
WBC Count: 6.5 10*3/uL (ref 4.0–10.5)
nRBC: 0 % (ref 0.0–0.2)

## 2023-12-16 LAB — CMP (CANCER CENTER ONLY)
ALT: 19 U/L (ref 0–44)
AST: 22 U/L (ref 15–41)
Albumin: 3.9 g/dL (ref 3.5–5.0)
Alkaline Phosphatase: 59 U/L (ref 38–126)
Anion gap: 9 (ref 5–15)
BUN: 19 mg/dL (ref 8–23)
CO2: 26 mmol/L (ref 22–32)
Calcium: 9.1 mg/dL (ref 8.9–10.3)
Chloride: 102 mmol/L (ref 98–111)
Creatinine: 0.57 mg/dL (ref 0.44–1.00)
GFR, Estimated: >60 mL/min (ref >60)
Glucose, Bld: 106 mg/dL — ABNORMAL HIGH (ref 70–99)
Potassium: 4 mmol/L (ref 3.5–5.1)
Sodium: 137 mmol/L (ref 135–145)
Total Bilirubin: 0.7 mg/dL (ref 0.0–1.2)
Total Protein: 7.2 g/dL (ref 6.5–8.1)

## 2023-12-16 LAB — TSH: TSH: 0.891 u[IU]/mL (ref 0.350–4.500)

## 2023-12-16 MED ORDER — SODIUM CHLORIDE 0.9 % IV SOLN
INTRAVENOUS | Status: DC
Start: 2023-12-16 — End: 2023-12-16
  Filled 2023-12-16: qty 250

## 2023-12-16 MED ORDER — HEPARIN SOD (PORK) LOCK FLUSH 100 UNIT/ML IV SOLN
500.0000 [IU] | Freq: Once | INTRAVENOUS | Status: AC | PRN
  Administered 2023-12-16: 500 [IU]
  Filled 2023-12-16: qty 5

## 2023-12-16 MED ORDER — SODIUM CHLORIDE 0.9 % IV SOLN
200.0000 mg | Freq: Once | INTRAVENOUS | Status: AC
  Administered 2023-12-16: 200 mg via INTRAVENOUS
  Filled 2023-12-16: qty 8

## 2023-12-16 NOTE — Patient Instructions (Signed)
 [facility]  Discharge Instructions: Thank you for choosing [facility] to provide your oncology and hematology care.  If you have a lab appointment with the Cancer Center, please go directly to the Cancer Center and check in at the registration area.  Wear comfortable clothing and clothing appropriate for easy access to any Portacath or PICC line.   We strive to give you quality time with your provider. You may need to reschedule your appointment if you arrive late (15 or more minutes).  Arriving late affects you and other patients whose appointments are after yours.  Also, if you miss three or more appointments without notifying the office, you may be dismissed from the clinic at the provider's discretion.      For prescription refill requests, have your pharmacy contact our office and allow 72 hours for refills to be completed.    Today you received the following chemotherapy and/or immunotherapy agents Keytruda      To help prevent nausea and vomiting after your treatment, we encourage you to take your nausea medication as directed.  BELOW ARE SYMPTOMS THAT SHOULD BE REPORTED IMMEDIATELY: *FEVER GREATER THAN 100.4 F (38 C) OR HIGHER *CHILLS OR SWEATING *NAUSEA AND VOMITING THAT IS NOT CONTROLLED WITH YOUR NAUSEA MEDICATION *UNUSUAL SHORTNESS OF BREATH *UNUSUAL BRUISING OR BLEEDING *URINARY PROBLEMS (pain or burning when urinating, or frequent urination) *BOWEL PROBLEMS (unusual diarrhea, constipation, pain near the anus) TENDERNESS IN MOUTH AND THROAT WITH OR WITHOUT PRESENCE OF ULCERS (sore throat, sores in mouth, or a toothache) UNUSUAL RASH, SWELLING OR PAIN  UNUSUAL VAGINAL DISCHARGE OR ITCHING   Items with * indicate a potential emergency and should be followed up as soon as possible or go to the Emergency Department if any problems should occur.  Please show the CHEMOTHERAPY ALERT CARD or IMMUNOTHERAPY  ALERT CARD at check-in to the Emergency Department and triage nurse.  Should you have questions after your visit or need to cancel or reschedule your appointment, please contact [facility] - A DEPT OF [facility]  [phone] and follow the prompts.  Office hours are 8:00 a.m. to 4:30 p.m. Monday - Friday. Please note that voicemails left after 4:00 p.m. may not be returned until the following business day.  We are closed weekends and major holidays. You have access to a nurse at all times for urgent questions. Please call the main number to the clinic [phone] and follow the prompts.  For any non-urgent questions, you may also contact your provider using MyChart. We now offer e-Visits for anyone 58 and older to request care online for non-urgent symptoms. For details visit [email or web address].   Also download the MyChart app! Go to the app store, search "MyChart", open the app, select [place], and log in with your MyChart username and password.

## 2023-12-16 NOTE — Progress Notes (Signed)
 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  This was followed by a PET CT scan on 06/11/2023 which showed a 2.8 cm right level 2 lymph node with an SUV of 31.  Peripheral nodular hypermetabolism compatible with irregular rim enhancement.  12 mm nodular component along the inferior aspect of the lesion measuring SUV 10.1.  No other evidence of distant metastatic disease.  Core biopsy showed necrosis but was not conclusive for malignancy.  She was then referred to [facility] and underwent definitive excision of the right cervical lymph node on 07/17/2023.  Pathology showed malignant neoplasm with extensive necrosis consistent with metastatic melanoma.  BRAF negative surgical margins negative.  Right level 2A2B3 and 4 lymph nodes and right tonsil as well as right retromolar trigone biopsy all negative for malignancy. She was staged as stage IIIB T0 N1b M0.  She received adjuvant radiation therapy in [facility].  Pros and cons of adjuvant immunotherapy discussed with the patient and patient chose to receive care closer to home in [place].  Interval history- [name] is a 83 y.o. female who returns to clinic for consideration of continuation of adjuvant [place]. She has similar maculopapular rash with is not itchy or bothersome. She denies new masses or swellings. Denies diarrhea or cough. No  shortness of breath.   ECOG PS- 1 Pain scale- 0  Review of systems- Review of Systems  Constitutional:  Negative for chills, fever, malaise/fatigue and weight loss.  HENT:  Negative for congestion, ear discharge and nosebleeds.   Respiratory:  Negative for cough, hemoptysis, sputum production, shortness of breath and wheezing.   Cardiovascular:  Negative for chest pain, palpitations, orthopnea and claudication.  Gastrointestinal:  Negative for abdominal pain, blood in stool, constipation, diarrhea, heartburn, melena, nausea and vomiting.  Genitourinary:  Negative for dysuria, flank pain, frequency, hematuria and urgency.  Musculoskeletal:  Negative for back pain, joint pain and myalgias.  Skin:  Positive for rash.  Neurological:  Negative for dizziness, tingling, focal weakness, seizures, weakness and headaches.  Endo/Heme/Allergies:  Does not bruise/bleed easily.  Psychiatric/Behavioral:  Negative for depression and suicidal ideas. The patient does not have insomnia.      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   IR  IMAGING GUIDED PORT INSERTION  08/21/2023   KNEE ARTHROSCOPY W/ MENISCAL REPAIR Left    PARTIAL KNEE ARTHROPLASTY Right 09/16/2017   PARTIAL KNEE ARTHROPLASTY Right 09/16/2017   Procedure: RIGHT UNICOMPARTMENTAL KNEE;  Surgeon: Kathryne Hitch, MD;  Location: [facility] OR;  Service: Orthopedics;  Laterality: Right;   SHOULDER ARTHROSCOPY Left  11/2018   TOTAL KNEE REVISION Right 06/15/2019   Procedure: RIGHT UNICOMPARTMENTAL ARTHROPLASTY TO TOTAL KNEE ARTHROPLASTY;  Surgeon: Kathryne Hitch, MD;  Location: [facility] OR;  Service: Orthopedics;  Laterality: Right;   TUBAL LIGATION      Social History   Socioeconomic History   Marital status: Married    Spouse name: Monzerat Handler   Number of children: 2   Years of education: Not on file   Highest education level: 12th grade  Occupational History   Occupation: Retired  Tobacco Use   Smoking status: Former    Current packs/day: 0.00    Types: Cigarettes    Quit date: 12/28/1986    Years since quitting: 36.9   Smokeless tobacco: Never  Vaping Use   Vaping status: Never Used  Substance and Sexual Activity   Alcohol use: No   Drug use: No   Sexual activity: Not Currently  Other Topics Concern   Not on file  Social History Narrative   Not on file   Social Drivers of Health   Financial Resource Strain: Low Risk  (08/22/2023)   Overall Financial Resource Strain (CARDIA)    Difficulty of Paying Living Expenses: Not hard at all  Food Insecurity: No Food Insecurity (08/22/2023)   Hunger Vital Sign    Worried About Running Out of Food in the Last Year: Never true    Ran Out of Food in the Last Year: Never true  Transportation Needs: No Transportation Needs (08/22/2023)   PRAPARE - Administrator, Civil Service (Medical): No    Lack of Transportation (Non-Medical): No  Physical Activity: Insufficiently Active (08/22/2023)   Exercise Vital Sign    Days of Exercise per Week: 4 days    Minutes of Exercise per Session: 30 min  Stress: No Stress Concern Present (08/22/2023)   [name]    Feeling of Stress : Not at all  Social Connections: Moderately Integrated (08/22/2023)   Social Connection and Isolation Panel [NHANES]    Frequency of Communication with Friends and Family: More than three  times a week    Frequency of Social Gatherings with Friends and Family: Once a week    Attends Religious Services: More than 4 times per year    Active Member of [employer] or Organizations: No    Attends Banker Meetings: Never    Marital Status: Married  Catering manager Violence: Not At Risk (08/22/2023)   Humiliation, Afraid, Rape, and Kick questionnaire    Fear of Current or Ex-Partner: No    Emotionally Abused: No    Physically Abused: No    Sexually Abused: No    Family History  Problem Relation Age of Onset   Breast cancer Neg Hx     Current Outpatient Medications:    acetaminophen (TYLENOL) 325 MG tablet, Take by mouth., Disp: , Rfl:    aspirin EC 81 MG tablet, Take 81 mg by mouth daily., Disp: , Rfl:    atorvastatin (LIPITOR) 10 MG tablet, TAKE 1 TABLET BY MOUTH EVERY DAY, Disp: 90 tablet, Rfl: 1   Blood Glucose Monitoring Suppl DEVI, Use  to check blood sugar twice a day.  DX R73.03. May substitute to any manufacturer covered by patient's insurance., Disp: 1 each, Rfl: 0   Calcium Carb-Cholecalciferol 600-800 MG-UNIT TABS, Take 1 tablet by mouth 2 (two) times daily. , Disp: , Rfl:    cetirizine (ZYRTEC) 10 MG tablet, Take 10 mg by mouth daily., Disp: , Rfl:    clotrimazole-betamethasone (LOTRISONE) cream, APPLY 1-2 TIMES A DAY AS NEEDED FOR SPOT TREATMENT FOR YEAST SKIN RASH, Disp: 30 g, Rfl: 1   esomeprazole (NEXIUM) 20 MG capsule, Take 1 capsule (20 mg total) by mouth 2 (two) times daily before a meal., Disp: 180 capsule, Rfl: 1   fluticasone (FLONASE) 50 MCG/ACT nasal spray, Place 2 sprays into the nose at bedtime. , Disp: , Rfl:    Glucosamine-Chondroitin (COSAMIN DS PO), Take 1 tablet by mouth 2 (two) times daily., Disp: , Rfl:    glucose blood (ONETOUCH VERIO) test strip, USE TO CHECK BLOOD SUGAR TWICE A DAY. DX R73.03., Disp: 100 strip, Rfl: 1   Lancets Misc. MISC, Use to check blood sugar twice a day.  DX R73.03. May substitute to any manufacturer covered by  patient's insurance., Disp: 100 each, Rfl: 0   lidocaine-prilocaine (EMLA) cream, Apply to affected area once, Disp: 30 g, Rfl: 3   Multiple Vitamins-Minerals (MULTIVITAMIN WITH MINERALS) tablet, Take 1 tablet by mouth every evening. Women's One-A-Day, Disp: , Rfl:    nystatin (MYCOSTATIN/NYSTOP) powder, APPLY TOPICALLY 3 TIMES A DAY, Disp: 30 g, Rfl: 1   Omega-3 Fatty Acids (FISH OIL) 1000 MG CAPS, Take 1,000 mg by mouth daily., Disp: , Rfl:    ondansetron (ZOFRAN) 8 MG tablet, Take 1 tablet (8 mg total) by mouth every 8 (eight) hours as needed for nausea or vomiting., Disp: 30 tablet, Rfl: 1   pilocarpine (SALAGEN) 5 MG tablet, Take 1 tablet (5 mg total) by mouth 3 (three) times daily. For dry mouth from radiation, Disp: 90 tablet, Rfl: 1   prochlorperazine (COMPAZINE) 10 MG tablet, Take 1 tablet (10 mg total) by mouth every 6 (six) hours as needed for nausea or vomiting., Disp: 30 tablet, Rfl: 1   TURMERIC PO, Take 538 mg by mouth daily., Disp: , Rfl:   Physical exam:  Vitals:   12/16/23 0938  BP: (!) 150/62  Pulse: 67  Resp: 16  Temp: (!) 96.4 F (35.8 C)  TempSrc: Tympanic  SpO2: 96%  Weight: 163 lb (73.9 kg)   Physical Exam Vitals reviewed.  Constitutional:      Appearance: She is not ill-appearing.  Cardiovascular:     Rate and Rhythm: Normal rate and regular rhythm.  Pulmonary:     Effort: Pulmonary effort is normal.     Breath sounds: Normal breath sounds.  Skin:    General: Skin is warm and dry.  Neurological:     Mental Status: She is alert and oriented to person, place, and time.  Psychiatric:        Mood and Affect: Mood normal.        Behavior: Behavior normal.        Latest Ref Rng & Units 12/16/2023    8:59 AM  CMP  Glucose 70 - 99 mg/dL 086   BUN 8 - 23 mg/dL 19   Creatinine 5.78 - 1.00 mg/dL 4.69   Sodium 629 - 528 mmol/L 137   Potassium 3.5 - 5.1 mmol/L 4.0   Chloride 98 - 111 mmol/L 102   CO2 22 - 32 mmol/L 26  Calcium 8.9 - 10.3 mg/dL 9.1    Total Protein 6.5 - 8.1 g/dL 7.2   Total Bilirubin 0.0 - 1.2 mg/dL 0.7   Alkaline Phos 38 - 126 U/L 59   AST 15 - 41 U/L 22   ALT 0 - 44 U/L 19       Latest Ref Rng & Units 12/16/2023    8:59 AM  CBC  WBC 4.0 - 10.5 K/uL 6.5   Hemoglobin 12.0 - 15.0 g/dL 16.1   Hematocrit 09.6 - 46.0 % 40.8   Platelets 150 - 400 K/uL 151    No results found.   Assessment and plan- Patient is a 82 y.o. female   History of stage IIIb malignant melanoma - T0 N1b M0 here for on treatment assessment prior to cycle 5 of adjuvant Keytruda. Labs reviewed and acceptable for continuation of treatment. Tolerating well. Plan to rtc in 3 weeks for consideration of cycle 6. We reviewed imaging that was performed at [facility] which included CT chest/neck therefore, we will get PET whole body to evaluate response.  Drug induced skin rash- secondary to pembrolizumab dermatitis. Currently using topical emollient cream. Not bothersome. Cosmetic only. Hold off on topical steroids unless symptoms worsen or become increasingly symptomatic.   Disposition:  PET in next 2-3 weeks F/u as scheduled- la    Visit Diagnosis 1. Melanoma of skin (HCC)   2. Encounter for antineoplastic immunotherapy    Consuello Masse, DNP, AGNP-C, [facility] [phone] (clinic) 12/16/2023

## 2023-12-17 LAB — T4: T4, Total: 8 ug/dL (ref 4.5–12.0)

## 2023-12-18 ENCOUNTER — Encounter: Payer: Self-pay | Admitting: Oncology

## 2023-12-18 ENCOUNTER — Telehealth: Payer: Self-pay | Admitting: *Deleted

## 2023-12-18 NOTE — Telephone Encounter (Signed)
 Patient has help for the [place] and it is free. The questions she has from her bills she will need to call [phone]. She has the number now so she will call.

## 2024-01-02 ENCOUNTER — Ambulatory Visit
Admission: RE | Admit: 2024-01-02 | Discharge: 2024-01-02 | Disposition: A | Discharge status: Home / Self Care | Source: Ambulatory Visit | Attending: Nurse Practitioner | Admitting: Nurse Practitioner

## 2024-01-02 DIAGNOSIS — C439 Malignant melanoma of skin, unspecified: Secondary | ICD-10-CM | POA: Diagnosis not present

## 2024-01-02 DIAGNOSIS — R9389 Abnormal findings on diagnostic imaging of other specified body structures: Secondary | ICD-10-CM | POA: Diagnosis not present

## 2024-01-02 DIAGNOSIS — Z5112 Encounter for antineoplastic immunotherapy: Secondary | ICD-10-CM | POA: Insufficient documentation

## 2024-01-02 LAB — GLUCOSE, CAPILLARY: Glucose-Capillary: 104 mg/dL — ABNORMAL HIGH (ref 70–99)

## 2024-01-02 MED ORDER — FLUDEOXYGLUCOSE F - 18 (FDG) INJECTION
8.5800 | Freq: Once | INTRAVENOUS | Status: AC | PRN
  Administered 2024-01-02: 8.58 via INTRAVENOUS

## 2024-01-05 ENCOUNTER — Encounter: Payer: Self-pay | Admitting: Oncology

## 2024-01-05 ENCOUNTER — Other Ambulatory Visit: Payer: Self-pay | Admitting: Oncology

## 2024-01-05 DIAGNOSIS — C439 Malignant melanoma of skin, unspecified: Secondary | ICD-10-CM

## 2024-01-06 ENCOUNTER — Ambulatory Visit: Admitting: Oncology

## 2024-01-06 ENCOUNTER — Other Ambulatory Visit

## 2024-01-06 ENCOUNTER — Inpatient Hospital Stay: Payer: PPO

## 2024-01-06 ENCOUNTER — Inpatient Hospital Stay: Payer: PPO | Admitting: Oncology

## 2024-01-06 ENCOUNTER — Ambulatory Visit

## 2024-01-07 ENCOUNTER — Inpatient Hospital Stay

## 2024-01-07 ENCOUNTER — Encounter: Payer: Self-pay | Admitting: Oncology

## 2024-01-07 ENCOUNTER — Inpatient Hospital Stay: Attending: Oncology

## 2024-01-07 ENCOUNTER — Inpatient Hospital Stay: Admitting: Oncology

## 2024-01-07 VITALS — BP 144/80 | HR 71 | Temp 96.4°F | Resp 16 | Ht 61.0 in | Wt 162.5 lb

## 2024-01-07 DIAGNOSIS — C439 Malignant melanoma of skin, unspecified: Secondary | ICD-10-CM | POA: Insufficient documentation

## 2024-01-07 DIAGNOSIS — Z79899 Other long term (current) drug therapy: Secondary | ICD-10-CM | POA: Insufficient documentation

## 2024-01-07 DIAGNOSIS — C770 Secondary and unspecified malignant neoplasm of lymph nodes of head, face and neck: Secondary | ICD-10-CM | POA: Insufficient documentation

## 2024-01-07 DIAGNOSIS — Z5112 Encounter for antineoplastic immunotherapy: Secondary | ICD-10-CM | POA: Insufficient documentation

## 2024-01-07 LAB — CBC WITH DIFFERENTIAL (CANCER CENTER ONLY)
Abs Immature Granulocytes: 0.02 K/uL (ref 0.00–0.07)
Basophils Absolute: 0 K/uL (ref 0.0–0.1)
Basophils Relative: 0 %
Eosinophils Absolute: 0.1 K/uL (ref 0.0–0.5)
Eosinophils Relative: 2 %
HCT: 42.4 % (ref 36.0–46.0)
Hemoglobin: 13.9 g/dL (ref 12.0–15.0)
Immature Granulocytes: 0 %
Lymphocytes Relative: 33 %
Lymphs Abs: 1.9 K/uL (ref 0.7–4.0)
MCH: 30 pg (ref 26.0–34.0)
MCHC: 32.8 g/dL (ref 30.0–36.0)
MCV: 91.4 fL (ref 80.0–100.0)
Monocytes Absolute: 0.4 K/uL (ref 0.1–1.0)
Monocytes Relative: 8 %
Neutro Abs: 3.3 K/uL (ref 1.7–7.7)
Neutrophils Relative %: 57 %
Platelet Count: 170 K/uL (ref 150–400)
RBC: 4.64 MIL/uL (ref 3.87–5.11)
RDW: 13.1 % (ref 11.5–15.5)
WBC Count: 5.8 K/uL (ref 4.0–10.5)
nRBC: 0 % (ref 0.0–0.2)

## 2024-01-07 LAB — CMP (CANCER CENTER ONLY)
ALT: 17 U/L (ref 0–44)
AST: 23 U/L (ref 15–41)
Albumin: 4 g/dL (ref 3.5–5.0)
Alkaline Phosphatase: 58 U/L (ref 38–126)
Anion gap: 9 (ref 5–15)
BUN: 17 mg/dL (ref 8–23)
CO2: 25 mmol/L (ref 22–32)
Calcium: 8.8 mg/dL — ABNORMAL LOW (ref 8.9–10.3)
Chloride: 101 mmol/L (ref 98–111)
Creatinine: 0.76 mg/dL (ref 0.44–1.00)
GFR, Estimated: >60 mL/min (ref >60)
Glucose, Bld: 103 mg/dL — ABNORMAL HIGH (ref 70–99)
Potassium: 3.9 mmol/L (ref 3.5–5.1)
Sodium: 135 mmol/L (ref 135–145)
Total Bilirubin: 0.6 mg/dL (ref 0.0–1.2)
Total Protein: 7 g/dL (ref 6.5–8.1)

## 2024-01-07 MED ORDER — HEPARIN SOD (PORK) LOCK FLUSH 100 UNIT/ML IV SOLN
500.0000 [IU] | Freq: Once | INTRAVENOUS | Status: AC | PRN
  Administered 2024-01-07: 500 [IU]
  Filled 2024-01-07: qty 5

## 2024-01-07 MED ORDER — SODIUM CHLORIDE 0.9 % IV SOLN
200.0000 mg | Freq: Once | INTRAVENOUS | Status: AC
  Administered 2024-01-07: 200 mg via INTRAVENOUS
  Filled 2024-01-07: qty 8

## 2024-01-07 MED ORDER — SODIUM CHLORIDE 0.9% FLUSH
10.0000 mL | Freq: Once | INTRAVENOUS | Status: AC
  Administered 2024-01-07: 10 mL via INTRAVENOUS
  Filled 2024-01-07: qty 10

## 2024-01-07 MED ORDER — SODIUM CHLORIDE 0.9 % IV SOLN
INTRAVENOUS | Status: DC
  Filled 2024-01-07: qty 250

## 2024-01-07 NOTE — Progress Notes (Signed)
 Patient denies new or acute problems/concerns today.

## 2024-01-07 NOTE — Progress Notes (Signed)
 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  This was followed by a PET CT scan on 06/11/2023 which showed a 2.8 cm right level 2 lymph node with an SUV of 31.  Peripheral nodular hypermetabolism compatible with irregular rim enhancement.  12 mm nodular component along the inferior aspect of the lesion measuring SUV 10.1.  No other evidence of distant metastatic disease.  Core biopsy showed necrosis but was not conclusive for malignancy.  She was then referred to [facility] and underwent definitive excision of the right cervical lymph node on 07/17/2023.  Pathology showed malignant neoplasm with extensive necrosis consistent with metastatic melanoma.  BRAF negative surgical margins negative.  Right level 2A, 2B, 3 and 4 lymph nodes and right tonsil as well as right retromolar trigone biopsy all negative for malignancy. She was staged as stage IIIB T0 N1b M0.  She received adjuvant radiation therapy in [facility].  Pros and cons of adjuvant immunotherapy discussed with the patient and patient chose to receive care closer to home in [place].  Interval history- she has on and off skin rash in her extremities which is non pruritic and does not bother her. Denies other complaints at this time  ECOG PS- 1 Pain scale- 0    Review of systems- Review of Systems  Constitutional:  Negative for chills,  fever, malaise/fatigue and weight loss.  HENT:  Negative for congestion, ear discharge and nosebleeds.   Eyes:  Negative for blurred vision.  Respiratory:  Negative for cough, hemoptysis, sputum production, shortness of breath and wheezing.   Cardiovascular:  Negative for chest pain, palpitations, orthopnea and claudication.  Gastrointestinal:  Negative for abdominal pain, blood in stool, constipation, diarrhea, heartburn, melena, nausea and vomiting.  Genitourinary:  Negative for dysuria, flank pain, frequency, hematuria and urgency.  Musculoskeletal:  Negative for back pain, joint pain and myalgias.  Skin:  Positive for rash.  Neurological:  Negative for dizziness, tingling, focal weakness, seizures, weakness and headaches.  Endo/Heme/Allergies:  Does not bruise/bleed easily.  Psychiatric/Behavioral:  Negative for depression and suicidal ideas. The patient does not have insomnia.       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   IR IMAGING GUIDED PORT INSERTION  08/21/2023   KNEE ARTHROSCOPY W/ MENISCAL REPAIR Left    PARTIAL KNEE ARTHROPLASTY Right 09/16/2017   PARTIAL KNEE ARTHROPLASTY Right 09/16/2017   Procedure: RIGHT UNICOMPARTMENTAL KNEE;  Surgeon: Kathryne Hitch, MD;  Location: [facility] OR;  Service: Orthopedics;  Laterality: Right;   SHOULDER ARTHROSCOPY Left 11/2018   TOTAL KNEE REVISION Right 06/15/2019   Procedure: RIGHT UNICOMPARTMENTAL  ARTHROPLASTY TO TOTAL KNEE ARTHROPLASTY;  Surgeon: Kathryne Hitch, MD;  Location: [facility] OR;  Service: Orthopedics;  Laterality: Right;   TUBAL LIGATION      Social History   Socioeconomic History   Marital status: Married    Spouse name: Ane Conerly   Number of children: 2   Years of education: Not on file   Highest education level: 12th grade  Occupational History   Occupation: Retired  Tobacco Use   Smoking status: Former    Current packs/day: 0.00    Types: Cigarettes    Quit date: 12/28/1986    Years since quitting: 37.0   Smokeless tobacco: Never  Vaping Use   Vaping status: Never Used  Substance and Sexual Activity   Alcohol use: No   Drug use: No   Sexual activity: Not Currently  Other Topics Concern   Not on file  Social History Narrative   Not on file   Social Drivers of Health   Financial Resource Strain: Low Risk  (08/22/2023)   Overall Financial Resource Strain (CARDIA)    Difficulty of Paying Living Expenses: Not hard at all  Food Insecurity: No Food Insecurity (08/22/2023)   Hunger Vital Sign    Worried About Running Out of Food in the Last Year: Never true    Ran Out of Food in the Last Year: Never true  Transportation Needs: No Transportation Needs (08/22/2023)   PRAPARE - Administrator, Civil Service (Medical): No    Lack of Transportation (Non-Medical): No  Physical Activity: Insufficiently Active (08/22/2023)   Exercise Vital Sign    Days of Exercise per Week: 4 days    Minutes of Exercise per Session: 30 min  Stress: No Stress Concern Present (08/22/2023)   [name]    Feeling of Stress : Not at all  Social Connections: Moderately Integrated (08/22/2023)   Social Connection and Isolation Panel [NHANES]    Frequency of Communication with Friends and Family: More than three times a week    Frequency of Social Gatherings with Friends and Family: Once a week     Attends Religious Services: More than 4 times per year    Active Member of [employer] or Organizations: No    Attends Banker Meetings: Never    Marital Status: Married  Catering manager Violence: Not At Risk (08/22/2023)   Humiliation, Afraid, Rape, and Kick questionnaire    Fear of Current or Ex-Partner: No    Emotionally Abused: No    Physically Abused: No    Sexually Abused: No    Family History  Problem Relation Age of Onset   Breast cancer Neg Hx      Current Outpatient Medications:    acetaminophen (TYLENOL) 325 MG tablet, Take by mouth., Disp: , Rfl:    aspirin EC 81 MG tablet, Take 81 mg by mouth daily., Disp: , Rfl:    atorvastatin (LIPITOR) 10 MG tablet, TAKE 1 TABLET BY MOUTH EVERY DAY, Disp: 90 tablet, Rfl: 1   Blood Glucose Monitoring Suppl DEVI, Use to check blood sugar  twice a day.  DX R73.03. May substitute to any manufacturer covered by patient's insurance., Disp: 1 each, Rfl: 0   Calcium Carb-Cholecalciferol 600-800 MG-UNIT TABS, Take 1 tablet by mouth 2 (two) times daily. , Disp: , Rfl:    cetirizine (ZYRTEC) 10 MG tablet, Take 10 mg by mouth daily., Disp: , Rfl:    clotrimazole-betamethasone (LOTRISONE) cream, APPLY 1-2 TIMES A DAY AS NEEDED FOR SPOT TREATMENT FOR YEAST SKIN RASH, Disp: 30 g, Rfl: 1   esomeprazole (NEXIUM) 20 MG capsule, Take 1 capsule (20 mg total) by mouth 2 (two) times daily before a meal., Disp: 180 capsule, Rfl: 1   fluticasone (FLONASE) 50 MCG/ACT nasal spray, Place 2 sprays into the nose at bedtime. , Disp: , Rfl:    Glucosamine-Chondroitin (COSAMIN DS PO), Take 1 tablet by mouth 2 (two) times daily., Disp: , Rfl:    glucose blood (ONETOUCH VERIO) test strip, USE TO CHECK BLOOD SUGAR TWICE A DAY. DX R73.03., Disp: 100 strip, Rfl: 1   Lancets Misc. MISC, Use to check blood sugar twice a day.  DX R73.03. May substitute to any manufacturer covered by patient's insurance., Disp: 100 each, Rfl: 0   lidocaine-prilocaine (EMLA) cream,  Apply to affected area once, Disp: 30 g, Rfl: 3   Multiple Vitamins-Minerals (MULTIVITAMIN WITH MINERALS) tablet, Take 1 tablet by mouth every evening. Women's One-A-Day, Disp: , Rfl:    nystatin (MYCOSTATIN/NYSTOP) powder, APPLY TOPICALLY 3 TIMES A DAY, Disp: 30 g, Rfl: 1   Omega-3 Fatty Acids (FISH OIL) 1000 MG CAPS, Take 1,000 mg by mouth daily., Disp: , Rfl:    ondansetron (ZOFRAN) 8 MG tablet, Take 1 tablet (8 mg total) by mouth every 8 (eight) hours as needed for nausea or vomiting., Disp: 30 tablet, Rfl: 1   pilocarpine (SALAGEN) 5 MG tablet, Take 1 tablet (5 mg total) by mouth 3 (three) times daily. For dry mouth from radiation (Patient taking differently: Take 5 mg by mouth 3 (three) times daily as needed. For dry mouth from radiation), Disp: 90 tablet, Rfl: 1   prochlorperazine (COMPAZINE) 10 MG tablet, Take 1 tablet (10 mg total) by mouth every 6 (six) hours as needed for nausea or vomiting., Disp: 30 tablet, Rfl: 1   TURMERIC PO, Take 538 mg by mouth daily., Disp: , Rfl:  No current facility-administered medications for this visit.  Facility-Administered Medications Ordered in Other Visits:    0.9 %  sodium chloride infusion, , Intravenous, Continuous, Creig Hines, MD, Last Rate: 10 mL/hr at 01/07/24 1113, New Bag at 01/07/24 1113   pembrolizumab (KEYTRUDA) 200 mg in sodium chloride 0.9 % 50 mL chemo infusion, 200 mg, Intravenous, Once, Creig Hines, MD  Physical exam:  Vitals:   01/07/24 1000  BP: (!) 144/80  Pulse: 71  Resp: 16  Temp: (!) 96.4 F (35.8 C)  TempSrc: Tympanic  Weight: 162 lb 8 oz (73.7 kg)  Height: 5\' 1"  (1.549 m)   Physical Exam Cardiovascular:     Rate and Rhythm: Normal rate and regular rhythm.     Heart sounds: Normal heart sounds.  Pulmonary:     Effort: Pulmonary effort is normal.     Breath sounds: Normal breath sounds.  Abdominal:     General: Bowel sounds are normal.     Palpations: Abdomen is soft.  Skin:    General: Skin is warm.      Comments: Mild erythematous maculopapular rash over b/l legs  Neurological:     Mental Status:  She is alert and oriented to person, place, and time.      I have personally reviewed labs listed below:    Latest Ref Rng & Units 01/07/2024   10:27 AM  CMP  Glucose 70 - 99 mg/dL 161   BUN 8 - 23 mg/dL 17   Creatinine 0.96 - 1.00 mg/dL 0.45   Sodium 409 - 811 mmol/L 135   Potassium 3.5 - 5.1 mmol/L 3.9   Chloride 98 - 111 mmol/L 101   CO2 22 - 32 mmol/L 25   Calcium 8.9 - 10.3 mg/dL 8.8   Total Protein 6.5 - 8.1 g/dL 7.0   Total Bilirubin 0.0 - 1.2 mg/dL 0.6   Alkaline Phos 38 - 126 U/L 58   AST 15 - 41 U/L 23   ALT 0 - 44 U/L 17       Latest Ref Rng & Units 01/07/2024   10:27 AM  CBC  WBC 4.0 - 10.5 K/uL 5.8   Hemoglobin 12.0 - 15.0 g/dL 91.4   Hematocrit 78.2 - 46.0 % 42.4   Platelets 150 - 400 K/uL 170    I have personally reviewed Radiology images listed below: No images are attached to the encounter.  NM PET Image Restage (PS) Whole Body Result Date: 01/07/2024 CLINICAL DATA:  Subsequent treatment strategy for melanoma. EXAM: NUCLEAR MEDICINE PET WHOLE BODY TECHNIQUE: 8.6 mCi F-18 FDG was injected intravenously. Full-ring PET imaging was performed from the head to foot after the radiotracer. CT data was obtained and used for attenuation correction and anatomic localization. Fasting blood glucose: 104 mg/dl COMPARISON:  95/62/1308. FINDINGS: Mediastinal blood pool activity: SUV max 2.8 HEAD/NECK: Focal uptake along the left oropharynx, SUV max 6.5. CT appearance is unchanged from 06/11/2023. This area is collapsed on CT, limiting further evaluation. Interval resection of a previously seen right level II nodal mass. No hypermetabolic lymph nodes. Incidental CT findings: None. CHEST: No abnormal hypermetabolism. Small subpectoral and axillary lymph nodes are not hypermetabolic. Incidental CT findings: Right IJ Port-A-Cath terminates in the SVC. Atherosclerotic calcification of the  aorta, aortic valve and coronary arteries. Heart is enlarged. No pericardial or pleural effusion. ABDOMEN/PELVIS: No abnormal hypermetabolism. Incidental CT findings: Small low-attenuation lesion in the left kidney. No specific follow-up necessary. SKELETON: Faint focal hypometabolism in the lateral right distal femur, SUV max 2.4, without a CT correlate. No abnormal hypermetabolism. Incidental CT findings: Degenerative changes in the spine. EXTREMITIES: No abnormal hypermetabolism. Incidental CT findings: Right knee arthroplasty. IMPRESSION: 1. No evidence of hypermetabolic metastatic disease. 2. Focal uptake along the left oropharynx and within the distal right femur, without CT correlates. Recommend attention on follow-up. Electronically Signed   By: Leanna Battles M.D.   On: 01/07/2024 10:17     Assessment and plan- Patient is a 83 y.o. female  with history of stage IIIb malignant melanoma T0 N1b M0. She is here for on treatment assessment prior to cycle 6 of adjuvant keytruda  I have reviewed PET CT scan images independently and discussed findings with the patient which does not show any evidence of recurrent or progressive disease.  Specifically no evidence of recurrent cervical adenopathy.  She has had focal uptake in the left oropharynx which was then even back on her prior PET scan in September 2024.  I will ask for metabolism in the right distal femur nonspecific.  I will plan to get a repeat scan in about 6 months.  Plan is to complete 1 year of adjuvant Keytruda  Visit Diagnosis 1. Melanoma of skin (HCC)   2. Encounter for antineoplastic immunotherapy      Dr. Owens Shark, MD, MPH [facility] [phone] 01/07/2024 11:58 AM

## 2024-01-09 DIAGNOSIS — R918 Other nonspecific abnormal finding of lung field: Secondary | ICD-10-CM | POA: Diagnosis not present

## 2024-01-09 DIAGNOSIS — C760 Malignant neoplasm of head, face and neck: Secondary | ICD-10-CM | POA: Diagnosis not present

## 2024-01-09 DIAGNOSIS — C434 Malignant melanoma of scalp and neck: Secondary | ICD-10-CM | POA: Diagnosis not present

## 2024-01-12 ENCOUNTER — Encounter: Payer: Self-pay | Admitting: Oncology

## 2024-01-12 DIAGNOSIS — C760 Malignant neoplasm of head, face and neck: Secondary | ICD-10-CM | POA: Diagnosis not present

## 2024-01-12 DIAGNOSIS — C434 Malignant melanoma of scalp and neck: Secondary | ICD-10-CM | POA: Diagnosis not present

## 2024-01-27 ENCOUNTER — Encounter: Payer: Self-pay | Admitting: Oncology

## 2024-01-28 ENCOUNTER — Inpatient Hospital Stay: Admitting: Oncology

## 2024-01-28 ENCOUNTER — Encounter: Payer: Self-pay | Admitting: Oncology

## 2024-01-28 ENCOUNTER — Inpatient Hospital Stay

## 2024-01-28 VITALS — BP 144/60 | HR 68 | Temp 96.0°F | Resp 18 | Ht 61.0 in | Wt 163.5 lb

## 2024-01-28 DIAGNOSIS — C439 Malignant melanoma of skin, unspecified: Secondary | ICD-10-CM

## 2024-01-28 DIAGNOSIS — Z5112 Encounter for antineoplastic immunotherapy: Secondary | ICD-10-CM | POA: Diagnosis not present

## 2024-01-28 DIAGNOSIS — L270 Generalized skin eruption due to drugs and medicaments taken internally: Secondary | ICD-10-CM

## 2024-01-28 LAB — CMP (CANCER CENTER ONLY)
ALT: 20 U/L (ref 0–44)
AST: 24 U/L (ref 15–41)
Albumin: 3.8 g/dL (ref 3.5–5.0)
Alkaline Phosphatase: 62 U/L (ref 38–126)
Anion gap: 9 (ref 5–15)
BUN: 15 mg/dL (ref 8–23)
CO2: 25 mmol/L (ref 22–32)
Calcium: 8.6 mg/dL — ABNORMAL LOW (ref 8.9–10.3)
Chloride: 103 mmol/L (ref 98–111)
Creatinine: 0.59 mg/dL (ref 0.44–1.00)
GFR, Estimated: >60 mL/min (ref >60)
Glucose, Bld: 118 mg/dL — ABNORMAL HIGH (ref 70–99)
Potassium: 3.9 mmol/L (ref 3.5–5.1)
Sodium: 137 mmol/L (ref 135–145)
Total Bilirubin: 0.7 mg/dL (ref 0.0–1.2)
Total Protein: 7.1 g/dL (ref 6.5–8.1)

## 2024-01-28 LAB — CBC WITH DIFFERENTIAL (CANCER CENTER ONLY)
Abs Immature Granulocytes: 0.02 10*3/uL (ref 0.00–0.07)
Basophils Absolute: 0 10*3/uL (ref 0.0–0.1)
Basophils Relative: 0 %
Eosinophils Absolute: 0.1 10*3/uL (ref 0.0–0.5)
Eosinophils Relative: 2 %
HCT: 39.6 % (ref 36.0–46.0)
Hemoglobin: 13 g/dL (ref 12.0–15.0)
Immature Granulocytes: 0 %
Lymphocytes Relative: 30 %
Lymphs Abs: 2.1 10*3/uL (ref 0.7–4.0)
MCH: 30 pg (ref 26.0–34.0)
MCHC: 32.8 g/dL (ref 30.0–36.0)
MCV: 91.2 fL (ref 80.0–100.0)
Monocytes Absolute: 0.4 10*3/uL (ref 0.1–1.0)
Monocytes Relative: 6 %
Neutro Abs: 4.1 10*3/uL (ref 1.7–7.7)
Neutrophils Relative %: 62 %
Platelet Count: 159 10*3/uL (ref 150–400)
RBC: 4.34 MIL/uL (ref 3.87–5.11)
RDW: 12.8 % (ref 11.5–15.5)
WBC Count: 6.8 10*3/uL (ref 4.0–10.5)
nRBC: 0 % (ref 0.0–0.2)

## 2024-01-28 MED ORDER — SODIUM CHLORIDE 0.9 % IV SOLN
INTRAVENOUS | Status: DC
Start: 2024-01-28 — End: 2024-01-28
  Filled 2024-01-28: qty 250

## 2024-01-28 MED ORDER — SODIUM CHLORIDE 0.9% FLUSH
10.0000 mL | INTRAVENOUS | Status: DC | PRN
  Administered 2024-01-28: 10 mL
  Filled 2024-01-28: qty 10

## 2024-01-28 MED ORDER — SODIUM CHLORIDE 0.9 % IV SOLN
200.0000 mg | Freq: Once | INTRAVENOUS | Status: AC
  Administered 2024-01-28: 200 mg via INTRAVENOUS
  Filled 2024-01-28: qty 8

## 2024-01-28 MED ORDER — HEPARIN SOD (PORK) LOCK FLUSH 100 UNIT/ML IV SOLN
500.0000 [IU] | Freq: Once | INTRAVENOUS | Status: AC | PRN
Start: 2024-01-28 — End: 2024-01-28
  Administered 2024-01-28: 500 [IU]
  Filled 2024-01-28: qty 5

## 2024-01-28 NOTE — Progress Notes (Signed)
 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  This was followed by a PET CT scan on 06/11/2023 which showed a 2.8 cm right level 2 lymph node with an SUV of 31.  Peripheral nodular hypermetabolism compatible with irregular rim enhancement.  12 mm nodular component along the inferior aspect of the lesion measuring SUV 10.1.  No other evidence of distant metastatic disease.  Core biopsy showed necrosis but was not conclusive for malignancy.  She was then referred to [facility] and underwent definitive excision of the right cervical lymph node on 07/17/2023.  Pathology showed malignant neoplasm with extensive necrosis consistent with metastatic melanoma.  BRAF negative surgical margins negative.  Right level 2A, 2B, 3 and 4 lymph nodes and right tonsil as well as right retromolar trigone biopsy all negative for malignancy. She was staged as stage IIIB T0 N1b M0.  She received adjuvant radiation therapy in [facility].  Pros and cons of adjuvant immunotherapy discussed with the patient and patient chose to receive care closer to home in [place].  Interval history-mild skin rash over her extremities is overall stable.  She is tolerating Keytruda  well so far without any significant side effects  ECOG PS- 1 Pain scale- 0   Review of systems- Review of Systems  Constitutional:  Negative for  chills, fever, malaise/fatigue and weight loss.  HENT:  Negative for congestion, ear discharge and nosebleeds.   Eyes:  Negative for blurred vision.  Respiratory:  Negative for cough, hemoptysis, sputum production, shortness of breath and wheezing.   Cardiovascular:  Negative for chest pain, palpitations, orthopnea and claudication.  Gastrointestinal:  Negative for abdominal pain, blood in stool, constipation, diarrhea, heartburn, melena, nausea and vomiting.  Genitourinary:  Negative for dysuria, flank pain, frequency, hematuria and urgency.  Musculoskeletal:  Negative for back pain, joint pain and myalgias.  Skin:  Negative for rash.  Neurological:  Negative for dizziness, tingling, focal weakness, seizures, weakness and headaches.  Endo/Heme/Allergies:  Does not bruise/bleed easily.  Psychiatric/Behavioral:  Negative for depression and suicidal ideas. The patient does not have insomnia.       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   IR IMAGING GUIDED PORT INSERTION  08/21/2023  KNEE ARTHROSCOPY W/ MENISCAL REPAIR Left    PARTIAL KNEE ARTHROPLASTY Right 09/16/2017   PARTIAL KNEE ARTHROPLASTY Right 09/16/2017   Procedure: RIGHT UNICOMPARTMENTAL KNEE;  Surgeon: Arnie Lao, MD;  Location: [facility] OR;  Service: Orthopedics;  Laterality: Right;   SHOULDER ARTHROSCOPY Left 11/2018   TOTAL KNEE REVISION Right 06/15/2019   Procedure: RIGHT UNICOMPARTMENTAL  ARTHROPLASTY TO TOTAL KNEE ARTHROPLASTY;  Surgeon: Arnie Lao, MD;  Location: [facility] OR;  Service: Orthopedics;  Laterality: Right;   TUBAL LIGATION      Social History   Socioeconomic History   Marital status: Married    Spouse name: Florette Thai   Number of children: 2   Years of education: Not on file   Highest education level: 12th grade  Occupational History   Occupation: Retired  Tobacco Use   Smoking status: Former    Current packs/day: 0.00    Types: Cigarettes    Quit date: 12/28/1986    Years since quitting: 37.1   Smokeless tobacco: Never  Vaping Use   Vaping status: Never Used  Substance and Sexual Activity   Alcohol use: No   Drug use: No   Sexual activity: Not Currently  Other Topics Concern   Not on file  Social History Narrative   Not on file   Social Drivers of Health   Financial Resource Strain: Low Risk  (08/22/2023)   Overall Financial Resource Strain (CARDIA)    Difficulty of Paying Living Expenses: Not hard at all  Food Insecurity: No Food Insecurity (01/12/2024)   [facility]   Hunger Vital Sign    Worried About Running Out of Food in the Last Year: Never true    Ran Out of Food in the Last Year: Never true  Transportation Needs: No Transportation Needs (01/12/2024)   Received from [facility]   PRAPARE - Transportation    Lack of Transportation (Medical): No    Lack of Transportation (Non-Medical): No  Physical Activity: Insufficiently Active (08/22/2023)   Exercise Vital Sign    Days of Exercise per Week: 4 days    Minutes of Exercise per Session: 30 min  Stress: No Stress Concern Present (08/22/2023)   [name]    Feeling of Stress : Not at all  Social Connections: Moderately Integrated (08/22/2023)   Social Connection and Isolation Panel [NHANES]    Frequency of Communication with Friends and Family: More than three times a week     Frequency of Social Gatherings with Friends and Family: Once a week    Attends Religious Services: More than 4 times per year    Active Member of [employer] or Organizations: No    Attends Banker Meetings: Never    Marital Status: Married  Catering manager Violence: Not At Risk (01/12/2024)   [facility]   Humiliation, Afraid, Rape, and Kick questionnaire    Fear of Current or Ex-Partner: No    Emotionally Abused: No    Physically Abused: No    Sexually Abused: No    Family History  Problem Relation Age of Onset   Breast cancer Neg Hx      Current Outpatient Medications:    acetaminophen  (TYLENOL ) 325 MG tablet, Take by mouth., Disp: , Rfl:    aspirin  EC 81 MG tablet, Take 81 mg by mouth daily., Disp: , Rfl:    atorvastatin  (LIPITOR) 10 MG tablet, TAKE 1 TABLET BY MOUTH EVERY  DAY, Disp: 90 tablet, Rfl: 1   Blood Glucose Monitoring Suppl DEVI, Use to check blood sugar twice a day.  DX R73.03. May substitute to any manufacturer covered by patient's insurance., Disp: 1 each, Rfl: 0   Calcium  Carb-Cholecalciferol  600-800 MG-UNIT TABS, Take 1 tablet by mouth 2 (two) times daily. , Disp: , Rfl:    cetirizine (ZYRTEC) 10 MG tablet, Take 10 mg by mouth daily., Disp: , Rfl:    clotrimazole -betamethasone  (LOTRISONE ) cream, APPLY 1-2 TIMES A DAY AS NEEDED FOR SPOT TREATMENT FOR YEAST SKIN RASH, Disp: 30 g, Rfl: 1   esomeprazole  (NEXIUM ) 20 MG capsule, Take 1 capsule (20 mg total) by mouth 2 (two) times daily before a meal., Disp: 180 capsule, Rfl: 1   fluticasone  (FLONASE ) 50 MCG/ACT nasal spray, Place 2 sprays into the nose at bedtime. , Disp: , Rfl:    Glucosamine-Chondroitin (COSAMIN DS PO), Take 1 tablet by mouth 2 (two) times daily., Disp: , Rfl:    glucose blood (ONETOUCH VERIO) test strip, USE TO CHECK BLOOD SUGAR TWICE A DAY. DX R73.03., Disp: 100 strip, Rfl: 1   Lancets Misc. MISC, Use to check blood sugar twice a day.  DX R73.03. May substitute to any  manufacturer covered by patient's insurance., Disp: 100 each, Rfl: 0   lidocaine -prilocaine  (EMLA ) cream, Apply to affected area once, Disp: 30 g, Rfl: 3   Multiple Vitamins-Minerals (MULTIVITAMIN WITH MINERALS) tablet, Take 1 tablet by mouth every evening. Women's One-A-Day, Disp: , Rfl:    nystatin  (MYCOSTATIN /NYSTOP ) powder, APPLY TOPICALLY 3 TIMES A DAY, Disp: 30 g, Rfl: 1   Omega-3 Fatty Acids (FISH OIL) 1000 MG CAPS, Take 1,000 mg by mouth daily., Disp: , Rfl:    ondansetron  (ZOFRAN ) 8 MG tablet, Take 1 tablet (8 mg total) by mouth every 8 (eight) hours as needed for nausea or vomiting., Disp: 30 tablet, Rfl: 1   pilocarpine  (SALAGEN ) 5 MG tablet, Take 1 tablet (5 mg total) by mouth 3 (three) times daily. For dry mouth from radiation (Patient taking differently: Take 5 mg by mouth 3 (three) times daily as needed. For dry mouth from radiation), Disp: 90 tablet, Rfl: 1   prochlorperazine  (COMPAZINE ) 10 MG tablet, Take 1 tablet (10 mg total) by mouth every 6 (six) hours as needed for nausea or vomiting., Disp: 30 tablet, Rfl: 1   TURMERIC PO, Take 538 mg by mouth daily., Disp: , Rfl:   Physical exam:  Vitals:   01/28/24 1308  BP: (!) 144/60  Pulse: 68  Resp: 18  Temp: (!) 96 F (35.6 C)  TempSrc: Tympanic  SpO2: 96%  Weight: 163 lb 8 oz (74.2 kg)  Height: 5\' 1"  (1.549 m)   Physical Exam Cardiovascular:     Rate and Rhythm: Normal rate and regular rhythm.     Heart sounds: Normal heart sounds.  Pulmonary:     Effort: Pulmonary effort is normal.     Breath sounds: Normal breath sounds.  Abdominal:     General: Bowel sounds are normal.     Palpations: Abdomen is soft.  Skin:    General: Skin is warm and dry.  Neurological:     Mental Status: She is alert and oriented to person, place, and time.      I have personally reviewed labs listed below:    Latest Ref Rng & Units 01/07/2024   10:27 AM  CMP  Glucose 70 - 99 mg/dL 782   BUN 8 - 23 mg/dL 17   Creatinine  0.44 - 1.00  mg/dL 1.61   Sodium 096 - 045 mmol/L 135   Potassium 3.5 - 5.1 mmol/L 3.9   Chloride 98 - 111 mmol/L 101   CO2 22 - 32 mmol/L 25   Calcium  8.9 - 10.3 mg/dL 8.8   Total Protein 6.5 - 8.1 g/dL 7.0   Total Bilirubin 0.0 - 1.2 mg/dL 0.6   Alkaline Phos 38 - 126 U/L 58   AST 15 - 41 U/L 23   ALT 0 - 44 U/L 17       Latest Ref Rng & Units 01/07/2024   10:27 AM  CBC  WBC 4.0 - 10.5 K/uL 5.8   Hemoglobin 12.0 - 15.0 g/dL 40.9   Hematocrit 81.1 - 46.0 % 42.4   Platelets 150 - 400 K/uL 170    I have personally reviewed Radiology images listed below: No images are attached to the encounter.  NM PET Image Restage (PS) Whole Body Result Date: 01/07/2024 CLINICAL DATA:  Subsequent treatment strategy for melanoma. EXAM: NUCLEAR MEDICINE PET WHOLE BODY TECHNIQUE: 8.6 mCi F-18 FDG was injected intravenously. Full-ring PET imaging was performed from the head to foot after the radiotracer. CT data was obtained and used for attenuation correction and anatomic localization. Fasting blood glucose: 104 mg/dl COMPARISON:  91/47/8295. FINDINGS: Mediastinal blood pool activity: SUV max 2.8 HEAD/NECK: Focal uptake along the left oropharynx, SUV max 6.5. CT appearance is unchanged from 06/11/2023. This area is collapsed on CT, limiting further evaluation. Interval resection of a previously seen right level II nodal mass. No hypermetabolic lymph nodes. Incidental CT findings: None. CHEST: No abnormal hypermetabolism. Small subpectoral and axillary lymph nodes are not hypermetabolic. Incidental CT findings: Right IJ Port-A-Cath terminates in the SVC. Atherosclerotic calcification of the aorta, aortic valve and coronary arteries. Heart is enlarged. No pericardial or pleural effusion. ABDOMEN/PELVIS: No abnormal hypermetabolism. Incidental CT findings: Small low-attenuation lesion in the left kidney. No specific follow-up necessary. SKELETON: Faint focal hypometabolism in the lateral right distal femur, SUV max 2.4, without a  CT correlate. No abnormal hypermetabolism. Incidental CT findings: Degenerative changes in the spine. EXTREMITIES: No abnormal hypermetabolism. Incidental CT findings: Right knee arthroplasty. IMPRESSION: 1. No evidence of hypermetabolic metastatic disease. 2. Focal uptake along the left oropharynx and within the distal right femur, without CT correlates. Recommend attention on follow-up. Electronically Signed   By: Shearon Denis M.D.   On: 01/07/2024 10:17     Assessment and plan- Patient is a 83 y.o. female with history of stage IIIb malignant melanoma T0 N1b M0.  She is here for on treatment assessment prior to cycle 8 of adjuvant Keytruda   Counts okay to proceed with cycle 8 of adjuvant Keytruda  today.  I will see her back in 3 weeks for cycle 9. Plan is to complete 1 year of adjuvant therapy.  Her recent PET scan from March 2025 did not show any evidence of hypermetabolic metastatic disease.  She has mild chronic erythematous skin rash involving her extremities which is overall stable.  Continue to monitor   Visit Diagnosis 1. Encounter for antineoplastic immunotherapy   2. Melanoma of skin (HCC)   3. Drug-induced skin rash      Dr. Seretha Dance, MD, MPH [facility] at [facility] [phone] 01/28/2024 1:02 PM

## 2024-01-28 NOTE — Patient Instructions (Signed)
 [facility] - A DEPT OF [facility]  Discharge Instructions: Thank you for choosing [facility] to provide your oncology and hematology care.  If you have a lab appointment with the Cancer Center, please go directly to the Cancer Center and check in at the registration area.  Wear comfortable clothing and clothing appropriate for easy access to any Portacath or PICC line.   We strive to give you quality time with your provider. You may need to reschedule your appointment if you arrive late (15 or more minutes).  Arriving late affects you and other patients whose appointments are after yours.  Also, if you miss three or more appointments without notifying the office, you may be dismissed from the clinic at the provider's discretion.      For prescription refill requests, have your pharmacy contact our office and allow 72 hours for refills to be completed.    Today you received the following chemotherapy and/or immunotherapy agents: KEYTRUDA    To help prevent nausea and vomiting after your treatment, we encourage you to take your nausea medication as directed.  BELOW ARE SYMPTOMS THAT SHOULD BE REPORTED IMMEDIATELY: *FEVER GREATER THAN 100.4 F (38 C) OR HIGHER *CHILLS OR SWEATING *NAUSEA AND VOMITING THAT IS NOT CONTROLLED WITH YOUR NAUSEA MEDICATION *UNUSUAL SHORTNESS OF BREATH *UNUSUAL BRUISING OR BLEEDING *URINARY PROBLEMS (pain or burning when urinating, or frequent urination) *BOWEL PROBLEMS (unusual diarrhea, constipation, pain near the anus) TENDERNESS IN MOUTH AND THROAT WITH OR WITHOUT PRESENCE OF ULCERS (sore throat, sores in mouth, or a toothache) UNUSUAL RASH, SWELLING OR PAIN  UNUSUAL VAGINAL DISCHARGE OR ITCHING   Items with * indicate a potential emergency and should be followed up as soon as possible or go to the Emergency Department if any problems should occur.  Please show the CHEMOTHERAPY ALERT CARD or IMMUNOTHERAPY  ALERT CARD at check-in to the Emergency Department and triage nurse.  Should you have questions after your visit or need to cancel or reschedule your appointment, please contact [facility] - A DEPT OF Eligha Bridegroom [facility]  [phone] and follow the prompts.  Office hours are 8:00 a.m. to 4:30 p.m. Monday - Friday. Please note that voicemails left after 4:00 p.m. may not be returned until the following business day.  We are closed weekends and major holidays. You have access to a nurse at all times for urgent questions. Please call the main number to the clinic [phone] and follow the prompts.  For any non-urgent questions, you may also contact your provider using MyChart. We now offer e-Visits for anyone 72 and older to request care online for non-urgent symptoms. For details visit [email or web address].   Also download the MyChart app! Go to the app store, search "MyChart", open the app, select [place], and log in with your MyChart username and password.

## 2024-02-10 DIAGNOSIS — L821 Other seborrheic keratosis: Secondary | ICD-10-CM | POA: Diagnosis not present

## 2024-02-10 DIAGNOSIS — L814 Other melanin hyperpigmentation: Secondary | ICD-10-CM | POA: Diagnosis not present

## 2024-02-10 DIAGNOSIS — D225 Melanocytic nevi of trunk: Secondary | ICD-10-CM | POA: Diagnosis not present

## 2024-02-10 DIAGNOSIS — S1086XA Insect bite of other specified part of neck, initial encounter: Secondary | ICD-10-CM | POA: Diagnosis not present

## 2024-02-11 ENCOUNTER — Other Ambulatory Visit: Payer: Self-pay | Admitting: Internal Medicine

## 2024-02-11 DIAGNOSIS — B372 Candidiasis of skin and nail: Secondary | ICD-10-CM

## 2024-02-12 ENCOUNTER — Encounter: Payer: Self-pay | Admitting: Oncology

## 2024-02-13 NOTE — Telephone Encounter (Signed)
 Requested medications are due for refill today.  yes  Requested medications are on the active medications list.  yes  Last refill. 05/16/2023 30g 1 rf  Future visit scheduled.   no  Notes to clinic.  Medication not assigned to a protocol. Please review for refill.    Requested Prescriptions  Pending Prescriptions Disp Refills   nystatin  (MYCOSTATIN /NYSTOP ) powder [Pharmacy Med Name: NYSTATIN  100,000 UNIT/GM POWD] 30 g 1    Sig: APPLY TO AFFECTED AREA 3 TIMES A DAY     Off-Protocol Failed - 02/13/2024  1:33 PM      Failed - Medication not assigned to a protocol, review manually.      Failed - Valid encounter within last 12 months    Recent Outpatient Visits           7 years ago Annual physical exam   Primary Care at [facility] Goods, MD   7 years ago Cerumen impaction, bilateral   Primary Care at [facility], MD   7 years ago Cough   Primary Care at [facility], Sable Cram, MD   8 years ago Left-sided low back pain with left-sided sciatica   [facility] at [facility], [facility]   8 years ago Palpitations   Primary Care at Edgar Goods, MD       Future Appointments             In 3 days Bronson Canny, PA-C [place]   In 4 weeks [place], Rankin Buzzard, NP [facility] [place]

## 2024-02-16 ENCOUNTER — Ambulatory Visit: Admitting: Physician Assistant

## 2024-02-16 ENCOUNTER — Other Ambulatory Visit (INDEPENDENT_AMBULATORY_CARE_PROVIDER_SITE_OTHER): Payer: Self-pay

## 2024-02-16 DIAGNOSIS — Z96651 Presence of right artificial knee joint: Secondary | ICD-10-CM

## 2024-02-16 DIAGNOSIS — M25561 Pain in right knee: Secondary | ICD-10-CM

## 2024-02-16 MED ORDER — LIDOCAINE HCL 1 % IJ SOLN
1.0000 mL | INTRAMUSCULAR | Status: AC | PRN
  Administered 2024-02-16: 1 mL

## 2024-02-16 MED ORDER — METHYLPREDNISOLONE ACETATE 40 MG/ML IJ SUSP
40.0000 mg | INTRAMUSCULAR | Status: AC | PRN
  Administered 2024-02-16: 40 mg via INTRAMUSCULAR

## 2024-02-16 NOTE — Progress Notes (Signed)
 HPI: [name] returns today due to right knee pain.  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  She states she is having pain lateral aspect of the knee weakness in the leg at times.  She is taking Tylenol  and use Voltaren  gel without any significant relief.  No known injury.  Pain has been ongoing for few months off and on.  She has been treated for malignant melanoma and is being followed with PET and CT scans.  Denies any fevers chills.  Prediabetic.  Review of systems: See HPI otherwise negative.  Physical exam: General Well-developed well-nourished female no acute distress.  No assistive device.  Ambulates with nonantalgic gait. Bilateral knees: Good range of motion without significant pain.  No instability valgus varus stressing of either knee.  Anterior drawer is negative bilaterally.  Right knee well-healed surgical incision.  No effusion of either knee.  Left knee tenderness over the pes anserinus region.  Right knee tender over the distal insertion of the IT band.  Radiographs: Right knee 2 views: Knee is well located.  No acute fracture or is.  Well-seated total knee arthroplasty without signs of complication.  Impression: Right knee pain  Plan: Given the tenderness at the insertion of the IT band would recommend injection for therapeutic and diagnostic.  She is agreeable.  Continue to work on quad strengthening both knees.  Continue Voltaren  gel.  Follow-up with us  as needed pain persist or becomes worse.    Procedures: Visit Diagnoses:  1. History of total right knee replacement     Trigger Point Inj  Date/Time: 02/16/2024 12:56 PM  Performed by: Bronson Canny, PA-C Authorized by: Bronson Canny, PA-C   Indications:  Pain Total # of Trigger Points:  1 Location: lower extremity   Needle Size:  25 G Approach:  Lateral Medications #1:  1 mL lidocaine  1 %; 40 mg methylPREDNISolone  acetate 40 MG/ML

## 2024-02-18 ENCOUNTER — Inpatient Hospital Stay: Attending: Oncology

## 2024-02-18 ENCOUNTER — Encounter: Payer: Self-pay | Admitting: Oncology

## 2024-02-18 ENCOUNTER — Inpatient Hospital Stay: Admitting: Oncology

## 2024-02-18 ENCOUNTER — Inpatient Hospital Stay

## 2024-02-18 VITALS — BP 145/68 | HR 66 | Temp 96.8°F | Resp 18 | Ht 61.0 in | Wt 163.9 lb

## 2024-02-18 VITALS — BP 154/64 | HR 64 | Temp 96.0°F | Resp 17

## 2024-02-18 DIAGNOSIS — Z85820 Personal history of malignant melanoma of skin: Secondary | ICD-10-CM | POA: Diagnosis not present

## 2024-02-18 DIAGNOSIS — Z5112 Encounter for antineoplastic immunotherapy: Secondary | ICD-10-CM | POA: Insufficient documentation

## 2024-02-18 DIAGNOSIS — C439 Malignant melanoma of skin, unspecified: Secondary | ICD-10-CM

## 2024-02-18 DIAGNOSIS — C770 Secondary and unspecified malignant neoplasm of lymph nodes of head, face and neck: Secondary | ICD-10-CM | POA: Diagnosis not present

## 2024-02-18 DIAGNOSIS — Z79899 Other long term (current) drug therapy: Secondary | ICD-10-CM | POA: Insufficient documentation

## 2024-02-18 LAB — CBC WITH DIFFERENTIAL (CANCER CENTER ONLY)
Abs Immature Granulocytes: 0.03 10*3/uL (ref 0.00–0.07)
Basophils Absolute: 0 10*3/uL (ref 0.0–0.1)
Basophils Relative: 0 %
Eosinophils Absolute: 0.1 10*3/uL (ref 0.0–0.5)
Eosinophils Relative: 1 %
HCT: 40.1 % (ref 36.0–46.0)
Hemoglobin: 13.3 g/dL (ref 12.0–15.0)
Immature Granulocytes: 0 %
Lymphocytes Relative: 26 %
Lymphs Abs: 2 10*3/uL (ref 0.7–4.0)
MCH: 30.4 pg (ref 26.0–34.0)
MCHC: 33.2 g/dL (ref 30.0–36.0)
MCV: 91.8 fL (ref 80.0–100.0)
Monocytes Absolute: 0.5 10*3/uL (ref 0.1–1.0)
Monocytes Relative: 7 %
Neutro Abs: 4.8 10*3/uL (ref 1.7–7.7)
Neutrophils Relative %: 66 %
Platelet Count: 169 10*3/uL (ref 150–400)
RBC: 4.37 MIL/uL (ref 3.87–5.11)
RDW: 13 % (ref 11.5–15.5)
WBC Count: 7.4 10*3/uL (ref 4.0–10.5)
nRBC: 0 % (ref 0.0–0.2)

## 2024-02-18 LAB — TSH: TSH: 2.18 u[IU]/mL (ref 0.350–4.500)

## 2024-02-18 LAB — CMP (CANCER CENTER ONLY)
ALT: 18 U/L (ref 0–44)
AST: 24 U/L (ref 15–41)
Albumin: 3.9 g/dL (ref 3.5–5.0)
Alkaline Phosphatase: 57 U/L (ref 38–126)
Anion gap: 10 (ref 5–15)
BUN: 15 mg/dL (ref 8–23)
CO2: 25 mmol/L (ref 22–32)
Calcium: 8.6 mg/dL — ABNORMAL LOW (ref 8.9–10.3)
Chloride: 101 mmol/L (ref 98–111)
Creatinine: 0.7 mg/dL (ref 0.44–1.00)
GFR, Estimated: >60 mL/min (ref >60)
Glucose, Bld: 104 mg/dL — ABNORMAL HIGH (ref 70–99)
Potassium: 3.9 mmol/L (ref 3.5–5.1)
Sodium: 136 mmol/L (ref 135–145)
Total Bilirubin: 0.7 mg/dL (ref 0.0–1.2)
Total Protein: 7.2 g/dL (ref 6.5–8.1)

## 2024-02-18 MED ORDER — SODIUM CHLORIDE 0.9 % IV SOLN
200.0000 mg | Freq: Once | INTRAVENOUS | Status: AC
  Administered 2024-02-18: 200 mg via INTRAVENOUS
  Filled 2024-02-18: qty 8

## 2024-02-18 MED ORDER — SODIUM CHLORIDE 0.9 % IV SOLN
INTRAVENOUS | Status: DC
  Filled 2024-02-18: qty 250

## 2024-02-18 MED ORDER — SODIUM CHLORIDE 0.9% FLUSH
10.0000 mL | INTRAVENOUS | Status: DC | PRN
Start: 2024-02-18 — End: 2024-02-18
  Filled 2024-02-18: qty 10

## 2024-02-18 MED ORDER — HEPARIN SOD (PORK) LOCK FLUSH 100 UNIT/ML IV SOLN
500.0000 [IU] | Freq: Once | INTRAVENOUS | Status: AC | PRN
Start: 2024-02-18 — End: 2024-02-18
  Administered 2024-02-18: 500 [IU]
  Filled 2024-02-18: qty 5

## 2024-02-18 NOTE — Patient Instructions (Signed)
 [facility]  Discharge Instructions: Thank you for choosing [facility] to provide your oncology and hematology care.  If you have a lab appointment with the Cancer Center, please go directly to the Cancer Center and check in at the registration area.  Wear comfortable clothing and clothing appropriate for easy access to any Portacath or PICC line.   We strive to give you quality time with your provider. You may need to reschedule your appointment if you arrive late (15 or more minutes).  Arriving late affects you and other patients whose appointments are after yours.  Also, if you miss three or more appointments without notifying the office, you may be dismissed from the clinic at the provider's discretion.      For prescription refill requests, have your pharmacy contact our office and allow 72 hours for refills to be completed.    Today you received the following chemotherapy and/or immunotherapy agents [name]      To help prevent nausea and vomiting after your treatment, we encourage you to take your nausea medication as directed.  BELOW ARE SYMPTOMS THAT SHOULD BE REPORTED IMMEDIATELY: *FEVER GREATER THAN 100.4 F (38 C) OR HIGHER *CHILLS OR SWEATING *NAUSEA AND VOMITING THAT IS NOT CONTROLLED WITH YOUR NAUSEA MEDICATION *UNUSUAL SHORTNESS OF BREATH *UNUSUAL BRUISING OR BLEEDING *URINARY PROBLEMS (pain or burning when urinating, or frequent urination) *BOWEL PROBLEMS (unusual diarrhea, constipation, pain near the anus) TENDERNESS IN MOUTH AND THROAT WITH OR WITHOUT PRESENCE OF ULCERS (sore throat, sores in mouth, or a toothache) UNUSUAL RASH, SWELLING OR PAIN  UNUSUAL VAGINAL DISCHARGE OR ITCHING   Items with * indicate a potential emergency and should be followed up as soon as possible or go to the Emergency Department if any problems should occur.  Please show the CHEMOTHERAPY ALERT CARD or IMMUNOTHERAPY  ALERT CARD at check-in to the Emergency Department and triage nurse.  Should you have questions after your visit or need to cancel or reschedule your appointment, please contact [facility] - A DEPT OF [facility]ide[facility]  [phone] and follow the prompts.  Office hours are 8:00 a.m. to 4:30 p.m. Monday - Friday. Please note that voicemails left after 4:00 p.m. may not be returned until the following business day.  We are closed weekends and major holidays. You have access to a nurse at all times for urgent questions. Please call the main number to the clinic [phone] and follow the prompts.  For any non-urgent questions, you may also contact your provider using MyChart. We now offer e-Visits for anyone 48 and older to request care online for non-urgent symptoms. For details visit [email or web address].   Also download the MyChart app! Go to the app store, search "MyChart", open the app, select [place], and log in with your MyChart username and password.

## 2024-02-19 ENCOUNTER — Encounter: Payer: Self-pay | Admitting: Oncology

## 2024-02-19 LAB — T4: T4, Total: 6.4 ug/dL (ref 4.5–12.0)

## 2024-02-19 NOTE — Progress Notes (Signed)
 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  This was followed by a PET CT scan on 06/11/2023 which showed a 2.8 cm right level 2 lymph node with an SUV of 31.  Peripheral nodular hypermetabolism compatible with irregular rim enhancement.  12 mm nodular component along the inferior aspect of the lesion measuring SUV 10.1.  No other evidence of distant metastatic disease.  Core biopsy showed necrosis but was not conclusive for malignancy.  She was then referred to [facility] and underwent definitive excision of the right cervical lymph node on 07/17/2023.  Pathology showed malignant neoplasm with extensive necrosis consistent with metastatic melanoma.  BRAF negative surgical margins negative.  Right level 2A, 2B, 3 and 4 lymph nodes and right tonsil as well as right retromolar trigone biopsy all negative for malignancy. She was staged as stage IIIB T0 N1b M0.  She received adjuvant radiation therapy in [facility].  Pros and cons of adjuvant immunotherapy discussed with the patient and patient chose to receive care closer to home in [place].  Interval history-tolerating Keytruda  well so far.  Skin rash has stabilized and getting better.  It does not bother her.  Denies other side effects from Keytruda   ECOG PS- 1 Pain scale- 0  Review of systems- Review of Systems  Constitutional:  Negative for chills,  fever, malaise/fatigue and weight loss.  HENT:  Negative for congestion, ear discharge and nosebleeds.   Eyes:  Negative for blurred vision.  Respiratory:  Negative for cough, hemoptysis, sputum production, shortness of breath and wheezing.   Cardiovascular:  Negative for chest pain, palpitations, orthopnea and claudication.  Gastrointestinal:  Negative for abdominal pain, blood in stool, constipation, diarrhea, heartburn, melena, nausea and vomiting.  Genitourinary:  Negative for dysuria, flank pain, frequency, hematuria and urgency.  Musculoskeletal:  Negative for back pain, joint pain and myalgias.  Skin:  Negative for rash.  Neurological:  Negative for dizziness, tingling, focal weakness, seizures, weakness and headaches.  Endo/Heme/Allergies:  Does not bruise/bleed easily.  Psychiatric/Behavioral:  Negative for depression and suicidal ideas. The patient does not have insomnia.       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   IR IMAGING GUIDED PORT INSERTION  08/21/2023   KNEE  ARTHROSCOPY W/ MENISCAL REPAIR Left    PARTIAL KNEE ARTHROPLASTY Right 09/16/2017   PARTIAL KNEE ARTHROPLASTY Right 09/16/2017   Procedure: RIGHT UNICOMPARTMENTAL KNEE;  Surgeon: Arnie Lao, MD;  Location: [facility] OR;  Service: Orthopedics;  Laterality: Right;   SHOULDER ARTHROSCOPY Left 11/2018   TOTAL KNEE REVISION Right 06/15/2019   Procedure: RIGHT UNICOMPARTMENTAL  ARTHROPLASTY TO TOTAL KNEE ARTHROPLASTY;  Surgeon: Arnie Lao, MD;  Location: [facility] OR;  Service: Orthopedics;  Laterality: Right;   TUBAL LIGATION      Social History   Socioeconomic History   Marital status: Married    Spouse name: Brody Hurry   Number of children: 2   Years of education: Not on file   Highest education level: 12th grade  Occupational History   Occupation: Retired  Tobacco Use   Smoking status: Former    Current packs/day: 0.00    Types: Cigarettes    Quit date: 12/28/1986    Years since quitting: 37.1   Smokeless tobacco: Never  Vaping Use   Vaping status: Never Used  Substance and Sexual Activity   Alcohol use: No   Drug use: No   Sexual activity: Not Currently  Other Topics Concern   Not on file  Social History Narrative   Not on file   Social Drivers of Health   Financial Resource Strain: Low Risk  (08/22/2023)   Overall Financial Resource Strain (CARDIA)    Difficulty of Paying Living Expenses: Not hard at all  Food Insecurity: No Food Insecurity (01/12/2024)   Received from [facility]   Hunger Vital Sign    Worried About Running Out of Food in the Last Year: Never true    Ran Out of Food in the Last Year: Never true  Transportation Needs: No Transportation Needs (01/12/2024)   Received from [facility]   PRAPARE - Transportation    Lack of Transportation (Medical): No    Lack of Transportation (Non-Medical): No  Physical Activity: Insufficiently Active (08/22/2023)   Exercise Vital Sign    Days of Exercise per Week: 4 days    Minutes of Exercise per Session: 30 min  Stress: No Stress Concern Present (08/22/2023)   [name]    Feeling of Stress : Not at all  Social Connections: Moderately Integrated (08/22/2023)   Social Connection and Isolation Panel [NHANES]    Frequency of Communication with Friends and Family: More than three times a week     Frequency of Social Gatherings with Friends and Family: Once a week    Attends Religious Services: More than 4 times per year    Active Member of [employer] or Organizations: No    Attends Banker Meetings: Never    Marital Status: Married  Catering manager Violence: Not At Risk (01/12/2024)   Received from [facility]   Humiliation, Afraid, Rape, and Kick questionnaire    Fear of Current or Ex-Partner: No    Emotionally Abused: No    Physically Abused: No    Sexually Abused: No    Family History  Problem Relation Age of Onset   Breast cancer Neg Hx      Current Outpatient Medications:    acetaminophen  (TYLENOL ) 325 MG tablet, Take by mouth., Disp: , Rfl:    aspirin  EC 81 MG tablet, Take 81 mg by mouth daily., Disp: , Rfl:    atorvastatin  (LIPITOR) 10 MG tablet, TAKE 1 TABLET BY MOUTH EVERY DAY,  Disp: 90 tablet, Rfl: 1   Blood Glucose Monitoring Suppl DEVI, Use to check blood sugar twice a day.  DX R73.03. May substitute to any manufacturer covered by patient's insurance., Disp: 1 each, Rfl: 0   Calcium  Carb-Cholecalciferol  600-800 MG-UNIT TABS, Take 1 tablet by mouth 2 (two) times daily. , Disp: , Rfl:    cetirizine (ZYRTEC) 10 MG tablet, Take 10 mg by mouth daily., Disp: , Rfl:    clotrimazole -betamethasone  (LOTRISONE ) cream, APPLY 1-2 TIMES A DAY AS NEEDED FOR SPOT TREATMENT FOR YEAST SKIN RASH, Disp: 30 g, Rfl: 1   esomeprazole  (NEXIUM ) 20 MG capsule, Take 1 capsule (20 mg total) by mouth 2 (two) times daily before a meal., Disp: 180 capsule, Rfl: 1   fluticasone  (FLONASE ) 50 MCG/ACT nasal spray, Place 2 sprays into the nose at bedtime. , Disp: , Rfl:    Glucosamine-Chondroitin (COSAMIN DS PO), Take 1 tablet by mouth 2 (two) times daily., Disp: , Rfl:    glucose blood (ONETOUCH VERIO) test strip, USE TO CHECK BLOOD SUGAR TWICE A DAY. DX R73.03., Disp: 100 strip, Rfl: 1   Lancets Misc. MISC, Use to check blood sugar twice a day.  DX R73.03. May substitute to any  manufacturer covered by patient's insurance., Disp: 100 each, Rfl: 0   lidocaine  (XYLOCAINE ) 2 % solution, 10 mLs., Disp: , Rfl:    lidocaine -prilocaine  (EMLA ) cream, Apply to affected area once, Disp: 30 g, Rfl: 3   Multiple Vitamins-Minerals (MULTIVITAMIN WITH MINERALS) tablet, Take 1 tablet by mouth every evening. Women's One-A-Day, Disp: , Rfl:    nystatin  (MYCOSTATIN /NYSTOP ) powder, APPLY TO AFFECTED AREA 3 TIMES A DAY, Disp: 30 g, Rfl: 1   Omega-3 Fatty Acids (FISH OIL) 1000 MG CAPS, Take 1,000 mg by mouth daily., Disp: , Rfl:    ondansetron  (ZOFRAN ) 8 MG tablet, Take 1 tablet (8 mg total) by mouth every 8 (eight) hours as needed for nausea or vomiting., Disp: 30 tablet, Rfl: 1   pilocarpine  (SALAGEN ) 5 MG tablet, Take 1 tablet (5 mg total) by mouth 3 (three) times daily. For dry mouth from radiation (Patient taking differently: Take 5 mg by mouth 3 (three) times daily as needed. For dry mouth from radiation), Disp: 90 tablet, Rfl: 1   prochlorperazine  (COMPAZINE ) 10 MG tablet, Take 1 tablet (10 mg total) by mouth every 6 (six) hours as needed for nausea or vomiting., Disp: 30 tablet, Rfl: 1   TURMERIC PO, Take 538 mg by mouth daily., Disp: , Rfl:   Physical exam:  Vitals:   02/18/24 0949  BP: (!) 145/68  Pulse: 66  Resp: 18  Temp: (!) 96.8 F (36 C)  TempSrc: Tympanic  SpO2: 97%  Weight: 163 lb 14.4 oz (74.3 kg)  Height: 5\' 1"  (1.549 m)   Physical Exam Cardiovascular:     Rate and Rhythm: Normal rate and regular rhythm.     Heart sounds: Normal heart sounds.  Pulmonary:     Effort: Pulmonary effort is normal.     Breath sounds: Normal breath sounds.  Skin:    General: Skin is warm and dry.  Neurological:     Mental Status: She is alert and oriented to person, place, and time.      I have personally reviewed labs listed below:    Latest Ref Rng & Units 02/18/2024    9:24 AM  CMP  Glucose 70 - 99 mg/dL 308   BUN 8 - 23 mg/dL 15   Creatinine 6.57 - 1.00 mg/dL  0.70    Sodium 135 - 145 mmol/L 136   Potassium 3.5 - 5.1 mmol/L 3.9   Chloride 98 - 111 mmol/L 101   CO2 22 - 32 mmol/L 25   Calcium  8.9 - 10.3 mg/dL 8.6   Total Protein 6.5 - 8.1 g/dL 7.2   Total Bilirubin 0.0 - 1.2 mg/dL 0.7   Alkaline Phos 38 - 126 U/L 57   AST 15 - 41 U/L 24   ALT 0 - 44 U/L 18       Latest Ref Rng & Units 02/18/2024    9:24 AM  CBC  WBC 4.0 - 10.5 K/uL 7.4   Hemoglobin 12.0 - 15.0 g/dL 16.1   Hematocrit 09.6 - 46.0 % 40.1   Platelets 150 - 400 K/uL 169    I have personally reviewed Radiology images listed below: No images are attached to the encounter.  XR Knee 1-2 Views Right Result Date: 02/16/2024 Right knee 2 views: Knee is well located.  No acute fracture or is.  Well-seated total knee arthroplasty without signs of complication.    Assessment and plan- Patient is a 83 y.o. 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  She is here for on treatment assessment prior to cycle 9 of adjuvant Keytruda .  Counts okay to proceed with cycle  9 of Keytruda  today and I will see her back in 3 weeks for cycle 10.  Scans from March 2025 did not show any evidence of recurrent or progressive disease.  I am planning to get scans every 6 months.  She also has CT of the neck scheduled at [facility] next month   Visit Diagnosis 1. Melanoma of skin (HCC)   2. Encounter for antineoplastic immunotherapy      Dr. Seretha Dance, MD, MPH [facility] [phone] 02/19/2024 9:17 AM

## 2024-03-03 ENCOUNTER — Other Ambulatory Visit: Payer: Self-pay | Admitting: Internal Medicine

## 2024-03-03 DIAGNOSIS — E782 Mixed hyperlipidemia: Secondary | ICD-10-CM

## 2024-03-05 NOTE — Telephone Encounter (Signed)
 Requested Prescriptions  Pending Prescriptions Disp Refills   atorvastatin  (LIPITOR) 10 MG tablet [Pharmacy Med Name: ATORVASTATIN  10 MG TABLET] 90 tablet 1    Sig: TAKE 1 TABLET BY MOUTH EVERY DAY     Cardiovascular:  Antilipid - Statins Failed - 03/05/2024  3:31 PM      Failed - Valid encounter within last 12 months    Recent Outpatient Visits           7 years ago Annual physical exam   [facility] at [facility], MD   8 years ago Cerumen impaction, bilateral   [facility] at [facility], MD   8 years ago Cough   [facility] at [facility], Sable Cram, MD   8 years ago Left-sided low back pain with left-sided sciatica   [facility], MD   8 years ago Palpitations   [facility] at [facility], Loyal Ruffing, MD       Future Appointments             In 1 week Baity, Rankin Buzzard, NP [facility]            Failed - Lipid Panel in normal range within the last 12 months    Cholesterol  Date Value Ref Range Status  09/12/2023 136 <200 mg/dL Final   LDL Cholesterol (Calc)  Date Value Ref Range Status  09/12/2023 64 mg/dL (calc) Final    Comment:    Reference range: <100 . Desirable range <100 mg/dL for primary prevention;   <70 mg/dL for patients with CHD or diabetic patients  with > or = 2 CHD risk factors. Aaron Aas LDL-C is now calculated using the Martin-Hopkins  calculation, which is a validated novel method providing  better accuracy than the Friedewald equation in the  estimation of LDL-C.  Melinda Sprawls et al. Erroll Heard. 1610;960(45): 2061-2068  (http://education.[employer].com/faq/FAQ164)    HDL  Date Value Ref Range Status  09/12/2023 53 > OR = 50 mg/dL Final   Triglycerides  Date Value Ref Range Status  09/12/2023 100 <150 mg/dL Final         Passed - Patient is not pregnant

## 2024-03-08 ENCOUNTER — Encounter: Payer: Self-pay | Admitting: Oncology

## 2024-03-10 ENCOUNTER — Inpatient Hospital Stay

## 2024-03-10 ENCOUNTER — Inpatient Hospital Stay: Attending: Oncology

## 2024-03-10 ENCOUNTER — Encounter: Payer: Self-pay | Admitting: Oncology

## 2024-03-10 ENCOUNTER — Other Ambulatory Visit: Payer: Self-pay | Admitting: Internal Medicine

## 2024-03-10 ENCOUNTER — Inpatient Hospital Stay (HOSPITAL_BASED_OUTPATIENT_CLINIC_OR_DEPARTMENT_OTHER): Admitting: Oncology

## 2024-03-10 VITALS — BP 150/63 | HR 70 | Temp 97.1°F | Resp 16

## 2024-03-10 VITALS — BP 141/69 | HR 60 | Temp 97.3°F | Resp 16 | Wt 164.0 lb

## 2024-03-10 DIAGNOSIS — C439 Malignant melanoma of skin, unspecified: Secondary | ICD-10-CM

## 2024-03-10 DIAGNOSIS — C770 Secondary and unspecified malignant neoplasm of lymph nodes of head, face and neck: Secondary | ICD-10-CM | POA: Diagnosis not present

## 2024-03-10 DIAGNOSIS — Z5112 Encounter for antineoplastic immunotherapy: Secondary | ICD-10-CM

## 2024-03-10 DIAGNOSIS — Z79899 Other long term (current) drug therapy: Secondary | ICD-10-CM | POA: Insufficient documentation

## 2024-03-10 LAB — CMP (CANCER CENTER ONLY)
ALT: 23 U/L (ref 0–44)
AST: 28 U/L (ref 15–41)
Albumin: 3.6 g/dL (ref 3.5–5.0)
Alkaline Phosphatase: 69 U/L (ref 38–126)
Anion gap: 9 (ref 5–15)
BUN: 25 mg/dL — ABNORMAL HIGH (ref 8–23)
CO2: 24 mmol/L (ref 22–32)
Calcium: 8.7 mg/dL — ABNORMAL LOW (ref 8.9–10.3)
Chloride: 104 mmol/L (ref 98–111)
Creatinine: 0.63 mg/dL (ref 0.44–1.00)
GFR, Estimated: >60 mL/min (ref >60)
Glucose, Bld: 107 mg/dL — ABNORMAL HIGH (ref 70–99)
Potassium: 4 mmol/L (ref 3.5–5.1)
Sodium: 137 mmol/L (ref 135–145)
Total Bilirubin: 0.8 mg/dL (ref 0.0–1.2)
Total Protein: 6.8 g/dL (ref 6.5–8.1)

## 2024-03-10 LAB — CBC WITH DIFFERENTIAL (CANCER CENTER ONLY)
Abs Immature Granulocytes: 0.02 10*3/uL (ref 0.00–0.07)
Basophils Absolute: 0 10*3/uL (ref 0.0–0.1)
Basophils Relative: 0 %
Eosinophils Absolute: 0.2 10*3/uL (ref 0.0–0.5)
Eosinophils Relative: 2 %
HCT: 40.1 % (ref 36.0–46.0)
Hemoglobin: 13.2 g/dL (ref 12.0–15.0)
Immature Granulocytes: 0 %
Lymphocytes Relative: 28 %
Lymphs Abs: 2.3 10*3/uL (ref 0.7–4.0)
MCH: 30.5 pg (ref 26.0–34.0)
MCHC: 32.9 g/dL (ref 30.0–36.0)
MCV: 92.6 fL (ref 80.0–100.0)
Monocytes Absolute: 0.7 10*3/uL (ref 0.1–1.0)
Monocytes Relative: 8 %
Neutro Abs: 5.2 10*3/uL (ref 1.7–7.7)
Neutrophils Relative %: 62 %
Platelet Count: 144 10*3/uL — ABNORMAL LOW (ref 150–400)
RBC: 4.33 MIL/uL (ref 3.87–5.11)
RDW: 12.8 % (ref 11.5–15.5)
WBC Count: 8.4 10*3/uL (ref 4.0–10.5)
nRBC: 0 % (ref 0.0–0.2)

## 2024-03-10 MED ORDER — SODIUM CHLORIDE 0.9 % IV SOLN
200.0000 mg | Freq: Once | INTRAVENOUS | Status: AC
  Administered 2024-03-10: 200 mg via INTRAVENOUS
  Filled 2024-03-10: qty 8

## 2024-03-10 MED ORDER — HEPARIN SOD (PORK) LOCK FLUSH 100 UNIT/ML IV SOLN
500.0000 [IU] | Freq: Once | INTRAVENOUS | Status: AC | PRN
  Administered 2024-03-10: 500 [IU]
  Filled 2024-03-10: qty 5

## 2024-03-10 MED ORDER — SODIUM CHLORIDE 0.9 % IV SOLN
INTRAVENOUS | Status: DC
  Filled 2024-03-10: qty 250

## 2024-03-10 MED ORDER — SODIUM CHLORIDE 0.9% FLUSH
10.0000 mL | INTRAVENOUS | Status: DC | PRN
  Filled 2024-03-10: qty 10

## 2024-03-10 MED ORDER — SODIUM CHLORIDE 0.9% FLUSH
10.0000 mL | INTRAVENOUS | Status: DC | PRN
  Administered 2024-03-10: 10 mL
  Filled 2024-03-10: qty 10

## 2024-03-10 NOTE — Patient Instructions (Signed)
 [facility]  Discharge Instructions: Thank you for choosing [facility] to provide your oncology and hematology care.  If you have a lab appointment with the Cancer Center, please go directly to the Cancer Center and check in at the registration area.  Wear comfortable clothing and clothing appropriate for easy access to any Portacath or PICC line.   We strive to give you quality time with your provider. You may need to reschedule your appointment if you arrive late (15 or more minutes).  Arriving late affects you and other patients whose appointments are after yours.  Also, if you miss three or more appointments without notifying the office, you may be dismissed from the clinic at the provider's discretion.      For prescription refill requests, have your pharmacy contact our office and allow 72 hours for refills to be completed.    Today you received the following chemotherapy and/or immunotherapy agents [name]      To help prevent nausea and vomiting after your treatment, we encourage you to take your nausea medication as directed.  BELOW ARE SYMPTOMS THAT SHOULD BE REPORTED IMMEDIATELY: *FEVER GREATER THAN 100.4 F (38 C) OR HIGHER *CHILLS OR SWEATING *NAUSEA AND VOMITING THAT IS NOT CONTROLLED WITH YOUR NAUSEA MEDICATION *UNUSUAL SHORTNESS OF BREATH *UNUSUAL BRUISING OR BLEEDING *URINARY PROBLEMS (pain or burning when urinating, or frequent urination) *BOWEL PROBLEMS (unusual diarrhea, constipation, pain near the anus) TENDERNESS IN MOUTH AND THROAT WITH OR WITHOUT PRESENCE OF ULCERS (sore throat, sores in mouth, or a toothache) UNUSUAL RASH, SWELLING OR PAIN  UNUSUAL VAGINAL DISCHARGE OR ITCHING   Items with * indicate a potential emergency and should be followed up as soon as possible or go to the Emergency Department if any problems should occur.  Please show the CHEMOTHERAPY ALERT CARD or IMMUNOTHERAPY  ALERT CARD at check-in to the Emergency Department and triage nurse.  Should you have questions after your visit or need to cancel or reschedule your appointment, please contact [facility] - A DEPT OF [facility]ide[facility]  [phone] and follow the prompts.  Office hours are 8:00 a.m. to 4:30 p.m. Monday - Friday. Please note that voicemails left after 4:00 p.m. may not be returned until the following business day.  We are closed weekends and major holidays. You have access to a nurse at all times for urgent questions. Please call the main number to the clinic [phone] and follow the prompts.  For any non-urgent questions, you may also contact your provider using MyChart. We now offer e-Visits for anyone 48 and older to request care online for non-urgent symptoms. For details visit [email or web address].   Also download the MyChart app! Go to the app store, search "MyChart", open the app, select [place], and log in with your MyChart username and password.

## 2024-03-10 NOTE — Progress Notes (Signed)
 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 ENT for concerns of neck swelling in August 2024.  This was followed by a PET CT scan on 06/11/2023 which showed a 2.8 cm right level 2 lymph node with an SUV of 31.  Peripheral nodular hypermetabolism compatible with irregular rim enhancement.  12 mm nodular component along the inferior aspect of the lesion measuring SUV 10.1.  No other evidence of distant metastatic disease.  Core biopsy showed necrosis but was not conclusive for malignancy.  She was then referred to [facility] and underwent definitive excision of the right cervical lymph node on 07/17/2023.  Pathology showed malignant neoplasm with extensive necrosis consistent with metastatic melanoma.  BRAF negative surgical margins negative.  Right level 2A, 2B, 3 and 4 lymph nodes and right tonsil as well as right retromolar trigone biopsy all negative for malignancy. She was staged as stage IIIB T0 N1b M0.  She received adjuvant radiation therapy in [facility].  Pros and cons of adjuvant immunotherapy discussed with the patient and patient chose to receive care closer to home in [place].  Interval history-tolerating Keytruda  well so far without any nausea vomiting or diarrhea.  Mild skin rash over bilateral forearms which does not seem to bother her  ECOG PS- 1 Pain scale- 0   Review of systems- Review of Systems  Constitutional:   Negative for chills, fever, malaise/fatigue and weight loss.  HENT:  Negative for congestion, ear discharge and nosebleeds.   Eyes:  Negative for blurred vision.  Respiratory:  Negative for cough, hemoptysis, sputum production, shortness of breath and wheezing.   Cardiovascular:  Negative for chest pain, palpitations, orthopnea and claudication.  Gastrointestinal:  Negative for abdominal pain, blood in stool, constipation, diarrhea, heartburn, melena, nausea and vomiting.  Genitourinary:  Negative for dysuria, flank pain, frequency, hematuria and urgency.  Musculoskeletal:  Negative for back pain, joint pain and myalgias.  Skin:  Negative for rash.  Neurological:  Negative for dizziness, tingling, focal weakness, seizures, weakness and headaches.  Endo/Heme/Allergies:  Does not bruise/bleed easily.  Psychiatric/Behavioral:  Negative for depression and suicidal ideas. The patient does not have insomnia.       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   IR IMAGING GUIDED PORT INSERTION  08/21/2023   KNEE ARTHROSCOPY W/ MENISCAL REPAIR Left    PARTIAL KNEE ARTHROPLASTY Right 09/16/2017   PARTIAL KNEE ARTHROPLASTY Right 09/16/2017   Procedure: RIGHT UNICOMPARTMENTAL KNEE;  Surgeon: Arnie Lao, MD;  Location: [facility] OR;  Service: Orthopedics;  Laterality: Right;   SHOULDER ARTHROSCOPY Left 11/2018   TOTAL KNEE REVISION Right 06/15/2019   Procedure: RIGHT  UNICOMPARTMENTAL ARTHROPLASTY TO TOTAL KNEE ARTHROPLASTY;  Surgeon: Arnie Lao, MD;  Location: [facility] OR;  Service: Orthopedics;  Laterality: Right;   TUBAL LIGATION      Social History   Socioeconomic History   Marital status: Married    Spouse name: Fallyn Munnerlyn   Number of children: 2   Years of education: Not on file   Highest education level: 12th grade  Occupational History   Occupation: Retired  Tobacco Use   Smoking status: Former    Current packs/day: 0.00    Types: Cigarettes    Quit date: 12/28/1986    Years since quitting: 37.2   Smokeless tobacco: Never  Vaping Use   Vaping status: Never Used  Substance and Sexual Activity   Alcohol use: No   Drug use: No   Sexual activity: Not Currently  Other Topics Concern   Not on file  Social History Narrative   Not on file   Social Drivers of Health   Financial Resource Strain: Low Risk  (08/22/2023)   Overall Financial Resource Strain (CARDIA)    Difficulty of Paying Living Expenses: Not hard at all  Food Insecurity: No Food Insecurity (01/12/2024)   Received from [facility]   Hunger Vital Sign    Worried About Running Out of Food in the Last Year: Never true    Ran Out of Food in the Last Year: Never true  Transportation Needs: No Transportation Needs (01/12/2024)   Received from [facility]   PRAPARE - Transportation    Lack of Transportation (Medical): No    Lack of Transportation (Non-Medical): No  Physical Activity: Insufficiently Active (08/22/2023)   Exercise Vital Sign    Days of Exercise per Week: 4 days    Minutes of Exercise per Session: 30 min  Stress: No Stress Concern Present (08/22/2023)   [name]    Feeling of Stress : Not at all  Social Connections: Moderately Integrated (08/22/2023)   Social Connection and Isolation Panel [NHANES]    Frequency of Communication with Friends and Family: More than three times a  week    Frequency of Social Gatherings with Friends and Family: Once a week    Attends Religious Services: More than 4 times per year    Active Member of [employer] or Organizations: No    Attends Banker Meetings: Never    Marital Status: Married  Catering manager Violence: Not At Risk (01/12/2024)   Received from [facility]   Humiliation, Afraid, Rape, and Kick questionnaire    Fear of Current or Ex-Partner: No    Emotionally Abused: No    Physically Abused: No    Sexually Abused: No    Family History  Problem Relation Age of Onset   Breast cancer Neg Hx      Current Outpatient Medications:    acetaminophen  (TYLENOL ) 325 MG tablet, Take by mouth., Disp: , Rfl:    aspirin  EC 81 MG tablet, Take 81 mg by mouth daily., Disp: , Rfl:    atorvastatin  (LIPITOR) 10 MG tablet, TAKE 1 TABLET  BY MOUTH EVERY DAY, Disp: 90 tablet, Rfl: 1   Blood Glucose Monitoring Suppl DEVI, Use to check blood sugar twice a day.  DX R73.03. May substitute to any manufacturer covered by patient's insurance., Disp: 1 each, Rfl: 0   Calcium  Carb-Cholecalciferol  600-800 MG-UNIT TABS, Take 1 tablet by mouth 2 (two) times daily. , Disp: , Rfl:    cetirizine (ZYRTEC) 10 MG tablet, Take 10 mg by mouth daily., Disp: , Rfl:    clotrimazole -betamethasone  (LOTRISONE ) cream, APPLY 1-2 TIMES A DAY AS NEEDED FOR SPOT TREATMENT FOR YEAST SKIN RASH, Disp: 30 g, Rfl: 1   esomeprazole  (NEXIUM ) 20 MG capsule, Take 1 capsule (20 mg total) by mouth 2 (two) times daily before a meal., Disp: 180 capsule, Rfl: 1   fluticasone  (FLONASE ) 50 MCG/ACT nasal spray, Place 2 sprays into the nose at bedtime. , Disp: , Rfl:    Glucosamine-Chondroitin (COSAMIN DS PO), Take 1 tablet by mouth 2 (two) times daily., Disp: , Rfl:    glucose blood (ONETOUCH VERIO) test strip, USE TO CHECK BLOOD SUGAR TWICE A DAY. DX R73.03., Disp: 100 strip, Rfl: 1   Lancets Misc. MISC, Use to check blood sugar twice a day.  DX R73.03. May substitute to any  manufacturer covered by patient's insurance., Disp: 100 each, Rfl: 0   lidocaine  (XYLOCAINE ) 2 % solution, 10 mLs., Disp: , Rfl:    lidocaine -prilocaine  (EMLA ) cream, Apply to affected area once, Disp: 30 g, Rfl: 3   Multiple Vitamins-Minerals (MULTIVITAMIN WITH MINERALS) tablet, Take 1 tablet by mouth every evening. Women's One-A-Day, Disp: , Rfl:    nystatin  (MYCOSTATIN /NYSTOP ) powder, APPLY TO AFFECTED AREA 3 TIMES A DAY, Disp: 30 g, Rfl: 1   Omega-3 Fatty Acids (FISH OIL) 1000 MG CAPS, Take 1,000 mg by mouth daily., Disp: , Rfl:    ondansetron  (ZOFRAN ) 8 MG tablet, Take 1 tablet (8 mg total) by mouth every 8 (eight) hours as needed for nausea or vomiting., Disp: 30 tablet, Rfl: 1   pilocarpine  (SALAGEN ) 5 MG tablet, Take 1 tablet (5 mg total) by mouth 3 (three) times daily. For dry mouth from radiation (Patient taking differently: Take 5 mg by mouth 3 (three) times daily as needed. For dry mouth from radiation), Disp: 90 tablet, Rfl: 1   prochlorperazine  (COMPAZINE ) 10 MG tablet, Take 1 tablet (10 mg total) by mouth every 6 (six) hours as needed for nausea or vomiting., Disp: 30 tablet, Rfl: 1   TURMERIC PO, Take 538 mg by mouth daily., Disp: , Rfl:  No current facility-administered medications for this visit.  Facility-Administered Medications Ordered in Other Visits:    0.9 %  sodium chloride  infusion, , Intravenous, Continuous, Avonne Boettcher, MD, Last Rate: 10 mL/hr at 03/10/24 0938, New Bag at 03/10/24 0938   heparin  lock flush 100 unit/mL, 500 Units, Intracatheter, Once PRN, Avonne Boettcher, MD   pembrolizumab  (KEYTRUDA ) 200 mg in sodium chloride  0.9 % 50 mL chemo infusion, 200 mg, Intravenous, Once, Avonne Boettcher, MD, Last Rate: 116 mL/hr at 03/10/24 1048, 200 mg at 03/10/24 1048   sodium chloride  flush (NS) 0.9 % injection 10 mL, 10 mL, Intracatheter, PRN, Avonne Boettcher, MD  Physical exam:  Vitals:   03/10/24 1000  BP: (!) 141/69  Pulse: 60  Resp: 16  Temp: (!) 97.3 F (36.3 C)   TempSrc: Tympanic  SpO2: 97%  Weight: 164 lb (74.4 kg)   Physical Exam Cardiovascular:     Rate and Rhythm: Normal rate and regular  rhythm.     Heart sounds: Normal heart sounds.  Pulmonary:     Effort: Pulmonary effort is normal.     Breath sounds: Normal breath sounds.  Skin:    General: Skin is warm and dry.  Neurological:     Mental Status: She is alert and oriented to person, place, and time.      I have personally reviewed labs listed below:    Latest Ref Rng & Units 03/10/2024    8:56 AM  CMP  Glucose 70 - 99 mg/dL 578   BUN 8 - 23 mg/dL 25   Creatinine 4.69 - 1.00 mg/dL 6.29   Sodium 528 - 413 mmol/L 137   Potassium 3.5 - 5.1 mmol/L 4.0   Chloride 98 - 111 mmol/L 104   CO2 22 - 32 mmol/L 24   Calcium  8.9 - 10.3 mg/dL 8.7   Total Protein 6.5 - 8.1 g/dL 6.8   Total Bilirubin 0.0 - 1.2 mg/dL 0.8   Alkaline Phos 38 - 126 U/L 69   AST 15 - 41 U/L 28   ALT 0 - 44 U/L 23       Latest Ref Rng & Units 03/10/2024    8:56 AM  CBC  WBC 4.0 - 10.5 K/uL 8.4   Hemoglobin 12.0 - 15.0 g/dL 24.4   Hematocrit 01.0 - 46.0 % 40.1   Platelets 150 - 400 K/uL 144    I have personally reviewed Radiology images listed below: No images are attached to the encounter.  XR Knee 1-2 Views Right Result Date: 02/16/2024 Right knee 2 views: Knee is well located.  No acute fracture or is.  Well-seated total knee arthroplasty without signs of complication.    Assessment and plan- Patient is a 83 y.o. 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 She is here for on treatment assessment prior to cycle 10 of adjuvant keytruda   Counts are ok to proceed with cycle 10 of adjuvant keytruda  today. I will see her in 3 weeks for cycle 11.  Plan is to complete 1 year of adjuvant Keytruda  ending in November 2025.  Patient had CT chest and [facility]Neck at [facility] in April 2025.  CT chest showed right upper lobe micronodules and 0.5 cm solid right lower lobe nodule which was  nonspecific but a repeat CT was recommended in 3 months which will be coordinated at [facility].  CT neck showed level 4 left lymph node measuring 0.6 cm in size which had increased in size as compared to 0.3 cm.  This was biopsied back in February 2025 and was negative for malignancy.   Visit Diagnosis 1. Melanoma of skin (HCC)   2. Encounter for antineoplastic immunotherapy      Dr. Seretha Dance, MD, MPH [facility] at [facility] [phone] 03/10/2024 10:56 AM

## 2024-03-10 NOTE — Progress Notes (Signed)
 Patient is doing well, no new questions or concerns for the doctor today.

## 2024-03-11 NOTE — Telephone Encounter (Signed)
 Requested Prescriptions  Pending Prescriptions Disp Refills   esomeprazole  (NEXIUM ) 20 MG capsule [Pharmacy Med Name: ESOMEPRAZOLE  MAG DR 20 MG CAP] 180 capsule 0    Sig: TAKE 1 CAPSULE (20 MG TOTAL) BY MOUTH 2 (TWO) TIMES DAILY BEFORE A MEAL.     Gastroenterology: Proton Pump Inhibitors 2 Failed - 03/11/2024 10:04 AM      Failed - Valid encounter within last 12 months    Recent Outpatient Visits           7 years ago Annual physical exam   [facility], Loyal Ruffing, MD   8 years ago Cerumen impaction, bilateral   [facility], [facility]   8 years ago Cough   [facility], Sable Cram, MD   8 years ago Left-sided low back pain with left-sided sciatica   [facility], [facility]   8 years ago Palpitations   [facility], Loyal Ruffing, MD       Future Appointments             Tomorrow Carollynn Cirri, NP [facility]            Passed - ALT in normal range and within 360 days    ALT  Date Value Ref Range Status  03/10/2024 23 0 - 44 U/L Final         Passed - AST in normal range and within 360 days    AST  Date Value Ref Range Status  03/10/2024 28 15 - 41 U/L Final

## 2024-03-12 ENCOUNTER — Ambulatory Visit (INDEPENDENT_AMBULATORY_CARE_PROVIDER_SITE_OTHER): Payer: Self-pay | Admitting: Internal Medicine

## 2024-03-12 ENCOUNTER — Encounter: Payer: Self-pay | Admitting: Internal Medicine

## 2024-03-12 VITALS — BP 130/70 | Ht 61.0 in | Wt 163.8 lb

## 2024-03-12 DIAGNOSIS — M150 Primary generalized (osteo)arthritis: Secondary | ICD-10-CM | POA: Diagnosis not present

## 2024-03-12 DIAGNOSIS — E782 Mixed hyperlipidemia: Secondary | ICD-10-CM

## 2024-03-12 DIAGNOSIS — R7303 Prediabetes: Secondary | ICD-10-CM

## 2024-03-12 DIAGNOSIS — E6609 Other obesity due to excess calories: Secondary | ICD-10-CM

## 2024-03-12 DIAGNOSIS — N3281 Overactive bladder: Secondary | ICD-10-CM

## 2024-03-12 DIAGNOSIS — K219 Gastro-esophageal reflux disease without esophagitis: Secondary | ICD-10-CM

## 2024-03-12 DIAGNOSIS — C439 Malignant melanoma of skin, unspecified: Secondary | ICD-10-CM | POA: Diagnosis not present

## 2024-03-12 DIAGNOSIS — E66811 Obesity, class 1: Secondary | ICD-10-CM

## 2024-03-12 DIAGNOSIS — Z6830 Body mass index (BMI) 30.0-30.9, adult: Secondary | ICD-10-CM | POA: Diagnosis not present

## 2024-03-12 NOTE — Assessment & Plan Note (Signed)
Will check A1c at annual exam Encouraged low-carb diet and exercise for weight loss

## 2024-03-12 NOTE — Progress Notes (Signed)
 Subjective:    Patient ID: [name], female    DOB: 1941/05/12, 83 y.o.   MRN: [id number]  HPI  Patient presents to clinic today for 49-month follow-up of chronic conditions.  OA: Mainly in her hands and knees.  She takes glucosamine chondroitin and turmeric OTC.  She follows with orthopedics.  GERD: Triggered by spicy foods.  She has occasional breakthrough on esomeprazole  for which she takes tums as needed with good relief of symptoms.  Upper GI from 05/2018 reviewed.  OAB: She reports mainly urinary urgency and frequency.  She is not currently taking any medications for this.  She gets botox as needed.  She follows with urology.  HLD: Her last LDL was 64, triglycerides 130, 09/2023.  She denies myalgias on atorvastatin  and is taking fish oil OTC.  She tries to consume low-fat diet.  Prediabetes: Her last A1c was 5.8%, 09/2023.  She is not taking any oral diabetic medication at this time.  She does not check her sugars.  Melanoma: s/p surgical intervention, radiation and immunotherapy. She is prescribed pilocarpine  but she does not take this. She follows with oncology.  Review of Systems     Past Medical History:  Diagnosis Date   Allergy    Arthritis    Cancer (HCC)    History of chicken pox    History of colon polyps    History of mouth cancer    Osteoarthritis of knee    Medial compartment, Right    Current Outpatient Medications  Medication Sig Dispense Refill   acetaminophen  (TYLENOL ) 325 MG tablet Take by mouth.     aspirin  EC 81 MG tablet Take 81 mg by mouth daily.     atorvastatin  (LIPITOR) 10 MG tablet TAKE 1 TABLET BY MOUTH EVERY DAY 90 tablet 1   Blood Glucose Monitoring Suppl DEVI Use to check blood sugar twice a day.  DX R73.03. May substitute to any manufacturer covered by patient's insurance. 1 each 0   Calcium  Carb-Cholecalciferol  600-800 MG-UNIT TABS Take 1 tablet by mouth 2 (two) times daily.      cetirizine (ZYRTEC) 10 MG tablet Take 10 mg by  mouth daily.     clotrimazole -betamethasone  (LOTRISONE ) cream APPLY 1-2 TIMES A DAY AS NEEDED FOR SPOT TREATMENT FOR YEAST SKIN RASH 30 g 1   esomeprazole  (NEXIUM ) 20 MG capsule TAKE 1 CAPSULE (20 MG TOTAL) BY MOUTH 2 (TWO) TIMES DAILY BEFORE A MEAL. 180 capsule 0   fluticasone  (FLONASE ) 50 MCG/ACT nasal spray Place 2 sprays into the nose at bedtime.      Glucosamine-Chondroitin (COSAMIN DS PO) Take 1 tablet by mouth 2 (two) times daily.     glucose blood (ONETOUCH VERIO) test strip USE TO CHECK BLOOD SUGAR TWICE A DAY. DX R73.03. 100 strip 1   Lancets Misc. MISC Use to check blood sugar twice a day.  DX R73.03. May substitute to any manufacturer covered by patient's insurance. 100 each 0   lidocaine  (XYLOCAINE ) 2 % solution 10 mLs.     lidocaine -prilocaine  (EMLA ) cream Apply to affected area once 30 g 3   Multiple Vitamins-Minerals (MULTIVITAMIN WITH MINERALS) tablet Take 1 tablet by mouth every evening. Women's One-A-Day     nystatin  (MYCOSTATIN /NYSTOP ) powder APPLY TO AFFECTED AREA 3 TIMES A DAY 30 g 1   Omega-3 Fatty Acids (FISH OIL) 1000 MG CAPS Take 1,000 mg by mouth daily.     ondansetron  (ZOFRAN ) 8 MG tablet Take 1 tablet (8 mg total) by mouth every  8 (eight) hours as needed for nausea or vomiting. 30 tablet 1   pilocarpine  (SALAGEN ) 5 MG tablet Take 1 tablet (5 mg total) by mouth 3 (three) times daily. For dry mouth from radiation (Patient taking differently: Take 5 mg by mouth 3 (three) times daily as needed. For dry mouth from radiation) 90 tablet 1   prochlorperazine  (COMPAZINE ) 10 MG tablet Take 1 tablet (10 mg total) by mouth every 6 (six) hours as needed for nausea or vomiting. 30 tablet 1   TURMERIC PO Take 538 mg by mouth daily.     No current facility-administered medications for this visit.    Allergies  Allergen Reactions   Ibuprofen Nausea And Vomiting and Other (See Comments)    GI upset, GERD   Azithromycin  Hives    Itching, blotchy rash    Macrobid [Nitrofurantoin]  Nausea And Vomiting    Family History  Problem Relation Age of Onset   Breast cancer Neg Hx     Social History   Socioeconomic History   Marital status: Married    Spouse name: Jazzmin Newbold   Number of children: 2   Years of education: Not on file   Highest education level: 12th grade  Occupational History   Occupation: Retired  Tobacco Use   Smoking status: Former    Current packs/day: 0.00    Types: Cigarettes    Quit date: 12/28/1986    Years since quitting: 37.2   Smokeless tobacco: Never  Vaping Use   Vaping status: Never Used  Substance and Sexual Activity   Alcohol use: No   Drug use: No   Sexual activity: Not Currently  Other Topics Concern   Not on file  Social History Narrative   Not on file   Social Drivers of Health   Financial Resource Strain: Low Risk  (03/10/2024)   Overall Financial Resource Strain (CARDIA)    Difficulty of Paying Living Expenses: Not hard at all  Food Insecurity: No Food Insecurity (03/10/2024)   Hunger Vital Sign    Worried About Running Out of Food in the Last Year: Never true    Ran Out of Food in the Last Year: Never true  Transportation Needs: No Transportation Needs (03/10/2024)   PRAPARE - Administrator, Civil Service (Medical): No    Lack of Transportation (Non-Medical): No  Physical Activity: Sufficiently Active (03/10/2024)   Exercise Vital Sign    Days of Exercise per Week: 5 days    Minutes of Exercise per Session: 30 min  Stress: No Stress Concern Present (03/10/2024)   [name]    Feeling of Stress : Not at all  Social Connections: Socially Integrated (03/10/2024)   Social Connection and Isolation Panel [NHANES]    Frequency of Communication with Friends and Family: More than three times a week    Frequency of Social Gatherings with Friends and Family: Once a week    Attends Religious Services: More than 4 times per year    Active Member of  [employer] or Organizations: Yes    Attends Banker Meetings: More than 4 times per year    Marital Status: Married  Catering manager Violence: Not At Risk (01/12/2024)   Received from [facility]   Humiliation, Afraid, Rape, and Kick questionnaire    Fear of Current or Ex-Partner: No    Emotionally Abused: No    Physically Abused: No    Sexually Abused: No  Constitutional: Denies fever, malaise, fatigue, headache or abrupt weight changes.  HEENT: Denies eye pain, eye redness, ear pain, ringing in the ears, wax buildup, runny nose, nasal congestion, bloody nose, or sore throat. Respiratory: Denies difficulty breathing, shortness of breath, cough or sputum production.   Cardiovascular: Denies chest pain, chest tightness, palpitations or swelling in the hands or feet.  Gastrointestinal: Patient reports intermittent reflux.  Denies abdominal pain, bloating, constipation, diarrhea or blood in the stool.  GU: Patient reports urinary urgency and frequency.  Denies pain with urination, burning sensation, blood in urine, odor or discharge. Musculoskeletal: Patient reports joint pain.  Denies decrease in range of motion, difficulty with gait, muscle pain or joint swelling.  Skin: Denies redness, rashes, lesions or ulcercations.  Neurological: Denies dizziness, difficulty with memory, difficulty with speech or problems with balance and coordination.  Psych: Denies anxiety, depression, SI/HI.  No other specific complaints in a complete review of systems (except as listed in HPI above).  Objective:   Physical Exam  BP (!) 140/62 (BP Location: Left Arm, Patient Position: Sitting, Cuff Size: Normal)   Ht 5\' 1"  (1.549 m)   Wt 163 lb 12.8 oz (74.3 kg)   LMP  (LMP Unknown)   BMI 30.95 kg/m    Wt Readings from Last 3 Encounters:  03/10/24 164 lb (74.4 kg)  02/18/24 163 lb 14.4 oz (74.3 kg)  01/28/24 163 lb 8 oz (74.2 kg)    General: Appears her stated age, obese, in NAD. Skin:  Warm, dry and intact.  Sun damaged skin of hands and forearms noted. HEENT: Head: normal shape and size; Eyes: sclera white, no icterus, conjunctiva pink, PERRLA and EOMs intact;  Neck: Neck supple, trachea midline.  Scarring noted to the right side of the neck.  No masses, lumps or thyromegaly noted. Cardiovascular: Normal rate and rhythm. S1,S2 noted.  No murmur, rubs or gallops noted. No JVD or BLE edema. No carotid bruits noted. Pulmonary/Chest: Normal effort and positive vesicular breath sounds. No respiratory distress. No wheezes, rales or ronchi noted.  Abdomen: Soft and nontender. Normal bowel sounds.  Musculoskeletal: No difficulty with gait.  Neurological: Alert and oriented. Coordination normal.  Psychiatric: Mood and affect normal. Behavior is normal. Judgment and thought content normal.     BMET    Component Value Date/Time   NA 137 03/10/2024 0856   K 4.0 03/10/2024 0856   CL 104 03/10/2024 0856   CO2 24 03/10/2024 0856   GLUCOSE 107 (H) 03/10/2024 0856   BUN 25 (H) 03/10/2024 0856   CREATININE 0.63 03/10/2024 0856   CREATININE 0.75 03/13/2023 0832   CALCIUM  8.7 (L) 03/10/2024 0856   GFRNONAA >60 03/10/2024 0856   GFRNONAA >89 06/24/2016 0853   GFRAA >60 06/16/2019 0337   GFRAA >89 06/24/2016 0853    Lipid Panel     Component Value Date/Time   CHOL 136 09/12/2023 0906   TRIG 100 09/12/2023 0906   HDL 53 09/12/2023 0906   CHOLHDL 2.6 09/12/2023 0906   VLDL 30.4 08/22/2020 1211   LDLCALC 64 09/12/2023 0906    CBC    Component Value Date/Time   WBC 8.4 03/10/2024 0856   WBC 7.0 03/13/2023 0832   RBC 4.33 03/10/2024 0856   HGB 13.2 03/10/2024 0856   HCT 40.1 03/10/2024 0856   PLT 144 (L) 03/10/2024 0856   MCV 92.6 03/10/2024 0856   MCV 86.6 06/24/2016 0909   MCH 30.5 03/10/2024 0856   MCHC 32.9 03/10/2024 0856   RDW  12.8 03/10/2024 0856   LYMPHSABS 2.3 03/10/2024 0856   MONOABS 0.7 03/10/2024 0856   EOSABS 0.2 03/10/2024 0856   BASOSABS 0.0  03/10/2024 0856    Hgb A1C Lab Results  Component Value Date   HGBA1C 5.8 (H) 09/12/2023           Assessment & Plan:      RTC in 6 months for your annual exam Helayne Lo, NP

## 2024-03-12 NOTE — Assessment & Plan Note (Signed)
 Avoid foods that trigger reflux Encouraged weight loss as this can help reduce reflux symptoms Continue esomeprazole  20 mg twice daily Okay to continue tums as needed

## 2024-03-12 NOTE — Assessment & Plan Note (Signed)
 Currently getting immunotherapy She will continue to follow with oncology

## 2024-03-12 NOTE — Assessment & Plan Note (Signed)
 Encourage diet and exercise for weight loss

## 2024-03-12 NOTE — Assessment & Plan Note (Signed)
 She will continue botox injections as needed

## 2024-03-12 NOTE — Assessment & Plan Note (Signed)
 Will check lipid profile at annual exam Encouraged to consume a low-fat diet Continue atorvastatin  10 mg and fish oil OTC

## 2024-03-12 NOTE — Assessment & Plan Note (Signed)
 Encourage weight loss as this can help reduce joint pain Continue glucosamine and turmeric OTC She will continue to follow with orthopedics

## 2024-03-12 NOTE — Patient Instructions (Signed)

## 2024-03-14 ENCOUNTER — Other Ambulatory Visit: Payer: Self-pay

## 2024-03-15 ENCOUNTER — Ambulatory Visit: Payer: Self-pay

## 2024-03-15 ENCOUNTER — Encounter: Payer: Self-pay | Admitting: Emergency Medicine

## 2024-03-15 ENCOUNTER — Emergency Department

## 2024-03-15 ENCOUNTER — Emergency Department
Admission: EM | Admit: 2024-03-15 | Discharge: 2024-03-15 | Disposition: A | Discharge status: Home / Self Care | Attending: Emergency Medicine | Admitting: Emergency Medicine

## 2024-03-15 ENCOUNTER — Other Ambulatory Visit: Payer: Self-pay

## 2024-03-15 DIAGNOSIS — M19042 Primary osteoarthritis, left hand: Secondary | ICD-10-CM | POA: Diagnosis not present

## 2024-03-15 DIAGNOSIS — S61211A Laceration without foreign body of left index finger without damage to nail, initial encounter: Secondary | ICD-10-CM | POA: Diagnosis not present

## 2024-03-15 DIAGNOSIS — W293XXA Contact with powered garden and outdoor hand tools and machinery, initial encounter: Secondary | ICD-10-CM | POA: Insufficient documentation

## 2024-03-15 DIAGNOSIS — Z85818 Personal history of malignant neoplasm of other sites of lip, oral cavity, and pharynx: Secondary | ICD-10-CM | POA: Diagnosis not present

## 2024-03-15 DIAGNOSIS — Z85820 Personal history of malignant melanoma of skin: Secondary | ICD-10-CM | POA: Insufficient documentation

## 2024-03-15 MED ORDER — CEPHALEXIN 500 MG PO CAPS
500.0000 mg | ORAL_CAPSULE | Freq: Three times a day (TID) | ORAL | 0 refills | Status: AC
Start: 2024-03-15 — End: 2024-03-22

## 2024-03-15 MED ORDER — LIDOCAINE HCL (PF) 1 % IJ SOLN
5.0000 mL | Freq: Once | INTRAMUSCULAR | Status: AC
  Administered 2024-03-15: 5 mL via INTRADERMAL
  Filled 2024-03-15: qty 5

## 2024-03-15 NOTE — Telephone Encounter (Signed)
 FYI Only or Action Required?: FYI only for provider  Patient was last seen in primary care on 03/12/2024 by Carollynn Cirri, NP. Called Nurse Triage reporting Laceration. Symptoms began today. Interventions attempted: Other: covered cut area & pressure to stop bleeding. Symptoms are: unchanged.  Triage Disposition: Go to ED Now (Notify PCP): pt verbalized understanding & stated going to ED now.  Patient/caregiver understands and will follow disposition?: Yes Copied from CRM [id number]. Topic: Clinical - Red Word Triage >> Mar 15, 2024 10:07 AM Rosaria Common wrote: Red Word that prompted transfer to Nurse Triage: Pt has cut her finger and in need of stiches and medical advice due to PCP unable to input stitches. PCP is able to remove stitches. Reason for Disposition  Skin is split open or gaping (or length > 1/2 inch or 12 mm)    Elderly pt: not able clean cut area well & stating area may need stiches  Answer Assessment - Initial Assessment Questions 1. MECHANISM: "How did the injury happen?"      Chain saw cut index finger on left hand 2. ONSET: "When did the injury happen?" (Minutes or hours ago)      today 3. LOCATION: "What part of the finger is injured?" "Is the nail damaged?"       index finger on left hand  4. APPEARANCE of the INJURY: "What does the injury look like?"      Looks like skin off almost to bone 5. SEVERITY: "Can you use the hand normally?"  "Can you bend your fingers into a ball and then fully open them?"     N/a 6. SIZE: For cuts, bruises, or swelling, ask: "How large is it?" (e.g., inches or centimeters;  entire finger)      unknown 7. PAIN: "Is there pain?" If Yes, ask: "How bad is the pain?"    (e.g., Scale 1-10; or mild, moderate, severe)  - NONE (0): no pain.  - MILD (1-3): doesn't interfere with normal activities.   - MODERATE (4-7): interferes with normal activities or awakens from sleep.  - SEVERE (8-10): excruciating pain, unable to hold a glass of water or bend  finger even a little.     Moderate+ 8. TETANUS: For any breaks in the skin, ask: "When was the last tetanus booster?"     Approx 1 year 9. OTHER SYMPTOMS: "Do you have any other symptoms?"     Bleeding has stopped. 10. PREGNANCY: "Is there any chance you are pregnant?" "When was your last menstrual period?"       N/a  Protocols used: Finger Injury-A-AH

## 2024-03-15 NOTE — ED Triage Notes (Signed)
 Patient to ED via POV for laceration to left pointer finger. PT reports finger slipped while using the chainsaw. Bleeding controlled, takes a daily ASA.

## 2024-03-15 NOTE — Discharge Instructions (Signed)
 Keep the area as clean and dry as possible.  If you see any redness pus or swelling this indicates infection you should see your regular doctor or return emergency department.  Have the sutures removed in 7 to 10 days. Take the antibiotic as prescribed.  Tylenol  for pain as needed.

## 2024-03-15 NOTE — Telephone Encounter (Signed)
 Will review ER note once available

## 2024-03-15 NOTE — ED Provider Notes (Signed)
 [facility] Provider Note    Event Date/Time   First MD Initiated Contact with Patient 03/15/24 1101     (approximate)   History   Laceration   HPI  [name] is a 83 y.o. female history of melanoma, prediabetes, mouth cancer presents emergency department laceration to the left index finger.  Patient was using a chainsaw to cut wood when her finger slipped and she cut her finger.  Tdap is up-to-date.  No other injuries reported.  Incident happened just prior to arrival      Physical Exam   Triage Vital Signs: ED Triage Vitals [03/15/24 1041]  Encounter Vitals Group     BP (!) 146/64     Systolic BP Percentile      Diastolic BP Percentile      Pulse Rate 76     Resp 17     Temp 98.5 F (36.9 C)     Temp Source Oral     SpO2 94 %     Weight 163 lb (73.9 kg)     Height 5\' 1"  (1.549 m)     Head Circumference      Peak Flow      Pain Score 9     Pain Loc      Pain Education      Exclude from Growth Chart     Most recent vital signs: Vitals:   03/15/24 1041  BP: (!) 146/64  Pulse: 76  Resp: 17  Temp: 98.5 F (36.9 C)  SpO2: 94%     General: Awake, no distress.   CV:  Good peripheral perfusion.  Resp:  Normal effort.  Abd:  No distention.   Other:  Left index finger with laceration noted on the dorsum, area tender to palpation, full range of motion, neurovascular intact   ED Results / Procedures / Treatments   Labs (all labs ordered are listed, but only abnormal results are displayed) Labs Reviewed - No data to display   EKG     RADIOLOGY X-ray left index finger    PROCEDURES:   .Laceration Repair  Date/Time: 03/15/2024 12:30 PM  Performed by: Delsie Figures, PA-C Authorized by: Delsie Figures, PA-C   Consent:    Consent obtained:  Verbal   Consent given by:  Patient   Risks, benefits, and alternatives were discussed: yes     Risks discussed:  Infection, pain, retained foreign body, tendon damage,  poor cosmetic result, need for additional repair, nerve damage, poor wound healing and vascular damage   Alternatives discussed:  Delayed treatment Universal protocol:    Procedure explained and questions answered to patient or proxy's satisfaction: yes     Immediately prior to procedure, a time out was called: yes     Patient identity confirmed:  Verbally with patient Anesthesia:    Anesthesia method:  Nerve block   Block location:  Left index finger   Block needle gauge:  27 G   Block anesthetic:  Lidocaine  1% w/o epi   Block injection procedure:  Anatomic landmarks identified, introduced needle, incremental injection, anatomic landmarks palpated and negative aspiration for blood   Block outcome:  Anesthesia achieved Laceration details:    Location:  Finger   Finger location:  L index finger   Length (cm):  4 Pre-procedure details:    Preparation:  Patient was prepped and draped in usual sterile fashion and imaging obtained to evaluate for foreign bodies Exploration:    Hemostasis achieved  with:  Direct pressure   Imaging obtained: x-ray     Imaging outcome: foreign body not noted     Wound exploration: wound explored through full range of motion and entire depth of wound visualized     Wound extent: areolar tissue not violated, fascia not violated, no foreign body, no signs of injury, no nerve damage, no tendon damage, no underlying fracture and no vascular damage     Contaminated: no   Treatment:    Area cleansed with:  Povidone-iodine    Amount of cleaning:  Standard   Irrigation solution:  Sterile saline   Irrigation method:  Tap   Debridement:  None   Undermining:  None Skin repair:    Repair method:  Sutures   Suture size:  5-0   Suture material:  Nylon   Suture technique:  Simple interrupted   Number of sutures:  10 Approximation:    Approximation:  Close Repair type:    Repair type:  Simple Post-procedure details:    Dressing:  Non-adherent dressing   Procedure  completion:  Tolerated well, no immediate complications   Critical Care:  no Chief Complaint  Patient presents with   Laceration      MEDICATIONS ORDERED IN ED: Medications  lidocaine  (PF) (XYLOCAINE ) 1 % injection 5 mL (5 mLs Intradermal Given 03/15/24 1115)     IMPRESSION / MDM / ASSESSMENT AND PLAN / ED COURSE  I reviewed the triage vital signs and the nursing notes.                              Differential diagnosis includes, but is not limited to, laceration, abrasion, fracture, open fracture  Patient's presentation is most consistent with acute illness / injury with system symptoms.    Medications given: xylocaine   X-ray left index finger ordered See procedure note for laceration repair  X-ray left index independently reviewed interpreted by me as being negative for any acute abnormality, confirmed by radiology  Patient tolerated the procedure well.  I did place her on a flex 3 times daily days due to the laceration being from a chainsaw while she was cutting wood.  This should help prevent infection.  She is to follow-up with her regular doctor in 7 to 10 days for suture removal.  Return emergency department worsening.  Take Tylenol  for pain as needed.  Keep the areas clean and dry as possible.  Patient states she understands.  She was discharged stable condition.      FINAL CLINICAL IMPRESSION(S) / ED DIAGNOSES   Final diagnoses:  Laceration of left index finger without foreign body without damage to nail, initial encounter     Rx / DC Orders   ED Discharge Orders          Ordered    cephALEXin (KEFLEX) 500 MG capsule  3 times daily        03/15/24 1226             Note:  This document was prepared using Dragon voice recognition software and may include unintentional dictation errors.    Delsie Figures, PA-C 03/15/24 1238    Viviano Ground, MD 03/15/24 [phone]

## 2024-03-15 NOTE — ED Notes (Signed)
 See triage note  Presents with laceration to left index finger  States she was using a small chain saw  Slipped and cut finger  Bleeding controlled at present

## 2024-03-23 ENCOUNTER — Ambulatory Visit (INDEPENDENT_AMBULATORY_CARE_PROVIDER_SITE_OTHER): Admitting: Internal Medicine

## 2024-03-23 ENCOUNTER — Encounter: Payer: Self-pay | Admitting: Internal Medicine

## 2024-03-23 VITALS — BP 118/72 | Ht 61.0 in | Wt 164.4 lb

## 2024-03-23 DIAGNOSIS — S61211D Laceration without foreign body of left index finger without damage to nail, subsequent encounter: Secondary | ICD-10-CM | POA: Diagnosis not present

## 2024-03-23 NOTE — Progress Notes (Signed)
 Subjective:    Patient ID: [name], female    DOB: [birth year], 83 y.o.   MRN: [id number]  HPI  Discussed the use of AI scribe software for clinical note transcription with the patient, who gave verbal consent to proceed.  [name] is an 83 year old female who presents for suture removal following a finger laceration.  She sustained a laceration to her finger on March 15, 2024, while using a chainsaw. The injury required medical attention, and she was evaluated at the hospital where an x-ray was performed to rule out any bone injury. The laceration was closed with what was reported as ten stitches, although she believes it was eleven.  She completed a course of antibiotics, specifically Keflex , taken three times a day, to prevent infection. No significant pain with finger movement, although there is mild tenderness when pressure is applied to a specific area of the finger. Her tetanus vaccination was up to date as of 2023, so no additional tetanus booster was required at the time of injury.       Review of Systems   Past Medical History:  Diagnosis Date   Allergy    Arthritis    Cancer (HCC)    History of chicken pox    History of colon polyps    History of mouth cancer    Osteoarthritis of knee    Medial compartment, Right    Current Outpatient Medications  Medication Sig Dispense Refill   acetaminophen  (TYLENOL ) 325 MG tablet Take by mouth.     aspirin  EC 81 MG tablet Take 81 mg by mouth daily.     atorvastatin  (LIPITOR) 10 MG tablet TAKE 1 TABLET BY MOUTH EVERY DAY 90 tablet 1   Blood Glucose Monitoring Suppl DEVI Use to check blood sugar twice a day.  DX R73.03. May substitute to any manufacturer covered by patient's insurance. 1 each 0   Calcium  Carb-Cholecalciferol  600-800 MG-UNIT TABS Take 1 tablet by mouth 2 (two) times daily.      cetirizine (ZYRTEC) 10 MG tablet Take 10 mg by mouth daily.     clotrimazole -betamethasone  (LOTRISONE ) cream APPLY 1-2 TIMES A  DAY AS NEEDED FOR SPOT TREATMENT FOR YEAST SKIN RASH 30 g 1   esomeprazole  (NEXIUM ) 20 MG capsule TAKE 1 CAPSULE (20 MG TOTAL) BY MOUTH 2 (TWO) TIMES DAILY BEFORE A MEAL. 180 capsule 0   fluticasone  (FLONASE ) 50 MCG/ACT nasal spray Place 2 sprays into the nose at bedtime.      Glucosamine-Chondroitin (COSAMIN DS PO) Take 1 tablet by mouth 2 (two) times daily.     glucose blood (ONETOUCH VERIO) test strip USE TO CHECK BLOOD SUGAR TWICE A DAY. DX R73.03. 100 strip 1   Lancets Misc. MISC Use to check blood sugar twice a day.  DX R73.03. May substitute to any manufacturer covered by patient's insurance. 100 each 0   lidocaine  (XYLOCAINE ) 2 % solution 10 mLs.     lidocaine -prilocaine  (EMLA ) cream Apply to affected area once 30 g 3   Multiple Vitamins-Minerals (MULTIVITAMIN WITH MINERALS) tablet Take 1 tablet by mouth every evening. Women's One-A-Day     nystatin  (MYCOSTATIN /NYSTOP ) powder APPLY TO AFFECTED AREA 3 TIMES A DAY 30 g 1   Omega-3 Fatty Acids (FISH OIL) 1000 MG CAPS Take 1,000 mg by mouth daily.     ondansetron  (ZOFRAN ) 8 MG tablet Take 1 tablet (8 mg total) by mouth every 8 (eight) hours as needed for nausea or vomiting. 30 tablet 1  pilocarpine  (SALAGEN ) 5 MG tablet Take 1 tablet (5 mg total) by mouth 3 (three) times daily. 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 30 tablet 1   TURMERIC PO Take 538 mg by mouth daily.     No current facility-administered medications for this visit.    Allergies  Allergen Reactions   Ibuprofen Nausea And Vomiting and Other (See Comments)    GI upset, GERD   Azithromycin  Hives    Itching, blotchy rash    Macrobid [Nitrofurantoin] Nausea And Vomiting    Family History  Problem Relation Age of Onset   Breast cancer Neg Hx     Social History   Socioeconomic History    Marital status: Married    Spouse name: Reisha Wos   Number of children: 2   Years of education: Not on file   Highest education level: 12th grade  Occupational History   Occupation: Retired  Tobacco Use   Smoking status: Former    Current packs/day: 0.00    Types: Cigarettes    Quit date: 12/28/1986    Years since quitting: 37.2   Smokeless tobacco: Never  Vaping Use   Vaping status: Never Used  Substance and Sexual Activity   Alcohol use: No   Drug use: No   Sexual activity: Not Currently  Other Topics Concern   Not on file  Social History Narrative   Not on file   Social Drivers of Health   Financial Resource Strain: Low Risk  (03/10/2024)   Overall Financial Resource Strain (CARDIA)    Difficulty of Paying Living Expenses: Not hard at all  Food Insecurity: No Food Insecurity (03/10/2024)   Hunger Vital Sign    Worried About Running Out of Food in the Last Year: Never true    Ran Out of Food in the Last Year: Never true  Transportation Needs: No Transportation Needs (03/10/2024)   PRAPARE - Administrator, Civil Service (Medical): No    Lack of Transportation (Non-Medical): No  Physical Activity: Sufficiently Active (03/10/2024)   Exercise Vital Sign    Days of Exercise per Week: 5 days    Minutes of Exercise per Session: 30 min  Stress: No Stress Concern Present (03/10/2024)   [name]    Feeling of Stress : Not at all  Social Connections: Socially Integrated (03/10/2024)   Social Connection and Isolation Panel    Frequency of Communication with Friends and Family: More than three times a week    Frequency of Social Gatherings with Friends and Family: Once a week    Attends Religious Services: More than 4 times per year    Active Member of [employer] or Organizations: Yes    Attends Engineer, structural: More than 4 times per year    Marital Status: Married  Catering manager Violence: Not  At Risk (01/12/2024)   Received from [facility]   Humiliation, Afraid, Rape, and Kick questionnaire    Within the last year, have you been afraid of your partner or ex-partner?: No    Within the last year, have you been humiliated or emotionally abused in other ways by your partner or ex-partner?: No    Within the last year, have you been kicked, hit, slapped, or otherwise physically  hurt by your partner or ex-partner?: No    Within the last year, have you been raped or forced to have any kind of sexual activity by your partner or ex-partner?: No     Constitutional: Denies fever, malaise, fatigue, headache or abrupt weight changes.  Respiratory: Denies difficulty breathing, shortness of breath, cough or sputum production.   Cardiovascular: Denies chest pain, chest tightness, palpitations or swelling in the hands or feet.  Musculoskeletal: Patient reports decreased range of motion of the left index finger.  Denies difficulty with gait, muscle pain or joint pain and swelling.  Skin: Patient reports laceration to left index finger.  Denies redness, rashes, lesions or ulcercations.    No other specific complaints in a complete review of systems (except as listed in HPI above).      Objective:   Physical Exam BP 118/72 (BP Location: Right Arm, Patient Position: Sitting, Cuff Size: Normal)   Ht 5' 1 (1.549 m)   Wt 164 lb 6.4 oz (74.6 kg)   LMP  (LMP Unknown)   BMI 31.06 kg/m   Wt Readings from Last 3 Encounters:  03/15/24 163 lb (73.9 kg)  03/12/24 163 lb 12.8 oz (74.3 kg)  03/10/24 164 lb (74.4 kg)    General: Appears her stated age, obese, in NAD. Skin: Horseshoe-shaped laceration noted of the lateral left index finger intact without redness, warmth, drainage.  Some residual bruising noted. Cardiovascular: Normal rate and rhythm.  Cap refill <3 seconds of the left index finger. Pulmonary/Chest: Normal effort and positive vesicular breath sounds. No respiratory distress. No  wheezes, rales or ronchi noted.  Musculoskeletal: Decreased flexion of the left index finger.  Normal extension of the left index finger. Neurological: Alert and oriented.  Coordination normal.    BMET    Component Value Date/Time   NA 137 03/10/2024 0856   K 4.0 03/10/2024 0856   CL 104 03/10/2024 0856   CO2 24 03/10/2024 0856   GLUCOSE 107 (H) 03/10/2024 0856   BUN 25 (H) 03/10/2024 0856   CREATININE 0.63 03/10/2024 0856   CREATININE 0.75 03/13/2023 0832   CALCIUM  8.7 (L) 03/10/2024 0856   GFRNONAA >60 03/10/2024 0856   GFRNONAA >89 06/24/2016 0853   GFRAA >60 06/16/2019 0337   GFRAA >89 06/24/2016 0853    Lipid Panel     Component Value Date/Time   CHOL 136 09/12/2023 0906   TRIG 100 09/12/2023 0906   HDL 53 09/12/2023 0906   CHOLHDL 2.6 09/12/2023 0906   VLDL 30.4 08/22/2020 1211   LDLCALC 64 09/12/2023 0906    CBC    Component Value Date/Time   WBC 8.4 03/10/2024 0856   WBC 7.0 03/13/2023 0832   RBC 4.33 03/10/2024 0856   HGB 13.2 03/10/2024 0856   HCT 40.1 03/10/2024 0856   PLT 144 (L) 03/10/2024 0856   MCV 92.6 03/10/2024 0856   MCV 86.6 06/24/2016 0909   MCH 30.5 03/10/2024 0856   MCHC 32.9 03/10/2024 0856   RDW 12.8 03/10/2024 0856   LYMPHSABS 2.3 03/10/2024 0856   MONOABS 0.7 03/10/2024 0856   EOSABS 0.2 03/10/2024 0856   BASOSABS 0.0 03/10/2024 0856    Hgb A1C Lab Results  Component Value Date   HGBA1C 5.8 (H) 09/12/2023            Assessment & Plan:  Assessment and Plan    Laceration of the left index finger Healing without infection post-sutures and Keflex . Potential skin necrosis noted, causing mild pain but no functional  impairment. Skin peeling explained as normal healing. ER notes and imaging reviewed - Remove sutures from the finger. - Advise clipping necrotic skin with nail clipper, maintain cleanliness and dryness. - Monitor for improved finger bending as healing progresses.        RTC in 6 months for your annual  exam Helayne Lo, NP

## 2024-03-23 NOTE — Patient Instructions (Signed)
Wound Closure Removal, Care After The following information offers guidance on how to care for yourself after your stitches (sutures), staples, or adhesive strips have been removed. Your health care provider may also give you more specific instructions. If you have problems or questions, contact your health care provider. What can I expect after the procedure? After your sutures or staples have been removed or your adhesive strips have fallen off, it is common to have: Some discomfort and swelling in the area. Slight redness in the area. Follow these instructions at home: If you have a dressing: Wash your hands with soap and water for at least 20 seconds before and after you change your bandage (dressing). If soap and water are not available, use hand sanitizer. Change your dressing as told by your health care provider. If your dressing becomes wet or dirty, or develops a bad smell, change it as soon as possible. If your dressing sticks to your skin, pour warm, clean water over it until it loosens and can be removed without pulling apart the wound edges. Pat the area dry with a soft, clean towel. Do not rub the wound because that may cause bleeding. Wound care  Check your wound every day for signs of infection. Check for: More redness, swelling, or pain. Fluid or blood. New warmth, a rash, or hardness at the wound site. Pus or a bad smell. Wash your hands with soap and water for at least 20 seconds before and after touching your wound. If soap and water are not available, use hand sanitizer. Keep the wound area dry and clean. Clean and pat the wound dry as told by your health care provider. Apply cream or ointment only as told by your health care provider. If skin glue or adhesive strips were applied after sutures or staples were removed, leave these closures in place until they peel off on their own. If adhesive strip edges start to loosen and curl up, you may trim the loose edges. Do not  remove adhesive strips completely unless your health care provider tells you to do that. Continue to protect the wound from injury. Do not pick at your wound. Picking can cause an infection. Bathing Do not take baths, swim, or use a hot tub until your health care provider approves. Ask your health care provider if you may take showers. Follow these steps for showering: If you have a dressing, remove it before getting into the shower. In the shower, allow soapy water to get on the wound. Avoid scrubbing the wound. When you get out of the shower, dry the wound by patting it with a clean towel. Reapply a dressing over the wound, if needed. Scar care When your wound has completely healed, help decrease the size of your scar by: Wearing sunscreen over the scar or covering it with clothing when you are outside. New scars get sunburned easily, which can make scarring worse. Gently massaging the scarred area. This can decrease scar thickness. General instructions Take over-the-counter and prescription medicines only as told by your health care provider. Keep all follow-up visits. This is important. Contact a health care provider if: You have more redness, swelling, or pain around your wound. You have fluid or blood coming from your wound. You have new warmth, a rash, or hardness at the wound site. You have pus or a bad smell coming from your wound. Your wound opens up. Get help right away if: You have a fever or chills. You have red streaks coming from  your wound. Summary Change your dressing as told by your health care provider. If your dressing becomes wet or dirty, or develops a bad smell, change it as soon as possible. Check your wound every day for signs of infection. Wash your hands with soap and water for 20 seconds before and after touching your wound. This information is not intended to replace advice given to you by your health care provider. Make sure you discuss any questions you  have with your health care provider. Document Revised: 01/16/2021 Document Reviewed: 01/16/2021 Elsevier Patient Education  2024 [employer].

## 2024-03-29 ENCOUNTER — Ambulatory Visit (HOSPITAL_BASED_OUTPATIENT_CLINIC_OR_DEPARTMENT_OTHER): Admitting: Student

## 2024-03-29 ENCOUNTER — Ambulatory Visit (HOSPITAL_BASED_OUTPATIENT_CLINIC_OR_DEPARTMENT_OTHER)

## 2024-03-29 ENCOUNTER — Encounter (HOSPITAL_BASED_OUTPATIENT_CLINIC_OR_DEPARTMENT_OTHER): Payer: Self-pay | Admitting: Student

## 2024-03-29 DIAGNOSIS — M47812 Spondylosis without myelopathy or radiculopathy, cervical region: Secondary | ICD-10-CM | POA: Diagnosis not present

## 2024-03-29 DIAGNOSIS — M19011 Primary osteoarthritis, right shoulder: Secondary | ICD-10-CM | POA: Diagnosis not present

## 2024-03-29 DIAGNOSIS — M7918 Myalgia, other site: Secondary | ICD-10-CM

## 2024-03-29 DIAGNOSIS — M25511 Pain in right shoulder: Secondary | ICD-10-CM

## 2024-03-29 DIAGNOSIS — M542 Cervicalgia: Secondary | ICD-10-CM | POA: Diagnosis not present

## 2024-03-29 NOTE — Progress Notes (Signed)
 Chief Complaint: Pain in right shoulder blade    Discussed the use of AI scribe software for clinical note transcription with the patient, who gave verbal consent to proceed.  History of Present Illness [name] is an 83 year old female with a history of melanoma who presents with right shoulder blade pain.  She experiences burning, stinging pain around the right shoulder blade, exacerbated by using her right arm during activities like peeling peaches or cutting cucumbers. The pain is localized to the shoulder blade area and slightly upwards, with minimal extension to the edge of the neck and no radiation down the arm. Certain spots are sore to touch, and activities requiring her to hold her arms up, such as sewing, worsen the pain.  She takes extra strength Tylenol , two tablets up to three times a day, with some relief. She cannot take ibuprofen. No numbness or tingling in the arm is present. Her medical history includes previous left shoulder arthroscopy in 2020. She is undergoing immunotherapy for stage three melanoma diagnosed last October, with a port in place for treatment.    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   IR IMAGING GUIDED PORT INSERTION  08/21/2023   KNEE ARTHROSCOPY W/ MENISCAL REPAIR Left    PARTIAL KNEE ARTHROPLASTY Right 09/16/2017   PARTIAL KNEE ARTHROPLASTY Right 09/16/2017   Procedure: RIGHT UNICOMPARTMENTAL KNEE;  Surgeon: Vernetta Lonni GRADE, MD;  Location: [facility];  Service: Orthopedics;  Laterality: Right;    SHOULDER ARTHROSCOPY Left 11/2018   TOTAL KNEE REVISION Right 06/15/2019   Procedure: RIGHT UNICOMPARTMENTAL ARTHROPLASTY TO TOTAL KNEE ARTHROPLASTY;  Surgeon: Vernetta Lonni GRADE, MD;  Location: [facility];  Service: Orthopedics;  Laterality: Right;   TUBAL LIGATION     Social History   Socioeconomic History   Marital status: Married    Spouse name: Rashan Patient   Number of children: 2   Years of education: Not on file   Highest education level: 12th grade  Occupational History   Occupation: Retired  Tobacco Use   Smoking status: Former    Current packs/day: 0.00    Types: Cigarettes    Quit date: 12/28/1986    Years since quitting: 37.2   Smokeless tobacco: Never  Vaping Use   Vaping status: Never Used  Substance and Sexual Activity   Alcohol use: No   Drug use: No   Sexual activity: Not Currently  Other Topics Concern   Not on file  Social History Narrative   Not on file   Social Drivers of Health   Financial Resource Strain: Low Risk  (03/10/2024)   Overall Financial Resource Strain (CARDIA)    Difficulty of Paying Living Expenses: Not hard at all  Food Insecurity: No Food Insecurity (03/10/2024)   Hunger Vital Sign    Worried About Running  Out of Food in the Last Year: Never true    Ran Out of Food in the Last Year: Never true  Transportation Needs: No Transportation Needs (03/10/2024)   PRAPARE - Administrator, Civil Service (Medical): No    Lack of Transportation (Non-Medical): No  Physical Activity: Sufficiently Active (03/10/2024)   Exercise Vital Sign    Days of Exercise per Week: 5 days    Minutes of Exercise per Session: 30 min  Stress: No Stress Concern Present (03/10/2024)   [name]    Feeling of Stress : Not at all  Social Connections: Socially Integrated (03/10/2024)   Social Connection and Isolation Panel    Frequency of Communication with Friends and Family: More than three times  a week    Frequency of Social Gatherings with Friends and Family: Once a week    Attends Religious Services: More than 4 times per year    Active Member of [employer] or Organizations: Yes    Attends Engineer, structural: More than 4 times per year    Marital Status: Married   Family History  Problem Relation Age of Onset   Breast cancer Neg Hx    Allergies  Allergen Reactions   Ibuprofen Nausea And Vomiting and Other (See Comments)    GI upset, GERD   Azithromycin  Hives    Itching, blotchy rash    Macrobid [Nitrofurantoin] Nausea And Vomiting   Current Outpatient Medications  Medication Sig Dispense Refill   acetaminophen  (TYLENOL ) 325 MG tablet Take by mouth.     aspirin  EC 81 MG tablet Take 81 mg by mouth daily.     atorvastatin  (LIPITOR) 10 MG tablet TAKE 1 TABLET BY MOUTH EVERY DAY 90 tablet 1   Blood Glucose Monitoring Suppl DEVI Use to check blood sugar twice a day.  DX R73.03. May substitute to any manufacturer covered by patient's insurance. 1 each 0   Calcium  Carb-Cholecalciferol  600-800 MG-UNIT TABS Take 1 tablet by mouth 2 (two) times daily.      cetirizine (ZYRTEC) 10 MG tablet Take 10 mg by mouth daily.     clotrimazole -betamethasone  (LOTRISONE ) cream APPLY 1-2 TIMES A DAY AS NEEDED FOR SPOT TREATMENT FOR YEAST SKIN RASH 30 g 1   esomeprazole  (NEXIUM ) 20 MG capsule TAKE 1 CAPSULE (20 MG TOTAL) BY MOUTH 2 (TWO) TIMES DAILY BEFORE A MEAL. 180 capsule 0   fluticasone  (FLONASE ) 50 MCG/ACT nasal spray Place 2 sprays into the nose at bedtime.      Glucosamine-Chondroitin (COSAMIN DS PO) Take 1 tablet by mouth 2 (two) times daily.     glucose blood (ONETOUCH VERIO) test strip USE TO CHECK BLOOD SUGAR TWICE A DAY. DX R73.03. 100 strip 1   Lancets Misc. MISC Use to check blood sugar twice a day.  DX R73.03. May substitute to any manufacturer covered by patient's insurance. 100 each 0   lidocaine  (XYLOCAINE ) 2 % solution 10 mLs.     lidocaine -prilocaine  (EMLA ) cream Apply to  affected area once 30 g 3   Multiple Vitamins-Minerals (MULTIVITAMIN WITH MINERALS) tablet Take 1 tablet by mouth every evening. Women's One-A-Day     nystatin  (MYCOSTATIN /NYSTOP ) powder APPLY TO AFFECTED AREA 3 TIMES A DAY 30 g 1   Omega-3 Fatty Acids (FISH OIL) 1000 MG CAPS Take 1,000 mg by mouth daily.     ondansetron  (ZOFRAN ) 8 MG tablet Take 1 tablet (8 mg total) by mouth every 8 (eight) hours as  needed for nausea or vomiting. 30 tablet 1   pilocarpine  (SALAGEN ) 5 MG tablet Take 1 tablet (5 mg total) by mouth 3 (three) times daily. For dry mouth from radiation (Patient not taking: Reported on 03/23/2024) 90 tablet 1   prochlorperazine  (COMPAZINE ) 10 MG tablet Take 1 tablet (10 mg total) by mouth every 6 (six) hours as needed for nausea or vomiting. 30 tablet 1   TURMERIC PO Take 538 mg by mouth daily.     No current facility-administered medications for this visit.   No results found.  Review of Systems:   A ROS was performed including pertinent positives and negatives as documented in the HPI.  Physical Exam :   Constitutional: NAD and appears stated age Neurological: Alert and oriented Psych: Appropriate affect and cooperative There were no vitals taken for this visit.   Comprehensive Musculoskeletal Exam:    No cervical midline or paraspinal muscle tenderness.  Full cervical range of motion in all planes.  Tenderness throughout the right scapular region with multiple painful trigger points in the upper trapezius and rhomboids.  Full active right shoulder range of motion 160 degrees flexion, 30 degrees external rotation, and internal rotation to L4.  Grip strength 5/5.  Imaging:   Xray (cervical spine 4 views): Disc space narrowing at the C4-C5 and C5-C6 levels with small anterior osteophytes.  Xray (right shoulder 3 views): Mild to moderate acromioclavicular osteoarthritis but otherwise negative for bony abnormality.   I personally reviewed and interpreted the  radiographs.      Assessment & Plan Right shoulder pain   Chronic right shoulder pain worsens with movement. X-rays reveal mild acromioclavicular joint arthritis. Differential diagnosis includes myofascial pain and possible cervical radiculopathy.  Advised conservative therapies including use of a heating pad and massage for trigger points.  Provided online resources for upper back stretching exercises.  Continue Tylenol  as needed.  Consider physical therapy if there is no improvement. Reassess if symptoms worsen or new symptoms develop.     I personally saw and evaluated the patient, and participated in the management and treatment plan.  Leonce Reveal, PA-C Orthopedics

## 2024-03-31 ENCOUNTER — Inpatient Hospital Stay

## 2024-03-31 ENCOUNTER — Encounter: Payer: Self-pay | Admitting: Oncology

## 2024-03-31 ENCOUNTER — Inpatient Hospital Stay (HOSPITAL_BASED_OUTPATIENT_CLINIC_OR_DEPARTMENT_OTHER): Admitting: Oncology

## 2024-03-31 VITALS — BP 136/64 | HR 67 | Temp 96.8°F | Resp 18 | Ht 61.0 in | Wt 164.4 lb

## 2024-03-31 DIAGNOSIS — C439 Malignant melanoma of skin, unspecified: Secondary | ICD-10-CM | POA: Diagnosis not present

## 2024-03-31 DIAGNOSIS — Z5112 Encounter for antineoplastic immunotherapy: Secondary | ICD-10-CM | POA: Diagnosis not present

## 2024-03-31 LAB — CBC WITH DIFFERENTIAL (CANCER CENTER ONLY)
Abs Immature Granulocytes: 0.01 10*3/uL (ref 0.00–0.07)
Basophils Absolute: 0 10*3/uL (ref 0.0–0.1)
Basophils Relative: 1 %
Eosinophils Absolute: 0.1 10*3/uL (ref 0.0–0.5)
Eosinophils Relative: 2 %
HCT: 38.6 % (ref 36.0–46.0)
Hemoglobin: 12.7 g/dL (ref 12.0–15.0)
Immature Granulocytes: 0 %
Lymphocytes Relative: 29 %
Lymphs Abs: 1.8 10*3/uL (ref 0.7–4.0)
MCH: 30.3 pg (ref 26.0–34.0)
MCHC: 32.9 g/dL (ref 30.0–36.0)
MCV: 92.1 fL (ref 80.0–100.0)
Monocytes Absolute: 0.5 10*3/uL (ref 0.1–1.0)
Monocytes Relative: 8 %
Neutro Abs: 3.7 10*3/uL (ref 1.7–7.7)
Neutrophils Relative %: 60 %
Platelet Count: 157 10*3/uL (ref 150–400)
RBC: 4.19 MIL/uL (ref 3.87–5.11)
RDW: 12.9 % (ref 11.5–15.5)
WBC Count: 6.1 10*3/uL (ref 4.0–10.5)
nRBC: 0 % (ref 0.0–0.2)

## 2024-03-31 LAB — CMP (CANCER CENTER ONLY)
ALT: 18 U/L (ref 0–44)
AST: 23 U/L (ref 15–41)
Albumin: 3.9 g/dL (ref 3.5–5.0)
Alkaline Phosphatase: 50 U/L (ref 38–126)
Anion gap: 8 (ref 5–15)
BUN: 20 mg/dL (ref 8–23)
CO2: 26 mmol/L (ref 22–32)
Calcium: 9 mg/dL (ref 8.9–10.3)
Chloride: 102 mmol/L (ref 98–111)
Creatinine: 0.79 mg/dL (ref 0.44–1.00)
GFR, Estimated: >60 mL/min (ref >60)
Glucose, Bld: 104 mg/dL — ABNORMAL HIGH (ref 70–99)
Potassium: 4 mmol/L (ref 3.5–5.1)
Sodium: 136 mmol/L (ref 135–145)
Total Bilirubin: 1 mg/dL (ref 0.0–1.2)
Total Protein: 7 g/dL (ref 6.5–8.1)

## 2024-03-31 MED ORDER — HEPARIN SOD (PORK) LOCK FLUSH 100 UNIT/ML IV SOLN
500.0000 [IU] | Freq: Once | INTRAVENOUS | Status: AC | PRN
  Administered 2024-03-31: 500 [IU]
  Filled 2024-03-31: qty 5

## 2024-03-31 MED ORDER — SODIUM CHLORIDE 0.9 % IV SOLN
200.0000 mg | Freq: Once | INTRAVENOUS | Status: AC
  Administered 2024-03-31: 200 mg via INTRAVENOUS
  Filled 2024-03-31: qty 8

## 2024-03-31 MED ORDER — SODIUM CHLORIDE 0.9 % IV SOLN
INTRAVENOUS | Status: DC
  Filled 2024-03-31: qty 250

## 2024-03-31 NOTE — Progress Notes (Signed)
 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  This was followed by a PET CT scan on 06/11/2023 which showed a 2.8 cm right level 2 lymph node with an SUV of 31.  Peripheral nodular hypermetabolism compatible with irregular rim enhancement.  12 mm nodular component along the inferior aspect of the lesion measuring SUV 10.1.  No other evidence of distant metastatic disease.  Core biopsy showed necrosis but was not conclusive for malignancy.  She was then referred to [facility] and underwent definitive excision of the right cervical lymph node on 07/17/2023.  Pathology showed malignant neoplasm with extensive necrosis consistent with metastatic melanoma.  BRAF negative surgical margins negative.  Right level 2A, 2B, 3 and 4 lymph nodes and right tonsil as well as right retromolar trigone biopsy all negative for malignancy. She was staged as stage IIIB T0 N1b M0.  She received adjuvant radiation therapy in [facility].  Pros and cons of adjuvant immunotherapy discussed with the patient and patient chose to receive care closer to home in [place].  Interval history-tolerating treatments well so far.  No new areas of skin rash and prior areas of skin rash have some changes of hypopigmentation.  ECOG PS- 1 Pain scale- 0   Review of systems- Review of Systems  Constitutional:  Negative for chills,  fever, malaise/fatigue and weight loss.  HENT:  Negative for congestion, ear discharge and nosebleeds.   Eyes:  Negative for blurred vision.  Respiratory:  Negative for cough, hemoptysis, sputum production, shortness of breath and wheezing.   Cardiovascular:  Negative for chest pain, palpitations, orthopnea and claudication.  Gastrointestinal:  Negative for abdominal pain, blood in stool, constipation, diarrhea, heartburn, melena, nausea and vomiting.  Genitourinary:  Negative for dysuria, flank pain, frequency, hematuria and urgency.  Musculoskeletal:  Negative for back pain, joint pain and myalgias.  Skin:  Negative for rash.  Neurological:  Negative for dizziness, tingling, focal weakness, seizures, weakness and headaches.  Endo/Heme/Allergies:  Does not bruise/bleed easily.  Psychiatric/Behavioral:  Negative for depression and suicidal ideas. The patient does not have insomnia.       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   IR IMAGING GUIDED PORT INSERTION  08/21/2023  KNEE ARTHROSCOPY W/ MENISCAL REPAIR Left    PARTIAL KNEE ARTHROPLASTY Right 09/16/2017   PARTIAL KNEE ARTHROPLASTY Right 09/16/2017   Procedure: RIGHT UNICOMPARTMENTAL KNEE;  Surgeon: Vernetta Lonni GRADE, MD;  Location: [facility] OR;  Service: Orthopedics;  Laterality: Right;   SHOULDER ARTHROSCOPY Left 11/2018   TOTAL KNEE REVISION Right 06/15/2019   Procedure: RIGHT UNICOMPARTMENTAL  ARTHROPLASTY TO TOTAL KNEE ARTHROPLASTY;  Surgeon: Vernetta Lonni GRADE, MD;  Location: [facility] OR;  Service: Orthopedics;  Laterality: Right;   TUBAL LIGATION      Social History   Socioeconomic History   Marital status: Married    Spouse name: Ranisha Allaire   Number of children: 2   Years of education: Not on file   Highest education level: 12th grade  Occupational History   Occupation: Retired  Tobacco Use   Smoking status: Former    Current packs/day: 0.00    Types: Cigarettes    Quit date: 12/28/1986    Years since quitting: 37.2   Smokeless tobacco: Never  Vaping Use   Vaping status: Never Used  Substance and Sexual Activity   Alcohol use: No   Drug use: No   Sexual activity: Not Currently  Other Topics Concern   Not on file  Social History Narrative   Not on file   Social Drivers of Health   Financial Resource Strain: Low Risk  (03/10/2024)   Overall Financial Resource Strain (CARDIA)    Difficulty of Paying Living Expenses: Not hard at all  Food Insecurity: No Food Insecurity (03/10/2024)   Hunger Vital Sign    Worried About Running Out of Food in the Last Year: Never true    Ran Out of Food in the Last Year: Never true  Transportation Needs: No Transportation Needs (03/10/2024)   PRAPARE - Administrator, Civil Service (Medical): No    Lack of Transportation (Non-Medical): No  Physical Activity: Sufficiently Active (03/10/2024)   Exercise Vital Sign    Days of Exercise per Week: 5 days    Minutes of Exercise per Session: 30 min  Stress: No Stress Concern Present (03/10/2024)   [name]    Feeling of Stress : Not at all  Social Connections: Socially Integrated (03/10/2024)   Social Connection and Isolation Panel    Frequency of Communication with Friends and Family: More than three times a week    Frequency of Social Gatherings with Friends and Family: Once a week    Attends Religious  Services: More than 4 times per year    Active Member of [employer] or Organizations: Yes    Attends Engineer, structural: More than 4 times per year    Marital Status: Married  Catering manager Violence: Not At Risk (01/12/2024)   Received from [facility]   Humiliation, Afraid, Rape, and Kick questionnaire    Within the last year, have you been afraid of your partner or ex-partner?: No    Within the last year, have you been humiliated or emotionally abused in other ways by your partner or ex-partner?: No    Within the last year, have you been kicked, hit, slapped, or otherwise physically hurt by your partner or ex-partner?: No    Within the last year, have you been raped or forced to have any kind of sexual activity by your partner or ex-partner?: No    Family History  Problem Relation Age of Onset   Breast cancer Neg Hx  Current Outpatient Medications:    acetaminophen  (TYLENOL ) 325 MG tablet, Take by mouth., Disp: , Rfl:    aspirin  EC 81 MG tablet, Take 81 mg by mouth daily., Disp: , Rfl:    atorvastatin  (LIPITOR) 10 MG tablet, TAKE 1 TABLET BY MOUTH EVERY DAY, Disp: 90 tablet, Rfl: 1   Blood Glucose Monitoring Suppl DEVI, Use to check blood sugar twice a day.  DX R73.03. May substitute to any manufacturer covered by patient's insurance., Disp: 1 each, Rfl: 0   Calcium  Carb-Cholecalciferol  600-800 MG-UNIT TABS, Take 1 tablet by mouth 2 (two) times daily. , Disp: , Rfl:    cetirizine (ZYRTEC) 10 MG tablet, Take 10 mg by mouth daily., Disp: , Rfl:    clotrimazole -betamethasone  (LOTRISONE ) cream, APPLY 1-2 TIMES A DAY AS NEEDED FOR SPOT TREATMENT FOR YEAST SKIN RASH, Disp: 30 g, Rfl: 1   esomeprazole  (NEXIUM ) 20 MG capsule, TAKE 1 CAPSULE (20 MG TOTAL) BY MOUTH 2 (TWO) TIMES DAILY BEFORE A MEAL., Disp: 180 capsule, Rfl: 0   fluticasone  (FLONASE ) 50 MCG/ACT nasal spray, Place 2 sprays into the nose at bedtime. , Disp: , Rfl:    Glucosamine-Chondroitin (COSAMIN DS PO), Take 1  tablet by mouth 2 (two) times daily., Disp: , Rfl:    glucose blood (ONETOUCH VERIO) test strip, USE TO CHECK BLOOD SUGAR TWICE A DAY. DX R73.03., Disp: 100 strip, Rfl: 1   Lancets Misc. MISC, Use to check blood sugar twice a day.  DX R73.03. May substitute to any manufacturer covered by patient's insurance., Disp: 100 each, Rfl: 0   lidocaine  (XYLOCAINE ) 2 % solution, 10 mLs., Disp: , Rfl:    lidocaine -prilocaine  (EMLA ) cream, Apply to affected area once, Disp: 30 g, Rfl: 3   Multiple Vitamins-Minerals (MULTIVITAMIN WITH MINERALS) tablet, Take 1 tablet by mouth every evening. Women's One-A-Day, Disp: , Rfl:    nystatin  (MYCOSTATIN /NYSTOP ) powder, APPLY TO AFFECTED AREA 3 TIMES A DAY, Disp: 30 g, Rfl: 1   Omega-3 Fatty Acids (FISH OIL) 1000 MG CAPS, Take 1,000 mg by mouth daily., Disp: , Rfl:    ondansetron  (ZOFRAN ) 8 MG tablet, Take 1 tablet (8 mg total) by mouth every 8 (eight) hours as needed for nausea or vomiting., Disp: 30 tablet, Rfl: 1   pilocarpine  (SALAGEN ) 5 MG tablet, Take 1 tablet (5 mg total) by mouth 3 (three) times daily. For dry mouth from radiation (Patient not taking: Reported on 03/23/2024), Disp: 90 tablet, Rfl: 1   prochlorperazine  (COMPAZINE ) 10 MG tablet, Take 1 tablet (10 mg total) by mouth every 6 (six) hours as needed for nausea or vomiting., Disp: 30 tablet, Rfl: 1   TURMERIC PO, Take 538 mg by mouth daily., Disp: , Rfl:  No current facility-administered medications for this visit.  Facility-Administered Medications Ordered in Other Visits:    0.9 %  sodium chloride  infusion, , Intravenous, Continuous, Melanee Annah BROCKS, MD, Stopped at 03/31/24 1226  Physical exam:  Vitals:   03/31/24 1026  BP: 136/64  Pulse: 67  Resp: 18  Temp: (!) 96.8 F (36 C)  TempSrc: Tympanic  SpO2: 96%  Weight: 164 lb 6.4 oz (74.6 kg)  Height: 5' 1 (1.549 m)   Physical Exam  Cardiovascular:     Rate and Rhythm: Normal rate and regular rhythm.     Heart sounds: Normal heart sounds.   Pulmonary:     Effort: Pulmonary effort is normal.     Breath sounds: Normal breath sounds.  Abdominal:     General: Bowel  sounds are normal.   Skin:    General: Skin is warm and dry.   Neurological:     Mental Status: She is alert and oriented to person, place, and time.      I have personally reviewed labs listed below:    Latest Ref Rng & Units 03/31/2024    9:50 AM  CMP  Glucose 70 - 99 mg/dL 895   BUN 8 - 23 mg/dL 20   Creatinine 9.55 - 1.00 mg/dL 9.20   Sodium 864 - 854 mmol/L 136   Potassium 3.5 - 5.1 mmol/L 4.0   Chloride 98 - 111 mmol/L 102   CO2 22 - 32 mmol/L 26   Calcium  8.9 - 10.3 mg/dL 9.0   Total Protein 6.5 - 8.1 g/dL 7.0   Total Bilirubin 0.0 - 1.2 mg/dL 1.0   Alkaline Phos 38 - 126 U/L 50   AST 15 - 41 U/L 23   ALT 0 - 44 U/L 18       Latest Ref Rng & Units 03/31/2024    9:50 AM  CBC  WBC 4.0 - 10.5 K/uL 6.1   Hemoglobin 12.0 - 15.0 g/dL 87.2   Hematocrit 63.9 - 46.0 % 38.6   Platelets 150 - 400 K/uL 157    I have personally reviewed Radiology images listed below: No images are attached to the encounter.  DG Finger Index Left Result Date: 03/15/2024 CLINICAL DATA:  pain injury laceration. EXAM: LEFT INDEX FINGER 2+V COMPARISON:  None Available. FINDINGS: No acute fracture or dislocation. No aggressive osseous lesion. Mild diffuse arthritis of imaged joints. No radiopaque foreign bodies. Soft tissues are within normal limits. IMPRESSION: *No acute osseous abnormality of the left index finger. Electronically Signed   By: Ree Molt M.D.   On: 03/15/2024 11:45     Assessment and plan- Patient is a 83 y.o. 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  She is here for treatment assessment prior to cycle 11 of adjuvant Keytruda   Counts okay to proceed with cycle 11 of adjuvant Keytruda  today.  She will directly proceed with cycle 12 in 3 weeks and I will see her back in 6 weeks for cycle 13.  Patient will be  completing 1 year of treatment in November 2025.  I will consider repeating PET scan in September 2025.   Visit Diagnosis 1. Encounter for antineoplastic immunotherapy   2. Melanoma of skin (HCC)      Dr. Annah Skene, MD, MPH [facility] [phone] 03/31/2024 1:17 PM

## 2024-04-12 DIAGNOSIS — C760 Malignant neoplasm of head, face and neck: Secondary | ICD-10-CM | POA: Diagnosis not present

## 2024-04-16 ENCOUNTER — Other Ambulatory Visit: Payer: Self-pay | Admitting: Internal Medicine

## 2024-04-17 NOTE — Telephone Encounter (Signed)
 Requested Prescriptions  Pending Prescriptions Disp Refills   ONETOUCH VERIO test strip [Pharmacy Med Name: ONE TOUCH VERIO TEST STRIP] 100 strip 1    Sig: USE TO CHECK BLOOD SUGAR TWICE A DAY. DX R73.03.     Endocrinology: Diabetes - Testing Supplies Passed - 04/17/2024  1:24 PM      Passed - Valid encounter within last 12 months    Recent Outpatient Visits           3 weeks ago Laceration of left index finger without foreign body without damage to nail, subsequent encounter   [facility] [place], [place] W, NP   1 month ago Class 1 obesity due to excess calories with serious comorbidity and body mass index (BMI) of 30.0 to 30.9 in adult   [facility] [place], Angeline ORN, NP   7 years ago Annual physical exam   [facility] H[facility] Elspeth DELENA, MD   8 years ago Cerumen impaction, bilateral   [facility] Mario Million, MD   8 years ago Cough   [facility] Mario Million, MD

## 2024-04-19 DIAGNOSIS — M542 Cervicalgia: Secondary | ICD-10-CM | POA: Diagnosis not present

## 2024-04-19 DIAGNOSIS — Z79899 Other long term (current) drug therapy: Secondary | ICD-10-CM | POA: Diagnosis not present

## 2024-04-19 DIAGNOSIS — Z881 Allergy status to other antibiotic agents status: Secondary | ICD-10-CM | POA: Diagnosis not present

## 2024-04-19 DIAGNOSIS — C434 Malignant melanoma of scalp and neck: Secondary | ICD-10-CM | POA: Diagnosis not present

## 2024-04-19 DIAGNOSIS — Z7982 Long term (current) use of aspirin: Secondary | ICD-10-CM | POA: Diagnosis not present

## 2024-04-19 DIAGNOSIS — Z87891 Personal history of nicotine dependence: Secondary | ICD-10-CM | POA: Diagnosis not present

## 2024-04-19 DIAGNOSIS — R221 Localized swelling, mass and lump, neck: Secondary | ICD-10-CM | POA: Diagnosis not present

## 2024-04-19 DIAGNOSIS — Z96659 Presence of unspecified artificial knee joint: Secondary | ICD-10-CM | POA: Diagnosis not present

## 2024-04-19 DIAGNOSIS — R490 Dysphonia: Secondary | ICD-10-CM | POA: Diagnosis not present

## 2024-04-21 ENCOUNTER — Inpatient Hospital Stay: Attending: Oncology

## 2024-04-21 ENCOUNTER — Inpatient Hospital Stay

## 2024-04-21 VITALS — BP 135/61 | HR 66 | Temp 96.5°F | Resp 18 | Wt 165.3 lb

## 2024-04-21 DIAGNOSIS — C770 Secondary and unspecified malignant neoplasm of lymph nodes of head, face and neck: Secondary | ICD-10-CM | POA: Insufficient documentation

## 2024-04-21 DIAGNOSIS — Z5112 Encounter for antineoplastic immunotherapy: Secondary | ICD-10-CM | POA: Insufficient documentation

## 2024-04-21 DIAGNOSIS — Z79899 Other long term (current) drug therapy: Secondary | ICD-10-CM | POA: Diagnosis not present

## 2024-04-21 DIAGNOSIS — C439 Malignant melanoma of skin, unspecified: Secondary | ICD-10-CM

## 2024-04-21 LAB — CMP (CANCER CENTER ONLY)
ALT: 17 U/L (ref 0–44)
AST: 23 U/L (ref 15–41)
Albumin: 4 g/dL (ref 3.5–5.0)
Alkaline Phosphatase: 64 U/L (ref 38–126)
Anion gap: 5 (ref 5–15)
BUN: 22 mg/dL (ref 8–23)
CO2: 27 mmol/L (ref 22–32)
Calcium: 8.9 mg/dL (ref 8.9–10.3)
Chloride: 104 mmol/L (ref 98–111)
Creatinine: 0.65 mg/dL (ref 0.44–1.00)
GFR, Estimated: >60 mL/min (ref >60)
Glucose, Bld: 95 mg/dL (ref 70–99)
Potassium: 4 mmol/L (ref 3.5–5.1)
Sodium: 136 mmol/L (ref 135–145)
Total Bilirubin: 0.7 mg/dL (ref 0.0–1.2)
Total Protein: 6.9 g/dL (ref 6.5–8.1)

## 2024-04-21 LAB — CBC WITH DIFFERENTIAL (CANCER CENTER ONLY)
Abs Immature Granulocytes: 0.01 K/uL (ref 0.00–0.07)
Basophils Absolute: 0 K/uL (ref 0.0–0.1)
Basophils Relative: 1 %
Eosinophils Absolute: 0.1 K/uL (ref 0.0–0.5)
Eosinophils Relative: 2 %
HCT: 39 % (ref 36.0–46.0)
Hemoglobin: 12.9 g/dL (ref 12.0–15.0)
Immature Granulocytes: 0 %
Lymphocytes Relative: 36 %
Lymphs Abs: 1.9 K/uL (ref 0.7–4.0)
MCH: 30.3 pg (ref 26.0–34.0)
MCHC: 33.1 g/dL (ref 30.0–36.0)
MCV: 91.5 fL (ref 80.0–100.0)
Monocytes Absolute: 0.5 K/uL (ref 0.1–1.0)
Monocytes Relative: 8 %
Neutro Abs: 2.9 K/uL (ref 1.7–7.7)
Neutrophils Relative %: 53 %
Platelet Count: 162 K/uL (ref 150–400)
RBC: 4.26 MIL/uL (ref 3.87–5.11)
RDW: 12.9 % (ref 11.5–15.5)
WBC Count: 5.4 K/uL (ref 4.0–10.5)
nRBC: 0 % (ref 0.0–0.2)

## 2024-04-21 LAB — TSH: TSH: 2.671 u[IU]/mL (ref 0.350–4.500)

## 2024-04-21 MED ORDER — SODIUM CHLORIDE 0.9 % IV SOLN
200.0000 mg | Freq: Once | INTRAVENOUS | Status: AC
  Administered 2024-04-21: 200 mg via INTRAVENOUS
  Filled 2024-04-21: qty 8

## 2024-04-21 MED ORDER — HEPARIN SOD (PORK) LOCK FLUSH 100 UNIT/ML IV SOLN
500.0000 [IU] | Freq: Once | INTRAVENOUS | Status: AC | PRN
Start: 2024-04-21 — End: 2024-04-21
  Administered 2024-04-21: 500 [IU]
  Filled 2024-04-21: qty 5

## 2024-04-21 MED ORDER — SODIUM CHLORIDE 0.9 % IV SOLN
INTRAVENOUS | Status: DC
  Filled 2024-04-21: qty 250

## 2024-04-21 NOTE — Patient Instructions (Signed)
 [facility]  Discharge Instructions: Thank you for choosing [facility] to provide your oncology and hematology care.  If you have a lab appointment with the Cancer Center, please go directly to the Cancer Center and check in at the registration area.  Wear comfortable clothing and clothing appropriate for easy access to any Portacath or PICC line.   We strive to give you quality time with your provider. You may need to reschedule your appointment if you arrive late (15 or more minutes).  Arriving late affects you and other patients whose appointments are after yours.  Also, if you miss three or more appointments without notifying the office, you may be dismissed from the clinic at the provider's discretion.      For prescription refill requests, have your pharmacy contact our office and allow 72 hours for refills to be completed.    Today you received the following chemotherapy and/or immunotherapy agents Keytruda      To help prevent nausea and vomiting after your treatment, we encourage you to take your nausea medication as directed.  BELOW ARE SYMPTOMS THAT SHOULD BE REPORTED IMMEDIATELY: *FEVER GREATER THAN 100.4 F (38 C) OR HIGHER *CHILLS OR SWEATING *NAUSEA AND VOMITING THAT IS NOT CONTROLLED WITH YOUR NAUSEA MEDICATION *UNUSUAL SHORTNESS OF BREATH *UNUSUAL BRUISING OR BLEEDING *URINARY PROBLEMS (pain or burning when urinating, or frequent urination) *BOWEL PROBLEMS (unusual diarrhea, constipation, pain near the anus) TENDERNESS IN MOUTH AND THROAT WITH OR WITHOUT PRESENCE OF ULCERS (sore throat, sores in mouth, or a toothache) UNUSUAL RASH, SWELLING OR PAIN  UNUSUAL VAGINAL DISCHARGE OR ITCHING   Items with * indicate a potential emergency and should be followed up as soon as possible or go to the Emergency Department if any problems should occur.  Please show the CHEMOTHERAPY ALERT CARD or IMMUNOTHERAPY  ALERT CARD at check-in to the Emergency Department and triage nurse.  Should you have questions after your visit or need to cancel or reschedule your appointment, please contact [facility] - A DEPT OF [facility]  [phone] and follow the prompts.  Office hours are 8:00 a.m. to 4:30 p.m. Monday - Friday. Please note that voicemails left after 4:00 p.m. may not be returned until the following business day.  We are closed weekends and major holidays. You have access to a nurse at all times for urgent questions. Please call the main number to the clinic [phone] and follow the prompts.  For any non-urgent questions, you may also contact your provider using MyChart. We now offer e-Visits for anyone 58 and older to request care online for non-urgent symptoms. For details visit [email or web address].   Also download the MyChart app! Go to the app store, search "MyChart", open the app, select [place], and log in with your MyChart username and password.

## 2024-04-22 LAB — T4: T4, Total: 6.1 ug/dL (ref 4.5–12.0)

## 2024-05-03 ENCOUNTER — Other Ambulatory Visit: Payer: Self-pay | Admitting: Internal Medicine

## 2024-05-03 DIAGNOSIS — B372 Candidiasis of skin and nail: Secondary | ICD-10-CM

## 2024-05-04 NOTE — Telephone Encounter (Signed)
 Requested medication (s) are due for refill today: yes  Requested medication (s) are on the active medication list: yes  Last refill:  05/16/23  Future visit scheduled: yes  Notes to clinic:  Medication not assigned to a protocol, review manually.      Requested Prescriptions  Pending Prescriptions Disp Refills   clotrimazole -betamethasone  (LOTRISONE ) cream [Pharmacy Med Name: CLOTRIMAZOLE -BETAMETHASONE  CRM] 30 g 1    Sig: APPLY 1-2 TIMES A DAY AS NEEDED FOR SPOT TREATMENT FOR YEAST SKIN RASH     Off-Protocol Failed - 05/04/2024  3:42 PM      Failed - Medication not assigned to a protocol, review manually.      Passed - Valid encounter within last 12 months    Recent Outpatient Visits           1 month ago Laceration of left index finger without foreign body without damage to nail, subsequent encounter   [facility] [place], [place] W, NP   1 month ago Class 1 obesity due to excess calories with serious comorbidity and body mass index (BMI) of 30.0 to 30.9 in adult   [facility], Angeline ORN, NP   7 years ago Annual physical exam   [facility] Elspeth DELENA, MD   8 years ago Cerumen impaction, bilateral   [facility] Mario Million, MD   8 years ago Cough   [facility] Mario Million, MD

## 2024-05-12 ENCOUNTER — Inpatient Hospital Stay: Attending: Oncology

## 2024-05-12 ENCOUNTER — Inpatient Hospital Stay

## 2024-05-12 ENCOUNTER — Other Ambulatory Visit: Payer: Self-pay

## 2024-05-12 ENCOUNTER — Encounter: Payer: Self-pay | Admitting: Oncology

## 2024-05-12 ENCOUNTER — Inpatient Hospital Stay (HOSPITAL_BASED_OUTPATIENT_CLINIC_OR_DEPARTMENT_OTHER): Admitting: Oncology

## 2024-05-12 VITALS — BP 140/58 | HR 73 | Temp 95.0°F | Resp 18

## 2024-05-12 VITALS — BP 123/63 | HR 79 | Temp 96.7°F | Resp 17 | Ht 61.0 in | Wt 165.5 lb

## 2024-05-12 DIAGNOSIS — C770 Secondary and unspecified malignant neoplasm of lymph nodes of head, face and neck: Secondary | ICD-10-CM | POA: Diagnosis not present

## 2024-05-12 DIAGNOSIS — Z5112 Encounter for antineoplastic immunotherapy: Secondary | ICD-10-CM | POA: Diagnosis not present

## 2024-05-12 DIAGNOSIS — Z87891 Personal history of nicotine dependence: Secondary | ICD-10-CM | POA: Insufficient documentation

## 2024-05-12 DIAGNOSIS — C439 Malignant melanoma of skin, unspecified: Secondary | ICD-10-CM

## 2024-05-12 DIAGNOSIS — Z79899 Other long term (current) drug therapy: Secondary | ICD-10-CM | POA: Insufficient documentation

## 2024-05-12 LAB — CBC WITH DIFFERENTIAL (CANCER CENTER ONLY)
Abs Immature Granulocytes: 0.01 K/uL (ref 0.00–0.07)
Basophils Absolute: 0 K/uL (ref 0.0–0.1)
Basophils Relative: 0 %
Eosinophils Absolute: 0.1 K/uL (ref 0.0–0.5)
Eosinophils Relative: 1 %
HCT: 40.4 % (ref 36.0–46.0)
Hemoglobin: 13.4 g/dL (ref 12.0–15.0)
Immature Granulocytes: 0 %
Lymphocytes Relative: 23 %
Lymphs Abs: 1.6 K/uL (ref 0.7–4.0)
MCH: 30.5 pg (ref 26.0–34.0)
MCHC: 33.2 g/dL (ref 30.0–36.0)
MCV: 91.8 fL (ref 80.0–100.0)
Monocytes Absolute: 0.5 K/uL (ref 0.1–1.0)
Monocytes Relative: 7 %
Neutro Abs: 4.8 K/uL (ref 1.7–7.7)
Neutrophils Relative %: 69 %
Platelet Count: 155 K/uL (ref 150–400)
RBC: 4.4 MIL/uL (ref 3.87–5.11)
RDW: 12.5 % (ref 11.5–15.5)
WBC Count: 7 K/uL (ref 4.0–10.5)
nRBC: 0 % (ref 0.0–0.2)

## 2024-05-12 LAB — CMP (CANCER CENTER ONLY)
ALT: 16 U/L (ref 0–44)
AST: 22 U/L (ref 15–41)
Albumin: 3.7 g/dL (ref 3.5–5.0)
Alkaline Phosphatase: 51 U/L (ref 38–126)
Anion gap: 8 (ref 5–15)
BUN: 17 mg/dL (ref 8–23)
CO2: 23 mmol/L (ref 22–32)
Calcium: 8.4 mg/dL — ABNORMAL LOW (ref 8.9–10.3)
Chloride: 107 mmol/L (ref 98–111)
Creatinine: 0.57 mg/dL (ref 0.44–1.00)
GFR, Estimated: >60 mL/min (ref >60)
Glucose, Bld: 116 mg/dL — ABNORMAL HIGH (ref 70–99)
Potassium: 3.8 mmol/L (ref 3.5–5.1)
Sodium: 138 mmol/L (ref 135–145)
Total Bilirubin: 0.7 mg/dL (ref 0.0–1.2)
Total Protein: 6.4 g/dL — ABNORMAL LOW (ref 6.5–8.1)

## 2024-05-12 MED ORDER — SODIUM CHLORIDE 0.9 % IV SOLN
INTRAVENOUS | Status: DC
  Filled 2024-05-12: qty 250

## 2024-05-12 MED ORDER — SODIUM CHLORIDE 0.9 % IV SOLN
200.0000 mg | Freq: Once | INTRAVENOUS | Status: AC
  Administered 2024-05-12: 200 mg via INTRAVENOUS
  Filled 2024-05-12: qty 8

## 2024-05-12 NOTE — Progress Notes (Signed)
 Goes through loose stool spells (not diarrhea); also has collitis.  CT scan done in [place] on 04/12/24; saw surgeon on 7/14 and was told nothing new has come up. Has overactive bladder; has appt on 8/14, will have botox injections. Indigestion takes nexium .

## 2024-05-12 NOTE — Progress Notes (Signed)
 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  This was followed by a PET CT scan on 06/11/2023 which showed a 2.8 cm right level 2 lymph node with an SUV of 31.  Peripheral nodular hypermetabolism compatible with irregular rim enhancement.  12 mm nodular component along the inferior aspect of the lesion measuring SUV 10.1.  No other evidence of distant metastatic disease.  Core biopsy showed necrosis but was not conclusive for malignancy.  She was then referred to [facility] and underwent definitive excision of the right cervical lymph node on 07/17/2023.  Pathology showed malignant neoplasm with extensive necrosis consistent with metastatic melanoma.  BRAF negative surgical margins negative.  Right level 2A, 2B, 3 and 4 lymph nodes and right tonsil as well as right retromolar trigone biopsy all negative for malignancy. She was staged as stage IIIB T0 N1b M0.  She received adjuvant radiation therapy in [facility].  Pros and cons of adjuvant immunotherapy discussed with the patient and patient chose to receive care closer to home in [place].  Interval history-patient is doing well presently and denies any complaints at this time.  Denies any nausea vomiting or diarrhea tolerating Keytruda  well  ECOG PS- 1 Pain scale- 0  Review of systems- Review of Systems  Constitutional:  Negative for  chills, fever, malaise/fatigue and weight loss.  HENT:  Negative for congestion, ear discharge and nosebleeds.   Eyes:  Negative for blurred vision.  Respiratory:  Negative for cough, hemoptysis, sputum production, shortness of breath and wheezing.   Cardiovascular:  Negative for chest pain, palpitations, orthopnea and claudication.  Gastrointestinal:  Negative for abdominal pain, blood in stool, constipation, diarrhea, heartburn, melena, nausea and vomiting.  Genitourinary:  Negative for dysuria, flank pain, frequency, hematuria and urgency.  Musculoskeletal:  Negative for back pain, joint pain and myalgias.  Skin:  Negative for rash.  Neurological:  Negative for dizziness, tingling, focal weakness, seizures, weakness and headaches.  Endo/Heme/Allergies:  Does not bruise/bleed easily.  Psychiatric/Behavioral:  Negative for depression and suicidal ideas. The patient does not have insomnia.       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   IR IMAGING GUIDED PORT INSERTION  08/21/2023   KNEE  ARTHROSCOPY W/ MENISCAL REPAIR Left    PARTIAL KNEE ARTHROPLASTY Right 09/16/2017   PARTIAL KNEE ARTHROPLASTY Right 09/16/2017   Procedure: RIGHT UNICOMPARTMENTAL KNEE;  Surgeon: Vernetta Lonni GRADE, MD;  Location: [facility] OR;  Service: Orthopedics;  Laterality: Right;   SHOULDER ARTHROSCOPY Left 11/2018   TOTAL KNEE REVISION Right 06/15/2019   Procedure: RIGHT UNICOMPARTMENTAL  ARTHROPLASTY TO TOTAL KNEE ARTHROPLASTY;  Surgeon: Vernetta Lonni GRADE, MD;  Location: [facility] OR;  Service: Orthopedics;  Laterality: Right;   TUBAL LIGATION      Social History   Socioeconomic History   Marital status: Married    Spouse name: Courtny Bennison   Number of children: 2   Years of education: Not on file   Highest education level: 12th grade  Occupational History   Occupation: Retired  Tobacco Use   Smoking status: Former    Current packs/day: 0.00    Types: Cigarettes    Quit date: 12/28/1986    Years since quitting: 37.3   Smokeless tobacco: Never  Vaping Use   Vaping status: Never Used  Substance and Sexual Activity   Alcohol use: No   Drug use: No   Sexual activity: Not Currently  Other Topics Concern   Not on file  Social History Narrative   Not on file   Social Drivers of Health   Financial Resource Strain: Low Risk  (03/10/2024)   Overall Financial Resource Strain (CARDIA)    Difficulty of Paying Living Expenses: Not hard at all  Food Insecurity: No Food Insecurity (05/12/2024)   Hunger Vital Sign    Worried About Running Out of Food in the Last Year: Never true    Ran Out of Food in the Last Year: Never true  Transportation Needs: No Transportation Needs (05/12/2024)   PRAPARE - Administrator, Civil Service (Medical): No    Lack of Transportation (Non-Medical): No  Physical Activity: Sufficiently Active (03/10/2024)   Exercise Vital Sign    Days of Exercise per Week: 5 days    Minutes of Exercise per Session: 30 min  Stress: No Stress Concern Present (03/10/2024)   [name]    Feeling of Stress : Not at all  Social Connections: Socially Integrated (03/10/2024)   Social Connection and Isolation Panel    Frequency of Communication with Friends and Family: More than three times a week    Frequency of Social Gatherings with Friends and Family: Once a week    Attends Religious  Services: More than 4 times per year    Active Member of [employer] or Organizations: Yes    Attends Engineer, structural: More than 4 times per year    Marital Status: Married  Catering manager Violence: Not At Risk (05/12/2024)   Humiliation, Afraid, Rape, and Kick questionnaire    Fear of Current or Ex-Partner: No    Emotionally Abused: No    Physically Abused: No    Sexually Abused: No    Family History  Problem Relation Age of Onset   Breast cancer Neg Hx      Current Outpatient Medications:    acetaminophen  (TYLENOL ) 325 MG tablet, Take by mouth., Disp: , Rfl:    aspirin  EC 81 MG tablet, Take 81 mg by mouth daily., Disp: , Rfl:    atorvastatin  (LIPITOR) 10 MG tablet, TAKE 1 TABLET BY MOUTH EVERY DAY, Disp: 90 tablet, Rfl: 1   Blood Glucose Monitoring Suppl DEVI, Use to check blood sugar  twice a day.  DX R73.03. May substitute to any manufacturer covered by patient's insurance., Disp: 1 each, Rfl: 0   Calcium  Carb-Cholecalciferol  600-800 MG-UNIT TABS, Take 1 tablet by mouth 2 (two) times daily. , Disp: , Rfl:    cetirizine (ZYRTEC) 10 MG tablet, Take 10 mg by mouth daily., Disp: , Rfl:    clotrimazole -betamethasone  (LOTRISONE ) cream, APPLY 1-2 TIMES A DAY AS NEEDED FOR SPOT TREATMENT FOR YEAST SKIN RASH, Disp: 30 g, Rfl: 1   esomeprazole  (NEXIUM ) 20 MG capsule, TAKE 1 CAPSULE (20 MG TOTAL) BY MOUTH 2 (TWO) TIMES DAILY BEFORE A MEAL., Disp: 180 capsule, Rfl: 0   fluticasone  (FLONASE ) 50 MCG/ACT nasal spray, Place 2 sprays into the nose at bedtime. , Disp: , Rfl:    Glucosamine-Chondroitin (COSAMIN DS PO), Take 1 tablet by mouth 2 (two) times daily., Disp: , Rfl:    Lancets Misc. MISC, Use to check blood sugar twice a day.  DX R73.03. May substitute to any manufacturer covered by patient's insurance., Disp: 100 each, Rfl: 0   lidocaine  (XYLOCAINE ) 2 % solution, 10 mLs., Disp: , Rfl:    lidocaine -prilocaine  (EMLA ) cream, Apply to affected area once, Disp: 30 g, Rfl: 3   Multiple  Vitamins-Minerals (MULTIVITAMIN WITH MINERALS) tablet, Take 1 tablet by mouth every evening. Women's One-A-Day, Disp: , Rfl:    nystatin  (MYCOSTATIN /NYSTOP ) powder, APPLY TO AFFECTED AREA 3 TIMES A DAY, Disp: 30 g, Rfl: 1   Omega-3 Fatty Acids (FISH OIL) 1000 MG CAPS, Take 1,000 mg by mouth daily., Disp: , Rfl:    ondansetron  (ZOFRAN ) 8 MG tablet, Take 1 tablet (8 mg total) by mouth every 8 (eight) hours as needed for nausea or vomiting., Disp: 30 tablet, Rfl: 1   ONETOUCH VERIO test strip, USE TO CHECK BLOOD SUGAR TWICE A DAY. DX R73.03., Disp: 100 strip, Rfl: 1   prochlorperazine  (COMPAZINE ) 10 MG tablet, Take 1 tablet (10 mg total) by mouth every 6 (six) hours as needed for nausea or vomiting., Disp: 30 tablet, Rfl: 1   TURMERIC PO, Take 538 mg by mouth daily., Disp: , Rfl:    pilocarpine  (SALAGEN ) 5 MG tablet, Take 1 tablet (5 mg total) by mouth 3 (three) times daily. For dry mouth from radiation (Patient not taking: Reported on 05/12/2024), Disp: 90 tablet, Rfl: 1 No current facility-administered medications for this visit.  Facility-Administered Medications Ordered in Other Visits:    0.9 %  sodium chloride  infusion, , Intravenous, Continuous, Melanee Annah BROCKS, MD, Stopped at 05/12/24 1132  Physical exam:  Vitals:   05/12/24 0927  BP: 123/63  Pulse: 79  Resp: 17  Temp: (!) 96.7 F (35.9 C)  TempSrc: Tympanic  SpO2: 97%  Weight: 165 lb 8 oz (75.1 kg)  Height: 5' 1 (1.549 m)   Physical Exam Cardiovascular:     Rate and Rhythm: Normal rate and regular rhythm.     Heart sounds: Normal heart sounds.  Pulmonary:     Effort: Pulmonary effort is normal.     Breath sounds: Normal breath sounds.  Abdominal:     General: Bowel sounds are normal.     Palpations: Abdomen is soft.  Skin:    General: Skin is warm and dry.  Neurological:     Mental Status: She is alert and oriented to person, place, and time.      I have personally reviewed labs listed below:    Latest Ref Rng & Units  05/12/2024    8:51 AM  CMP  Glucose 70 - 99 mg/dL 883   BUN 8 - 23 mg/dL 17   Creatinine 9.55 - 1.00 mg/dL 9.42   Sodium 864 - 854 mmol/L 138   Potassium 3.5 - 5.1 mmol/L 3.8   Chloride 98 - 111 mmol/L 107   CO2 22 - 32 mmol/L 23   Calcium  8.9 - 10.3 mg/dL 8.4   Total Protein 6.5 - 8.1 g/dL 6.4   Total Bilirubin 0.0 - 1.2 mg/dL 0.7   Alkaline Phos 38 - 126 U/L 51   AST 15 - 41 U/L 22   ALT 0 - 44 U/L 16       Latest Ref Rng & Units 05/12/2024    8:51 AM  CBC  WBC 4.0 - 10.5 K/uL 7.0   Hemoglobin 12.0 - 15.0 g/dL 86.5   Hematocrit 63.9 - 46.0 % 40.4   Platelets 150 - 400 K/uL 155      Assessment and plan- Patient is a 83 y.o. female  with history of stage IIIb malignant melanoma T0 N1 M0 of unknown primary.  She is here for on treatment assessment prior to cycle 12 of adjuvant Keytruda   Counts okay to proceed with cycle 12 of adjuvant Keytruda  today.  I will see her back in 3 weeks for cycle 13.  I will plan to repeat PET scan in about 6 weeks.  She completes 1 year of adjuvant treatment in November 2025.   Visit Diagnosis 1. Melanoma of skin (HCC)   2. Encounter for antineoplastic immunotherapy      Dr. Annah Skene, MD, MPH [facility] at [facility] [phone] 05/12/2024 2:09 PM

## 2024-05-12 NOTE — Patient Instructions (Signed)
 [facility] A DEPT [facility]  Discharge Instructions: Thank you for choosing [facility] to provide your oncology and hematology care.  If you have a lab appointment with the Cancer Center, please go directly to the Cancer Center and check in at the registration area.  Wear comfortable clothing and clothing appropriate for easy access to any Portacath or PICC line.   We strive to give you quality time with your provider. You may need to reschedule your appointment if you arrive late (15 or more minutes).  Arriving late affects you and other patients whose appointments are after yours.  Also, if you miss three or more appointments without notifying the office, you may be dismissed from the clinic at the provider's discretion.      For prescription refill requests, have your pharmacy contact our office and allow 72 hours for refills to be completed.    Today you received the following chemotherapy and/or immunotherapy agents [name]      To help prevent nausea and vomiting after your treatment, we encourage you to take your nausea medication as directed.  BELOW ARE SYMPTOMS THAT SHOULD BE REPORTED IMMEDIATELY: *FEVER GREATER THAN 100.4 F (38 C) OR HIGHER *CHILLS OR SWEATING *NAUSEA AND VOMITING THAT IS NOT CONTROLLED WITH YOUR NAUSEA MEDICATION *UNUSUAL SHORTNESS OF BREATH *UNUSUAL BRUISING OR BLEEDING *URINARY PROBLEMS (pain or burning when urinating, or frequent urination) *BOWEL PROBLEMS (unusual diarrhea, constipation, pain near the anus) TENDERNESS IN MOUTH AND THROAT WITH OR WITHOUT PRESENCE OF ULCERS (sore throat, sores in mouth, or a toothache) UNUSUAL RASH, SWELLING OR PAIN  UNUSUAL VAGINAL DISCHARGE OR ITCHING   Items with * indicate a potential emergency and should be followed up as soon as possible or go to the Emergency Department if any problems should occur.  Please show the CHEMOTHERAPY ALERT CARD or IMMUNOTHERAPY  ALERT CARD at check-in to the Emergency Department and triage nurse.  Should you have questions after your visit or need to cancel or reschedule your appointment, please contact [facility] - A DEPT OF [facility]  [phone] and follow the prompts.  Office hours are 8:00 a.m. to 4:30 p.m. Monday - Friday. Please note that voicemails left after 4:00 p.m. may not be returned until the following business day.  We are closed weekends and major holidays. You have access to a nurse at all times for urgent questions. Please call the main number to the clinic [phone] and follow the prompts.  For any non-urgent questions, you may also contact your provider using MyChart. We now offer e-Visits for anyone 48 and older to request care online for non-urgent symptoms. For details visit [email or web address].   Also download the MyChart app! Go to the app store, search "MyChart", open the app, select [place], and log in with your MyChart username and password.

## 2024-05-20 DIAGNOSIS — N3941 Urge incontinence: Secondary | ICD-10-CM | POA: Diagnosis not present

## 2024-05-27 ENCOUNTER — Encounter: Payer: Self-pay | Admitting: Oncology

## 2024-06-02 ENCOUNTER — Inpatient Hospital Stay

## 2024-06-02 ENCOUNTER — Inpatient Hospital Stay (HOSPITAL_BASED_OUTPATIENT_CLINIC_OR_DEPARTMENT_OTHER): Admitting: Oncology

## 2024-06-02 ENCOUNTER — Encounter: Payer: Self-pay | Admitting: Oncology

## 2024-06-02 VITALS — BP 138/62 | HR 67 | Temp 96.4°F | Resp 17 | Wt 164.0 lb

## 2024-06-02 VITALS — BP 136/65

## 2024-06-02 DIAGNOSIS — C439 Malignant melanoma of skin, unspecified: Secondary | ICD-10-CM

## 2024-06-02 DIAGNOSIS — Z5112 Encounter for antineoplastic immunotherapy: Secondary | ICD-10-CM | POA: Diagnosis not present

## 2024-06-02 LAB — CMP (CANCER CENTER ONLY)
ALT: 18 U/L (ref 0–44)
AST: 25 U/L (ref 15–41)
Albumin: 3.7 g/dL (ref 3.5–5.0)
Alkaline Phosphatase: 54 U/L (ref 38–126)
Anion gap: 9 (ref 5–15)
BUN: 19 mg/dL (ref 8–23)
CO2: 25 mmol/L (ref 22–32)
Calcium: 9 mg/dL (ref 8.9–10.3)
Chloride: 104 mmol/L (ref 98–111)
Creatinine: 0.67 mg/dL (ref 0.44–1.00)
GFR, Estimated: >60 mL/min (ref >60)
Glucose, Bld: 107 mg/dL — ABNORMAL HIGH (ref 70–99)
Potassium: 3.9 mmol/L (ref 3.5–5.1)
Sodium: 138 mmol/L (ref 135–145)
Total Bilirubin: 0.8 mg/dL (ref 0.0–1.2)
Total Protein: 6.8 g/dL (ref 6.5–8.1)

## 2024-06-02 LAB — CBC WITH DIFFERENTIAL (CANCER CENTER ONLY)
Abs Immature Granulocytes: 0.02 K/uL (ref 0.00–0.07)
Basophils Absolute: 0 K/uL (ref 0.0–0.1)
Basophils Relative: 1 %
Eosinophils Absolute: 0.1 K/uL (ref 0.0–0.5)
Eosinophils Relative: 2 %
HCT: 40 % (ref 36.0–46.0)
Hemoglobin: 13.3 g/dL (ref 12.0–15.0)
Immature Granulocytes: 0 %
Lymphocytes Relative: 37 %
Lymphs Abs: 2 K/uL (ref 0.7–4.0)
MCH: 30.6 pg (ref 26.0–34.0)
MCHC: 33.3 g/dL (ref 30.0–36.0)
MCV: 92 fL (ref 80.0–100.0)
Monocytes Absolute: 0.5 K/uL (ref 0.1–1.0)
Monocytes Relative: 8 %
Neutro Abs: 2.9 K/uL (ref 1.7–7.7)
Neutrophils Relative %: 52 %
Platelet Count: 170 K/uL (ref 150–400)
RBC: 4.35 MIL/uL (ref 3.87–5.11)
RDW: 12.2 % (ref 11.5–15.5)
WBC Count: 5.5 K/uL (ref 4.0–10.5)
nRBC: 0 % (ref 0.0–0.2)

## 2024-06-02 MED ORDER — SODIUM CHLORIDE 0.9 % IV SOLN
INTRAVENOUS | Status: DC
Start: 2024-06-02 — End: 2024-06-02
  Filled 2024-06-02: qty 250

## 2024-06-02 MED ORDER — SODIUM CHLORIDE 0.9 % IV SOLN
200.0000 mg | Freq: Once | INTRAVENOUS | Status: AC
  Administered 2024-06-02: 200 mg via INTRAVENOUS
  Filled 2024-06-02: qty 8

## 2024-06-02 NOTE — Patient Instructions (Signed)
 [facility] - A DEPT OF [facility]  Discharge Instructions: Thank you for choosing [facility] to provide your oncology and hematology care.  If you have a lab appointment with the Cancer Center, please go directly to the Cancer Center and check in at the registration area.  Wear comfortable clothing and clothing appropriate for easy access to any Portacath or PICC line.   We strive to give you quality time with your provider. You may need to reschedule your appointment if you arrive late (15 or more minutes).  Arriving late affects you and other patients whose appointments are after yours.  Also, if you miss three or more appointments without notifying the office, you may be dismissed from the clinic at the provider's discretion.      For prescription refill requests, have your pharmacy contact our office and allow 72 hours for refills to be completed.    Today you received the following chemotherapy and/or immunotherapy agents KEYTRDUA      To help prevent nausea and vomiting after your treatment, we encourage you to take your nausea medication as directed.  BELOW ARE SYMPTOMS THAT SHOULD BE REPORTED IMMEDIATELY: *FEVER GREATER THAN 100.4 F (38 C) OR HIGHER *CHILLS OR SWEATING *NAUSEA AND VOMITING THAT IS NOT CONTROLLED WITH YOUR NAUSEA MEDICATION *UNUSUAL SHORTNESS OF BREATH *UNUSUAL BRUISING OR BLEEDING *URINARY PROBLEMS (pain or burning when urinating, or frequent urination) *BOWEL PROBLEMS (unusual diarrhea, constipation, pain near the anus) TENDERNESS IN MOUTH AND THROAT WITH OR WITHOUT PRESENCE OF ULCERS (sore throat, sores in mouth, or a toothache) UNUSUAL RASH, SWELLING OR PAIN  UNUSUAL VAGINAL DISCHARGE OR ITCHING   Items with * indicate a potential emergency and should be followed up as soon as possible or go to the Emergency Department if any problems should occur.  Please show the CHEMOTHERAPY ALERT CARD or IMMUNOTHERAPY  ALERT CARD at check-in to the Emergency Department and triage nurse.  Should you have questions after your visit or need to cancel or reschedule your appointment, please contact [facility] - A DEPT OF [facility]  [phone] and follow the prompts.  Office hours are 8:00 a.m. to 4:30 p.m. Monday - Friday. Please note that voicemails left after 4:00 p.m. may not be returned until the following business day.  We are closed weekends and major holidays. You have access to a nurse at all times for urgent questions. Please call the main number to the clinic [phone] and follow the prompts.  For any non-urgent questions, you may also contact your provider using MyChart. We now offer e-Visits for anyone 58 and older to request care online for non-urgent symptoms. For details visit [email or web address].   Also download the MyChart app! Go to the app store, search MyChart, open the app, select [facility], and log in with your MyChart username and password.  Pembrolizumab  Injection What is this medication? PEMBROLIZUMAB  (PEM broe LIZ ue mab) treats some types of cancer. It works by helping your immune system slow or stop the spread of cancer cells. It is a monoclonal antibody. This medicine may be used for other purposes; ask your health care provider or pharmacist if you have questions. COMMON BRAND NAME(S): Keytruda  What should I tell my care team before I take this medication? They need to know if you have any of these conditions: Allogeneic stem cell transplant (uses someone else's stem cells) Autoimmune diseases, such as Crohn disease,  ulcerative colitis, lupus History of chest radiation Nervous system problems, such as Guillain-Barre syndrome, myasthenia gravis Organ transplant An unusual or allergic reaction to pembrolizumab , other medications, foods, dyes, or preservatives Pregnant or trying to get pregnant Breast-feeding How should I use this  medication? This medication is injected into a vein. It is given by your care team in a hospital or clinic setting. A special MedGuide will be given to you before each treatment. Be sure to read this information carefully each time. Talk to your care team about the use of this medication in children. While it may be prescribed for children as young as 6 months for selected conditions, precautions do apply. Overdosage: If you think you have taken too much of this medicine contact a poison control center or emergency room at once. NOTE: This medicine is only for you. Do not share this medicine with others. What if I miss a dose? Keep appointments for follow-up doses. It is important not to miss your dose. Call your care team if you are unable to keep an appointment. What may interact with this medication? Interactions have not been studied. This list may not describe all possible interactions. Give your health care provider a list of all the medicines, herbs, non-prescription drugs, or dietary supplements you use. Also tell them if you smoke, drink alcohol, or use illegal drugs. Some items may interact with your medicine. What should I watch for while using this medication? Your condition will be monitored carefully while you are receiving this medication. You may need blood work while taking this medication. This medication may cause serious skin reactions. They can happen weeks to months after starting the medication. Contact your care team right away if you notice fevers or flu-like symptoms with a rash. The rash may be red or purple and then turn into blisters or peeling of the skin. You may also notice a red rash with swelling of the face, lips, or lymph nodes in your neck or under your arms. Tell your care team right away if you have any change in your eyesight. Talk to your care team if you may be pregnant. Serious birth defects can occur if you take this medication during pregnancy and for 4  months after the last dose. You will need a negative pregnancy test before starting this medication. Contraception is recommended while taking this medication and for 4 months after the last dose. Your care team can help you find the option that works for you. Do not breastfeed while taking this medication and for 4 months after the last dose. What side effects may I notice from receiving this medication? Side effects that you should report to your care team as soon as possible: Allergic reactions--skin rash, itching, hives, swelling of the face, lips, tongue, or throat Dry cough, shortness of breath or trouble breathing Eye pain, redness, irritation, or discharge with blurry or decreased vision Heart muscle inflammation--unusual weakness or fatigue, shortness of breath, chest pain, fast or irregular heartbeat, dizziness, swelling of the ankles, feet, or hands Hormone gland problems--headache, sensitivity to light, unusual weakness or fatigue, dizziness, fast or irregular heartbeat, increased sensitivity to cold or heat, excessive sweating, constipation, hair loss, increased thirst or amount of urine, tremors or shaking, irritability Infusion reactions--chest pain, shortness of breath or trouble breathing, feeling faint or lightheaded Kidney injury (glomerulonephritis)--decrease in the amount of urine, red or dark brown urine, foamy or bubbly urine, swelling of the ankles, hands, or feet Liver injury--right upper belly pain, loss  of appetite, nausea, light-colored stool, dark yellow or brown urine, yellowing skin or eyes, unusual weakness or fatigue Pain, tingling, or numbness in the hands or feet, muscle weakness, change in vision, confusion or trouble speaking, loss of balance or coordination, trouble walking, seizures Rash, fever, and swollen lymph nodes Redness, blistering, peeling, or loosening of the skin, including inside the mouth Sudden or severe stomach pain, bloody diarrhea, fever, nausea,  vomiting Side effects that usually do not require medical attention (report to your care team if they continue or are bothersome): Bone, joint, or muscle pain Diarrhea Fatigue Loss of appetite Nausea Skin rash This list may not describe all possible side effects. Call your doctor for medical advice about side effects. You may report side effects to FDA at 1-800-FDA-1088. Where should I keep my medication? This medication is given in a hospital or clinic. It will not be stored at home. NOTE: This sheet is a summary. It may not cover all possible information. If you have questions about this medicine, talk to your doctor, pharmacist, or health care provider.  2024 Elsevier/Gold Standard (2022-02-05 00:00:00)

## 2024-06-02 NOTE — Progress Notes (Signed)
 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  This was followed by a PET CT scan on 06/11/2023 which showed a 2.8 cm right level 2 lymph node with an SUV of 31.  Peripheral nodular hypermetabolism compatible with irregular rim enhancement.  12 mm nodular component along the inferior aspect of the lesion measuring SUV 10.1.  No other evidence of distant metastatic disease.  Core biopsy showed necrosis but was not conclusive for malignancy.  She was then referred to [facility] and underwent definitive excision of the right cervical lymph node on 07/17/2023.  Pathology showed malignant neoplasm with extensive necrosis consistent with metastatic melanoma.  BRAF negative surgical margins negative.  Right level 2A, 2B, 3 and 4 lymph nodes and right tonsil as well as right retromolar trigone biopsy all negative for malignancy. She was staged as stage IIIB T0 N1b M0.  She received adjuvant radiation therapy in [facility].  Pros and cons of adjuvant immunotherapy discussed with the patient and patient chose to receive care closer to home in [place].    Interval history-tolerating treatments well so far.  Mild skin rash which fluctuates but overall self-limited.  ECOG PS- 1 Pain scale- 0   Review of systems- Review of Systems  Constitutional:  Negative for chills, fever, malaise/fatigue and weight  loss.  HENT:  Negative for congestion, ear discharge and nosebleeds.   Eyes:  Negative for blurred vision.  Respiratory:  Negative for cough, hemoptysis, sputum production, shortness of breath and wheezing.   Cardiovascular:  Negative for chest pain, palpitations, orthopnea and claudication.  Gastrointestinal:  Negative for abdominal pain, blood in stool, constipation, diarrhea, heartburn, melena, nausea and vomiting.  Genitourinary:  Negative for dysuria, flank pain, frequency, hematuria and urgency.  Musculoskeletal:  Negative for back pain, joint pain and myalgias.  Skin:  Negative for rash.  Neurological:  Negative for dizziness, tingling, focal weakness, seizures, weakness and headaches.  Endo/Heme/Allergies:  Does not bruise/bleed easily.  Psychiatric/Behavioral:  Negative for depression and suicidal ideas. The patient does not have insomnia.       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   IR IMAGING GUIDED PORT INSERTION  08/21/2023   KNEE ARTHROSCOPY W/ MENISCAL REPAIR Left  PARTIAL KNEE ARTHROPLASTY Right 09/16/2017   PARTIAL KNEE ARTHROPLASTY Right 09/16/2017   Procedure: RIGHT UNICOMPARTMENTAL KNEE;  Surgeon: Vernetta Lonni GRADE, MD;  Location: [facility] OR;  Service: Orthopedics;  Laterality: Right;   SHOULDER ARTHROSCOPY Left 11/2018   TOTAL KNEE REVISION Right 06/15/2019   Procedure: RIGHT UNICOMPARTMENTAL ARTHROPLASTY TO TOTAL KNEE ARTHROPLASTY;   Surgeon: Vernetta Lonni GRADE, MD;  Location: [facility] OR;  Service: Orthopedics;  Laterality: Right;   TUBAL LIGATION      Social History   Socioeconomic History   Marital status: Married    Spouse name: Klani Caridi   Number of children: 2   Years of education: Not on file   Highest education level: 12th grade  Occupational History   Occupation: Retired  Tobacco Use   Smoking status: Former    Current packs/day: 0.00    Types: Cigarettes    Quit date: 12/28/1986    Years since quitting: 37.4   Smokeless tobacco: Never  Vaping Use   Vaping status: Never Used  Substance and Sexual Activity   Alcohol use: No   Drug use: No   Sexual activity: Not Currently  Other Topics Concern   Not on file  Social History Narrative   Not on file   Social Drivers of Health   Financial Resource Strain: Low Risk  (03/10/2024)   Overall Financial Resource Strain (CARDIA)    Difficulty of Paying Living Expenses: Not hard at all  Food Insecurity: No Food Insecurity (05/12/2024)   Hunger Vital Sign    Worried About Running Out of Food in the Last Year: Never true    Ran Out of Food in the Last Year: Never true  Transportation Needs: No Transportation Needs (05/12/2024)   PRAPARE - Administrator, Civil Service (Medical): No    Lack of Transportation (Non-Medical): No  Physical Activity: Sufficiently Active (03/10/2024)   Exercise Vital Sign    Days of Exercise per Week: 5 days    Minutes of Exercise per Session: 30 min  Stress: No Stress Concern Present (03/10/2024)   [name]    Feeling of Stress : Not at all  Social Connections: Socially Integrated (03/10/2024)   Social Connection and Isolation Panel    Frequency of Communication with Friends and Family: More than three times a week    Frequency of Social Gatherings with Friends and Family: Once a week    Attends Religious Services: More than 4 times per year    Active  Member of [employer] or Organizations: Yes    Attends Engineer, structural: More than 4 times per year    Marital Status: Married  Catering manager Violence: Not At Risk (05/12/2024)   Humiliation, Afraid, Rape, and Kick questionnaire    Fear of Current or Ex-Partner: No    Emotionally Abused: No    Physically Abused: No    Sexually Abused: No    Family History  Problem Relation Age of Onset   Breast cancer Neg Hx      Current Outpatient Medications:    acetaminophen  (TYLENOL ) 325 MG tablet, Take by mouth., Disp: , Rfl:    aspirin  EC 81 MG tablet, Take 81 mg by mouth daily., Disp: , Rfl:    atorvastatin  (LIPITOR) 10 MG tablet, TAKE 1 TABLET BY MOUTH EVERY DAY, Disp: 90 tablet, Rfl: 1   Blood Glucose Monitoring Suppl DEVI, Use to check blood sugar twice a day.  DX R73.03. May substitute  to any manufacturer covered by patient's insurance., Disp: 1 each, Rfl: 0   Calcium  Carb-Cholecalciferol  600-800 MG-UNIT TABS, Take 1 tablet by mouth 2 (two) times daily. , Disp: , Rfl:    cetirizine (ZYRTEC) 10 MG tablet, Take 10 mg by mouth daily., Disp: , Rfl:    clotrimazole -betamethasone  (LOTRISONE ) cream, APPLY 1-2 TIMES A DAY AS NEEDED FOR SPOT TREATMENT FOR YEAST SKIN RASH, Disp: 30 g, Rfl: 1   esomeprazole  (NEXIUM ) 20 MG capsule, TAKE 1 CAPSULE (20 MG TOTAL) BY MOUTH 2 (TWO) TIMES DAILY BEFORE A MEAL., Disp: 180 capsule, Rfl: 0   fluticasone  (FLONASE ) 50 MCG/ACT nasal spray, Place 2 sprays into the nose at bedtime. , Disp: , Rfl:    Glucosamine-Chondroitin (COSAMIN DS PO), Take 1 tablet by mouth 2 (two) times daily., Disp: , Rfl:    Lancets Misc. MISC, Use to check blood sugar twice a day.  DX R73.03. May substitute to any manufacturer covered by patient's insurance., Disp: 100 each, Rfl: 0   lidocaine  (XYLOCAINE ) 2 % solution, 10 mLs., Disp: , Rfl:    lidocaine -prilocaine  (EMLA ) cream, Apply to affected area once, Disp: 30 g, Rfl: 3   Multiple Vitamins-Minerals (MULTIVITAMIN WITH MINERALS)  tablet, Take 1 tablet by mouth every evening. Women's One-A-Day, Disp: , Rfl:    nystatin  (MYCOSTATIN /NYSTOP ) powder, APPLY TO AFFECTED AREA 3 TIMES A DAY, Disp: 30 g, Rfl: 1   Omega-3 Fatty Acids (FISH OIL) 1000 MG CAPS, Take 1,000 mg by mouth daily., Disp: , Rfl:    ondansetron  (ZOFRAN ) 8 MG tablet, Take 1 tablet (8 mg total) by mouth every 8 (eight) hours as needed for nausea or vomiting., Disp: 30 tablet, Rfl: 1   ONETOUCH VERIO test strip, USE TO CHECK BLOOD SUGAR TWICE A DAY. DX R73.03., Disp: 100 strip, Rfl: 1   prochlorperazine  (COMPAZINE ) 10 MG tablet, Take 1 tablet (10 mg total) by mouth every 6 (six) hours as needed for nausea or vomiting., Disp: 30 tablet, Rfl: 1   TURMERIC PO, Take 538 mg by mouth daily., Disp: , Rfl:    pilocarpine  (SALAGEN ) 5 MG tablet, Take 1 tablet (5 mg total) by mouth 3 (three) times daily. For dry mouth from radiation (Patient not taking: Reported on 06/02/2024), Disp: 90 tablet, Rfl: 1 No current facility-administered medications for this visit.  Facility-Administered Medications Ordered in Other Visits:    0.9 %  sodium chloride  infusion, , Intravenous, Continuous, Melanee Annah BROCKS, MD, Last Rate: 10 mL/hr at 06/02/24 0919, New Bag at 06/02/24 0919   pembrolizumab  (KEYTRUDA ) 200 mg in sodium chloride  0.9 % 50 mL chemo infusion, 200 mg, Intravenous, Once, Melanee Annah BROCKS, MD, Last Rate: 116 mL/hr at 06/02/24 0920, 200 mg at 06/02/24 0920  Physical exam:  Vitals:   06/02/24 0829  BP: 138/62  Pulse: 67  Resp: 17  Temp: (!) 96.4 F (35.8 C)  TempSrc: Tympanic  SpO2: 97%  Weight: 164 lb (74.4 kg)   Physical Exam Cardiovascular:     Rate and Rhythm: Normal rate and regular rhythm.     Heart sounds: Normal heart sounds.  Pulmonary:     Effort: Pulmonary effort is normal.     Breath sounds: Normal breath sounds.  Skin:    General: Skin is warm and dry.  Neurological:     Mental Status: She is alert and oriented to person, place, and time.      I have  personally reviewed labs listed below:    Latest Ref Rng & Units 06/02/2024  8:09 AM  CMP  Glucose 70 - 99 mg/dL 892   BUN 8 - 23 mg/dL 19   Creatinine 9.55 - 1.00 mg/dL 9.32   Sodium 864 - 854 mmol/L 138   Potassium 3.5 - 5.1 mmol/L 3.9   Chloride 98 - 111 mmol/L 104   CO2 22 - 32 mmol/L 25   Calcium  8.9 - 10.3 mg/dL 9.0   Total Protein 6.5 - 8.1 g/dL 6.8   Total Bilirubin 0.0 - 1.2 mg/dL 0.8   Alkaline Phos 38 - 126 U/L 54   AST 15 - 41 U/L 25   ALT 0 - 44 U/L 18       Latest Ref Rng & Units 06/02/2024    8:09 AM  CBC  WBC 4.0 - 10.5 K/uL 5.5   Hemoglobin 12.0 - 15.0 g/dL 86.6   Hematocrit 63.9 - 46.0 % 40.0   Platelets 150 - 400 K/uL 170      Assessment and plan- Patient is a 83 y.o. 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  She is here for on treatment assessment prior to cycle 13 of adjuvant Keytruda   Counts okay to proceed with cycle 3 of adjuvant Keytruda  today.  I will see her back in 3 weeks for cycle 14.  She completes 1 year of adjuvant treatment in November 2025.  PET scan is already scheduled for next month.   Visit Diagnosis 1. Melanoma of skin (HCC)   2. Encounter for antineoplastic immunotherapy      Dr. Annah Skene, MD, MPH [facility] [phone] 06/02/2024 9:26 AM

## 2024-06-02 NOTE — Progress Notes (Signed)
 Patient here for oncology follow-up appointment, expresses no complaints or concerns at this time.

## 2024-06-06 ENCOUNTER — Other Ambulatory Visit: Payer: Self-pay | Admitting: Internal Medicine

## 2024-06-08 NOTE — Telephone Encounter (Signed)
.   Requested Prescriptions  Pending Prescriptions Disp Refills   esomeprazole  (NEXIUM ) 20 MG capsule [Pharmacy Med Name: ESOMEPRAZOLE  MAG DR 20 MG CAP] 180 capsule 1    Sig: TAKE 1 CAPSULE (20 MG TOTAL) BY MOUTH 2 (TWO) TIMES DAILY BEFORE A MEAL.     Gastroenterology: Proton Pump Inhibitors 2 Passed - 06/08/2024 12:30 PM      Passed - ALT in normal range and within 360 days    ALT  Date Value Ref Range Status  06/02/2024 18 0 - 44 U/L Final         Passed - AST in normal range and within 360 days    AST  Date Value Ref Range Status  06/02/2024 25 15 - 41 U/L Final         Passed - Valid encounter within last 12 months    Recent Outpatient Visits           2 months ago Laceration of left index finger without foreign body without damage to nail, subsequent encounter   [facility] [place], [place], NP   2 months ago Class 1 obesity due to excess calories with serious comorbidity and body mass index (BMI) of 30.0 to 30.9 in adult   [facility] [place], Angeline ORN, NP   7 years ago Annual physical exam   [facility], Elspeth LABOR, MD   8 years ago Cerumen impaction, bilateral   [facility], [place], MD   8 years ago Cough   [facility] [place], MD

## 2024-06-10 DIAGNOSIS — N3281 Overactive bladder: Secondary | ICD-10-CM | POA: Diagnosis not present

## 2024-06-23 ENCOUNTER — Encounter: Payer: Self-pay | Admitting: Oncology

## 2024-06-23 ENCOUNTER — Inpatient Hospital Stay (HOSPITAL_BASED_OUTPATIENT_CLINIC_OR_DEPARTMENT_OTHER): Admitting: Oncology

## 2024-06-23 ENCOUNTER — Inpatient Hospital Stay: Attending: Oncology

## 2024-06-23 ENCOUNTER — Ambulatory Visit
Admission: RE | Admit: 2024-06-23 | Discharge: 2024-06-23 | Disposition: A | Discharge status: Home / Self Care | Source: Ambulatory Visit | Attending: Oncology | Admitting: Oncology

## 2024-06-23 ENCOUNTER — Inpatient Hospital Stay

## 2024-06-23 VITALS — BP 141/60 | HR 81 | Temp 97.0°F | Resp 16 | Wt 164.9 lb

## 2024-06-23 DIAGNOSIS — C439 Malignant melanoma of skin, unspecified: Secondary | ICD-10-CM | POA: Diagnosis not present

## 2024-06-23 DIAGNOSIS — I700 Atherosclerosis of aorta: Secondary | ICD-10-CM | POA: Insufficient documentation

## 2024-06-23 DIAGNOSIS — Z79899 Other long term (current) drug therapy: Secondary | ICD-10-CM | POA: Diagnosis not present

## 2024-06-23 DIAGNOSIS — Z5112 Encounter for antineoplastic immunotherapy: Secondary | ICD-10-CM | POA: Insufficient documentation

## 2024-06-23 DIAGNOSIS — I517 Cardiomegaly: Secondary | ICD-10-CM | POA: Insufficient documentation

## 2024-06-23 DIAGNOSIS — C770 Secondary and unspecified malignant neoplasm of lymph nodes of head, face and neck: Secondary | ICD-10-CM | POA: Diagnosis present

## 2024-06-23 DIAGNOSIS — I2510 Atherosclerotic heart disease of native coronary artery without angina pectoris: Secondary | ICD-10-CM | POA: Diagnosis not present

## 2024-06-23 DIAGNOSIS — C9000 Multiple myeloma not having achieved remission: Secondary | ICD-10-CM | POA: Insufficient documentation

## 2024-06-23 LAB — CMP (CANCER CENTER ONLY)
ALT: 18 U/L (ref 0–44)
AST: 27 U/L (ref 15–41)
Albumin: 3.8 g/dL (ref 3.5–5.0)
Alkaline Phosphatase: 58 U/L (ref 38–126)
Anion gap: 9 (ref 5–15)
BUN: 17 mg/dL (ref 8–23)
CO2: 23 mmol/L (ref 22–32)
Calcium: 8.9 mg/dL (ref 8.9–10.3)
Chloride: 103 mmol/L (ref 98–111)
Creatinine: 0.76 mg/dL (ref 0.44–1.00)
GFR, Estimated: >60 mL/min (ref >60)
Glucose, Bld: 154 mg/dL — ABNORMAL HIGH (ref 70–99)
Potassium: 3.8 mmol/L (ref 3.5–5.1)
Sodium: 135 mmol/L (ref 135–145)
Total Bilirubin: 0.8 mg/dL (ref 0.0–1.2)
Total Protein: 6.9 g/dL (ref 6.5–8.1)

## 2024-06-23 LAB — CBC WITH DIFFERENTIAL (CANCER CENTER ONLY)
Abs Immature Granulocytes: 0.01 K/uL (ref 0.00–0.07)
Basophils Absolute: 0 K/uL (ref 0.0–0.1)
Basophils Relative: 1 %
Eosinophils Absolute: 0.1 K/uL (ref 0.0–0.5)
Eosinophils Relative: 2 %
HCT: 40.5 % (ref 36.0–46.0)
Hemoglobin: 13.6 g/dL (ref 12.0–15.0)
Immature Granulocytes: 0 %
Lymphocytes Relative: 36 %
Lymphs Abs: 2.1 K/uL (ref 0.7–4.0)
MCH: 30.5 pg (ref 26.0–34.0)
MCHC: 33.6 g/dL (ref 30.0–36.0)
MCV: 90.8 fL (ref 80.0–100.0)
Monocytes Absolute: 0.5 K/uL (ref 0.1–1.0)
Monocytes Relative: 8 %
Neutro Abs: 3.2 K/uL (ref 1.7–7.7)
Neutrophils Relative %: 53 %
Platelet Count: 169 K/uL (ref 150–400)
RBC: 4.46 MIL/uL (ref 3.87–5.11)
RDW: 12.2 % (ref 11.5–15.5)
WBC Count: 5.8 K/uL (ref 4.0–10.5)
nRBC: 0 % (ref 0.0–0.2)

## 2024-06-23 LAB — GLUCOSE, CAPILLARY: Glucose-Capillary: 118 mg/dL — ABNORMAL HIGH (ref 70–99)

## 2024-06-23 LAB — TSH: TSH: 1.688 u[IU]/mL (ref 0.350–4.500)

## 2024-06-23 MED ORDER — SODIUM CHLORIDE 0.9 % IV SOLN
INTRAVENOUS | Status: DC
  Filled 2024-06-23: qty 250

## 2024-06-23 MED ORDER — SODIUM CHLORIDE 0.9 % IV SOLN
200.0000 mg | Freq: Once | INTRAVENOUS | Status: AC
  Administered 2024-06-23: 200 mg via INTRAVENOUS
  Filled 2024-06-23: qty 8

## 2024-06-23 MED ORDER — FLUDEOXYGLUCOSE F - 18 (FDG) INJECTION
9.0200 | Freq: Once | INTRAVENOUS | Status: AC | PRN
  Administered 2024-06-23: 9.02 via INTRAVENOUS

## 2024-06-23 NOTE — Progress Notes (Signed)
 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  This was followed by a PET CT scan on 06/11/2023 which showed a 2.8 cm right level 2 lymph node with an SUV of 31.  Peripheral nodular hypermetabolism compatible with irregular rim enhancement.  12 mm nodular component along the inferior aspect of the lesion measuring SUV 10.1.  No other evidence of distant metastatic disease.  Core biopsy showed necrosis but was not conclusive for malignancy.  She was then referred to [facility] and underwent definitive excision of the right cervical lymph node on 07/17/2023.  Pathology showed malignant neoplasm with extensive necrosis consistent with metastatic melanoma.  BRAF negative surgical margins negative.  Right level 2A, 2B, 3 and 4 lymph nodes and right tonsil as well as right retromolar trigone biopsy all negative for malignancy. She was staged as stage IIIB T0 N1b M0.  She received adjuvant radiation therapy in [facility].  Pros and cons of adjuvant immunotherapy discussed with the patient and patient chose to receive care closer to home in [place].    Interval history-she is tolerating Keytruda  well and reports no complaints at this time  ECOG PS- 1 Pain scale- 0   Review of systems- Review of Systems  Constitutional:  Positive for malaise/fatigue. Negative for chills, fever and weight loss.   HENT:  Negative for congestion, ear discharge and nosebleeds.   Eyes:  Negative for blurred vision.  Respiratory:  Negative for cough, hemoptysis, sputum production, shortness of breath and wheezing.   Cardiovascular:  Negative for chest pain, palpitations, orthopnea and claudication.  Gastrointestinal:  Negative for abdominal pain, blood in stool, constipation, diarrhea, heartburn, melena, nausea and vomiting.  Genitourinary:  Negative for dysuria, flank pain, frequency, hematuria and urgency.  Musculoskeletal:  Negative for back pain, joint pain and myalgias.  Skin:  Negative for rash.  Neurological:  Negative for dizziness, tingling, focal weakness, seizures, weakness and headaches.  Endo/Heme/Allergies:  Does not bruise/bleed easily.  Psychiatric/Behavioral:  Negative for depression and suicidal ideas. The patient does not have insomnia.       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   IR IMAGING GUIDED PORT INSERTION  08/21/2023   KNEE ARTHROSCOPY W/ MENISCAL REPAIR Left  PARTIAL KNEE ARTHROPLASTY Right 09/16/2017   PARTIAL KNEE ARTHROPLASTY Right 09/16/2017   Procedure: RIGHT UNICOMPARTMENTAL KNEE;  Surgeon: Vernetta Lonni GRADE, MD;  Location: [facility] OR;  Service: Orthopedics;  Laterality: Right;   SHOULDER ARTHROSCOPY Left 11/2018   TOTAL KNEE REVISION Right 06/15/2019   Procedure: RIGHT UNICOMPARTMENTAL ARTHROPLASTY TO TOTAL KNEE ARTHROPLASTY;   Surgeon: Vernetta Lonni GRADE, MD;  Location: [facility] OR;  Service: Orthopedics;  Laterality: Right;   TUBAL LIGATION      Social History   Socioeconomic History   Marital status: Married    Spouse name: [name]   Number of children: 2   Years of education: Not on file   Highest education level: 12th grade  Occupational History   Occupation: Retired  Tobacco Use   Smoking status: Former    Current packs/day: 0.00    Types: Cigarettes    Quit date: 12/28/1986    Years since quitting: 37.5   Smokeless tobacco: Never  Vaping Use   Vaping status: Never Used  Substance and Sexual Activity   Alcohol use: No   Drug use: No   Sexual activity: Not Currently  Other Topics Concern   Not on file  Social History Narrative   Not on file   Social Drivers of Health   Financial Resource Strain: Low Risk  (03/10/2024)   Overall Financial Resource Strain (CARDIA)    Difficulty of Paying Living Expenses: Not hard at all  Food Insecurity: No Food Insecurity (05/12/2024)   Hunger Vital Sign    Worried About Running Out of Food in the Last Year: Never true    Ran Out of Food in the Last Year: Never true  Transportation Needs: No Transportation Needs (05/12/2024)   PRAPARE - Administrator, Civil Service (Medical): No    Lack of Transportation (Non-Medical): No  Physical Activity: Sufficiently Active (03/10/2024)   Exercise Vital Sign    Days of Exercise per Week: 5 days    Minutes of Exercise per Session: 30 min  Stress: No Stress Concern Present (03/10/2024)   [name]    Feeling of Stress : Not at all  Social Connections: Socially Integrated (03/10/2024)   Social Connection and Isolation Panel    Frequency of Communication with Friends and Family: More than three times a week    Frequency of Social Gatherings with Friends and Family: Once a week    Attends Religious Services: More than 4 times per year    Active  Member of [employer] or Organizations: Yes    Attends Engineer, structural: More than 4 times per year    Marital Status: Married  Catering manager Violence: Not At Risk (05/12/2024)   Humiliation, Afraid, Rape, and Kick questionnaire    Fear of Current or Ex-Partner: No    Emotionally Abused: No    Physically Abused: No    Sexually Abused: No    Family History  Problem Relation Age of Onset   Breast cancer Neg Hx      Current Outpatient Medications:    acetaminophen  (TYLENOL ) 325 MG tablet, Take by mouth., Disp: , Rfl:    aspirin  EC 81 MG tablet, Take 81 mg by mouth daily., Disp: , Rfl:    atorvastatin  (LIPITOR) 10 MG tablet, TAKE 1 TABLET BY MOUTH EVERY DAY, Disp: 90 tablet, Rfl: 1   Blood Glucose Monitoring Suppl DEVI, Use to check blood sugar twice a day.  DX R73.03. May substitute  to any manufacturer covered by patient's insurance., Disp: 1 each, Rfl: 0   Calcium  Carb-Cholecalciferol  600-800 MG-UNIT TABS, Take 1 tablet by mouth 2 (two) times daily. , Disp: , Rfl:    cetirizine (ZYRTEC) 10 MG tablet, Take 10 mg by mouth daily., Disp: , Rfl:    clotrimazole -betamethasone  (LOTRISONE ) cream, APPLY 1-2 TIMES A DAY AS NEEDED FOR SPOT TREATMENT FOR YEAST SKIN RASH, Disp: 30 g, Rfl: 1   esomeprazole  (NEXIUM ) 20 MG capsule, TAKE 1 CAPSULE (20 MG TOTAL) BY MOUTH 2 (TWO) TIMES DAILY BEFORE A MEAL., Disp: 180 capsule, Rfl: 1   fluticasone  (FLONASE ) 50 MCG/ACT nasal spray, Place 2 sprays into the nose at bedtime. , Disp: , Rfl:    Glucosamine-Chondroitin (COSAMIN DS PO), Take 1 tablet by mouth 2 (two) times daily., Disp: , Rfl:    Lancets Misc. MISC, Use to check blood sugar twice a day.  DX R73.03. May substitute to any manufacturer covered by patient's insurance., Disp: 100 each, Rfl: 0   lidocaine  (XYLOCAINE ) 2 % solution, 10 mLs., Disp: , Rfl:    lidocaine -prilocaine  (EMLA ) cream, Apply to affected area once, Disp: 30 g, Rfl: 3   Multiple Vitamins-Minerals (MULTIVITAMIN WITH MINERALS)  tablet, Take 1 tablet by mouth every evening. Women's One-A-Day, Disp: , Rfl:    nystatin  (MYCOSTATIN /NYSTOP ) powder, APPLY TO AFFECTED AREA 3 TIMES A DAY, Disp: 30 g, Rfl: 1   Omega-3 Fatty Acids (FISH OIL) 1000 MG CAPS, Take 1,000 mg by mouth daily., Disp: , Rfl:    ondansetron  (ZOFRAN ) 8 MG tablet, Take 1 tablet (8 mg total) by mouth every 8 (eight) hours as needed for nausea or vomiting., Disp: 30 tablet, Rfl: 1   ONETOUCH VERIO test strip, USE TO CHECK BLOOD SUGAR TWICE A DAY. DX R73.03., Disp: 100 strip, Rfl: 1   prochlorperazine  (COMPAZINE ) 10 MG tablet, Take 1 tablet (10 mg total) by mouth every 6 (six) hours as needed for nausea or vomiting., Disp: 30 tablet, Rfl: 1   TURMERIC PO, Take 538 mg by mouth daily., Disp: , Rfl:    pilocarpine  (SALAGEN ) 5 MG tablet, Take 1 tablet (5 mg total) by mouth 3 (three) times daily. For dry mouth from radiation (Patient not taking: Reported on 06/23/2024), Disp: 90 tablet, Rfl: 1 No current facility-administered medications for this visit.  Facility-Administered Medications Ordered in Other Visits:    0.9 %  sodium chloride  infusion, , Intravenous, Continuous, Melanee Annah BROCKS, MD   pembrolizumab  (KEYTRUDA ) 200 mg in sodium chloride  0.9 % 50 mL chemo infusion, 200 mg, Intravenous, Once, Melanee Annah BROCKS, MD  Physical exam:  Vitals:   06/23/24 1423  BP: (!) 141/60  Pulse: 81  Resp: 16  Temp: (!) 97 F (36.1 C)  TempSrc: Tympanic  SpO2: 97%  Weight: 164 lb 14.4 oz (74.8 kg)   Physical Exam Cardiovascular:     Rate and Rhythm: Normal rate and regular rhythm.     Heart sounds: Normal heart sounds.  Pulmonary:     Effort: Pulmonary effort is normal.     Breath sounds: Normal breath sounds.  Skin:    General: Skin is warm and dry.  Neurological:     Mental Status: She is alert and oriented to person, place, and time.      I have personally reviewed labs listed below:    Latest Ref Rng & Units 06/23/2024    1:49 PM  CMP  Glucose 70 - 99  mg/dL 845   BUN 8 - 23 mg/dL  17   Creatinine 0.44 - 1.00 mg/dL 9.23   Sodium 864 - 854 mmol/L 135   Potassium 3.5 - 5.1 mmol/L 3.8   Chloride 98 - 111 mmol/L 103   CO2 22 - 32 mmol/L 23   Calcium  8.9 - 10.3 mg/dL 8.9   Total Protein 6.5 - 8.1 g/dL 6.9   Total Bilirubin 0.0 - 1.2 mg/dL 0.8   Alkaline Phos 38 - 126 U/L 58   AST 15 - 41 U/L 27   ALT 0 - 44 U/L 18       Latest Ref Rng & Units 06/23/2024    1:49 PM  CBC  WBC 4.0 - 10.5 K/uL 5.8   Hemoglobin 12.0 - 15.0 g/dL 86.3   Hematocrit 63.9 - 46.0 % 40.5   Platelets 150 - 400 K/uL 169     Assessment and plan- Patient is a 83 y.o. 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  She is here for on treatment assessment prior to cycle 14 of Keytruda   Counts okay to proceed with cycle 14 of Keytruda  today.  I will see her back in 3 weeks for cycle 15.  I have reviewed PET CT scan images independently and discussed findings with the patient which does not show any evidence of recurrent disease.  I am awaiting official PET scan reporting.  She will complete 1 year of adjuvant treatment in November 2025   Visit Diagnosis 1. Melanoma of skin (HCC)   2. Encounter for antineoplastic immunotherapy      Dr. Annah Skene, MD, MPH [facility] at [facility] [phone] 06/23/2024 2:54 PM

## 2024-06-23 NOTE — Progress Notes (Signed)
Pt in for follow up denies any concerns today.  

## 2024-06-24 ENCOUNTER — Other Ambulatory Visit: Payer: Self-pay

## 2024-06-24 LAB — T4: T4, Total: 6.5 ug/dL (ref 4.5–12.0)

## 2024-07-08 ENCOUNTER — Encounter: Payer: Self-pay | Admitting: Oncology

## 2024-07-14 ENCOUNTER — Inpatient Hospital Stay (HOSPITAL_BASED_OUTPATIENT_CLINIC_OR_DEPARTMENT_OTHER): Admitting: Oncology

## 2024-07-14 ENCOUNTER — Inpatient Hospital Stay: Attending: Oncology

## 2024-07-14 ENCOUNTER — Encounter: Payer: Self-pay | Admitting: Oncology

## 2024-07-14 ENCOUNTER — Inpatient Hospital Stay

## 2024-07-14 VITALS — BP 139/56 | HR 69 | Temp 96.1°F | Resp 19 | Ht 61.0 in | Wt 166.9 lb

## 2024-07-14 VITALS — BP 157/59 | HR 66

## 2024-07-14 DIAGNOSIS — Z79899 Other long term (current) drug therapy: Secondary | ICD-10-CM | POA: Insufficient documentation

## 2024-07-14 DIAGNOSIS — C439 Malignant melanoma of skin, unspecified: Secondary | ICD-10-CM

## 2024-07-14 DIAGNOSIS — Z5112 Encounter for antineoplastic immunotherapy: Secondary | ICD-10-CM

## 2024-07-14 LAB — CBC WITH DIFFERENTIAL (CANCER CENTER ONLY)
Abs Immature Granulocytes: 0.02 K/uL (ref 0.00–0.07)
Basophils Absolute: 0 K/uL (ref 0.0–0.1)
Basophils Relative: 0 %
Eosinophils Absolute: 0.2 K/uL (ref 0.0–0.5)
Eosinophils Relative: 3 %
HCT: 40 % (ref 36.0–46.0)
Hemoglobin: 13.2 g/dL (ref 12.0–15.0)
Immature Granulocytes: 0 %
Lymphocytes Relative: 37 %
Lymphs Abs: 2.1 K/uL (ref 0.7–4.0)
MCH: 29.9 pg (ref 26.0–34.0)
MCHC: 33 g/dL (ref 30.0–36.0)
MCV: 90.7 fL (ref 80.0–100.0)
Monocytes Absolute: 0.5 K/uL (ref 0.1–1.0)
Monocytes Relative: 8 %
Neutro Abs: 2.8 K/uL (ref 1.7–7.7)
Neutrophils Relative %: 52 %
Platelet Count: 162 K/uL (ref 150–400)
RBC: 4.41 MIL/uL (ref 3.87–5.11)
RDW: 12.2 % (ref 11.5–15.5)
WBC Count: 5.6 K/uL (ref 4.0–10.5)
nRBC: 0 % (ref 0.0–0.2)

## 2024-07-14 LAB — CMP (CANCER CENTER ONLY)
ALT: 18 U/L (ref 0–44)
AST: 27 U/L (ref 15–41)
Albumin: 4 g/dL (ref 3.5–5.0)
Alkaline Phosphatase: 51 U/L (ref 38–126)
Anion gap: 10 (ref 5–15)
BUN: 20 mg/dL (ref 8–23)
CO2: 24 mmol/L (ref 22–32)
Calcium: 8.7 mg/dL — ABNORMAL LOW (ref 8.9–10.3)
Chloride: 103 mmol/L (ref 98–111)
Creatinine: 0.8 mg/dL (ref 0.44–1.00)
GFR, Estimated: >60 mL/min (ref >60)
Glucose, Bld: 112 mg/dL — ABNORMAL HIGH (ref 70–99)
Potassium: 3.9 mmol/L (ref 3.5–5.1)
Sodium: 137 mmol/L (ref 135–145)
Total Bilirubin: 0.6 mg/dL (ref 0.0–1.2)
Total Protein: 6.9 g/dL (ref 6.5–8.1)

## 2024-07-14 MED ORDER — SODIUM CHLORIDE 0.9 % IV SOLN
200.0000 mg | Freq: Once | INTRAVENOUS | Status: AC
  Administered 2024-07-14: 200 mg via INTRAVENOUS
  Filled 2024-07-14: qty 8

## 2024-07-14 MED ORDER — SODIUM CHLORIDE 0.9 % IV SOLN
INTRAVENOUS | Status: DC
  Filled 2024-07-14: qty 250

## 2024-07-14 NOTE — Progress Notes (Signed)
 Patient states she's doing pretty well and that she does not have new or acute concerns at this time.

## 2024-07-14 NOTE — Patient Instructions (Signed)

## 2024-07-14 NOTE — Progress Notes (Signed)
 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  This was followed by a PET CT scan on 06/11/2023 which showed a 2.8 cm right level 2 lymph node with an SUV of 31.  Peripheral nodular hypermetabolism compatible with irregular rim enhancement.  12 mm nodular component along the inferior aspect of the lesion measuring SUV 10.1.  No other evidence of distant metastatic disease.  Core biopsy showed necrosis but was not conclusive for malignancy.  She was then referred to [facility] and underwent definitive excision of the right cervical lymph node on 07/17/2023.  Pathology showed malignant neoplasm with extensive necrosis consistent with metastatic melanoma.  BRAF negative surgical margins negative.  Right level 2A, 2B, 3 and 4 lymph nodes and right tonsil as well as right retromolar trigone biopsy all negative for malignancy. She was staged as stage IIIB T0 N1b M0.  She received adjuvant radiation therapy in [facility].  Pros and cons of adjuvant immunotherapy discussed with the patient and patient chose to receive care closer to home in [place].      Interval history- She feels good overall, with no current health concerns. Her previous skin rash no longer bothers her, though she still has some scars. No itching, nausea, vomiting, or diarrhea.  ECOG PS- 0 Pain scale- 0   Review of systems-  Review of Systems  Constitutional:  Negative for chills, fever, malaise/fatigue and weight loss.  HENT:  Negative for congestion, ear discharge and nosebleeds.   Eyes:  Negative for blurred vision.  Respiratory:  Negative for cough, hemoptysis, sputum production, shortness of breath and wheezing.   Cardiovascular:  Negative for chest pain, palpitations, orthopnea and claudication.  Gastrointestinal:  Negative for abdominal pain, blood in stool, constipation, diarrhea, heartburn, melena, nausea and vomiting.  Genitourinary:  Negative for dysuria, flank pain, frequency, hematuria and urgency.  Musculoskeletal:  Negative for back pain, joint pain and myalgias.  Skin:  Negative for rash.  Neurological:  Negative for dizziness, tingling, focal weakness, seizures, weakness and headaches.  Endo/Heme/Allergies:  Does not bruise/bleed easily.  Psychiatric/Behavioral:  Negative for depression and suicidal ideas. The patient does not have insomnia.       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  IR IMAGING GUIDED PORT INSERTION  08/21/2023   KNEE ARTHROSCOPY W/ MENISCAL REPAIR Left    PARTIAL KNEE ARTHROPLASTY Right 09/16/2017   PARTIAL KNEE ARTHROPLASTY Right 09/16/2017   Procedure: RIGHT UNICOMPARTMENTAL KNEE;  Surgeon: Vernetta Lonni GRADE, MD;  Location: [facility] OR;  Service: Orthopedics;  Laterality: Right;   SHOULDER ARTHROSCOPY Left 11/2018   TOTAL KNEE REVISION  Right 06/15/2019   Procedure: RIGHT UNICOMPARTMENTAL ARTHROPLASTY TO TOTAL KNEE ARTHROPLASTY;  Surgeon: Vernetta Lonni GRADE, MD;  Location: [facility] OR;  Service: Orthopedics;  Laterality: Right;   TUBAL LIGATION      Social History   Socioeconomic History   Marital status: Married    Spouse name: Layaan Mott   Number of children: 2   Years of education: Not on file   Highest education level: 12th grade  Occupational History   Occupation: Retired  Tobacco Use   Smoking status: Former    Current packs/day: 0.00    Types: Cigarettes    Quit date: 12/28/1986    Years since quitting: 37.5   Smokeless tobacco: Never  Vaping Use   Vaping status: Never Used  Substance and Sexual Activity   Alcohol use: No   Drug use: No   Sexual activity: Not Currently  Other Topics Concern   Not on file  Social History Narrative   Not on file   Social Drivers of Health   Financial Resource Strain: Low Risk  (03/10/2024)   Overall Financial Resource Strain (CARDIA)    Difficulty of Paying Living Expenses: Not hard at all  Food Insecurity: No Food Insecurity (05/12/2024)   Hunger Vital Sign    Worried About Running Out of Food in the Last Year: Never true    Ran Out of Food in the Last Year: Never true  Transportation Needs: No Transportation Needs (05/12/2024)   PRAPARE - Administrator, Civil Service (Medical): No    Lack of Transportation (Non-Medical): No  Physical Activity: Sufficiently Active (03/10/2024)   Exercise Vital Sign    Days of Exercise per Week: 5 days    Minutes of Exercise per Session: 30 min  Stress: No Stress Concern Present (03/10/2024)   [name]    Feeling of Stress : Not at all  Social Connections: Socially Integrated (03/10/2024)   Social Connection and Isolation Panel    Frequency of Communication with Friends and Family: More than three times a week    Frequency of Social Gatherings with Friends  and Family: Once a week    Attends Religious Services: More than 4 times per year    Active Member of [employer] or Organizations: Yes    Attends Engineer, structural: More than 4 times per year    Marital Status: Married  Catering manager Violence: Not At Risk (05/12/2024)   Humiliation, Afraid, Rape, and Kick questionnaire    Fear of Current or Ex-Partner: No    Emotionally Abused: No    Physically Abused: No    Sexually Abused: No    Family History  Problem Relation Age of Onset   Breast cancer Neg Hx      Current Outpatient Medications:    acetaminophen  (TYLENOL ) 325 MG tablet, Take by mouth., Disp: , Rfl:    aspirin  EC 81 MG tablet, Take 81 mg by mouth daily., Disp: , Rfl:    atorvastatin  (LIPITOR) 10 MG tablet, TAKE 1 TABLET BY MOUTH EVERY DAY, Disp: 90 tablet, Rfl: 1  Blood Glucose Monitoring Suppl DEVI, Use to check blood sugar twice a day.  DX R73.03. May substitute to any manufacturer covered by patient's insurance., Disp: 1 each, Rfl: 0   Calcium  Carb-Cholecalciferol  600-800 MG-UNIT TABS, Take 1 tablet by mouth 2 (two) times daily. , Disp: , Rfl:    cetirizine (ZYRTEC) 10 MG tablet, Take 10 mg by mouth daily., Disp: , Rfl:    clotrimazole -betamethasone  (LOTRISONE ) cream, APPLY 1-2 TIMES A DAY AS NEEDED FOR SPOT TREATMENT FOR YEAST SKIN RASH, Disp: 30 g, Rfl: 1   esomeprazole  (NEXIUM ) 20 MG capsule, TAKE 1 CAPSULE (20 MG TOTAL) BY MOUTH 2 (TWO) TIMES DAILY BEFORE A MEAL., Disp: 180 capsule, Rfl: 1   fluticasone  (FLONASE ) 50 MCG/ACT nasal spray, Place 2 sprays into the nose at bedtime. , Disp: , Rfl:    Glucosamine-Chondroitin (COSAMIN DS PO), Take 1 tablet by mouth 2 (two) times daily., Disp: , Rfl:    Lancets Misc. MISC, Use to check blood sugar twice a day.  DX R73.03. May substitute to any manufacturer covered by patient's insurance., Disp: 100 each, Rfl: 0   lidocaine  (XYLOCAINE ) 2 % solution, 10 mLs., Disp: , Rfl:    lidocaine -prilocaine  (EMLA ) cream, Apply to  affected area once, Disp: 30 g, Rfl: 3   Multiple Vitamins-Minerals (MULTIVITAMIN WITH MINERALS) tablet, Take 1 tablet by mouth every evening. Women's One-A-Day, Disp: , Rfl:    nystatin  (MYCOSTATIN /NYSTOP ) powder, APPLY TO AFFECTED AREA 3 TIMES A DAY, Disp: 30 g, Rfl: 1   Omega-3 Fatty Acids (FISH OIL) 1000 MG CAPS, Take 1,000 mg by mouth daily., Disp: , Rfl:    ondansetron  (ZOFRAN ) 8 MG tablet, Take 1 tablet (8 mg total) by mouth every 8 (eight) hours as needed for nausea or vomiting., Disp: 30 tablet, Rfl: 1   ONETOUCH VERIO test strip, USE TO CHECK BLOOD SUGAR TWICE A DAY. DX R73.03., Disp: 100 strip, Rfl: 1   prochlorperazine  (COMPAZINE ) 10 MG tablet, Take 1 tablet (10 mg total) by mouth every 6 (six) hours as needed for nausea or vomiting., Disp: 30 tablet, Rfl: 1   TURMERIC PO, Take 538 mg by mouth daily., Disp: , Rfl:    pilocarpine  (SALAGEN ) 5 MG tablet, Take 1 tablet (5 mg total) by mouth 3 (three) times daily. For dry mouth from radiation (Patient not taking: Reported on 06/23/2024), Disp: 90 tablet, Rfl: 1  Physical exam:  Vitals:   07/14/24 0830  BP: (!) 139/56  Pulse: 69  Resp: 19  Temp: (!) 96.1 F (35.6 C)  TempSrc: Tympanic  SpO2: 97%  Weight: 166 lb 14.4 oz (75.7 kg)  Height: 5' 1 (1.549 m)   Physical Exam Cardiovascular:     Rate and Rhythm: Normal rate and regular rhythm.     Heart sounds: Normal heart sounds.  Pulmonary:     Effort: Pulmonary effort is normal.     Breath sounds: Normal breath sounds.  Skin:    General: Skin is warm and dry.  Neurological:     Mental Status: She is alert and oriented to person, place, and time.      I have personally reviewed labs listed below:    Latest Ref Rng & Units 07/14/2024    7:52 AM  CMP  Glucose 70 - 99 mg/dL 887   BUN 8 - 23 mg/dL 20   Creatinine 9.55 - 1.00 mg/dL 9.19   Sodium 864 - 854 mmol/L 137   Potassium 3.5 - 5.1 mmol/L 3.9   Chloride 98 -  111 mmol/L 103   CO2 22 - 32 mmol/L 24   Calcium  8.9 - 10.3  mg/dL 8.7   Total Protein 6.5 - 8.1 g/dL 6.9   Total Bilirubin 0.0 - 1.2 mg/dL 0.6   Alkaline Phos 38 - 126 U/L 51   AST 15 - 41 U/L 27   ALT 0 - 44 U/L 18       Latest Ref Rng & Units 07/14/2024    7:52 AM  CBC  WBC 4.0 - 10.5 K/uL 5.6   Hemoglobin 12.0 - 15.0 g/dL 86.7   Hematocrit 63.9 - 46.0 % 40.0   Platelets 150 - 400 K/uL 162    I have personally reviewed Radiology images listed below: No images are attached to the encounter.  NM PET Image Initial (PI) Whole Body (F-18 FDG) Result Date: 06/28/2024 EXAM: PET CT WHOLE BODY 06/23/2024 11:53:16 AM TECHNIQUE: RADIOPHARMACEUTICAL: 9.02 mCi F-18 FDG Uptake time 60 minutes. Glucose level 118 mg/dl. PET imaging was acquired from the skull vertex through the feet. Non-contrast enhanced computed tomography was obtained for attenuation correction and anatomic localization. COMPARISON: 01/02/2024 CLINICAL HISTORY: Multiple myeloma. Melanoma of skin. Currently receiving immunotherapy treatments. Last radiation treatment 12/24. FINDINGS: HEAD AND NECK: Persistent asymmetric increased uptake within the left tonsillar region has an SUV max of 6.4, axial image 39. Previously 6.5. Small right level 2 lymph node measures 5 mm with SUV max of 4.4, axial image 40. Not seen on the previous exam. CHEST: No tracer avid pulmonary nodules identified. No tracer avid lymph nodes within the chest. Cardiac enlargement, aortic atherosclerosis and coronary artery calcifications. ABDOMEN AND PELVIS: No metabolically active intraperitoneal mass. No metabolically active lymphadenopathy. Physiologic activity within the gastrointestinal and genitourinary systems. Abdominal aortic atherosclerotic calcifications. Left kidney cyst measures 2 cm. No follow up imaging recommended. Sigmoid diverticulosis without signs of acute diverticulitis. BONES AND SOFT TISSUE: No abnormal FDG activity localizes to the bones. No metabolically active aggressive osseous lesion. No tracer avid  lesions of multiple myeloma identified. IMPRESSION: 1. No evidence of metabolically active multiple myeloma. 2. Small right level 2 cervical lymph node with SUV max of 4.4, not seen on the previous exam. Nonspecific but likely physiologic reactive change. . 3. Persistent asymmetric increased uptake within the left tonsillar region with SUV max of 6.4, previously 6.5. nonspecific. Consider correlation with direct visualization. 4. Cardiac enlargement. 5. Aortic atherosclerosis and coronary artery calcifications. Electronically signed by: Waddell Calk MD 06/28/2024 07:03 AM EDT RP Workstation: [id number]     Assessment and plan- Patient is a 83 y.o. 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  She is here for on treatment assessment prior to cycle 16 of adjuvant Keytruda   Malignant melanoma of skin, status post immunotherapy Sonny is nearing completion of her one-year Keytruda  treatment. Last PET scan showed no recurrent disease, with a non-concerning lymph node noted. -Counts okay to proceed with cycle 16 of adjuvant Keytruda  today - Scheduled two more cycles, final treatment around November 19th. - Plan periodic scans every six months post-treatment. - Schedule PET scan in March to monitor lymph node. - Arrange port removal post-final treatment.   Visit Diagnosis 1. Encounter for antineoplastic immunotherapy   2. Melanoma of skin (HCC)      Dr. Annah Skene, MD, MPH [facility] [phone] 07/14/2024 8:34 AM

## 2024-07-28 ENCOUNTER — Encounter: Payer: Self-pay | Admitting: Oncology

## 2024-07-28 ENCOUNTER — Other Ambulatory Visit: Payer: Self-pay | Admitting: Oncology

## 2024-08-04 ENCOUNTER — Inpatient Hospital Stay (HOSPITAL_BASED_OUTPATIENT_CLINIC_OR_DEPARTMENT_OTHER): Admitting: Oncology

## 2024-08-04 ENCOUNTER — Inpatient Hospital Stay

## 2024-08-04 ENCOUNTER — Encounter: Payer: Self-pay | Admitting: Oncology

## 2024-08-04 VITALS — BP 154/55 | HR 67 | Resp 20

## 2024-08-04 VITALS — BP 146/62 | HR 73 | Temp 96.3°F | Resp 28 | Ht 61.0 in | Wt 170.6 lb

## 2024-08-04 DIAGNOSIS — C439 Malignant melanoma of skin, unspecified: Secondary | ICD-10-CM

## 2024-08-04 DIAGNOSIS — Z5112 Encounter for antineoplastic immunotherapy: Secondary | ICD-10-CM | POA: Diagnosis not present

## 2024-08-04 LAB — CBC WITH DIFFERENTIAL (CANCER CENTER ONLY)
Abs Immature Granulocytes: 0.02 K/uL (ref 0.00–0.07)
Basophils Absolute: 0 K/uL (ref 0.0–0.1)
Basophils Relative: 0 %
Eosinophils Absolute: 0.1 K/uL (ref 0.0–0.5)
Eosinophils Relative: 2 %
HCT: 40.6 % (ref 36.0–46.0)
Hemoglobin: 13.5 g/dL (ref 12.0–15.0)
Immature Granulocytes: 0 %
Lymphocytes Relative: 30 %
Lymphs Abs: 1.8 K/uL (ref 0.7–4.0)
MCH: 30.2 pg (ref 26.0–34.0)
MCHC: 33.3 g/dL (ref 30.0–36.0)
MCV: 90.8 fL (ref 80.0–100.0)
Monocytes Absolute: 0.5 K/uL (ref 0.1–1.0)
Monocytes Relative: 9 %
Neutro Abs: 3.5 K/uL (ref 1.7–7.7)
Neutrophils Relative %: 59 %
Platelet Count: 175 K/uL (ref 150–400)
RBC: 4.47 MIL/uL (ref 3.87–5.11)
RDW: 12.6 % (ref 11.5–15.5)
WBC Count: 5.9 K/uL (ref 4.0–10.5)
nRBC: 0 % (ref 0.0–0.2)

## 2024-08-04 LAB — CMP (CANCER CENTER ONLY)
ALT: 18 U/L (ref 0–44)
AST: 24 U/L (ref 15–41)
Albumin: 3.7 g/dL (ref 3.5–5.0)
Alkaline Phosphatase: 60 U/L (ref 38–126)
Anion gap: 8 (ref 5–15)
BUN: 16 mg/dL (ref 8–23)
CO2: 25 mmol/L (ref 22–32)
Calcium: 8.6 mg/dL — ABNORMAL LOW (ref 8.9–10.3)
Chloride: 102 mmol/L (ref 98–111)
Creatinine: 0.64 mg/dL (ref 0.44–1.00)
GFR, Estimated: >60 mL/min (ref >60)
Glucose, Bld: 95 mg/dL (ref 70–99)
Potassium: 4.1 mmol/L (ref 3.5–5.1)
Sodium: 135 mmol/L (ref 135–145)
Total Bilirubin: 0.6 mg/dL (ref 0.0–1.2)
Total Protein: 7 g/dL (ref 6.5–8.1)

## 2024-08-04 MED ORDER — SODIUM CHLORIDE 0.9 % IV SOLN
200.0000 mg | Freq: Once | INTRAVENOUS | Status: AC
  Administered 2024-08-04: 200 mg via INTRAVENOUS
  Filled 2024-08-04: qty 8

## 2024-08-04 MED ORDER — SODIUM CHLORIDE 0.9 % IV SOLN
INTRAVENOUS | Status: DC
  Filled 2024-08-04: qty 250

## 2024-08-04 NOTE — Progress Notes (Signed)
 Pt would like to discuss having her port removed after last treatment next month.

## 2024-08-04 NOTE — Patient Instructions (Signed)

## 2024-08-04 NOTE — Progress Notes (Signed)
 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  This was followed by a PET CT scan on 06/11/2023 which showed a 2.8 cm right level 2 lymph node with an SUV of 31.  Peripheral nodular hypermetabolism compatible with irregular rim enhancement.  12 mm nodular component along the inferior aspect of the lesion measuring SUV 10.1.  No other evidence of distant metastatic disease.  Core biopsy showed necrosis but was not conclusive for malignancy.  She was then referred to [facility] and underwent definitive excision of the right cervical lymph node on 07/17/2023.  Pathology showed malignant neoplasm with extensive necrosis consistent with metastatic melanoma.  BRAF negative surgical margins negative.  Right level 2A, 2B, 3 and 4 lymph nodes and right tonsil as well as right retromolar trigone biopsy all negative for malignancy. She was staged as stage IIIB T0 N1b M0.  She received adjuvant radiation therapy in [facility].  Pros and cons of adjuvant immunotherapy discussed with the patient and patient chose to receive care closer to home in [place].  Interval history-tolerating treatments well so far and denies any complaints today  ECOG PS- 0 Pain scale- 0   Review of systems- Review of Systems  Constitutional:  Negative for chills, fever, malaise/fatigue and weight loss.  HENT:  Negative for  congestion, ear discharge and nosebleeds.   Eyes:  Negative for blurred vision.  Respiratory:  Negative for cough, hemoptysis, sputum production, shortness of breath and wheezing.   Cardiovascular:  Negative for chest pain, palpitations, orthopnea and claudication.  Gastrointestinal:  Negative for abdominal pain, blood in stool, constipation, diarrhea, heartburn, melena, nausea and vomiting.  Genitourinary:  Negative for dysuria, flank pain, frequency, hematuria and urgency.  Musculoskeletal:  Negative for back pain, joint pain and myalgias.  Skin:  Negative for rash.  Neurological:  Negative for dizziness, tingling, focal weakness, seizures, weakness and headaches.  Endo/Heme/Allergies:  Does not bruise/bleed easily.  Psychiatric/Behavioral:  Negative for depression and suicidal ideas. The patient does not have insomnia.       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   IR IMAGING GUIDED PORT INSERTION  08/21/2023   KNEE ARTHROSCOPY W/ MENISCAL REPAIR Left    PARTIAL KNEE ARTHROPLASTY Right  09/16/2017   PARTIAL KNEE ARTHROPLASTY Right 09/16/2017   Procedure: RIGHT UNICOMPARTMENTAL KNEE;  Surgeon: Vernetta Lonni GRADE, MD;  Location: [facility] OR;  Service: Orthopedics;  Laterality: Right;   SHOULDER ARTHROSCOPY Left 11/2018   TOTAL KNEE REVISION Right 06/15/2019   Procedure: RIGHT UNICOMPARTMENTAL ARTHROPLASTY TO TOTAL KNEE ARTHROPLASTY;  Surgeon: Vernetta Lonni GRADE, MD;  Location: [facility] OR;  Service: Orthopedics;  Laterality: Right;   TUBAL LIGATION      Social History   Socioeconomic History   Marital status: Married    Spouse name: Trishna Cwik   Number of children: 2   Years of education: Not on file   Highest education level: 12th grade  Occupational History   Occupation: Retired  Tobacco Use   Smoking status: Former    Current packs/day: 0.00    Types: Cigarettes    Quit date: 12/28/1986    Years since quitting: 37.6   Smokeless tobacco: Never  Vaping Use   Vaping status: Never Used  Substance and Sexual Activity   Alcohol use: No   Drug use: No   Sexual activity: Not Currently  Other Topics Concern   Not on file  Social History Narrative   Not on file   Social Drivers of Health   Financial Resource Strain: Low Risk  (03/10/2024)   Overall Financial Resource Strain (CARDIA)    Difficulty of Paying Living Expenses: Not hard at all  Food Insecurity: No Food Insecurity (05/12/2024)   Hunger Vital Sign    Worried About Running Out of Food in the Last Year: Never true    Ran Out of Food in the Last Year: Never true  Transportation Needs: No Transportation Needs (05/12/2024)   PRAPARE - Administrator, Civil Service (Medical): No    Lack of Transportation (Non-Medical): No  Physical Activity: Sufficiently Active (03/10/2024)   Exercise Vital Sign    Days of Exercise per Week: 5 days    Minutes of Exercise per Session: 30 min  Stress: No Stress Concern Present (03/10/2024)   [name]    Feeling of Stress : Not at all  Social Connections: Socially Integrated (03/10/2024)   Social Connection and Isolation Panel    Frequency of Communication with Friends and Family: More than three times a week    Frequency of Social Gatherings with Friends and Family: Once a week    Attends Religious Services: More than 4 times per year    Active Member of [employer] or  Organizations: Yes    Attends Engineer, Structural: More than 4 times per year    Marital Status: Married  Catering Manager Violence: Not At Risk (05/12/2024)   Humiliation, Afraid, Rape, and Kick questionnaire    Fear of Current or Ex-Partner: No    Emotionally Abused: No    Physically Abused: No    Sexually Abused: No    Family History  Problem Relation Age of Onset   Breast cancer Neg Hx      Current Outpatient Medications:    acetaminophen  (TYLENOL ) 325 MG tablet, Take by mouth., Disp: , Rfl:    aspirin  EC 81 MG tablet, Take 81 mg by mouth daily., Disp: , Rfl:    atorvastatin  (LIPITOR) 10 MG tablet, TAKE 1 TABLET BY MOUTH EVERY DAY, Disp: 90 tablet, Rfl: 1   Blood Glucose Monitoring Suppl DEVI, Use to check blood sugar twice a day.  DX R73.03. May substitute to any manufacturer covered  by patient's insurance., Disp: 1 each, Rfl: 0   Calcium  Carb-Cholecalciferol  600-800 MG-UNIT TABS, Take 1 tablet by mouth 2 (two) times daily. , Disp: , Rfl:    cetirizine (ZYRTEC) 10 MG tablet, Take 10 mg by mouth daily., Disp: , Rfl:    clotrimazole -betamethasone  (LOTRISONE ) cream, APPLY 1-2 TIMES A DAY AS NEEDED FOR SPOT TREATMENT FOR YEAST SKIN RASH, Disp: 30 g, Rfl: 1   esomeprazole  (NEXIUM ) 20 MG capsule, TAKE 1 CAPSULE (20 MG TOTAL) BY MOUTH 2 (TWO) TIMES DAILY BEFORE A MEAL., Disp: 180 capsule, Rfl: 1   fluticasone  (FLONASE ) 50 MCG/ACT nasal spray, Place 2 sprays into the nose at bedtime. , Disp: , Rfl:    Glucosamine-Chondroitin (COSAMIN DS PO), Take 1 tablet by mouth 2 (two) times daily., Disp: , Rfl:    Lancets Misc. MISC, Use to check blood sugar twice a day.  DX R73.03. May substitute to any manufacturer covered by patient's insurance., Disp: 100 each, Rfl: 0   lidocaine -prilocaine  (EMLA ) cream, Apply to affected area once, Disp: 30 g, Rfl: 3   Multiple Vitamins-Minerals (MULTIVITAMIN WITH MINERALS) tablet, Take 1 tablet by mouth every evening. Women's One-A-Day, Disp: , Rfl:     nystatin  (MYCOSTATIN /NYSTOP ) powder, APPLY TO AFFECTED AREA 3 TIMES A DAY, Disp: 30 g, Rfl: 1   Omega-3 Fatty Acids (FISH OIL) 1000 MG CAPS, Take 1,000 mg by mouth daily., Disp: , Rfl:    ondansetron  (ZOFRAN ) 8 MG tablet, Take 1 tablet (8 mg total) by mouth every 8 (eight) hours as needed for nausea or vomiting., Disp: 30 tablet, Rfl: 1   ONETOUCH VERIO test strip, USE TO CHECK BLOOD SUGAR TWICE A DAY. DX R73.03., Disp: 100 strip, Rfl: 1   prochlorperazine  (COMPAZINE ) 10 MG tablet, Take 1 tablet (10 mg total) by mouth every 6 (six) hours as needed for nausea or vomiting., Disp: 30 tablet, Rfl: 1   TURMERIC PO, Take 538 mg by mouth daily., Disp: , Rfl:  No current facility-administered medications for this visit.  Facility-Administered Medications Ordered in Other Visits:    0.9 %  sodium chloride  infusion, , Intravenous, Continuous, Melanee Annah BROCKS, MD   pembrolizumab  (KEYTRUDA ) 200 mg in sodium chloride  0.9 % 50 mL chemo infusion, 200 mg, Intravenous, Once, Melanee Annah BROCKS, MD  Physical exam:  Vitals:   08/04/24 0854 08/04/24 0905  BP: (!) 152/66 (!) 146/62  Pulse: 73   Resp: (!) 28   Temp: (!) 96.3 F (35.7 C)   TempSrc: Tympanic   SpO2: 95%   Weight: 170 lb 9.6 oz (77.4 kg)   Height: 5' 1 (1.549 m)    Physical Exam Eyes:     Pupils: Pupils are equal, round, and reactive to light.  Cardiovascular:     Rate and Rhythm: Normal rate and regular rhythm.     Heart sounds: Normal heart sounds.  Pulmonary:     Effort: Pulmonary effort is normal.     Breath sounds: Normal breath sounds.  Skin:    General: Skin is warm and dry.  Neurological:     Mental Status: She is alert and oriented to person, place, and time.      I have personally reviewed labs listed below:    Latest Ref Rng & Units 08/04/2024    8:47 AM  CMP  Glucose 70 - 99 mg/dL 95   BUN 8 - 23 mg/dL 16   Creatinine 9.55 - 1.00 mg/dL 9.35   Sodium 864 - 854 mmol/L 135   Potassium  3.5 - 5.1 mmol/L 4.1   Chloride 98  - 111 mmol/L 102   CO2 22 - 32 mmol/L 25   Calcium  8.9 - 10.3 mg/dL 8.6   Total Protein 6.5 - 8.1 g/dL 7.0   Total Bilirubin 0.0 - 1.2 mg/dL 0.6   Alkaline Phos 38 - 126 U/L 60   AST 15 - 41 U/L 24   ALT 0 - 44 U/L 18       Latest Ref Rng & Units 08/04/2024    8:47 AM  CBC  WBC 4.0 - 10.5 K/uL 5.9   Hemoglobin 12.0 - 15.0 g/dL 86.4   Hematocrit 63.9 - 46.0 % 40.6   Platelets 150 - 400 K/uL 175      Assessment and plan- Patient is a 83 y.o. 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  She is here for on treatment assessment prior to cycle 17 of adjuvant Keytruda      Stage III melanoma Stage III melanoma treated with Keytruda  for one year. No disease progression on last scan. Asymptomatic. - Administer final Keytruda  treatment in three weeks. - Arrange port removal after final Keytruda  treatment. - Schedule biannual scans, next in March. Assessment & Plan      Visit Diagnosis 1. Melanoma of skin (HCC)   2. Encounter for antineoplastic immunotherapy      Dr. Annah Skene, MD, MPH [facility] [phone] 08/04/2024 9:31 AM

## 2024-08-09 ENCOUNTER — Encounter: Payer: Self-pay | Admitting: Radiology

## 2024-08-12 DIAGNOSIS — D1801 Hemangioma of skin and subcutaneous tissue: Secondary | ICD-10-CM | POA: Diagnosis not present

## 2024-08-12 DIAGNOSIS — L80 Vitiligo: Secondary | ICD-10-CM | POA: Diagnosis not present

## 2024-08-12 DIAGNOSIS — L821 Other seborrheic keratosis: Secondary | ICD-10-CM | POA: Diagnosis not present

## 2024-08-12 DIAGNOSIS — L814 Other melanin hyperpigmentation: Secondary | ICD-10-CM | POA: Diagnosis not present

## 2024-08-12 DIAGNOSIS — R233 Spontaneous ecchymoses: Secondary | ICD-10-CM | POA: Diagnosis not present

## 2024-08-16 DIAGNOSIS — C760 Malignant neoplasm of head, face and neck: Secondary | ICD-10-CM | POA: Diagnosis not present

## 2024-08-19 DIAGNOSIS — H6123 Impacted cerumen, bilateral: Secondary | ICD-10-CM | POA: Diagnosis not present

## 2024-08-25 ENCOUNTER — Inpatient Hospital Stay: Attending: Oncology

## 2024-08-25 ENCOUNTER — Inpatient Hospital Stay

## 2024-08-25 ENCOUNTER — Telehealth: Payer: Self-pay

## 2024-08-25 ENCOUNTER — Inpatient Hospital Stay: Attending: Oncology | Admitting: Oncology

## 2024-08-25 VITALS — BP 129/54 | HR 73 | Temp 97.6°F | Resp 19 | Ht 61.0 in | Wt 169.7 lb

## 2024-08-25 DIAGNOSIS — Z5112 Encounter for antineoplastic immunotherapy: Secondary | ICD-10-CM | POA: Diagnosis not present

## 2024-08-25 DIAGNOSIS — C439 Malignant melanoma of skin, unspecified: Secondary | ICD-10-CM

## 2024-08-25 DIAGNOSIS — H16223 Keratoconjunctivitis sicca, not specified as Sjogren's, bilateral: Secondary | ICD-10-CM | POA: Diagnosis not present

## 2024-08-25 DIAGNOSIS — Z79899 Other long term (current) drug therapy: Secondary | ICD-10-CM | POA: Diagnosis not present

## 2024-08-25 DIAGNOSIS — H2513 Age-related nuclear cataract, bilateral: Secondary | ICD-10-CM | POA: Diagnosis not present

## 2024-08-25 LAB — CBC WITH DIFFERENTIAL (CANCER CENTER ONLY)
Abs Immature Granulocytes: 0.01 K/uL (ref 0.00–0.07)
Basophils Absolute: 0 K/uL (ref 0.0–0.1)
Basophils Relative: 1 %
Eosinophils Absolute: 0.2 K/uL (ref 0.0–0.5)
Eosinophils Relative: 3 %
HCT: 40.8 % (ref 36.0–46.0)
Hemoglobin: 13.5 g/dL (ref 12.0–15.0)
Immature Granulocytes: 0 %
Lymphocytes Relative: 38 %
Lymphs Abs: 2.2 K/uL (ref 0.7–4.0)
MCH: 29.8 pg (ref 26.0–34.0)
MCHC: 33.1 g/dL (ref 30.0–36.0)
MCV: 90.1 fL (ref 80.0–100.0)
Monocytes Absolute: 0.5 K/uL (ref 0.1–1.0)
Monocytes Relative: 9 %
Neutro Abs: 2.9 K/uL (ref 1.7–7.7)
Neutrophils Relative %: 49 %
Platelet Count: 157 K/uL (ref 150–400)
RBC: 4.53 MIL/uL (ref 3.87–5.11)
RDW: 12.4 % (ref 11.5–15.5)
WBC Count: 5.9 K/uL (ref 4.0–10.5)
nRBC: 0 % (ref 0.0–0.2)

## 2024-08-25 LAB — CMP (CANCER CENTER ONLY)
ALT: 17 U/L (ref 0–44)
AST: 23 U/L (ref 15–41)
Albumin: 3.7 g/dL (ref 3.5–5.0)
Alkaline Phosphatase: 70 U/L (ref 38–126)
Anion gap: 9 (ref 5–15)
BUN: 22 mg/dL (ref 8–23)
CO2: 25 mmol/L (ref 22–32)
Calcium: 8.9 mg/dL (ref 8.9–10.3)
Chloride: 104 mmol/L (ref 98–111)
Creatinine: 0.76 mg/dL (ref 0.44–1.00)
GFR, Estimated: >60 mL/min (ref >60)
Glucose, Bld: 103 mg/dL — ABNORMAL HIGH (ref 70–99)
Potassium: 4 mmol/L (ref 3.5–5.1)
Sodium: 138 mmol/L (ref 135–145)
Total Bilirubin: 0.5 mg/dL (ref 0.0–1.2)
Total Protein: 6.9 g/dL (ref 6.5–8.1)

## 2024-08-25 LAB — OPHTHALMOLOGY REPORT-SCANNED

## 2024-08-25 LAB — TSH: TSH: 3.2 u[IU]/mL (ref 0.350–4.500)

## 2024-08-25 MED ORDER — SODIUM CHLORIDE 0.9 % IV SOLN
200.0000 mg | Freq: Once | INTRAVENOUS | Status: AC
  Administered 2024-08-25: 200 mg via INTRAVENOUS
  Filled 2024-08-25: qty 8

## 2024-08-25 MED ORDER — SODIUM CHLORIDE 0.9 % IV SOLN
INTRAVENOUS | Status: DC
  Filled 2024-08-25: qty 250

## 2024-08-25 NOTE — Progress Notes (Signed)
 Patient feeling good and wants to know if this is her last treatment? No other new or acute concerns at this time.

## 2024-08-25 NOTE — Patient Instructions (Signed)
 [facility] A DEPT [facility]  Discharge Instructions: Thank you for choosing [facility] to provide your oncology and hematology care.  If you have a lab appointment with the Cancer Center, please go directly to the Cancer Center and check in at the registration area.  Wear comfortable clothing and clothing appropriate for easy access to any Portacath or PICC line.   We strive to give you quality time with your provider. You may need to reschedule your appointment if you arrive late (15 or more minutes).  Arriving late affects you and other patients whose appointments are after yours.  Also, if you miss three or more appointments without notifying the office, you may be dismissed from the clinic at the provider's discretion.      For prescription refill requests, have your pharmacy contact our office and allow 72 hours for refills to be completed.    Today you received the following chemotherapy and/or immunotherapy agents [name]      To help prevent nausea and vomiting after your treatment, we encourage you to take your nausea medication as directed.  BELOW ARE SYMPTOMS THAT SHOULD BE REPORTED IMMEDIATELY: *FEVER GREATER THAN 100.4 F (38 C) OR HIGHER *CHILLS OR SWEATING *NAUSEA AND VOMITING THAT IS NOT CONTROLLED WITH YOUR NAUSEA MEDICATION *UNUSUAL SHORTNESS OF BREATH *UNUSUAL BRUISING OR BLEEDING *URINARY PROBLEMS (pain or burning when urinating, or frequent urination) *BOWEL PROBLEMS (unusual diarrhea, constipation, pain near the anus) TENDERNESS IN MOUTH AND THROAT WITH OR WITHOUT PRESENCE OF ULCERS (sore throat, sores in mouth, or a toothache) UNUSUAL RASH, SWELLING OR PAIN  UNUSUAL VAGINAL DISCHARGE OR ITCHING   Items with * indicate a potential emergency and should be followed up as soon as possible or go to the Emergency Department if any problems should occur.  Please show the CHEMOTHERAPY ALERT CARD or IMMUNOTHERAPY  ALERT CARD at check-in to the Emergency Department and triage nurse.  Should you have questions after your visit or need to cancel or reschedule your appointment, please contact [facility] - A DEPT OF [facility]  [phone] and follow the prompts.  Office hours are 8:00 a.m. to 4:30 p.m. Monday - Friday. Please note that voicemails left after 4:00 p.m. may not be returned until the following business day.  We are closed weekends and major holidays. You have access to a nurse at all times for urgent questions. Please call the main number to the clinic [phone] and follow the prompts.  For any non-urgent questions, you may also contact your provider using MyChart. We now offer e-Visits for anyone 48 and older to request care online for non-urgent symptoms. For details visit [email or web address].   Also download the MyChart app! Go to the app store, search "MyChart", open the app, select [place], and log in with your MyChart username and password.

## 2024-08-25 NOTE — Progress Notes (Signed)
 Patient in need of port removal; port  placed 08/21/23 by Dr. Alona.  Per Clarita I can put her on this Friday 11/21 at 2p an arrive at 1:45 or Thurs at 2:30 an arrive at 2:15p. She does not have to be NPO and she can drive herself. She would check in at the H&V entrance.  Outbound call to patient; voice message left on [phone].

## 2024-08-25 NOTE — Progress Notes (Signed)
 Patient for IR Port Removal on Thurs 08/26/24, I called and spoke with the patient on the phone and gave pre-procedure instructions. Pt was made aware to be here at 2:15p and check in at the H&V entrance. Pt stated understanding. Called 08/25/24

## 2024-08-25 NOTE — Progress Notes (Signed)
 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  This was followed by a PET CT scan on 06/11/2023 which showed a 2.8 cm right level 2 lymph node with an SUV of 31.  Peripheral nodular hypermetabolism compatible with irregular rim enhancement.  12 mm nodular component along the inferior aspect of the lesion measuring SUV 10.1.  No other evidence of distant metastatic disease.  Core biopsy showed necrosis but was not conclusive for malignancy.  She was then referred to [facility] and underwent definitive excision of the right cervical lymph node on 07/17/2023.  Pathology showed malignant neoplasm with extensive necrosis consistent with metastatic melanoma.  BRAF negative surgical margins negative.  Right level 2A, 2B, 3 and 4 lymph nodes and right tonsil as well as right retromolar trigone biopsy all negative for malignancy. She was staged as stage IIIB T0 N1b M0.  She received adjuvant radiation therapy in [facility].  She is currently completing adjuvant Keytruda   Interval history-tolerating treatments well so far and denies any concerns or side effects today.  She has a CT neck scheduled with [facility] next month  ECOG PS- 1 Pain scale- 0   Review of systems- Review of Systems  Constitutional:  Negative for chills, fever, malaise/fatigue and weight loss.  HENT:  Negative for congestion, ear  discharge and nosebleeds.   Eyes:  Negative for blurred vision.  Respiratory:  Negative for cough, hemoptysis, sputum production, shortness of breath and wheezing.   Cardiovascular:  Negative for chest pain, palpitations, orthopnea and claudication.  Gastrointestinal:  Negative for abdominal pain, blood in stool, constipation, diarrhea, heartburn, melena, nausea and vomiting.  Genitourinary:  Negative for dysuria, flank pain, frequency, hematuria and urgency.  Musculoskeletal:  Negative for back pain, joint pain and myalgias.  Skin:  Negative for rash.  Neurological:  Negative for dizziness, tingling, focal weakness, seizures, weakness and headaches.  Endo/Heme/Allergies:  Does not bruise/bleed easily.  Psychiatric/Behavioral:  Negative for depression and suicidal ideas. The patient does not have insomnia.       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   IR IMAGING GUIDED PORT INSERTION  08/21/2023   KNEE ARTHROSCOPY W/ MENISCAL REPAIR Left    PARTIAL KNEE ARTHROPLASTY Right 09/16/2017  PARTIAL KNEE ARTHROPLASTY Right 09/16/2017   Procedure: RIGHT UNICOMPARTMENTAL KNEE;  Surgeon: Vernetta Lonni GRADE, MD;  Location: [facility] OR;  Service: Orthopedics;  Laterality: Right;   SHOULDER ARTHROSCOPY Left 11/2018   TOTAL KNEE REVISION Right 06/15/2019   Procedure: RIGHT UNICOMPARTMENTAL ARTHROPLASTY TO TOTAL KNEE ARTHROPLASTY;  Surgeon: Vernetta Lonni GRADE, MD;   Location: [facility] OR;  Service: Orthopedics;  Laterality: Right;   TUBAL LIGATION      Social History   Socioeconomic History   Marital status: Married    Spouse name: Meg Niemeier   Number of children: 2   Years of education: Not on file   Highest education level: 12th grade  Occupational History   Occupation: Retired  Tobacco Use   Smoking status: Former    Current packs/day: 0.00    Types: Cigarettes    Quit date: 12/28/1986    Years since quitting: 37.6   Smokeless tobacco: Never  Vaping Use   Vaping status: Never Used  Substance and Sexual Activity   Alcohol use: No   Drug use: No   Sexual activity: Not Currently  Other Topics Concern   Not on file  Social History Narrative   Not on file   Social Drivers of Health   Financial Resource Strain: Low Risk  (08/23/2024)   Overall Financial Resource Strain (CARDIA)    Difficulty of Paying Living Expenses: Not hard at all  Food Insecurity: No Food Insecurity (08/23/2024)   Hunger Vital Sign    Worried About Running Out of Food in the Last Year: Never true    Ran Out of Food in the Last Year: Never true  Transportation Needs: No Transportation Needs (08/23/2024)   PRAPARE - Administrator, Civil Service (Medical): No    Lack of Transportation (Non-Medical): No  Physical Activity: Inactive (08/23/2024)   Exercise Vital Sign    Days of Exercise per Week: 0 days    Minutes of Exercise per Session: Not on file  Stress: No Stress Concern Present (08/23/2024)   [name]    Feeling of Stress: Not at all  Social Connections: Socially Integrated (08/23/2024)   Social Connection and Isolation Panel    Frequency of Communication with Friends and Family: More than three times a week    Frequency of Social Gatherings with Friends and Family: Twice a week    Attends Religious Services: More than 4 times per year    Active Member of [employer] or  Organizations: Yes    Attends Engineer, Structural: More than 4 times per year    Marital Status: Married  Catering Manager Violence: Not At Risk (05/12/2024)   Humiliation, Afraid, Rape, and Kick questionnaire    Fear of Current or Ex-Partner: No    Emotionally Abused: No    Physically Abused: No    Sexually Abused: No    Family History  Problem Relation Age of Onset   Breast cancer Neg Hx      Current Outpatient Medications:    acetaminophen  (TYLENOL ) 325 MG tablet, Take by mouth., Disp: , Rfl:    aspirin  EC 81 MG tablet, Take 81 mg by mouth daily., Disp: , Rfl:    atorvastatin  (LIPITOR) 10 MG tablet, TAKE 1 TABLET BY MOUTH EVERY DAY, Disp: 90 tablet, Rfl: 1   Blood Glucose Monitoring Suppl DEVI, Use to check blood sugar twice a day.  DX R73.03. May substitute to any manufacturer covered by patient's insurance., Disp:  1 each, Rfl: 0   Calcium  Carb-Cholecalciferol  600-800 MG-UNIT TABS, Take 1 tablet by mouth 2 (two) times daily. , Disp: , Rfl:    cetirizine (ZYRTEC) 10 MG tablet, Take 10 mg by mouth daily., Disp: , Rfl:    clotrimazole -betamethasone  (LOTRISONE ) cream, APPLY 1-2 TIMES A DAY AS NEEDED FOR SPOT TREATMENT FOR YEAST SKIN RASH, Disp: 30 g, Rfl: 1   Cranberry (AZO CRANBERRY GUMMIES) 250 MG CHEW, , Disp: , Rfl:    D-Mannose 500 MG CAPS, , Disp: , Rfl:    esomeprazole  (NEXIUM ) 20 MG capsule, TAKE 1 CAPSULE (20 MG TOTAL) BY MOUTH 2 (TWO) TIMES DAILY BEFORE A MEAL., Disp: 180 capsule, Rfl: 1   fluticasone  (FLONASE ) 50 MCG/ACT nasal spray, Place 2 sprays into the nose at bedtime. , Disp: , Rfl:    Glucosamine-Chondroitin (COSAMIN DS PO), Take 1 tablet by mouth 2 (two) times daily., Disp: , Rfl:    Lancets Misc. MISC, Use to check blood sugar twice a day.  DX R73.03. May substitute to any manufacturer covered by patient's insurance., Disp: 100 each, Rfl: 0   lidocaine -prilocaine  (EMLA ) cream, Apply to affected area once, Disp: 30 g, Rfl: 3   Multiple Vitamins-Minerals  (MULTIVITAMIN WITH MINERALS) tablet, Take 1 tablet by mouth every evening. Women's One-A-Day, Disp: , Rfl:    nystatin  (MYCOSTATIN /NYSTOP ) powder, APPLY TO AFFECTED AREA 3 TIMES A DAY, Disp: 30 g, Rfl: 1   Omega-3 Fatty Acids (FISH OIL) 1000 MG CAPS, Take 1,000 mg by mouth daily., Disp: , Rfl:    ondansetron  (ZOFRAN ) 8 MG tablet, Take 1 tablet (8 mg total) by mouth every 8 (eight) hours as needed for nausea or vomiting., Disp: 30 tablet, Rfl: 1   ONETOUCH VERIO test strip, USE TO CHECK BLOOD SUGAR TWICE A DAY. DX R73.03., Disp: 100 strip, Rfl: 1   prochlorperazine  (COMPAZINE ) 10 MG tablet, Take 1 tablet (10 mg total) by mouth every 6 (six) hours as needed for nausea or vomiting., Disp: 30 tablet, Rfl: 1   TURMERIC PO, Take 538 mg by mouth daily., Disp: , Rfl:  No current facility-administered medications for this visit.  Facility-Administered Medications Ordered in Other Visits:    0.9 %  sodium chloride  infusion, , Intravenous, Continuous, Melanee Annah BROCKS, MD, Last Rate: 10 mL/hr at 08/25/24 0941, New Bag at 08/25/24 0941   pembrolizumab  (KEYTRUDA ) 200 mg in sodium chloride  0.9 % 50 mL chemo infusion, 200 mg, Intravenous, Once, Melanee Annah BROCKS, MD  Physical exam:  Vitals:   08/25/24 0908 08/25/24 0910  BP: (!) 146/59 (!) 129/54  Pulse: 73   Resp: 19   Temp: 97.6 F (36.4 C)   TempSrc: Tympanic   SpO2: 95%   Weight: 169 lb 11.2 oz (77 kg)   Height: 5' 1 (1.549 m)    Physical Exam Cardiovascular:     Rate and Rhythm: Normal rate and regular rhythm.     Heart sounds: Normal heart sounds.  Pulmonary:     Effort: Pulmonary effort is normal.     Breath sounds: Normal breath sounds.  Skin:    General: Skin is warm and dry.  Neurological:     Mental Status: She is alert and oriented to person, place, and time.      I have personally reviewed labs listed below:    Latest Ref Rng & Units 08/25/2024    8:36 AM  CMP  Glucose 70 - 99 mg/dL 896   BUN 8 - 23 mg/dL 22   Creatinine  0.44  - 1.00 mg/dL 9.23   Sodium 864 - 854 mmol/L 138   Potassium 3.5 - 5.1 mmol/L 4.0   Chloride 98 - 111 mmol/L 104   CO2 22 - 32 mmol/L 25   Calcium  8.9 - 10.3 mg/dL 8.9   Total Protein 6.5 - 8.1 g/dL 6.9   Total Bilirubin 0.0 - 1.2 mg/dL 0.5   Alkaline Phos 38 - 126 U/L 70   AST 15 - 41 U/L 23   ALT 0 - 44 U/L 17       Latest Ref Rng & Units 08/25/2024    8:36 AM  CBC  WBC 4.0 - 10.5 K/uL 5.9   Hemoglobin 12.0 - 15.0 g/dL 86.4   Hematocrit 63.9 - 46.0 % 40.8   Platelets 150 - 400 K/uL 157       Assessment and plan- Patient is a 83 y.o. female with history of stage IIIb malignant melanoma s/p surgery and adjuvant radiation here for on treatment assessment prior to cycle 18 of adjuvant Keytruda   Assessment and Plan    Stage III melanoma, status post surgery and immunotherapy Stage III melanoma, post-surgery and Keytruda  immunotherapy, currently in final treatment session. - Remove port after today's treatment. - Schedule PET scan mid-March for melanoma follow-up. - Schedule follow-up appointment a few weeks post-PET scan with CBC with differential CMP and LDH - CT scan of the neck scheduled for December 1st, ordered by surgeon.       Visit Diagnosis 1. Melanoma of skin (HCC)   2. Encounter for antineoplastic immunotherapy      Dr. Annah Skene, MD, MPH [facility] [phone] 08/25/2024 9:57 AM

## 2024-08-25 NOTE — Telephone Encounter (Signed)
 Patient requesting a call back [phone].  Outbound call;patient agreed to port removal Thursday at 2:30 an arrive at 2:15p.  Informed can drive self and no need to be NPO.  Patient verbalized understanding.

## 2024-08-25 NOTE — Telephone Encounter (Signed)
 Patient in need of port removal; port  placed 08/21/23 by Dr. Alona.  Per Clarita I can put her on this Friday 11/21 at 2p an arrive at 1:45 or Thurs at 2:30 an arrive at 2:15p. She does not have to be NPO and she can drive herself. She would check in at the H&V entrance.  Outbound call to patient; voice message left on [phone].

## 2024-08-26 ENCOUNTER — Other Ambulatory Visit: Payer: Self-pay

## 2024-08-26 ENCOUNTER — Ambulatory Visit
Admission: RE | Admit: 2024-08-26 | Discharge: 2024-08-26 | Disposition: A | Discharge status: Home / Self Care | Source: Ambulatory Visit | Attending: Oncology | Admitting: Oncology

## 2024-08-26 DIAGNOSIS — Z452 Encounter for adjustment and management of vascular access device: Secondary | ICD-10-CM | POA: Insufficient documentation

## 2024-08-26 DIAGNOSIS — C439 Malignant melanoma of skin, unspecified: Secondary | ICD-10-CM | POA: Diagnosis not present

## 2024-08-26 HISTORY — PX: IR REMOVAL TUN ACCESS W/ PORT W/O FL MOD SED: IMG2290

## 2024-08-26 LAB — T4: T4, Total: 7.1 ug/dL (ref 4.5–12.0)

## 2024-08-26 MED ORDER — LIDOCAINE HCL 1 % IJ SOLN
7.0000 mL | Freq: Once | INTRAMUSCULAR | Status: AC
  Administered 2024-08-26: 7 mL via INTRADERMAL

## 2024-08-26 MED ORDER — LIDOCAINE HCL 1 % IJ SOLN
INTRAMUSCULAR | Status: AC
Start: 2024-08-26 — End: 2024-08-26
  Filled 2024-08-26: qty 20

## 2024-08-26 NOTE — Procedures (Signed)
 Interventional Radiology Procedure Note  Procedure: Chest Port removal  Complications: None  Estimated Blood Loss: < 10 mL  Findings: right jugular vein chest port removed  Cordella DELENA Banner, MD

## 2024-08-27 ENCOUNTER — Ambulatory Visit: Payer: Self-pay

## 2024-08-27 VITALS — BP 136/78 | Ht 61.0 in | Wt 170.6 lb

## 2024-08-27 DIAGNOSIS — Z0000 Encounter for general adult medical examination without abnormal findings: Secondary | ICD-10-CM

## 2024-08-27 DIAGNOSIS — Z23 Encounter for immunization: Secondary | ICD-10-CM

## 2024-08-27 NOTE — Patient Instructions (Addendum)
 [name],  Thank you for taking the time for your Medicare Wellness Visit. I appreciate your continued commitment to your health goals. Please review the care plan we discussed, and feel free to reach out if I can assist you further.  Please note that Annual Wellness Visits do not include a physical exam. Some assessments may be limited, especially if the visit was conducted virtually. If needed, we may recommend an in-person follow-up with your provider.  Ongoing Care Seeing your primary care provider every 3 to 6 months helps us  monitor your health and provide consistent, personalized care.   Referrals If a referral was made during today's visit and you haven't received any updates within two weeks, please contact the referred provider directly to check on the status.  Recommended Screenings:   FLU SHOT GIVEN   Health Maintenance  Topic Date Due   Medicare Annual Wellness Visit  08/27/2025   Osteoporosis screening with Bone Density Scan  01/03/2026   DTaP/Tdap/Td vaccine (3 - Td or Tdap) 09/13/2032   Pneumococcal Vaccine for age over 78  Completed   Flu Shot  Completed   Zoster (Shingles) Vaccine  Completed   Meningitis B Vaccine  Aged Out   Breast Cancer Screening  Discontinued   COVID-19 Vaccine  Discontinued     Vision: Annual vision screenings are recommended for early detection of glaucoma, cataracts, and diabetic retinopathy. These exams can also reveal signs of chronic conditions such as diabetes and high blood pressure.  Dental: Annual dental screenings help detect early signs of oral cancer, gum disease, and other conditions linked to overall health, including heart disease and diabetes.  Please see the attached documents for additional preventive care recommendations.   NEXT AWV 08/31/25 @ 8:10 AM IN PERSON

## 2024-08-27 NOTE — Progress Notes (Signed)
 No chief complaint on file.    Subjective:   [name] is a 83 y.o. female who presents for a Medicare Annual Wellness Visit.  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   IR IMAGING GUIDED PORT INSERTION  08/21/2023   IR REMOVAL TUN ACCESS W/ PORT W/O FL MOD SED  08/26/2024   KNEE ARTHROSCOPY W/ MENISCAL REPAIR Left    PARTIAL KNEE ARTHROPLASTY Right 09/16/2017   PARTIAL KNEE ARTHROPLASTY Right 09/16/2017   Procedure: RIGHT UNICOMPARTMENTAL KNEE;  Surgeon: Vernetta Lonni GRADE, MD;  Location: [facility] OR;  Service: Orthopedics;  Laterality: Right;   SHOULDER ARTHROSCOPY Left 11/2018   TOTAL KNEE REVISION Right 06/15/2019   Procedure: RIGHT UNICOMPARTMENTAL ARTHROPLASTY TO TOTAL KNEE ARTHROPLASTY;  Surgeon: Vernetta Lonni GRADE, MD;  Location: [facility] OR;  Service: Orthopedics;  Laterality: Right;   TUBAL LIGATION     Family History  Problem Relation Age of Onset   Breast cancer Neg Hx    Social History   Occupational History   Occupation: Retired  Tobacco Use   Smoking status: Former    Current packs/day: 0.00    Types: Cigarettes    Quit date: 12/28/1986    Years since quitting: 37.6   Smokeless tobacco: Never  Vaping Use   Vaping status: Never Used  Substance and Sexual Activity   Alcohol use: No   Drug use: No   Sexual activity: Not Currently   Tobacco Counseling Counseling given: Not Answered  SDOH Screenings   Food Insecurity: No Food Insecurity (08/23/2024)  Housing: Unknown (08/23/2024)  Transportation Needs: No Transportation Needs (08/23/2024)   Utilities: Not At Risk (05/12/2024)  Alcohol Screen: Low Risk  (08/22/2023)  Depression (PHQ2-9): Low Risk  (08/25/2024)  Financial Resource Strain: Low Risk  (08/23/2024)  Physical Activity: Inactive (08/23/2024)  Social Connections: Socially Integrated (08/23/2024)  Stress: No Stress Concern Present (08/23/2024)  Tobacco Use: Medium Risk (08/19/2024)   Received from [facility]  Health Literacy: Adequate Health Literacy (08/22/2023)   See flowsheets for full screening details  Depression Screen PHQ 2 & 9 Depression Scale- Over the past 2 weeks, how often have you been bothered by any of the following problems? Little interest or pleasure in doing things: 0 Feeling down, depressed, or hopeless (PHQ Adolescent also includes...irritable): 0 PHQ-2 Total Score: 0 Trouble falling or staying asleep, or sleeping too much: 0 Feeling tired or having little energy: 0 Poor appetite or overeating (PHQ Adolescent also includes...weight loss): 0 Feeling bad about yourself - or that you are a failure or have let yourself or your family down: 0 Trouble concentrating on things, such as reading the newspaper or watching television (PHQ Adolescent also includes...like school work): 0 Moving or speaking so slowly that other people could have noticed. Or the opposite - being so fidgety or restless that you have been moving around a lot more than usual: 0 Thoughts that you would be better off dead, or of hurting yourself in some way: 0 PHQ-9 Total Score: 0  If you checked off any problems, how difficult have these problems made it for you to do your work, take care of things at home, or get along with other people?: Not difficult at all     Goals Addressed   None    Visit info / Clinical Intake: Interpreter Needed?: No  Functional Status Activities of Daily Living (to include ambulation/medication): (Patient-Rptd) Independent Ambulation: (Patient-Rptd) Independent Home Management: (Patient-Rptd)  Independent  Fall Screening Falls in the past year?: (Patient-Rptd) 0 Patient Fall Risk Level: Low Fall Risk  Advance Directives (For Healthcare) Does Patient Have a Medical Advance Directive?: No Does patient want to make changes to medical advance directive?: No - Patient declined Type of Advance Directive: Healthcare Power of [place]; Living will Copy of Healthcare Power of Attorney in Chart?: No - copy requested Copy of Living Will in Chart?: No - copy requested Would patient like information on creating a medical advance directive?: No - Patient declined        Objective:    There were no vitals filed for this visit. There is no height or weight on file to calculate BMI.  Current Medications (verified) Outpatient Encounter Medications as of 08/27/2024  Medication Sig   acetaminophen  (TYLENOL ) 325 MG tablet Take by mouth.   aspirin  EC 81 MG tablet Take 81 mg by mouth daily.   atorvastatin  (LIPITOR) 10 MG tablet TAKE 1 TABLET BY MOUTH EVERY DAY   Blood Glucose Monitoring Suppl DEVI Use to check blood sugar twice a day.  DX R73.03. May substitute to any manufacturer covered by patient's insurance.   Calcium  Carb-Cholecalciferol  600-800 MG-UNIT TABS Take 1 tablet by mouth 2 (two) times daily.    cetirizine (ZYRTEC) 10 MG tablet Take 10 mg by mouth daily.   clotrimazole -betamethasone  (LOTRISONE ) cream APPLY 1-2 TIMES A DAY AS NEEDED FOR SPOT TREATMENT FOR YEAST SKIN RASH   Cranberry (AZO CRANBERRY GUMMIES) 250 MG CHEW    D-Mannose 500 MG CAPS    esomeprazole  (NEXIUM ) 20 MG capsule TAKE 1 CAPSULE (20 MG TOTAL) BY MOUTH 2 (TWO) TIMES DAILY BEFORE A MEAL.   fluticasone  (FLONASE ) 50 MCG/ACT nasal spray Place 2 sprays into the nose at bedtime.    Glucosamine-Chondroitin (COSAMIN DS PO) Take 1 tablet by mouth 2 (two) times daily.   Lancets Misc. MISC Use to check blood sugar twice a day.  DX R73.03. May substitute to any manufacturer covered by patient's insurance.    lidocaine -prilocaine  (EMLA ) cream Apply to affected area once   Multiple Vitamins-Minerals (MULTIVITAMIN WITH MINERALS) tablet Take 1 tablet by mouth every evening. Women's One-A-Day   nystatin  (MYCOSTATIN /NYSTOP ) powder APPLY TO AFFECTED AREA 3 TIMES A DAY   Omega-3 Fatty Acids (FISH OIL) 1000 MG CAPS Take 1,000 mg by mouth daily.   ondansetron  (ZOFRAN ) 8 MG tablet Take 1 tablet (8 mg total) by mouth every 8 (eight) hours as needed for nausea or vomiting.   ONETOUCH VERIO test strip USE TO CHECK BLOOD SUGAR TWICE A DAY. DX R73.03.   prochlorperazine  (COMPAZINE ) 10 MG tablet Take 1 tablet (10 mg total) by mouth every 6 (six) hours as needed for nausea or vomiting.   TURMERIC PO Take 538 mg by mouth daily.   No facility-administered encounter medications on file as of 08/27/2024.   Hearing/Vision screen No results found. Immunizations and Health Maintenance Health Maintenance  Topic Date Due   Medicare Annual Wellness (AWV)  08/21/2024   Influenza Vaccine  10/08/2024 (Originally 05/07/2024)   DTaP/Tdap/Td (3 - Td  or Tdap) 09/13/2032   Pneumococcal Vaccine: 50+ Years  Completed   Bone Density Scan  Completed   Zoster Vaccines- Shingrix  Completed   Meningococcal B Vaccine  Aged Out   Mammogram  Discontinued   COVID-19 Vaccine  Discontinued        Assessment/Plan:  This is a routine wellness examination for Aneli.  Patient Care Team: Antonette Angeline ORN, NP as PCP - General (Internal Medicine) Melanee Annah BROCKS, MD as Consulting Physician (Oncology) Dingeldein, Elspeth, MD (Ophthalmology)  I have personally reviewed and noted the following in the patient's chart:   Medical and social history Use of alcohol, tobacco or illicit drugs  Current medications and supplements including opioid prescriptions. Functional ability and status Nutritional status Physical activity Advanced directives List of other physicians Hospitalizations, surgeries, and ER visits in previous 12  months Vitals Screenings to include cognitive, depression, and falls Referrals and appointments  No orders of the defined types were placed in this encounter.  In addition, I have reviewed and discussed with patient certain preventive protocols, quality metrics, and best practice recommendations. A written personalized care plan for preventive services as well as general preventive health recommendations were provided to patient.   Jhonnie GORMAN Das, LPN   [id number]/[id number]   No follow-ups on file.  After Visit Summary: (In Person-Declined) Patient declined AVS at this time.  Nurse Notes: UTD ON SHOTS, FLU SHOT GIVEN

## 2024-08-29 ENCOUNTER — Other Ambulatory Visit: Payer: Self-pay

## 2024-09-04 ENCOUNTER — Other Ambulatory Visit: Payer: Self-pay | Admitting: Internal Medicine

## 2024-09-04 DIAGNOSIS — E782 Mixed hyperlipidemia: Secondary | ICD-10-CM

## 2024-09-06 DIAGNOSIS — C760 Malignant neoplasm of head, face and neck: Secondary | ICD-10-CM | POA: Diagnosis not present

## 2024-09-06 DIAGNOSIS — C801 Malignant (primary) neoplasm, unspecified: Secondary | ICD-10-CM | POA: Diagnosis not present

## 2024-09-06 DIAGNOSIS — J3801 Paralysis of vocal cords and larynx, unilateral: Secondary | ICD-10-CM | POA: Diagnosis not present

## 2024-09-08 ENCOUNTER — Encounter: Payer: Self-pay | Admitting: Internal Medicine

## 2024-09-08 DIAGNOSIS — E782 Mixed hyperlipidemia: Secondary | ICD-10-CM

## 2024-09-08 MED ORDER — ATORVASTATIN CALCIUM 10 MG PO TABS: 10.0000 mg | ORAL_TABLET | Freq: Every day | ORAL | 1 refills | Status: AC

## 2024-09-08 NOTE — Telephone Encounter (Signed)
 Duplicate request, filled 09/08/24 #90 1RF Requested Prescriptions  Pending Prescriptions Disp Refills   atorvastatin  (LIPITOR) 10 MG tablet [Pharmacy Med Name: ATORVASTATIN  10 MG TABLET] 90 tablet 1    Sig: TAKE 1 TABLET BY MOUTH EVERY DAY     Cardiovascular:  Antilipid - Statins Failed - 09/08/2024 11:15 AM      Failed - Lipid Panel in normal range within the last 12 months    Cholesterol  Date Value Ref Range Status  09/12/2023 136 <200 mg/dL Final   LDL Cholesterol (Calc)  Date Value Ref Range Status  09/12/2023 64 mg/dL (calc) Final    Comment:    Reference range: <100 . Desirable range <100 mg/dL for primary prevention;   <70 mg/dL for patients with CHD or diabetic patients  with > or = 2 CHD risk factors. SABRA LDL-C is now calculated using the Martin-Hopkins  calculation, which is a validated novel method providing  better accuracy than the Friedewald equation in the  estimation of LDL-C.  [name]. [id number];689(80): 2061-2068  (http://education.[facility].com/faq/FAQ164)    HDL  Date Value Ref Range Status  09/12/2023 53 > OR = 50 mg/dL Final   Triglycerides  Date Value Ref Range Status  09/12/2023 100 <150 mg/dL Final         Passed - Patient is not pregnant      Passed - Valid encounter within last 12 months    Recent Outpatient Visits           5 months ago Laceration of left index finger without foreign body without damage to nail, subsequent encounter   [facility] [place], [place] W, NP   6 months ago Class 1 obesity due to excess calories with serious comorbidity and body mass index (BMI) of 30.0 to 30.9 in adult   [facility] [place], Angeline ORN, NP   8 years ago Annual physical exam   [facility] Elspeth DELENA, MD   8 years ago Cerumen impaction, bilateral   [facility] Million, MD   8 years ago Cough   [facility] Million, MD        Future Appointments             In 4 weeks Angela Lonni GRADE, MD [facility]

## 2024-09-13 ENCOUNTER — Ambulatory Visit: Admitting: Internal Medicine

## 2024-09-13 ENCOUNTER — Encounter: Payer: Self-pay | Admitting: Oncology

## 2024-09-13 ENCOUNTER — Encounter: Payer: Self-pay | Admitting: Internal Medicine

## 2024-09-13 VITALS — BP 124/58 | Ht 61.0 in | Wt 169.6 lb

## 2024-09-13 DIAGNOSIS — Z0001 Encounter for general adult medical examination with abnormal findings: Secondary | ICD-10-CM | POA: Diagnosis not present

## 2024-09-13 DIAGNOSIS — E66811 Obesity, class 1: Secondary | ICD-10-CM | POA: Diagnosis not present

## 2024-09-13 DIAGNOSIS — E782 Mixed hyperlipidemia: Secondary | ICD-10-CM | POA: Diagnosis not present

## 2024-09-13 DIAGNOSIS — Z6832 Body mass index (BMI) 32.0-32.9, adult: Secondary | ICD-10-CM

## 2024-09-13 DIAGNOSIS — E6609 Other obesity due to excess calories: Secondary | ICD-10-CM

## 2024-09-13 DIAGNOSIS — R7303 Prediabetes: Secondary | ICD-10-CM | POA: Diagnosis not present

## 2024-09-13 NOTE — Assessment & Plan Note (Signed)
 Encourage diet and exercise for weight loss

## 2024-09-13 NOTE — Progress Notes (Signed)
 Subjective:    Patient ID: [name], female    DOB: 09-Sep-1941, 83 y.o.   MRN: [id number]  HPI  Patient presents to clinic today for annual exam.  Flu: 08/2024 Tetanus: 09/2022 COVID: x 5 Pneumovax: 07/2007 Prevnar 20: 09/2022 Shingrix: 08/2018, 11/2018 Pap smear: No longer screening Mammogram: 10/2023 Bone density: 12/2020 Colon screening: 06/2021 Vision screening: annually Dentist: biannually  Diet: She does eat some meat. She consumes fruits and veggies. She does eat some fried foods. She drinks mostly coffee. Exercise: Walking   Review of Systems  Past Medical History:  Diagnosis Date   Allergy    Arthritis    Cancer (HCC)    History of chicken pox    History of colon polyps    History of mouth cancer    Osteoarthritis of knee    Medial compartment, Right    Current Outpatient Medications  Medication Sig Dispense Refill   acetaminophen  (TYLENOL ) 325 MG tablet Take by mouth. (Patient taking differently: Take by mouth. TAKES PRN)     aspirin  EC 81 MG tablet Take 81 mg by mouth daily.     atorvastatin  (LIPITOR) 10 MG tablet Take 1 tablet (10 mg total) by mouth daily. 90 tablet 1   Blood Glucose Monitoring Suppl DEVI Use to check blood sugar twice a day.  DX R73.03. May substitute to any manufacturer covered by patient's insurance. 1 each 0   Calcium  Carb-Cholecalciferol  600-800 MG-UNIT TABS Take 1 tablet by mouth 2 (two) times daily.      cetirizine (ZYRTEC) 10 MG tablet Take 10 mg by mouth daily.     clotrimazole -betamethasone  (LOTRISONE ) cream APPLY 1-2 TIMES A DAY AS NEEDED FOR SPOT TREATMENT FOR YEAST SKIN RASH 30 g 1   Cranberry (AZO CRANBERRY GUMMIES) 250 MG CHEW      D-Mannose 500 MG CAPS      esomeprazole  (NEXIUM ) 20 MG capsule TAKE 1 CAPSULE (20 MG TOTAL) BY MOUTH 2 (TWO) TIMES DAILY BEFORE A MEAL. 180 capsule 1   fluticasone  (FLONASE ) 50 MCG/ACT nasal spray Place 2 sprays into the nose at bedtime.      Glucosamine-Chondroitin (COSAMIN DS PO) Take 1  tablet by mouth 2 (two) times daily.     Lancets Misc. MISC Use to check blood sugar twice a day.  DX R73.03. May substitute to any manufacturer covered by patient's insurance. 100 each 0   lidocaine -prilocaine  (EMLA ) cream Apply to affected area once (Patient not taking: Reported on 08/27/2024) 30 g 3   Multiple Vitamins-Minerals (MULTIVITAMIN WITH MINERALS) tablet Take 1 tablet by mouth every evening. Women's One-A-Day     nystatin  (MYCOSTATIN /NYSTOP ) powder APPLY TO AFFECTED AREA 3 TIMES A DAY 30 g 1   Omega-3 Fatty Acids (FISH OIL) 1000 MG CAPS Take 1,000 mg by mouth daily.     ondansetron  (ZOFRAN ) 8 MG tablet Take 1 tablet (8 mg total) by mouth every 8 (eight) hours as needed for nausea or vomiting. 30 tablet 1   ONETOUCH VERIO test strip USE TO CHECK BLOOD SUGAR TWICE A DAY. DX R73.03. 100 strip 1   prochlorperazine  (COMPAZINE ) 10 MG tablet Take 1 tablet (10 mg total) by mouth every 6 (six) hours as needed for nausea or vomiting. (Patient not taking: Reported on 08/27/2024) 30 tablet 1   TURMERIC PO Take 538 mg by mouth daily.     No current facility-administered medications for this visit.    Allergies  Allergen Reactions   Ibuprofen Nausea And Vomiting and Other (See  Comments)    GI upset, GERD   Azithromycin  Hives    Itching, blotchy rash    Macrobid [Nitrofurantoin] Nausea And Vomiting    Family History  Problem Relation Age of Onset   Breast cancer Neg Hx     Social History   Socioeconomic History   Marital status: Married    Spouse name: Kortne All   Number of children: 2   Years of education: Not on file   Highest education level: 12th grade  Occupational History   Occupation: Retired  Tobacco Use   Smoking status: Former    Current packs/day: 0.00    Types: Cigarettes    Quit date: 12/28/1986    Years since quitting: 37.7   Smokeless tobacco: Never  Vaping Use   Vaping status: Never Used  Substance and Sexual Activity   Alcohol use: No   Drug use: No    Sexual activity: Not Currently  Other Topics Concern   Not on file  Social History Narrative   Not on file   Social Drivers of Health   Financial Resource Strain: Low Risk  (08/23/2024)   Overall Financial Resource Strain (CARDIA)    Difficulty of Paying Living Expenses: Not hard at all  Food Insecurity: No Food Insecurity (08/27/2024)   Hunger Vital Sign    Worried About Running Out of Food in the Last Year: Never true    Ran Out of Food in the Last Year: Never true  Transportation Needs: No Transportation Needs (08/27/2024)   PRAPARE - Administrator, Civil Service (Medical): No    Lack of Transportation (Non-Medical): No  Physical Activity: Insufficiently Active (08/27/2024)   Exercise Vital Sign    Days of Exercise per Week: 3 days    Minutes of Exercise per Session: 30 min  Stress: No Stress Concern Present (08/27/2024)   [name]    Feeling of Stress: Not at all  Social Connections: Socially Integrated (08/27/2024)   Social Connection and Isolation Panel    Frequency of Communication with Friends and Family: More than three times a week    Frequency of Social Gatherings with Friends and Family: Once a week    Attends Religious Services: More than 4 times per year    Active Member of [employer] or Organizations: Yes    Attends Engineer, Structural: More than 4 times per year    Marital Status: Married  Catering Manager Violence: Not At Risk (08/27/2024)   Humiliation, Afraid, Rape, and Kick questionnaire    Fear of Current or Ex-Partner: No    Emotionally Abused: No    Physically Abused: No    Sexually Abused: No     Constitutional: Denies fever, malaise, fatigue, headache or abrupt weight changes.  HEENT: Denies eye pain, eye redness, ear pain, ringing in the ears, wax buildup, runny nose, nasal congestion, bloody nose, or sore throat. Respiratory: Denies difficulty breathing,  shortness of breath, cough or sputum production.   Cardiovascular: Denies chest pain, chest tightness, palpitations or swelling in the hands or feet.  Gastrointestinal: Denies abdominal pain, bloating, constipation, diarrhea or blood in the stool.  GU: Pt reports urinary frequency. Denies urgency, pain with urination, burning sensation, blood in urine, odor or discharge. Musculoskeletal: Patient reports joint pain.  Denies decrease in range of motion, difficulty with gait, muscle pain or joint swelling.  Skin: Denies redness, rashes, or ulcercations.  Neurological: Denies dizziness, difficulty with memory, difficulty with  speech or problems with balance and coordination.  Psych: Denies anxiety, depression, SI/HI.  No other specific complaints in a complete review of systems (except as listed in HPI above).     Objective:   Physical Exam BP (!) 124/58 (BP Location: Right Arm, Patient Position: Sitting, Cuff Size: Normal)   Ht 5' 1 (1.549 m)   Wt 169 lb 9.6 oz (76.9 kg)   LMP  (LMP Unknown)   BMI 32.05 kg/m     Wt Readings from Last 3 Encounters:  08/27/24 170 lb 9.6 oz (77.4 kg)  08/25/24 169 lb 11.2 oz (77 kg)  08/04/24 170 lb 9.6 oz (77.4 kg)    General: Appears her stated age, obese, in NAD. Skin: Warm, dry and intact.  HEENT: Head: normal shape and size; Eyes: sclera white, no icterus, conjunctiva pink, PERRLA and EOMs intact;  Neck:  Neck supple, trachea midline. No masses, lumps or thyromegaly present.  Cardiovascular: Normal rate and rhythm. S1,S2 noted.  No murmur, rubs or gallops noted. No JVD or BLE edema. No carotid bruits noted. Pulmonary/Chest: Normal effort and positive vesicular breath sounds. No respiratory distress. No wheezes, rales or ronchi noted.  Abdomen: Normal bowel sounds.  Musculoskeletal: Strength 5/5 BUE/BLE. No difficulty with gait.  Neurological: Alert and oriented. Cranial nerves II-XII grossly intact. Coordination normal.  Psychiatric: Mood and  affect normal. Behavior is normal. Judgment and thought content normal.    BMET    Component Value Date/Time   NA 138 08/25/2024 0836   K 4.0 08/25/2024 0836   CL 104 08/25/2024 0836   CO2 25 08/25/2024 0836   GLUCOSE 103 (H) 08/25/2024 0836   BUN 22 08/25/2024 0836   CREATININE 0.76 08/25/2024 0836   CREATININE 0.75 03/13/2023 0832   CALCIUM  8.9 08/25/2024 0836   GFRNONAA >60 08/25/2024 0836   GFRNONAA >89 06/24/2016 0853   GFRAA >60 06/16/2019 0337   GFRAA >89 06/24/2016 0853    Lipid Panel     Component Value Date/Time   CHOL 136 09/12/2023 0906   TRIG 100 09/12/2023 0906   HDL 53 09/12/2023 0906   CHOLHDL 2.6 09/12/2023 0906   VLDL 30.4 08/22/2020 1211   LDLCALC 64 09/12/2023 0906    CBC    Component Value Date/Time   WBC 5.9 08/25/2024 0836   WBC 7.0 03/13/2023 0832   RBC 4.53 08/25/2024 0836   HGB 13.5 08/25/2024 0836   HCT 40.8 08/25/2024 0836   PLT 157 08/25/2024 0836   MCV 90.1 08/25/2024 0836   MCV 86.6 06/24/2016 0909   MCH 29.8 08/25/2024 0836   MCHC 33.1 08/25/2024 0836   RDW 12.4 08/25/2024 0836   LYMPHSABS 2.2 08/25/2024 0836   MONOABS 0.5 08/25/2024 0836   EOSABS 0.2 08/25/2024 0836   BASOSABS 0.0 08/25/2024 0836    Hgb A1C Lab Results  Component Value Date   HGBA1C 5.8 (H) 09/12/2023            Assessment & Plan:   Preventative Health Maintenance:  Flu shot UTD Tetanus UTD Prevnar 20 and Pneumovax UTD Shingrix UTD Encouraged her to get her COVID booster She no longer needs to screen for cervical cancer She no longer wants to screen for breast cancer She will discuss whether she needs a bone density with orthopedics or not Colon screening UTD Encourage her to consume a balanced diet and exercise regimen Advised her to see an eye doctor and dentist annually CBC, c-Met, lipid, A1c today  RTC in 6 months, follow-up  chronic conditions Angeline Laura, NP

## 2024-09-13 NOTE — Patient Instructions (Signed)
 Health Maintenance for Postmenopausal Women Menopause is a normal process in which your ability to get pregnant comes to an end. This process happens slowly over many months or years, usually between the ages of 76 and 38. Menopause is complete when you have missed your menstrual period for 12 months. It is important to talk with your health care provider about some of the most common conditions that affect women after menopause (postmenopausal women). These include heart disease, cancer, and bone loss (osteoporosis). Adopting a healthy lifestyle and getting preventive care can help to promote your health and wellness. The actions you take can also lower your chances of developing some of these common conditions. What are the signs and symptoms of menopause? During menopause, you may have the following symptoms: Hot flashes. These can be moderate or severe. Night sweats. Decrease in sex drive. Mood swings. Headaches. Tiredness (fatigue). Irritability. Memory problems. Problems falling asleep or staying asleep. Talk with your health care provider about treatment options for your symptoms. Do I need hormone replacement therapy? Hormone replacement therapy is effective in treating symptoms that are caused by menopause, such as hot flashes and night sweats. Hormone replacement carries certain risks, especially as you become older. If you are thinking about using estrogen or estrogen with progestin, discuss the benefits and risks with your health care provider. How can I reduce my risk for heart disease and stroke? The risk of heart disease, heart attack, and stroke increases as you age. One of the causes may be a change in the body's hormones during menopause. This can affect how your body uses dietary fats, triglycerides, and cholesterol. Heart attack and stroke are medical emergencies. There are many things that you can do to help prevent heart disease and stroke. Watch your blood pressure High  blood pressure causes heart disease and increases the risk of stroke. This is more likely to develop in people who have high blood pressure readings or are overweight. Have your blood pressure checked: Every 3-5 years if you are 32-23 years of age. Every year if you are 31 years old or older. Eat a healthy diet  Eat a diet that includes plenty of vegetables, fruits, low-fat dairy products, and lean protein. Do not eat a lot of foods that are high in solid fats, added sugars, or sodium. Get regular exercise Get regular exercise. This is one of the most important things you can do for your health. Most adults should: Try to exercise for at least 150 minutes each week. The exercise should increase your heart rate and make you sweat (moderate-intensity exercise). Try to do strengthening exercises at least twice each week. Do these in addition to the moderate-intensity exercise. Spend less time sitting. Even light physical activity can be beneficial. Other tips Work with your health care provider to achieve or maintain a healthy weight. Do not use any products that contain nicotine or tobacco. These products include cigarettes, chewing tobacco, and vaping devices, such as e-cigarettes. If you need help quitting, ask your health care provider. Know your numbers. Ask your health care provider to check your cholesterol and your blood sugar (glucose). Continue to have your blood tested as directed by your health care provider. Do I need screening for cancer? Depending on your health history and family history, you may need to have cancer screenings at different stages of your life. This may include screening for: Breast cancer. Cervical cancer. Lung cancer. Colorectal cancer. What is my risk for osteoporosis? After menopause, you may be  at increased risk for osteoporosis. Osteoporosis is a condition in which bone destruction happens more quickly than new bone creation. To help prevent osteoporosis or  the bone fractures that can happen because of osteoporosis, you may take the following actions: If you are 24-54 years old, get at least 1,000 mg of calcium and at least 600 international units (IU) of vitamin D  per day. If you are older than age 75 but younger than age 30, get at least 1,200 mg of calcium and at least 600 international units (IU) of vitamin D  per day. If you are older than age 8, get at least 1,200 mg of calcium and at least 800 international units (IU) of vitamin D  per day. Smoking and drinking excessive alcohol increase the risk of osteoporosis. Eat foods that are rich in calcium and vitamin D , and do weight-bearing exercises several times each week as directed by your health care provider. How does menopause affect my mental health? Depression may occur at any age, but it is more common as you become older. Common symptoms of depression include: Feeling depressed. Changes in sleep patterns. Changes in appetite or eating patterns. Feeling an overall lack of motivation or enjoyment of activities that you previously enjoyed. Frequent crying spells. Talk with your health care provider if you think that you are experiencing any of these symptoms. General instructions See your health care provider for regular wellness exams and vaccines. This may include: Scheduling regular health, dental, and eye exams. Getting and maintaining your vaccines. These include: Influenza vaccine. Get this vaccine each year before the flu season begins. Pneumonia vaccine. Shingles vaccine. Tetanus, diphtheria, and pertussis (Tdap) booster vaccine. Your health care provider may also recommend other immunizations. Tell your health care provider if you have ever been abused or do not feel safe at home. Summary Menopause is a normal process in which your ability to get pregnant comes to an end. This condition causes hot flashes, night sweats, decreased interest in sex, mood swings, headaches, or lack  of sleep. Treatment for this condition may include hormone replacement therapy. Take actions to keep yourself healthy, including exercising regularly, eating a healthy diet, watching your weight, and checking your blood pressure and blood sugar levels. Get screened for cancer and depression. Make sure that you are up to date with all your vaccines. This information is not intended to replace advice given to you by your health care provider. Make sure you discuss any questions you have with your health care provider. Document Revised: 02/12/2021 Document Reviewed: 02/12/2021 Elsevier Patient Education  2024 [employer].

## 2024-09-14 ENCOUNTER — Ambulatory Visit: Payer: Self-pay | Admitting: Internal Medicine

## 2024-09-14 ENCOUNTER — Encounter: Payer: Self-pay | Admitting: Oncology

## 2024-09-14 LAB — COMPREHENSIVE METABOLIC PANEL WITH GFR
AG Ratio: 1.7 (calc) (ref 1.0–2.5)
ALT: 17 U/L (ref 6–29)
AST: 20 U/L (ref 10–35)
Albumin: 4.4 g/dL (ref 3.6–5.1)
Alkaline phosphatase (APISO): 60 U/L (ref 37–153)
BUN: 15 mg/dL (ref 7–25)
CO2: 29 mmol/L (ref 20–32)
Calcium: 9.5 mg/dL (ref 8.6–10.4)
Chloride: 102 mmol/L (ref 98–110)
Creat: 0.71 mg/dL (ref 0.60–0.95)
Globulin: 2.6 g/dL (ref 1.9–3.7)
Glucose, Bld: 106 mg/dL — ABNORMAL HIGH (ref 65–99)
Potassium: 4.4 mmol/L (ref 3.5–5.3)
Sodium: 139 mmol/L (ref 135–146)
Total Bilirubin: 0.5 mg/dL (ref 0.2–1.2)
Total Protein: 7 g/dL (ref 6.1–8.1)
eGFR: 84 mL/min/1.73m2 (ref >=60)

## 2024-09-14 LAB — CBC
HCT: 45.3 % (ref 35.9–46.0)
Hemoglobin: 14.8 g/dL (ref 11.7–15.5)
MCH: 29.6 pg (ref 27.0–33.0)
MCHC: 32.7 g/dL (ref 31.6–35.4)
MCV: 90.6 fL (ref 81.4–101.7)
MPV: 10.3 fL (ref 7.5–12.5)
Platelets: 188 Thousand/uL (ref 140–400)
RBC: 5 Million/uL (ref 3.80–5.10)
RDW: 12.3 % (ref 11.0–15.0)
WBC: 6.2 Thousand/uL (ref 3.8–10.8)

## 2024-09-14 LAB — LIPID PANEL
Cholesterol: 146 mg/dL (ref <200)
HDL: 52 mg/dL (ref >=50)
LDL Cholesterol (Calc): 73 mg/dL
Non-HDL Cholesterol (Calc): 94 mg/dL (ref <130)
Total CHOL/HDL Ratio: 2.8 (calc) (ref <5.0)
Triglycerides: 129 mg/dL (ref <150)

## 2024-09-14 LAB — HEMOGLOBIN A1C
Hgb A1c MFr Bld: 5.7 % — ABNORMAL HIGH (ref <5.7)
Mean Plasma Glucose: 117 mg/dL
eAG (mmol/L): 6.5 mmol/L

## 2024-09-17 ENCOUNTER — Other Ambulatory Visit: Payer: Self-pay | Admitting: Internal Medicine

## 2024-09-17 DIAGNOSIS — Z1231 Encounter for screening mammogram for malignant neoplasm of breast: Secondary | ICD-10-CM

## 2024-10-04 ENCOUNTER — Encounter: Payer: Self-pay | Admitting: Internal Medicine

## 2024-10-06 ENCOUNTER — Encounter: Payer: Self-pay | Admitting: Orthopaedic Surgery

## 2024-10-06 ENCOUNTER — Other Ambulatory Visit (INDEPENDENT_AMBULATORY_CARE_PROVIDER_SITE_OTHER)

## 2024-10-06 ENCOUNTER — Ambulatory Visit: Admitting: Orthopaedic Surgery

## 2024-10-06 DIAGNOSIS — M25561 Pain in right knee: Secondary | ICD-10-CM

## 2024-10-06 DIAGNOSIS — G8929 Other chronic pain: Secondary | ICD-10-CM

## 2024-10-06 DIAGNOSIS — Z96651 Presence of right artificial knee joint: Secondary | ICD-10-CM

## 2024-10-06 MED ORDER — METHYLPREDNISOLONE ACETATE 40 MG/ML IJ SUSP
40.0000 mg | INTRAMUSCULAR | Status: AC | PRN
  Administered 2024-10-06: 40 mg via INTRA_ARTICULAR

## 2024-10-06 MED ORDER — LIDOCAINE HCL 1 % IJ SOLN
3.0000 mL | INTRAMUSCULAR | Status: AC | PRN
  Administered 2024-10-06: 3 mL

## 2024-10-06 NOTE — Progress Notes (Signed)
 The patient is an 83 year old female who is now over 5 years out from a revision arthroplasty.  She originally had a partial knee replacement on her right knee at the medial compartment of the knee and that eventually was revised to a total knee arthroplasty.  She comes in today with some pain on the medial aspects of her knee but more of just soreness with walking.  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  She denies any fever and chills.  Examination of her right knee shows no warmth to the knee.  Her range of motion is full and the knee is loosely stable.  There is some mild tenderness medial and lateral but nothing worrisome on exam.  X-rays of the left knee show well-seated total knee arthroplasty with no complicating features.  I did offer a one-time steroid injection in the knee which I think would help her and she agreed to this and tolerated well.  Also would have recommended a compression knee sleeve which would be a copper fit type knee sleeve to see if this would help.  Thus far I have no other recommendations and she seems satisfied with what we are trying to do to get her knee feel better when she walks.  If things worsen she knows to reach out and come see us .    Procedure Note  Patient: [name]             Date of Birth: 11-Jun-1941           MRN: [id number]             Visit Date: 10/06/2024  Procedures: Visit Diagnoses:  1. History of total right knee replacement     Large Joint Inj: R knee on 10/06/2024 8:42 AM Indications: diagnostic evaluation and pain Details: 22 G 1.5 in needle, superolateral approach  Arthrogram: No  Medications: 3 mL lidocaine  1 %; 40 mg methylPREDNISolone  acetate 40 MG/ML Outcome: tolerated well, no immediate complications Procedure, treatment alternatives, risks and benefits explained, specific risks discussed. Consent was given by the patient. Immediately prior to procedure a time out was called to  verify the correct patient, procedure, equipment, support staff and site/side marked as required. Patient was prepped and draped in the usual sterile fashion.

## 2024-10-15 ENCOUNTER — Ambulatory Visit
Admission: RE | Admit: 2024-10-15 | Discharge: 2024-10-15 | Disposition: A | Source: Ambulatory Visit | Attending: Internal Medicine | Admitting: Internal Medicine

## 2024-10-15 DIAGNOSIS — Z1231 Encounter for screening mammogram for malignant neoplasm of breast: Secondary | ICD-10-CM

## 2024-12-22 ENCOUNTER — Other Ambulatory Visit

## 2025-01-11 ENCOUNTER — Inpatient Hospital Stay

## 2025-01-11 ENCOUNTER — Inpatient Hospital Stay: Admitting: Oncology

## 2025-02-09 ENCOUNTER — Inpatient Hospital Stay

## 2025-02-09 ENCOUNTER — Inpatient Hospital Stay: Admitting: Oncology

## 2025-03-15 ENCOUNTER — Ambulatory Visit: Admitting: Internal Medicine

## 2025-08-31 ENCOUNTER — Ambulatory Visit
